# Patient Record
Sex: Male | Born: 1965
Health system: Southern US, Community
[De-identification: ages and names within clinical notes are randomized; demographics above are authoritative.]

## PROBLEM LIST (undated history)

## (undated) DIAGNOSIS — R29898 Other symptoms and signs involving the musculoskeletal system: Secondary | ICD-10-CM

## (undated) DIAGNOSIS — F419 Anxiety disorder, unspecified: Secondary | ICD-10-CM

## (undated) DIAGNOSIS — Z87898 Personal history of other specified conditions: Secondary | ICD-10-CM

## (undated) DIAGNOSIS — R51 Headache: Secondary | ICD-10-CM

## (undated) DIAGNOSIS — Z8719 Personal history of other diseases of the digestive system: Secondary | ICD-10-CM

## (undated) DIAGNOSIS — H5702 Anisocoria: Secondary | ICD-10-CM

## (undated) DIAGNOSIS — K922 Gastrointestinal hemorrhage, unspecified: Secondary | ICD-10-CM

## (undated) DIAGNOSIS — M1612 Unilateral primary osteoarthritis, left hip: Secondary | ICD-10-CM

## (undated) DIAGNOSIS — F32A Depression, unspecified: Secondary | ICD-10-CM

## (undated) DIAGNOSIS — Z8659 Personal history of other mental and behavioral disorders: Secondary | ICD-10-CM

## (undated) DIAGNOSIS — R112 Nausea with vomiting, unspecified: Secondary | ICD-10-CM

## (undated) DIAGNOSIS — M25552 Pain in left hip: Secondary | ICD-10-CM

## (undated) DIAGNOSIS — M1990 Unspecified osteoarthritis, unspecified site: Secondary | ICD-10-CM

## (undated) DIAGNOSIS — Z9889 Other specified postprocedural states: Secondary | ICD-10-CM

## (undated) DIAGNOSIS — M79642 Pain in left hand: Secondary | ICD-10-CM

## (undated) DIAGNOSIS — Z972 Presence of dental prosthetic device (complete) (partial): Secondary | ICD-10-CM

## (undated) DIAGNOSIS — F329 Major depressive disorder, single episode, unspecified: Secondary | ICD-10-CM

## (undated) DIAGNOSIS — F1290 Cannabis use, unspecified, uncomplicated: Secondary | ICD-10-CM

## (undated) DIAGNOSIS — I10 Essential (primary) hypertension: Secondary | ICD-10-CM

## (undated) DIAGNOSIS — M5030 Other cervical disc degeneration, unspecified cervical region: Secondary | ICD-10-CM

## (undated) DIAGNOSIS — G8929 Other chronic pain: Secondary | ICD-10-CM

## (undated) DIAGNOSIS — Z87828 Personal history of other (healed) physical injury and trauma: Secondary | ICD-10-CM

## (undated) DIAGNOSIS — F1120 Opioid dependence, uncomplicated: Secondary | ICD-10-CM

## (undated) DIAGNOSIS — J449 Chronic obstructive pulmonary disease, unspecified: Secondary | ICD-10-CM

## (undated) HISTORY — PX: KNEE SURGERY: SHX244

## (undated) HISTORY — PX: EYE SURGERY: SHX253

## (undated) HISTORY — PX: OTHER SURGICAL HISTORY: SHX169

## (undated) HISTORY — DX: Anisocoria: H57.02

## (undated) NOTE — *Deleted (*Deleted)
Provided patient with homelessness resources and food pantries in the [place] area.  Patient was also provided a bus pass.  Updated EDP with plan

---
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
## 2007-07-28 ENCOUNTER — Emergency Department (HOSPITAL_COMMUNITY): Admission: EM | Admit: 2007-07-28 | Discharge: 2007-07-28 | Payer: Self-pay | Admitting: Emergency Medicine

## 2008-04-17 ENCOUNTER — Emergency Department (HOSPITAL_COMMUNITY): Admission: EM | Admit: 2008-04-17 | Discharge: 2008-04-17 | Payer: Self-pay | Admitting: Emergency Medicine

## 2008-07-27 ENCOUNTER — Emergency Department (HOSPITAL_COMMUNITY): Admission: EM | Admit: 2008-07-27 | Discharge: 2008-07-27 | Payer: Self-pay | Admitting: Emergency Medicine

## 2009-01-11 ENCOUNTER — Emergency Department (HOSPITAL_COMMUNITY): Admission: EM | Admit: 2009-01-11 | Discharge: 2009-01-12 | Payer: Self-pay | Admitting: Emergency Medicine

## 2009-01-12 ENCOUNTER — Ambulatory Visit: Payer: Self-pay | Admitting: Psychiatry

## 2009-01-12 ENCOUNTER — Inpatient Hospital Stay (HOSPITAL_COMMUNITY): Admission: AD | Admit: 2009-01-12 | Discharge: 2009-01-13 | Payer: Self-pay | Admitting: Psychiatry

## 2009-06-15 ENCOUNTER — Emergency Department (HOSPITAL_COMMUNITY): Admission: EM | Admit: 2009-06-15 | Discharge: 2009-06-15 | Payer: Self-pay | Admitting: Emergency Medicine

## 2009-09-25 ENCOUNTER — Emergency Department (HOSPITAL_COMMUNITY): Admission: EM | Admit: 2009-09-25 | Discharge: 2009-09-25 | Payer: Self-pay | Admitting: Emergency Medicine

## 2009-10-18 ENCOUNTER — Emergency Department (HOSPITAL_COMMUNITY): Admission: EM | Admit: 2009-10-18 | Discharge: 2009-10-18 | Payer: Self-pay | Admitting: Emergency Medicine

## 2009-10-22 ENCOUNTER — Emergency Department (HOSPITAL_COMMUNITY): Admission: EM | Admit: 2009-10-22 | Discharge: 2009-10-22 | Payer: Self-pay | Admitting: Emergency Medicine

## 2009-11-10 ENCOUNTER — Emergency Department (HOSPITAL_BASED_OUTPATIENT_CLINIC_OR_DEPARTMENT_OTHER): Admission: EM | Admit: 2009-11-10 | Discharge: 2009-11-10 | Payer: Self-pay | Admitting: Emergency Medicine

## 2009-12-09 ENCOUNTER — Emergency Department (HOSPITAL_COMMUNITY): Admission: EM | Admit: 2009-12-09 | Discharge: 2009-12-09 | Payer: Self-pay | Admitting: Emergency Medicine

## 2009-12-12 ENCOUNTER — Emergency Department (HOSPITAL_COMMUNITY): Admission: EM | Admit: 2009-12-12 | Discharge: 2009-12-12 | Payer: Self-pay | Admitting: Emergency Medicine

## 2009-12-13 ENCOUNTER — Emergency Department (HOSPITAL_COMMUNITY): Admission: EM | Admit: 2009-12-13 | Discharge: 2009-12-13 | Payer: Self-pay | Admitting: Emergency Medicine

## 2010-01-06 ENCOUNTER — Emergency Department (HOSPITAL_COMMUNITY): Admission: EM | Admit: 2010-01-06 | Discharge: 2010-01-06 | Payer: Self-pay | Admitting: Family Medicine

## 2010-01-15 ENCOUNTER — Emergency Department (HOSPITAL_COMMUNITY): Admission: EM | Admit: 2010-01-15 | Discharge: 2010-01-15 | Payer: Self-pay | Admitting: Emergency Medicine

## 2010-02-08 ENCOUNTER — Emergency Department (HOSPITAL_COMMUNITY): Admission: EM | Admit: 2010-02-08 | Discharge: 2010-02-08 | Payer: Self-pay | Admitting: Emergency Medicine

## 2010-03-23 ENCOUNTER — Emergency Department (HOSPITAL_COMMUNITY): Admission: EM | Admit: 2010-03-23 | Discharge: 2010-03-23 | Payer: Self-pay | Admitting: Emergency Medicine

## 2010-04-08 ENCOUNTER — Emergency Department (HOSPITAL_COMMUNITY): Admission: EM | Admit: 2010-04-08 | Discharge: 2010-04-08 | Payer: Self-pay | Admitting: Emergency Medicine

## 2010-04-18 ENCOUNTER — Emergency Department (HOSPITAL_COMMUNITY): Admission: EM | Admit: 2010-04-18 | Discharge: 2010-04-18 | Payer: Self-pay | Admitting: Emergency Medicine

## 2010-04-19 ENCOUNTER — Ambulatory Visit: Payer: Self-pay | Admitting: Psychiatry

## 2010-04-19 ENCOUNTER — Inpatient Hospital Stay (HOSPITAL_COMMUNITY): Admission: AD | Admit: 2010-04-19 | Discharge: 2010-04-20 | Payer: Self-pay | Admitting: Psychiatry

## 2010-04-19 ENCOUNTER — Emergency Department (HOSPITAL_COMMUNITY): Admission: EM | Admit: 2010-04-19 | Discharge: 2010-04-19 | Payer: Self-pay | Admitting: Emergency Medicine

## 2010-06-18 ENCOUNTER — Emergency Department (HOSPITAL_COMMUNITY): Admission: EM | Admit: 2010-06-18 | Discharge: 2010-06-18 | Payer: Self-pay | Admitting: Family Medicine

## 2010-07-01 ENCOUNTER — Emergency Department (HOSPITAL_COMMUNITY): Admission: EM | Admit: 2010-07-01 | Discharge: 2010-07-01 | Payer: Self-pay | Admitting: Emergency Medicine

## 2010-08-03 ENCOUNTER — Emergency Department: Payer: Self-pay | Admitting: Emergency Medicine

## 2010-08-14 ENCOUNTER — Emergency Department (HOSPITAL_COMMUNITY): Admission: EM | Admit: 2010-08-14 | Discharge: 2010-08-14 | Payer: Self-pay | Admitting: Emergency Medicine

## 2010-08-21 ENCOUNTER — Emergency Department (HOSPITAL_COMMUNITY): Admission: EM | Admit: 2010-08-21 | Discharge: 2010-08-21 | Payer: Self-pay | Admitting: Family Medicine

## 2010-09-01 ENCOUNTER — Ambulatory Visit (HOSPITAL_COMMUNITY)
Admission: RE | Admit: 2010-09-01 | Discharge: 2010-09-01 | Payer: Self-pay | Source: Home / Self Care | Admitting: Orthopaedic Surgery

## 2010-10-26 ENCOUNTER — Ambulatory Visit
Admission: RE | Admit: 2010-10-26 | Discharge: 2010-10-26 | Payer: Self-pay | Source: Home / Self Care | Attending: Nurse Practitioner | Admitting: Nurse Practitioner

## 2010-10-26 ENCOUNTER — Encounter (INDEPENDENT_AMBULATORY_CARE_PROVIDER_SITE_OTHER): Payer: Self-pay | Admitting: Internal Medicine

## 2010-10-26 DIAGNOSIS — R519 Headache, unspecified: Secondary | ICD-10-CM | POA: Insufficient documentation

## 2010-10-26 DIAGNOSIS — I10 Essential (primary) hypertension: Secondary | ICD-10-CM | POA: Insufficient documentation

## 2010-10-26 DIAGNOSIS — J309 Allergic rhinitis, unspecified: Secondary | ICD-10-CM | POA: Insufficient documentation

## 2010-10-26 DIAGNOSIS — R51 Headache: Secondary | ICD-10-CM | POA: Insufficient documentation

## 2010-10-26 DIAGNOSIS — M25569 Pain in unspecified knee: Secondary | ICD-10-CM | POA: Insufficient documentation

## 2010-10-26 LAB — CONVERTED CEMR LAB
CO2: 28 meq/L (ref 19–32)
Calcium: 9.7 mg/dL (ref 8.4–10.5)
Chloride: 103 meq/L (ref 96–112)
Glucose, Bld: 82 mg/dL (ref 70–99)
Sodium: 139 meq/L (ref 135–145)

## 2010-10-29 ENCOUNTER — Ambulatory Visit: Admit: 2010-10-29 | Payer: Self-pay | Admitting: Internal Medicine

## 2010-11-04 ENCOUNTER — Telehealth (INDEPENDENT_AMBULATORY_CARE_PROVIDER_SITE_OTHER): Payer: Self-pay | Admitting: Internal Medicine

## 2010-11-04 NOTE — Letter (Signed)
Summary: [employer]ED [employer]ANCE  [employer]   Imported By: Arta Bruce 10/26/2010 14:55:50  _____________________________________________________________________  External Attachment:    Type:   Image     Comment:   External Document

## 2010-11-18 NOTE — Progress Notes (Signed)
Summary: Side effects from labetolol  Phone Note Call from Patient   Summary of Call: pt is having muscle cramps with pain and feeling tired.... pt had a nose bleed... pt just started Labetol (bp med) and he wants to know if the med is the cause of all his symptoms Initial call taken by: [place] Shannon,  November 04, 2010 11:03 AM  Follow-up for Phone Call        Labetolol Rx'd on 1/24.  Side effects include unusual fatigue and bleeding, muscle pain.  Please advise. Left message on answering machine for pt. to return call.  Dutch Quint RN  November 04, 2010 2:46 PM   Additional Follow-up for Phone Call Additional follow up Details #1::        The fatigue, yes--but that should improve over a bit more time.   The nose bleed --unlikely. Need more info on the muscle pain--where on body, when did it start, when does he have it.  What sets it off. Additional Follow-up by: Julieanne Manson MD,  November 05, 2010 6:12 PM    Additional Follow-up for Phone Call Additional follow up Details #2::    Muscle pain in buttocks radiating down to feet 8/10,  hurts worse when relaxed, better when moving around. Pain started after going bowling several months ago. At night 'have to keep my legs moving" if he doesn't keep them moving they will cramp up. Hands "going to sleep" worse when he wakes up in am but does happen when he plays his guitar. Currently taking 2 Percocets in am, 1 Noon, 2 in PM 5 per day. Requesting something stronger or increase the amout of pills. Has been checking BP @ [facility] 168/90, 130/89 yesterday. Advised to decrease salt intake, follow a low fat diet, exercise. Will forward to Dr. Delrae Alfred  pt called to speak with you he states he has more concerns.Marland KitchenMarland KitchenArmenia Shannon  November 09, 2010 10:20 AM  This morning he took last two of pain medication, had some major issues last night with arms, hands, fingers -- feel very badly swollen, but do not appear that swollen.  Legs and buttocks  are badly hurting as well, could hardly get out of bed this morning.  Lost control of his bowels this morning.  Is also having some SOB, still smokes.  Is worried about health status -- got scared when lost control this morning.  Is taking his medications exactly as prescribed.  Sent to Dr. Delrae Alfred.  Dutch Quint RN  November 09, 2010 12:02 PM  He's having a lot of pain, is out of his medication.  Has been trying everything for pain relief without his meds, no relief at all.  Would appreciate refill of pain medication today if possible.    Follow-up by: Dutch Quint RN,  November 10, 2010 10:24 AM  Additional Follow-up for Phone Call Additional follow up Details #3:: Details for Additional Follow-up Action Taken: Pain meds filled 10/26/2010 - pt has not been taking as ordered.  will not approve refill.  will need to wait until Dr. Delrae Alfred reviews on tomorrow for a decision n.martin,fnp  November 10, 2010  11:52 AM  Pt. and wife advised of response -- will send to Dr. Delrae Alfred.  Dutch Quint RN  November 10, 2010 3:45 PM  Pt. called again today -- Needs more pain med. -- Went over the "Agreement for Controlled Substance Presciption" again w/pt. -- Told him that he is to take  his pain meds as directed and that the provider will not refill any pain meds if lost or finished early. -- He is taking 2 pill in the moring, 1 pill at lunch time, and 2 pills in the evening.  -- Adv. pt. that Dr. Delrae Alfred will look over the notes and to wait for our call. -- Pt. agreed and will wait. -- Hale Drone CMA  November 11, 2010 12:48 PM  His percocet must last the entire month--no refills until then. Keep taking the Labetalol Will discuss above discomforts at his follow up beginning of March. Let him know his labs were fine. Julieanne Manson MD  November 12, 2010 8:42 AM   Pt. notified. Gaylyn Cheers RN  November 12, 2010 3:12 PM

## 2010-11-22 ENCOUNTER — Telehealth (INDEPENDENT_AMBULATORY_CARE_PROVIDER_SITE_OTHER): Payer: Self-pay | Admitting: Internal Medicine

## 2010-11-22 DIAGNOSIS — R21 Rash and other nonspecific skin eruption: Secondary | ICD-10-CM | POA: Insufficient documentation

## 2010-11-30 NOTE — Progress Notes (Signed)
Summary: Refill on his Percocet  Phone Note Call from Patient Call back at Home Phone [phone]   Reason for Call: Refill Medication Summary of Call: Trevor Thomas Pt. Trevor Thomas called and wants to know if he can get a refill on his Percocet. He is in a lot of pain and his next appt. is not until March 6th. Initial call taken by: Leodis Rains,  November 22, 2010 3:45 PM  Follow-up for Phone Call        As per previous notes, will refill every 30 days only. Will fill end of this week.  Can pick up Friday morning. Follow-up by: Julieanne Manson MD,  November 24, 2010 12:31 PM    Prescriptions: PERCOCET 5-325 MG TABS (OXYCODONE-ACETAMINOPHEN) 1 tab by mouth every 6 hours as needed for pain  #60 x 0   Entered and Authorized by:   Julieanne Manson MD   Signed by:   Julieanne Manson MD on 11/25/2010   Method used:   Print then Give to Patient   RxID:   [id number]
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
## 2010-12-07 ENCOUNTER — Encounter (INDEPENDENT_AMBULATORY_CARE_PROVIDER_SITE_OTHER): Payer: Self-pay | Admitting: Internal Medicine

## 2010-12-07 ENCOUNTER — Encounter: Payer: Self-pay | Admitting: Internal Medicine

## 2010-12-07 DIAGNOSIS — M5412 Radiculopathy, cervical region: Secondary | ICD-10-CM | POA: Insufficient documentation

## 2010-12-07 DIAGNOSIS — M25519 Pain in unspecified shoulder: Secondary | ICD-10-CM | POA: Insufficient documentation

## 2010-12-09 ENCOUNTER — Telehealth (INDEPENDENT_AMBULATORY_CARE_PROVIDER_SITE_OTHER): Payer: Self-pay | Admitting: Internal Medicine

## 2010-12-14 NOTE — Progress Notes (Signed)
Summary: PT referral  Phone Note Outgoing Call   Summary of Call: Nora--I did not mark down if I had sent pt. for PT --printer was down and I think I filled out a referral paper--but cannot recall.  Would like to refer him to PT--please see order.  Thanks Initial call taken by: Julieanne Manson MD,  December 09, 2010 12:00 PM  Follow-up for Phone Call        SENT A REFERRAL TO [facility] THEY WILL CALL PT TO SCHEDULE AN APPT [address] PH # [phone]  WAITING FOR AN APPT.Marland KitchenCheryll Dessert  December 09, 2010 5:06 PM

## 2010-12-14 NOTE — Assessment & Plan Note (Signed)
Vital Signs:  Patient profile:   45 year old male Weight:      243.1 pounds Temp:     98.3 degrees F oral Pulse rate:   92 / minute Pulse rhythm:   regular Resp:     20 per minute BP sitting:   154 / 96  (left arm) Cuff size:   regular  Vitals Entered By: Levon Hedger (December 07, 2010 4:23 PM) CC: follow-up visit Is Patient Diabetic? No Pain Assessment Patient in pain? yes     Location: lower back Intensity: 8-9  Does patient need assistance? Functional Status Self care Ambulation Normal Comments pt did not bring medications today   CC:  follow-up visit.  History of Present Illness: 1.  Headaches:  still having headaches--maybe a bit better.  Is using reading glasses regularly--has helped.  Is using allergy medications, though still with some throat itching  and nasal congestion.    2.  Allergies:  nose and throat still with some symptoms.  Congestion for the former and itching for the latter.  Did not fill Nasonex as has a $10 copay with our pharmacy.    3.  Hypertension:  Stopped Labetalol--not sure how much of Labetalol he is taking as he states he is taking only 1/2 tab two times a day --could be that he was given 200 mg tabs, but did not bring with him.  Stopped the medication day before yesterday as was having left shoulder pain radiating down his arm-when lies on the shoulder, occurs.  Also happens on right shoulder.   4.  Shoulder pain:  as above.  When presses back of neck, gets tingling down both arms--tingling into hands  Gets pain and tingling when plays guitar as well.  Sounds like this has been a problem intermittently for a while and not just after starting Labetalol.  Allergies (verified): No Known Drug Allergies  Physical Exam  General:  NAD Eyes:  Conjunctivae without injection Ears:  TMs pearly gray Nose:  some clear discharge and nasal mucosal swelling. Mouth:  Throat without injection--not able to see posterior pharynx well. Neck:  No  deformities, masses, or tenderness noted. Lungs:  Normal respiratory effort, chest expands symmetrically. Lungs are clear to auscultation, no crackles or wheezes. Heart:  Normal rate and regular rhythm. S1 and S2 normal without gallop, murmur, click, rub or other extra sounds.  Radial pulses normal and equal Msk:  Tender over traps bilaterally and along cervical paraspinous musculature. Mild crepitation of right shoulder with abduction and flexion.  Full ROM of shoulders bilaterally, but does have discomfort at full abduction/flexion.  Tender over Right AC/CC joint, but not subacromial bursa. Neurologic:  Good grip bilaterallyalert & oriented X3, cranial nerves II-XII intact, strength normal in all extremities, sensation intact to pinprick, and DTRs symmetrical and normal.  Negative Tinel's over median nerve at ventral wrists bilaterally.   Impression & Recommendations:  Problem # 1:  CERVICAL RADICULOPATHY (ICD-723.4)  Xray of cervical spine and shoulders bilaterally--order written as printer nonfunctional.  Orders: Physical Therapy Referral (PT)  Problem # 2:  SHOULDER PAIN, BILATERAL (ICD-719.41)  Suspect some rotator cuff injury on right. His updated medication list for this problem includes:    Percocet 5-325 Mg Tabs (Oxycodone-acetaminophen) .Marland Kitchen... 1 tab by mouth every 6 hours as needed for pain  Orders: Physical Therapy Referral (PT)  Problem # 3:  HEADACHE (ICD-784.0) Again, multifactorial His updated medication list for this problem includes:    Labetalol Hcl 100 Mg  Tabs (Labetalol hcl) .Marland Kitchen... 1 tab by mouth two times a day    Percocet 5-325 Mg Tabs (Oxycodone-acetaminophen) .Marland Kitchen... 1 tab by mouth every 6 hours as needed for pain  Problem # 4:  ALLERGIC RHINITIS (ICD-477.9) To get started on Nasonex. His updated medication list for this problem includes:    Loratadine 10 Mg Tabs (Loratadine) .Marland Kitchen... 1 tab by mouth daily for allergies.    Nasonex 50 Mcg/act Susp (Mometasone  furoate) .Marland Kitchen... 2 sprays each nostril daily  Problem # 5:  HYPERTENSION (ICD-401.9) Discussed I do not believe his radicular symptoms are secondary to the Labetalol and he should never suddenly stop To restart--he is to make sure he is taking 100 mg two times a day --to bring in bottles next visit. His updated medication list for this problem includes:    Labetalol Hcl 100 Mg Tabs (Labetalol hcl) .Marland Kitchen... 1 tab by mouth two times a day  Complete Medication List: 1)  Loratadine 10 Mg Tabs (Loratadine) .Marland Kitchen.. 1 tab by mouth daily for allergies. 2)  Nasonex 50 Mcg/act Susp (Mometasone furoate) .... 2 sprays each nostril daily 3)  Labetalol Hcl 100 Mg Tabs (Labetalol hcl) .Marland Kitchen.. 1 tab by mouth two times a day 4)  Percocet 5-325 Mg Tabs (Oxycodone-acetaminophen) .Marland Kitchen.. 1 tab by mouth every 6 hours as needed for pain  Patient Instructions: 1)  Printer was down and follow up written--I believe to follow up in 4 weeks for BP check and 2-3 months for follow up of rest with Dr. Delrae Alfred Prescriptions: LABETALOL HCL 100 MG TABS (LABETALOL HCL) 1 tab by mouth two times a day  #60 x 6   Entered and Authorized by:   Julieanne Manson MD   Signed by:   Julieanne Manson MD on 12/07/2010   Method used:   Faxed to ...       [facility] - [facility] (retail)       [address] [place], [place]  [address]       Ph: [phone]       Fax: [phone]   RxID:   [id number]    Orders Added: 1)  Est. Patient Level IV [88416] 2)  Physical Therapy Referral [PT]

## 2010-12-16 LAB — DIFFERENTIAL
Basophils Absolute: 0 10*3/uL (ref 0.0–0.1)
Basophils Relative: 0 % (ref 0–1)
Eosinophils Relative: 1 % (ref 0–5)
Lymphocytes Relative: 29 % (ref 12–46)
Lymphs Abs: 2.6 10*3/uL (ref 0.7–4.0)
Monocytes Absolute: 0.6 10*3/uL (ref 0.1–1.0)
Neutrophils Relative %: 63 % (ref 43–77)

## 2010-12-16 LAB — CBC
HCT: 45.1 % (ref 39.0–52.0)
Hemoglobin: 15.9 g/dL (ref 13.0–17.0)
MCH: 32.1 pg (ref 26.0–34.0)
MCHC: 35.3 g/dL (ref 30.0–36.0)
MCV: 90.9 fL (ref 78.0–100.0)
RBC: 4.96 MIL/uL (ref 4.22–5.81)

## 2010-12-16 LAB — BASIC METABOLIC PANEL
CO2: 27 mEq/L (ref 19–32)
Calcium: 9.2 mg/dL (ref 8.4–10.5)
Chloride: 106 mEq/L (ref 96–112)
Glucose, Bld: 117 mg/dL — ABNORMAL HIGH (ref 70–99)
Potassium: 3.9 mEq/L (ref 3.5–5.1)
Sodium: 138 mEq/L (ref 135–145)

## 2010-12-18 LAB — POCT I-STAT, CHEM 8
Hemoglobin: 17 g/dL (ref 13.0–17.0)
Sodium: 141 mEq/L (ref 135–145)
TCO2: 23 mmol/L (ref 0–100)

## 2010-12-18 LAB — RAPID URINE DRUG SCREEN, HOSP PERFORMED
Amphetamines: NOT DETECTED
Tetrahydrocannabinol: POSITIVE — AB

## 2010-12-18 LAB — URINALYSIS, ROUTINE W REFLEX MICROSCOPIC
Bilirubin Urine: NEGATIVE
Hgb urine dipstick: NEGATIVE
Protein, ur: NEGATIVE mg/dL
Urobilinogen, UA: 0.2 mg/dL (ref 0.0–1.0)

## 2010-12-18 LAB — SALICYLATE LEVEL: Salicylate Lvl: <4 mg/dL (ref 2.8–20.0)

## 2010-12-18 LAB — ETHANOL: Alcohol, Ethyl (B): 137 mg/dL — ABNORMAL HIGH (ref 0–10)

## 2010-12-19 LAB — URINALYSIS, ROUTINE W REFLEX MICROSCOPIC
Glucose, UA: NEGATIVE mg/dL
Hgb urine dipstick: NEGATIVE
Ketones, ur: 15 mg/dL — AB
Protein, ur: 100 mg/dL — AB

## 2010-12-19 LAB — URINE MICROSCOPIC-ADD ON

## 2010-12-19 LAB — COMPREHENSIVE METABOLIC PANEL
ALT: 38 U/L (ref 0–53)
AST: 30 U/L (ref 0–37)
CO2: 23 mEq/L (ref 19–32)
Calcium: 10.3 mg/dL (ref 8.4–10.5)
Chloride: 102 mEq/L (ref 96–112)
GFR calc Af Amer: >60 mL/min (ref >60)
GFR calc non Af Amer: >60 mL/min (ref >60)
Sodium: 140 mEq/L (ref 135–145)

## 2010-12-19 LAB — CBC
MCHC: 34.6 g/dL (ref 30.0–36.0)
RBC: 5.47 MIL/uL (ref 4.22–5.81)
WBC: 11.7 10*3/uL — ABNORMAL HIGH (ref 4.0–10.5)

## 2010-12-19 LAB — DIFFERENTIAL
Eosinophils Absolute: 0 10*3/uL (ref 0.0–0.7)
Eosinophils Relative: 0 % (ref 0–5)
Lymphs Abs: 2.5 10*3/uL (ref 0.7–4.0)
Monocytes Absolute: 0.8 10*3/uL (ref 0.1–1.0)

## 2010-12-20 ENCOUNTER — Telehealth (INDEPENDENT_AMBULATORY_CARE_PROVIDER_SITE_OTHER): Payer: Self-pay | Admitting: Internal Medicine

## 2010-12-21 LAB — URINALYSIS, ROUTINE W REFLEX MICROSCOPIC
Ketones, ur: NEGATIVE mg/dL
Protein, ur: NEGATIVE mg/dL
Urobilinogen, UA: 0.2 mg/dL (ref 0.0–1.0)

## 2010-12-23 LAB — CBC
HCT: 48.4 % (ref 39.0–52.0)
Hemoglobin: 16.9 g/dL (ref 13.0–17.0)
MCHC: 35 g/dL (ref 30.0–36.0)
MCV: 93.1 fL (ref 78.0–100.0)
Platelets: 169 K/uL (ref 150–400)
RBC: 5.2 MIL/uL (ref 4.22–5.81)
RDW: 11.5 % (ref 11.5–15.5)
WBC: 9.1 K/uL (ref 4.0–10.5)

## 2010-12-23 LAB — COMPREHENSIVE METABOLIC PANEL
ALT: 27 U/L (ref 0–53)
CO2: 29 mEq/L (ref 19–32)
Calcium: 9.4 mg/dL (ref 8.4–10.5)
Creatinine, Ser: 0.9 mg/dL (ref 0.4–1.5)
GFR calc Af Amer: >60 mL/min (ref >60)
GFR calc non Af Amer: >60 mL/min (ref >60)
Glucose, Bld: 102 mg/dL — ABNORMAL HIGH (ref 70–99)
Sodium: 140 mEq/L (ref 135–145)
Total Protein: 6.8 g/dL (ref 6.0–8.3)

## 2010-12-23 LAB — URINALYSIS, ROUTINE W REFLEX MICROSCOPIC
Bilirubin Urine: NEGATIVE
Glucose, UA: NEGATIVE mg/dL
Hgb urine dipstick: NEGATIVE
Ketones, ur: NEGATIVE mg/dL
Nitrite: NEGATIVE
Protein, ur: NEGATIVE mg/dL
Specific Gravity, Urine: 1.027 (ref 1.005–1.030)
Urobilinogen, UA: 1 mg/dL (ref 0.0–1.0)
pH: 6 (ref 5.0–8.0)

## 2010-12-23 LAB — DIFFERENTIAL
Basophils Absolute: 0.3 K/uL — ABNORMAL HIGH (ref 0.0–0.1)
Basophils Relative: 3 % — ABNORMAL HIGH (ref 0–1)
Eosinophils Absolute: 0.1 10*3/uL (ref 0.0–0.7)
Eosinophils Relative: 2 % (ref 0–5)
Lymphocytes Relative: 33 % (ref 12–46)
Lymphs Abs: 3 10*3/uL (ref 0.7–4.0)
Monocytes Absolute: 0.8 K/uL (ref 0.1–1.0)
Monocytes Relative: 9 % (ref 3–12)
Neutro Abs: 4.9 K/uL (ref 1.7–7.7)
Neutrophils Relative %: 53 % (ref 43–77)

## 2010-12-23 LAB — COMPREHENSIVE METABOLIC PANEL WITH GFR
AST: 25 U/L (ref 0–37)
Albumin: 4.1 g/dL (ref 3.5–5.2)
Alkaline Phosphatase: 69 U/L (ref 39–117)
BUN: 19 mg/dL (ref 6–23)
Chloride: 105 meq/L (ref 96–112)
Potassium: 3.7 meq/L (ref 3.5–5.1)
Total Bilirubin: 0.6 mg/dL (ref 0.3–1.2)

## 2010-12-23 LAB — POCT TOXICOLOGY PANEL
Cocaine: POSITIVE
Tetrahydrocannabinol: POSITIVE

## 2010-12-23 LAB — ETHANOL: Alcohol, Ethyl (B): <5 mg/dL (ref 0–10)

## 2010-12-29 ENCOUNTER — Encounter (INDEPENDENT_AMBULATORY_CARE_PROVIDER_SITE_OTHER): Payer: Self-pay | Admitting: *Deleted

## 2010-12-30 ENCOUNTER — Emergency Department (HOSPITAL_COMMUNITY)
Admission: EM | Admit: 2010-12-30 | Discharge: 2010-12-31 | Disposition: A | Discharge status: Home / Self Care | Payer: Self-pay | Attending: Emergency Medicine | Admitting: Emergency Medicine

## 2010-12-30 DIAGNOSIS — F1910 Other psychoactive substance abuse, uncomplicated: Secondary | ICD-10-CM | POA: Insufficient documentation

## 2010-12-30 DIAGNOSIS — F329 Major depressive disorder, single episode, unspecified: Secondary | ICD-10-CM | POA: Insufficient documentation

## 2010-12-30 DIAGNOSIS — F1410 Cocaine abuse, uncomplicated: Secondary | ICD-10-CM | POA: Insufficient documentation

## 2010-12-30 DIAGNOSIS — F3289 Other specified depressive episodes: Secondary | ICD-10-CM | POA: Insufficient documentation

## 2010-12-30 DIAGNOSIS — I10 Essential (primary) hypertension: Secondary | ICD-10-CM | POA: Insufficient documentation

## 2010-12-30 DIAGNOSIS — F1010 Alcohol abuse, uncomplicated: Secondary | ICD-10-CM | POA: Insufficient documentation

## 2010-12-30 DIAGNOSIS — F1210 Cannabis abuse, uncomplicated: Secondary | ICD-10-CM | POA: Insufficient documentation

## 2010-12-30 LAB — DIFFERENTIAL
Basophils Absolute: 0 10*3/uL (ref 0.0–0.1)
Basophils Relative: 0 % (ref 0–1)
Eosinophils Absolute: 0.1 10*3/uL (ref 0.0–0.7)
Eosinophils Relative: 1 % (ref 0–5)
Lymphocytes Relative: 27 % (ref 12–46)
Lymphs Abs: 2.3 K/uL (ref 0.7–4.0)
Monocytes Absolute: 0.4 K/uL (ref 0.1–1.0)
Monocytes Relative: 4 % (ref 3–12)
Neutro Abs: 5.6 10*3/uL (ref 1.7–7.7)
Neutrophils Relative %: 68 % (ref 43–77)

## 2010-12-30 LAB — CBC
HCT: 46.3 % (ref 39.0–52.0)
Hemoglobin: 16.2 g/dL (ref 13.0–17.0)
MCH: 31.6 pg (ref 26.0–34.0)
MCHC: 35 g/dL (ref 30.0–36.0)
MCV: 90.4 fL (ref 78.0–100.0)
Platelets: 242 10*3/uL (ref 150–400)
RBC: 5.12 MIL/uL (ref 4.22–5.81)
RDW: 12.4 % (ref 11.5–15.5)
WBC: 8.3 K/uL (ref 4.0–10.5)

## 2010-12-30 LAB — ETHANOL: Alcohol, Ethyl (B): 47 mg/dL — ABNORMAL HIGH (ref 0–10)

## 2010-12-30 LAB — BASIC METABOLIC PANEL WITH GFR
CO2: 23 meq/L (ref 19–32)
Calcium: 9.5 mg/dL (ref 8.4–10.5)
Chloride: 103 meq/L (ref 96–112)
Glucose, Bld: 92 mg/dL (ref 70–99)
Sodium: 138 meq/L (ref 135–145)

## 2010-12-30 LAB — RAPID URINE DRUG SCREEN, HOSP PERFORMED
Amphetamines: NOT DETECTED
Barbiturates: NOT DETECTED
Benzodiazepines: NOT DETECTED
Cocaine: POSITIVE — AB
Opiates: NOT DETECTED
Tetrahydrocannabinol: POSITIVE — AB

## 2010-12-30 LAB — URINALYSIS, ROUTINE W REFLEX MICROSCOPIC
Bilirubin Urine: NEGATIVE
Glucose, UA: NEGATIVE mg/dL
Hgb urine dipstick: NEGATIVE
Ketones, ur: NEGATIVE mg/dL
Nitrite: NEGATIVE
Protein, ur: NEGATIVE mg/dL
Specific Gravity, Urine: 1.016 (ref 1.005–1.030)
Urobilinogen, UA: 0.2 mg/dL (ref 0.0–1.0)
pH: 5 (ref 5.0–8.0)

## 2010-12-30 LAB — BASIC METABOLIC PANEL
BUN: 11 mg/dL (ref 6–23)
Creatinine, Ser: 0.8 mg/dL (ref 0.4–1.5)
GFR calc Af Amer: >60 mL/min (ref >60)
GFR calc non Af Amer: >60 mL/min (ref >60)
Potassium: 4 mEq/L (ref 3.5–5.1)

## 2010-12-30 NOTE — Progress Notes (Signed)
Summary: Percocet refill  Phone Note Call from Patient   Summary of Call: Pt.'s wife called for pt. -- needs refill of Percocet. Initial call taken by: Dutch Quint RN,  December 20, 2010 12:15 PM  Follow-up for Phone Call        May pick up tomorrow morning  Julieanne Manson MD  December 23, 2010 6:14 AM  Follow-up by: Julieanne Manson MD,  December 23, 2010 6:14 AM  Additional Follow-up for Phone Call Additional follow up Details #1::        Left message on answer machine for pt. to return call. Gaylyn Cheers RN  December 23, 2010 11:18 AM   Pt. returned call stated his wife had already picked the script up. Gaylyn Cheers RN  December 23, 2010 12:08 PM      Prescriptions: PERCOCET 5-325 MG TABS (OXYCODONE-ACETAMINOPHEN) 1 tab by mouth every 6 hours as needed for pain  #60 x 0   Entered and Authorized by:   Julieanne Manson MD   Signed by:   Julieanne Manson MD on 12/23/2010   Method used:   Print then Give to Patient   RxID:   [id number]

## 2010-12-31 DIAGNOSIS — F1910 Other psychoactive substance abuse, uncomplicated: Secondary | ICD-10-CM

## 2011-01-04 NOTE — Letter (Signed)
Summary: *HSN Results Follow up  [facility]  [address] [place], [place] [address]   Phone: [phone]  Fax: [phone]      12/29/2010   ALDRICK DERRIG [address] [place], [place]  [address]   Dear  Mr. LAMIR RACCA,                            Comments:  Physical Therapy is been tryimg to get in contact with you . Please, call  to schedule an appointment at your earliest convenience phone # [phone] address [address]  Thank you       _________________________________________________________ If you have any questions, please contact our office                     Sincerely,  Cheryll Dessert [facility]

## 2011-01-07 LAB — CBC
HCT: 45.5 % (ref 39.0–52.0)
Hemoglobin: 15.8 g/dL (ref 13.0–17.0)
MCHC: 34.6 g/dL (ref 30.0–36.0)
Platelets: 196 10*3/uL (ref 150–400)
RDW: 13.4 % (ref 11.5–15.5)

## 2011-01-07 LAB — RAPID URINE DRUG SCREEN, HOSP PERFORMED
Benzodiazepines: NOT DETECTED
Cocaine: NOT DETECTED
Opiates: NOT DETECTED
Tetrahydrocannabinol: POSITIVE — AB

## 2011-01-07 LAB — BASIC METABOLIC PANEL
BUN: 15 mg/dL (ref 6–23)
CO2: 27 mEq/L (ref 19–32)
Calcium: 9.4 mg/dL (ref 8.4–10.5)
GFR calc non Af Amer: >60 mL/min (ref >60)
Glucose, Bld: 89 mg/dL (ref 70–99)
Sodium: 142 mEq/L (ref 135–145)

## 2011-01-07 LAB — DIFFERENTIAL
Basophils Absolute: 0 10*3/uL (ref 0.0–0.1)
Basophils Relative: 0 % (ref 0–1)
Eosinophils Relative: 2 % (ref 0–5)
Monocytes Absolute: 0.5 10*3/uL (ref 0.1–1.0)
Neutro Abs: 6.5 10*3/uL (ref 1.7–7.7)

## 2011-01-07 LAB — TRICYCLICS SCREEN, URINE: TCA Scrn: NOT DETECTED

## 2011-01-12 LAB — CBC
HCT: 48.7 % (ref 39.0–52.0)
Hemoglobin: 16.8 g/dL (ref 13.0–17.0)
MCV: 93.1 fL (ref 78.0–100.0)
RBC: 5.23 MIL/uL (ref 4.22–5.81)
WBC: 7.9 10*3/uL (ref 4.0–10.5)

## 2011-01-12 LAB — DIFFERENTIAL
Eosinophils Absolute: 0.1 10*3/uL (ref 0.0–0.7)
Eosinophils Relative: 1 % (ref 0–5)
Lymphs Abs: 3.1 10*3/uL (ref 0.7–4.0)
Monocytes Absolute: 0.4 10*3/uL (ref 0.1–1.0)
Monocytes Relative: 6 % (ref 3–12)
Neutrophils Relative %: 53 % (ref 43–77)

## 2011-01-12 LAB — ETHANOL: Alcohol, Ethyl (B): 95 mg/dL — ABNORMAL HIGH (ref 0–10)

## 2011-01-12 LAB — BASIC METABOLIC PANEL
CO2: 27 mEq/L (ref 19–32)
Chloride: 104 mEq/L (ref 96–112)
GFR calc Af Amer: >60 mL/min (ref >60)
Potassium: 3.9 mEq/L (ref 3.5–5.1)

## 2011-01-12 LAB — RAPID URINE DRUG SCREEN, HOSP PERFORMED: Barbiturates: NOT DETECTED

## 2011-02-01 ENCOUNTER — Emergency Department (HOSPITAL_COMMUNITY)
Admission: EM | Admit: 2011-02-01 | Discharge: 2011-02-01 | Disposition: A | Discharge status: Home / Self Care | Payer: Self-pay | Attending: Emergency Medicine | Admitting: Emergency Medicine

## 2011-02-01 DIAGNOSIS — I10 Essential (primary) hypertension: Secondary | ICD-10-CM | POA: Insufficient documentation

## 2011-02-01 DIAGNOSIS — R21 Rash and other nonspecific skin eruption: Secondary | ICD-10-CM | POA: Insufficient documentation

## 2011-02-01 DIAGNOSIS — R0602 Shortness of breath: Secondary | ICD-10-CM | POA: Insufficient documentation

## 2011-02-01 DIAGNOSIS — S30860A Insect bite (nonvenomous) of lower back and pelvis, initial encounter: Secondary | ICD-10-CM | POA: Insufficient documentation

## 2011-02-01 DIAGNOSIS — R5381 Other malaise: Secondary | ICD-10-CM | POA: Insufficient documentation

## 2011-02-01 DIAGNOSIS — W57XXXA Bitten or stung by nonvenomous insect and other nonvenomous arthropods, initial encounter: Secondary | ICD-10-CM | POA: Insufficient documentation

## 2011-02-01 DIAGNOSIS — R5383 Other fatigue: Secondary | ICD-10-CM | POA: Insufficient documentation

## 2011-02-01 DIAGNOSIS — F329 Major depressive disorder, single episode, unspecified: Secondary | ICD-10-CM | POA: Insufficient documentation

## 2011-02-01 DIAGNOSIS — F3289 Other specified depressive episodes: Secondary | ICD-10-CM | POA: Insufficient documentation

## 2011-02-01 DIAGNOSIS — IMO0001 Reserved for inherently not codable concepts without codable children: Secondary | ICD-10-CM | POA: Insufficient documentation

## 2011-02-01 DIAGNOSIS — R109 Unspecified abdominal pain: Secondary | ICD-10-CM | POA: Insufficient documentation

## 2011-02-15 NOTE — Consult Note (Signed)
NAMEENMANUEL, ZUFALL NO.:  [id number]   MEDICAL RECORD NO.:  [id number]          PATIENT TYPE:  EMS   LOCATION:  ED                           FACILITY:  [facility]   PHYSICIAN:  Antonietta Breach, M.D.  DATE OF BIRTH:  Feb 09, 1966   DATE OF CONSULTATION:  01/12/2009  DATE OF DISCHARGE:                                 CONSULTATION   REQUESTING PHYSICIAN:  [facility].   REASON FOR CONSULTATION:  Anxiety, depression, cocaine abuse.   HISTORY OF PRESENT ILLNESS:  Mr. Salway is a 45 year old male  presenting to the [facility] under involuntary commitment with  severe depressed mood, suicidal ideation.   He has had recent stresses of a breakup with his girlfriend as well as  being homeless.  He also has been using cocaine.  He has endorsed  depressed mood, suicidal ideation.  He has been cooperative and  redirectable.  His energy is decreased.   PAST PSYCHIATRIC HISTORY:  Mr. Laredo does have a history of alcohol  abuse as well as marijuana abuse.   Mr. Monte was admitted to an inpatient behavioral facility in  [place] in 2008 for detox.   Mr. Pafford is currently under involuntary commitment.   SOCIAL HISTORY:  Divorced, homeless.   PAST MEDICAL HISTORY:  History of left hand surgery.   No known drug allergies.   LABORATORY DATA:  Urine drug screen positive for cocaine.   VITAL SIGNS:  Temperature 98.1, pulse 93, respiratory rate 18, blood  pressure 122/74.   MENTAL STATUS EXAM:  Please see the history of present illness.  Mr.  Saxer has coherent thought process.  He is oriented to all spheres.  His affect is slightly anxious.  Thought content:  Please see the  history of present illness.   He maintains good eye contact.  His speech is normal without slurring.  Concentration mildly decreased.  Mood depressed.   ASSESSMENT:  Axis I:  293.83, mood disorder not otherwise specified,  depressed.  Polysubstance dependence  Axis II:  Deferred.  Axis III:  Please see past medical history.  Axis IV:  Primary support group.  Axis V:  30.   Mr. Degroff requires inpatient psychiatric care for dual diagnosis  treatment.   RECOMMENDATIONS:  For anti-acute anxiety and insomnia, would utilize 25-  50 mg of Vistaril p.o. q.i.d. p.r.n.   Will defer other psychotropic medication that require several days to  work to the inpatient psychiatric unit.      Antonietta Breach, M.D.  Electronically Signed     JW/MEDQ  D:  01/12/2009  T:  01/12/2009  Job:  [id number]   cc:   [facility]

## 2011-02-17 ENCOUNTER — Ambulatory Visit (INDEPENDENT_AMBULATORY_CARE_PROVIDER_SITE_OTHER): Payer: Self-pay

## 2011-02-17 ENCOUNTER — Inpatient Hospital Stay (INDEPENDENT_AMBULATORY_CARE_PROVIDER_SITE_OTHER)
Admission: RE | Admit: 2011-02-17 | Discharge: 2011-02-17 | Disposition: A | Discharge status: Home / Self Care | Payer: Self-pay | Source: Ambulatory Visit | Attending: Family Medicine | Admitting: Family Medicine

## 2011-02-17 DIAGNOSIS — M25569 Pain in unspecified knee: Secondary | ICD-10-CM

## 2011-02-17 DIAGNOSIS — R300 Dysuria: Secondary | ICD-10-CM

## 2011-02-17 LAB — POCT I-STAT, CHEM 8
Calcium, Ion: 1.2 mmol/L (ref 1.12–1.32)
Chloride: 104 mEq/L (ref 96–112)
Glucose, Bld: 88 mg/dL (ref 70–99)
HCT: 55 % — ABNORMAL HIGH (ref 39.0–52.0)
TCO2: 27 mmol/L (ref 0–100)

## 2011-02-17 LAB — POCT URINALYSIS DIP (DEVICE)
Bilirubin Urine: NEGATIVE
Hgb urine dipstick: NEGATIVE
Nitrite: POSITIVE — AB
Protein, ur: NEGATIVE mg/dL
pH: 5 (ref 5.0–8.0)

## 2011-02-18 LAB — URINE CULTURE
Colony Count: NO GROWTH
Culture: NO GROWTH

## 2011-02-18 NOTE — Discharge Summary (Signed)
Trevor Thomas, Trevor Thomas   MEDICAL RECORD NO.:  [id number]          PATIENT TYPE:  IPS   LOCATION:  0506                          FACILITY:  [facility]   PHYSICIAN:  Geoffery Lyons, M.D.      DATE OF BIRTH:  [birth year]   DATE OF ADMISSION:  01/12/2009  DATE OF DISCHARGE:  01/13/2009                               DISCHARGE SUMMARY   IDENTIFYING INFORMATION:  This is a 45 year old male.  This was an  involuntary admission.   HISTORY OF PRESENT ILLNESS:  [facility] admission for this 45 year old  who initially presented on January 11, 2009 by private vehicle to the  emergency room complaining of some suicidal thoughts and thought that  maybe he would just go ahead and jump off a bridge. He had no prior  history of suicidal thoughts.  He said that he had been smoking  marijuana and had used a small amount of crack along with some alcohol  on the previous night before presentation after having been completely  abstinent from substances for about 5 months.  He was complaining of a  lot of anxiety with some agitation and said he was going through a  stressful time. He says that his girlfriend is pregnant and was driving  him crazy, that he had already lost his house and feared because of  what he was going through with her that he was going to end up losing  his job. He felt that he was having trouble keeping up the relationship  because of her demands and fearing that he was going to lose his job.  Currently working [employer]. Had been having issues with lack of  work. Then when he could get work he had difficulty with transportation  issues getting to work, having some conflict with the girlfriend's  family, specifically tension between himself and the girlfriend's  mother. Previously was divorced many years ago and had 3 sons whom he  does not live with. He received Vistaril and some Ativan in the  emergency room. Denied chronic drug abuse.  He was transferred to  our  service on January 12, 2009 and was first seen on the morning of April 13.  At that time he was, coherent.  Insight good.  Felt that he had time to  reflect on his situation.  Promising safety. Having no suicidal  thoughts.  Felt that he had needed to get away from the stress. Was  requesting to follow up with outpatient treatment.   MENTAL STATUS EXAM:  Fully alert male, cooperative, good eye contact.  Hygiene and grooming are appropriate. Affect appropriate.  Speech  normal.  Gives a coherent history.  Thinking is logical, coherent.  No  dangerous ideas today. He is philosophical good insight into the stress  that he has been under. Feels that he needs to probably limit some of  the communication that he has had with the family members, try to focus  on his job. Wanting to follow up with outpatient counseling and willing  to go to [facility]. No evidence of homicidal  thoughts.  No psychosis.  Thinking is linear, logical.  Insight  adequate.  Cognition fully preserved.   DISCHARGE DIAGNOSIS:  AXIS I: Acute adjustment disorder, not otherwise  specified.  AXIS II: No diagnosis.  AXIS III: No diagnosis.  AXIS IV: Significant stressors.  Significant relationship and parenting  stressors.  AXIS V: Current 58, past year 68 estimated.   Plan was to discharge him today. He has been referred to Dr. Lang Snow at  [facility] on Thursday January 22, 2009 at 10 a.m. area  code [phone].  He was discharged on no medications.      Margaret A. Scott, N.P.      Geoffery Lyons, M.D.  Electronically Signed    MAS/MEDQ  D:  01/15/2009  T:  01/15/2009  Job:  [id number]

## 2011-04-17 ENCOUNTER — Emergency Department (HOSPITAL_COMMUNITY): Payer: Self-pay

## 2011-04-17 ENCOUNTER — Emergency Department (HOSPITAL_COMMUNITY)
Admission: EM | Admit: 2011-04-17 | Discharge: 2011-04-17 | Disposition: A | Discharge status: Home / Self Care | Payer: Self-pay | Attending: Emergency Medicine | Admitting: Emergency Medicine

## 2011-04-17 DIAGNOSIS — K5900 Constipation, unspecified: Secondary | ICD-10-CM | POA: Insufficient documentation

## 2011-04-17 DIAGNOSIS — F329 Major depressive disorder, single episode, unspecified: Secondary | ICD-10-CM | POA: Insufficient documentation

## 2011-04-17 DIAGNOSIS — F3289 Other specified depressive episodes: Secondary | ICD-10-CM | POA: Insufficient documentation

## 2011-04-17 DIAGNOSIS — J45909 Unspecified asthma, uncomplicated: Secondary | ICD-10-CM | POA: Insufficient documentation

## 2011-04-17 DIAGNOSIS — R109 Unspecified abdominal pain: Secondary | ICD-10-CM | POA: Insufficient documentation

## 2011-04-17 DIAGNOSIS — I10 Essential (primary) hypertension: Secondary | ICD-10-CM | POA: Insufficient documentation

## 2011-07-01 LAB — POCT I-STAT, CHEM 8
BUN: 21
Calcium, Ion: 1.15
Chloride: 103
Creatinine, Ser: 1
Glucose, Bld: 84
HCT: 43
Hemoglobin: 14.6
Potassium: 3.5
Sodium: 140
TCO2: 28

## 2011-07-05 LAB — RAPID URINE DRUG SCREEN, HOSP PERFORMED
Cocaine: POSITIVE — AB
Opiates: NOT DETECTED

## 2011-07-05 LAB — CBC
HCT: 47.1
Hemoglobin: 15.8
MCHC: 33.6
RDW: 13

## 2011-07-05 LAB — DIFFERENTIAL
Basophils Absolute: 0
Basophils Relative: 1
Eosinophils Relative: 2
Monocytes Absolute: 0.5

## 2011-07-05 LAB — POCT I-STAT, CHEM 8
Calcium, Ion: 1.16
HCT: 49
TCO2: 30

## 2011-07-13 LAB — COMPREHENSIVE METABOLIC PANEL
AST: 37
Albumin: 4
Alkaline Phosphatase: 92
Chloride: 105
GFR calc Af Amer: >60
Potassium: 3.8
Total Bilirubin: 1.1

## 2011-07-13 LAB — DIFFERENTIAL
Basophils Absolute: 0.1
Basophils Relative: 1
Eosinophils Relative: 1
Monocytes Absolute: 0.7

## 2011-07-13 LAB — RAPID URINE DRUG SCREEN, HOSP PERFORMED
Amphetamines: NOT DETECTED
Barbiturates: NOT DETECTED

## 2011-07-13 LAB — CBC
Platelets: 264
WBC: 10

## 2011-07-13 LAB — URINALYSIS, ROUTINE W REFLEX MICROSCOPIC
Hgb urine dipstick: NEGATIVE
Leukocytes, UA: NEGATIVE
Specific Gravity, Urine: 1.039 — ABNORMAL HIGH
Urobilinogen, UA: 1

## 2011-07-13 LAB — URINE MICROSCOPIC-ADD ON

## 2011-07-13 LAB — ETHANOL: Alcohol, Ethyl (B): 5

## 2011-08-29 ENCOUNTER — Emergency Department (HOSPITAL_COMMUNITY)
Admission: EM | Admit: 2011-08-29 | Discharge: 2011-08-29 | Disposition: A | Discharge status: Home / Self Care | Payer: Self-pay

## 2011-09-02 ENCOUNTER — Emergency Department (HOSPITAL_COMMUNITY)
Admission: EM | Admit: 2011-09-02 | Discharge: 2011-09-03 | Disposition: A | Discharge status: Home / Self Care | Payer: Self-pay

## 2011-10-20 ENCOUNTER — Other Ambulatory Visit: Payer: Self-pay | Admitting: Internal Medicine

## 2011-10-20 DIAGNOSIS — M25562 Pain in left knee: Secondary | ICD-10-CM

## 2011-10-26 ENCOUNTER — Ambulatory Visit (HOSPITAL_COMMUNITY)
Admission: RE | Admit: 2011-10-26 | Discharge: 2011-10-26 | Disposition: A | Discharge status: Home / Self Care | Payer: Self-pay | Source: Ambulatory Visit | Attending: Internal Medicine | Admitting: Internal Medicine

## 2011-10-26 DIAGNOSIS — M25562 Pain in left knee: Secondary | ICD-10-CM

## 2011-10-26 DIAGNOSIS — M2240 Chondromalacia patellae, unspecified knee: Secondary | ICD-10-CM | POA: Insufficient documentation

## 2011-10-26 DIAGNOSIS — M25469 Effusion, unspecified knee: Secondary | ICD-10-CM | POA: Insufficient documentation

## 2011-11-10 ENCOUNTER — Emergency Department (HOSPITAL_COMMUNITY)
Admission: EM | Admit: 2011-11-10 | Discharge: 2011-11-10 | Disposition: A | Discharge status: Home / Self Care | Payer: Self-pay | Attending: Emergency Medicine | Admitting: Emergency Medicine

## 2011-11-10 ENCOUNTER — Emergency Department (HOSPITAL_COMMUNITY): Payer: Self-pay

## 2011-11-10 ENCOUNTER — Other Ambulatory Visit: Payer: Self-pay

## 2011-11-10 DIAGNOSIS — IMO0001 Reserved for inherently not codable concepts without codable children: Secondary | ICD-10-CM | POA: Insufficient documentation

## 2011-11-10 DIAGNOSIS — F411 Generalized anxiety disorder: Secondary | ICD-10-CM | POA: Insufficient documentation

## 2011-11-10 DIAGNOSIS — M545 Low back pain, unspecified: Secondary | ICD-10-CM | POA: Insufficient documentation

## 2011-11-10 DIAGNOSIS — M25559 Pain in unspecified hip: Secondary | ICD-10-CM | POA: Insufficient documentation

## 2011-11-10 DIAGNOSIS — M79609 Pain in unspecified limb: Secondary | ICD-10-CM | POA: Insufficient documentation

## 2011-11-10 DIAGNOSIS — G8929 Other chronic pain: Secondary | ICD-10-CM | POA: Insufficient documentation

## 2011-11-10 DIAGNOSIS — J40 Bronchitis, not specified as acute or chronic: Secondary | ICD-10-CM | POA: Insufficient documentation

## 2011-11-10 DIAGNOSIS — R0789 Other chest pain: Secondary | ICD-10-CM

## 2011-11-10 DIAGNOSIS — R071 Chest pain on breathing: Secondary | ICD-10-CM | POA: Insufficient documentation

## 2011-11-10 LAB — POCT I-STAT TROPONIN I

## 2011-11-10 LAB — BASIC METABOLIC PANEL
BUN: 18 mg/dL (ref 6–23)
Calcium: 9.6 mg/dL (ref 8.4–10.5)
GFR calc Af Amer: >90 mL/min (ref >90)
GFR calc non Af Amer: >90 mL/min (ref >90)
Potassium: 4.2 mEq/L (ref 3.5–5.1)

## 2011-11-10 LAB — CBC
HCT: 45.9 % (ref 39.0–52.0)
MCHC: 34 g/dL (ref 30.0–36.0)
RDW: 12.9 % (ref 11.5–15.5)

## 2011-11-10 MED ORDER — MORPHINE SULFATE 4 MG/ML IJ SOLN
4.0000 mg | Freq: Once | INTRAMUSCULAR | Status: AC
  Administered 2011-11-10: 4 mg via INTRAVENOUS
  Filled 2011-11-10: qty 1

## 2011-11-10 MED ORDER — ALBUTEROL SULFATE HFA 108 (90 BASE) MCG/ACT IN AERS
2.0000 | INHALATION_SPRAY | RESPIRATORY_TRACT | Status: DC | PRN
  Administered 2011-11-10: 2 via RESPIRATORY_TRACT
  Filled 2011-11-10: qty 6.7

## 2011-11-10 MED ORDER — ONDANSETRON HCL 4 MG/2ML IJ SOLN
4.0000 mg | Freq: Once | INTRAMUSCULAR | Status: AC
Start: 2011-11-10 — End: 2011-11-10
  Administered 2011-11-10: 4 mg via INTRAVENOUS
  Filled 2011-11-10: qty 2

## 2011-11-10 MED ORDER — ASPIRIN 81 MG PO CHEW
324.0000 mg | CHEWABLE_TABLET | Freq: Once | ORAL | Status: AC
  Administered 2011-11-10: 324 mg via ORAL
  Filled 2011-11-10: qty 4

## 2011-11-10 NOTE — ED Provider Notes (Signed)
History     CSN: [id number]  Arrival date & time 11/10/11  8469   First MD Initiated Contact with Patient 11/10/11 0848      Chief Complaint  Patient presents with  . Chest Pain  . Anxiety  . Medication Refill  . Leg Pain    (Consider location/radiation/quality/duration/timing/severity/associated sxs/prior treatment) HPI Patient presenting with multiple complaints including 1 small area of sharp pain in the left side of his lateral chest wall. He also describes symptoms of anxiety. He also describes diffuse body pain including low back and knee pain which is chronic in nature. He states that he recently started a new job which required more physical exertion than he is used to and feels that his chronic body pain is worse as a result. He also describes mild cough and wheezing which began several days ago and he ran out of his albuterol. He doesn't worse smoking cigarettes. He denies any leg swelling, no recent travel, no recent surgery or trauma. His chest pain is reproducible with palpation and worse with deep breathing. Patient is requesting pain medication. He states that he receives 60 Percocet every 30 days and usually run out several days early. He currently is holding a prescription from his primary doctor with a note which states it should not be filled until February 11. There no other associated systemic symptoms. There no other alleviating or modifying factors.  No past medical history on file.  No past surgical history on file.  No family history on file.  History  Substance Use Topics  . Smoking status: Not on file  . Smokeless tobacco: Not on file  . Alcohol Use: Not on file      Review of Systems ROS reviewed and otherwise negative except for mentioned in HPI  Allergies  Review of patient's allergies indicates no known allergies.  Home Medications   Current Outpatient Rx  Name Route Sig Dispense Refill  . IBUPROFEN 600 MG PO TABS Oral Take 600 mg by mouth  every 6 (six) hours as needed. For pain    . OXYCODONE-ACETAMINOPHEN 5-325 MG PO TABS Oral Take 2 tablets by mouth 2 (two) times daily.      BP 156/74  Pulse 89  Temp(Src) 97.5 F (36.4 C) (Oral)  Resp 20  SpO2 97% Vitals reviewed Physical Exam Physical Examination: General appearance - alert, well appearing, and in no distress Mental status - alert, oriented to person, place, and time Mouth - mucous membranes moist, pharynx normal without lesions Chest - clear to auscultation, no wheezes, rales or rhonchi, symmetric air entry, approx 1cm area of tenderness overlying left lateral chest wall at midaxillary line Heart - normal rate, regular rhythm, normal S1, S2, no murmurs, rubs, clicks or gallops Neurological - alert, oriented, normal speech, no focal findings or movement disorder noted Musculoskeletal - no joint tenderness, deformity or swelling Extremities - peripheral pulses normal, no pedal edema, no clubbing or cyanosis Skin - normal coloration and turgor, no rashes  ED Course  Procedures (including critical care time)   Date: 11/10/2011  Rate: 83  Rhythm: normal sinus rhythm  QRS Axis: normal  Intervals: normal  ST/T Wave abnormalities: normal  Conduction Disutrbances:none  Narrative Interpretation:   Old EKG Reviewed: changes noted, rate slower compared to prior EKG of 03/23/10    Labs Reviewed  BASIC METABOLIC PANEL - Abnormal; Notable for the following:    Glucose, Bld 108 (*)    All other components within normal limits  CBC  D-DIMER, QUANTITATIVE  POCT I-STAT TROPONIN I  POCT I-STAT TROPONIN I  LAB REPORT - SCANNED   Dg Chest 2 View  11/10/2011  *RADIOLOGY REPORT*  Clinical Data: Chest pain, leg pain, anxiety  CHEST - 2 VIEW  Comparison: 08/14/2010  Findings: Cardiomediastinal silhouette is stable.  No acute infiltrate or pulmonary edema.  Stable atelectasis or scarring left base.  Minimal degenerative changes mid thoracic spine.  IMPRESSION: No active  disease.  No significant change.  Original Report Authenticated By: Natasha Mead, M.D.     1. Chest wall pain   2. Bronchitis       MDM  Patient presents with multiple complaints including chronic body pain, one area of chest wall pain which is worse with inspiration. His workup today includes a normal EKG, reassuring labs including normal d-dimer and 2 sets of troponin. I suspect this pain to be musculoskeletal in nature. Patient was discharged with strict return precautions and encouraged to followup with his primary physician regarding chronic pain control. He was discharged and is agreeable with this plan.        Ethelda Chick, MD 11/11/11 1004

## 2011-11-10 NOTE — ED Notes (Signed)
Pt a/o x 3, NAD.  Denies pain at this time.

## 2011-11-10 NOTE — ED Notes (Signed)
Patient is resting comfortably. 

## 2011-11-10 NOTE — ED Notes (Signed)
Patient transported to X-ray 

## 2011-11-10 NOTE — ED Notes (Signed)
C/o left sided cp with radiation down left arm and assoc numbness and sob. Pt appears anxious. Pt also states out of pain medicine and has been under a lot of stress. Pt states hyperventilating earlier. today.

## 2011-11-16 ENCOUNTER — Emergency Department (HOSPITAL_COMMUNITY)
Admission: EM | Admit: 2011-11-16 | Discharge: 2011-11-16 | Disposition: A | Discharge status: Home / Self Care | Payer: Self-pay | Attending: Emergency Medicine | Admitting: Emergency Medicine

## 2011-11-16 ENCOUNTER — Other Ambulatory Visit: Payer: Self-pay

## 2011-11-16 ENCOUNTER — Emergency Department (HOSPITAL_COMMUNITY): Payer: Self-pay

## 2011-11-16 DIAGNOSIS — R0602 Shortness of breath: Secondary | ICD-10-CM | POA: Insufficient documentation

## 2011-11-16 DIAGNOSIS — J40 Bronchitis, not specified as acute or chronic: Secondary | ICD-10-CM | POA: Insufficient documentation

## 2011-11-16 DIAGNOSIS — I10 Essential (primary) hypertension: Secondary | ICD-10-CM | POA: Insufficient documentation

## 2011-11-16 LAB — DIFFERENTIAL
Eosinophils Absolute: 0.2 10*3/uL (ref 0.0–0.7)
Eosinophils Relative: 3 % (ref 0–5)
Lymphs Abs: 3.9 10*3/uL (ref 0.7–4.0)
Monocytes Absolute: 0.5 10*3/uL (ref 0.1–1.0)
Monocytes Relative: 6 % (ref 3–12)

## 2011-11-16 LAB — CBC
HCT: 46.9 % (ref 39.0–52.0)
Hemoglobin: 16 g/dL (ref 13.0–17.0)
MCH: 30.5 pg (ref 26.0–34.0)
MCV: 89.5 fL (ref 78.0–100.0)
Platelets: 211 10*3/uL (ref 150–400)
RBC: 5.24 MIL/uL (ref 4.22–5.81)

## 2011-11-16 LAB — BASIC METABOLIC PANEL
CO2: 28 mEq/L (ref 19–32)
Calcium: 10.1 mg/dL (ref 8.4–10.5)
Creatinine, Ser: 0.75 mg/dL (ref 0.50–1.35)
Glucose, Bld: 99 mg/dL (ref 70–99)

## 2011-11-16 MED ORDER — AZITHROMYCIN 250 MG PO TABS: 250.0000 mg | ORAL_TABLET | Freq: Every day | ORAL | Status: AC

## 2011-11-16 MED ORDER — IPRATROPIUM BROMIDE 0.02 % IN SOLN
0.5000 mg | RESPIRATORY_TRACT | Status: AC
  Administered 2011-11-16: 0.5 mg via RESPIRATORY_TRACT
  Filled 2011-11-16: qty 2.5

## 2011-11-16 MED ORDER — ALBUTEROL SULFATE HFA 108 (90 BASE) MCG/ACT IN AERS
2.0000 | INHALATION_SPRAY | RESPIRATORY_TRACT | Status: DC | PRN
  Administered 2011-11-16: 2 via RESPIRATORY_TRACT
  Filled 2011-11-16: qty 6.7

## 2011-11-16 MED ORDER — OXYCODONE-ACETAMINOPHEN 5-325 MG PO TABS
1.0000 | ORAL_TABLET | Freq: Once | ORAL | Status: AC
  Administered 2011-11-16: 1 via ORAL
  Filled 2011-11-16: qty 1

## 2011-11-16 MED ORDER — ALBUTEROL SULFATE (5 MG/ML) 0.5% IN NEBU
5.0000 mg | INHALATION_SOLUTION | Freq: Once | RESPIRATORY_TRACT | Status: AC
  Administered 2011-11-16: 5 mg via RESPIRATORY_TRACT
  Filled 2011-11-16: qty 1

## 2011-11-16 NOTE — ED Provider Notes (Signed)
History     CSN: [id number]  Arrival date & time 11/16/11  1333   First MD Initiated Contact with Patient 11/16/11 1419      Chief Complaint  Patient presents with  . Shortness of Breath    (Consider location/radiation/quality/duration/timing/severity/associated sxs/prior treatment) HPI  46 year old male with no prior history of cardiac disease, is presenting to the ED with chief complaints of shortness of breath. Patient states for the past week he has been having increased shortness of breath with exertion. He noticed that he gets out of breath even with taking short walk. He has been coughing up emesis with yellow sputum. He has subjective fever, and myalgias. He was seen in the ER a few days ago for these symptoms. At that time, a chest x-ray was performed, along with a d-dimer and 2 sets of troponin. All these finding were noted on initial examination. He has a negative d-dimer and normal troponins. He was diagnosed with bronchitis due to his smoking history.patient states the symptom has not improved, and continued to endorse dyspnea on exertion. Patient denies any recent surgery, prolonged bed rest, or leg swelling. He does complain of left knee pain but this is chronic. Patient also take chronic narcotic pain medication for his back pain.he has not been missing any dosage, and the last dose was this morning. Patient denies headache, chest pain, nausea, vomiting, diarrhea, abdominal pain. Patient states he has a cardiac stress test many years ago and was normal.  No past medical history on file.  No past surgical history on file.  No family history on file.  History  Substance Use Topics  . Smoking status: Not on file  . Smokeless tobacco: Not on file  . Alcohol Use: Not on file      Review of Systems  All other systems reviewed and are negative.    Allergies  Review of patient's allergies indicates no known allergies.  Home Medications   Current Outpatient Rx    Name Route Sig Dispense Refill  . IBUPROFEN 600 MG PO TABS Oral Take 600 mg by mouth every 6 (six) hours as needed. For pain    . OXYCODONE-ACETAMINOPHEN 5-325 MG PO TABS Oral Take 2 tablets by mouth 2 (two) times daily.    Marland Kitchen PRESCRIPTION MEDICATION Oral Take 1 tablet by mouth daily. Pt takes a blood pressure medication however pt does not know what pharmacy he feel this with and doesn't know the name of this medication.      BP 143/94  Pulse 85  Temp 99 F (37.2 C)  Resp 24  Wt 260 lb (117.935 kg)  SpO2 97%  Physical Exam  Nursing note and vitals reviewed. Constitutional: He appears well-developed and well-nourished. No distress.       Awake, alert, nontoxic appearance  HENT:  Head: Atraumatic.  Eyes: Conjunctivae are normal. Right eye exhibits no discharge. Left eye exhibits no discharge.  Neck: Normal range of motion. Neck supple.  Cardiovascular: Normal rate and regular rhythm.   Pulmonary/Chest: Effort normal. No respiratory distress. He exhibits no tenderness.  Abdominal: Soft. There is no tenderness. There is no rebound.  Musculoskeletal: He exhibits no tenderness.       ROM appears intact, no obvious focal weakness  Neurological: He is alert.  Skin: Skin is warm and dry. No rash noted.  Psychiatric: He has a normal mood and affect.    ED Course  Procedures (including critical care time)  Labs Reviewed  DIFFERENTIAL - Abnormal; Notable for  the following:    Neutrophils Relative 41 (*)    Lymphocytes Relative 51 (*)    All other components within normal limits  CBC  BASIC METABOLIC PANEL   Dg Chest 2 View  11/16/2011  *RADIOLOGY REPORT*  Clinical Data: Shortness of breath, asthma, hypertension.  CHEST - 2 VIEW  Comparison: 11/10/2011  Findings: Heart is upper limits normal in size.  No confluent opacities or effusions.  Probable minimal lingular scarring.  No acute bony abnormality.  IMPRESSION: No active disease or change.  Original Report Authenticated By: Cyndie Chime, M.D.     No diagnosis found.  Results for orders placed during the hospital encounter of 11/16/11  CBC      Component Value Range   WBC 7.7  4.0 - 10.5 (K/uL)   RBC 5.24  4.22 - 5.81 (MIL/uL)   Hemoglobin 16.0  13.0 - 17.0 (g/dL)   HCT 56.2  13.0 - 86.5 (%)   MCV 89.5  78.0 - 100.0 (fL)   MCH 30.5  26.0 - 34.0 (pg)   MCHC 34.1  30.0 - 36.0 (g/dL)   RDW 78.4  69.6 - 29.5 (%)   Platelets 211  150 - 400 (K/uL)  DIFFERENTIAL      Component Value Range   Neutrophils Relative 41 (*) 43 - 77 (%)   Neutro Abs 3.2  1.7 - 7.7 (K/uL)   Lymphocytes Relative 51 (*) 12 - 46 (%)   Lymphs Abs 3.9  0.7 - 4.0 (K/uL)   Monocytes Relative 6  3 - 12 (%)   Monocytes Absolute 0.5  0.1 - 1.0 (K/uL)   Eosinophils Relative 3  0 - 5 (%)   Eosinophils Absolute 0.2  0.0 - 0.7 (K/uL)   Basophils Relative 0  0 - 1 (%)   Basophils Absolute 0.0  0.0 - 0.1 (K/uL)  BASIC METABOLIC PANEL      Component Value Range   Sodium 138  135 - 145 (mEq/L)   Potassium 3.8  3.5 - 5.1 (mEq/L)   Chloride 100  96 - 112 (mEq/L)   CO2 28  19 - 32 (mEq/L)   Glucose, Bld 99  70 - 99 (mg/dL)   BUN 18  6 - 23 (mg/dL)   Creatinine, Ser 2.84  0.50 - 1.35 (mg/dL)   Calcium 13.2  8.4 - 10.5 (mg/dL)   GFR calc non Af Amer >90  >90 (mL/min)   GFR calc Af Amer >90  >90 (mL/min)  D-DIMER, QUANTITATIVE      Component Value Range   D-Dimer, Quant <0.22  0.00 - 0.48 (ug/mL-FEU)  POCT I-STAT TROPONIN I      Component Value Range   Troponin i, poc 0.00  0.00 - 0.08 (ng/mL)   Comment 3            Dg Chest 2 View  11/16/2011  *RADIOLOGY REPORT*  Clinical Data: Shortness of breath, asthma, hypertension.  CHEST - 2 VIEW  Comparison: 11/10/2011  Findings: Heart is upper limits normal in size.  No confluent opacities or effusions.  Probable minimal lingular scarring.  No acute bony abnormality.  IMPRESSION: No active disease or change.  Original Report Authenticated By: Cyndie Chime, M.D.   Dg Chest 2 View  11/10/2011   *RADIOLOGY REPORT*  Clinical Data: Chest pain, leg pain, anxiety  CHEST - 2 VIEW  Comparison: 08/14/2010  Findings: Cardiomediastinal silhouette is stable.  No acute infiltrate or pulmonary edema.  Stable atelectasis or scarring left base.  Minimal degenerative changes mid thoracic spine.  IMPRESSION: No active disease.  No significant change.  Original Report Authenticated By: Natasha Mead, M.D.     MDM   patient presents with dyspnea on exertion. He was recently diagnosed with bronchitis several days ago. He had a negative d-dimer and normal troponins during that visit. He has a chest x-ray that was unremarkable.  Today, his repeat chest x-ray is unremarkable. However, an EKG shows inverted T waves only in lead III.  patient denies chest pain but continued to endorse dyspnea on exertion. Symptoms improved with albuterol and Atrovent treatment.   6:00 PM I believe patient symptoms is likely bronchitis. Since he did not receive any medication last time, I will give him a prescription for azithromycin. I spend moderate amount of time discussing his condition. I also consult with my attending who has review the EKG and felt that it is not likely to be cardiac related. I did repeat a troponin and a d-dimer, both of which fall within normal limits. He has normal CBC and BMP and unremarkable chest x-ray. Patient will be given an albuterol inhaler at discharge. Patient voiced understanding and agreed with plan. Smoking cessation discussed.   Medical screening examination/treatment/procedure(s) were conducted as a shared visit with non-physician practitioner(s) and myself.  I personally evaluated the patient during the encounter Pt with cough that has been persistant.  Exam shows healthy-appearing man in no distress. Heart and lungs WNL.  Chest x-ray negative.  Lab workup negative.  Rx with azithromycin. Osvaldo Human, M.D.    Fayrene Helper, PA-C 11/16/11 1802  Carleene Cooper III, MD 11/17/11 [phone]

## 2011-11-16 NOTE — ED Notes (Signed)
Patient reports that he is continuing to have shortness of breath. Was seen at [facility] ED and diagnosed with bronchitis and now reports chills and aching. Reports not feeling better

## 2011-11-16 NOTE — ED Notes (Signed)
At 545 PM pt stated that pain was relieved

## 2011-11-16 NOTE — ED Notes (Signed)
Pt states he has also been emotionally stressed out

## 2011-11-16 NOTE — ED Notes (Signed)
Report given to receiving nurse

## 2011-11-16 NOTE — ED Notes (Signed)
Pt stated he is much better IV DC'd and he was discharged home with wife

## 2011-11-16 NOTE — ED Notes (Signed)
Patient transported to X-ray 

## 2011-11-16 NOTE — ED Notes (Signed)
ekg given to edms

## 2011-11-16 NOTE — Discharge Instructions (Signed)

## 2011-11-16 NOTE — ED Notes (Signed)
Pt is awaiting a room in the back. Pt is stable at this time. ekg show changes. Pt is not c/o sob at this time

## 2011-11-16 NOTE — ED Notes (Signed)
Respiratory contacted to administer breathing treatment.  

## 2011-11-16 NOTE — ED Provider Notes (Addendum)
6:00 PM  Date: 11/16/2011  Rate:79  Rhythm: normal sinus rhythm  QRS Axis: normal  Intervals: normal  ST/T Wave abnormalities: normal  Conduction Disutrbances:none  Narrative Interpretation: Normal EKG  Old EKG Reviewed: unchanged  Has pleuritic chest pain for several day.  Lab workup was negative for MI, PE.  Will Rx for acute bronchitis with azithromycin.   Carleene Cooper III, MD 11/16/11 1801  Carleene Cooper III, MD 11/16/11 [phone]

## 2012-03-08 ENCOUNTER — Encounter (HOSPITAL_COMMUNITY): Payer: Self-pay | Admitting: Family Medicine

## 2012-03-08 ENCOUNTER — Emergency Department (HOSPITAL_COMMUNITY): Payer: Self-pay

## 2012-03-08 ENCOUNTER — Emergency Department (HOSPITAL_COMMUNITY)
Admission: EM | Admit: 2012-03-08 | Discharge: 2012-03-08 | Disposition: A | Discharge status: Home / Self Care | Payer: Self-pay | Attending: Emergency Medicine | Admitting: Emergency Medicine

## 2012-03-08 DIAGNOSIS — J4489 Other specified chronic obstructive pulmonary disease: Secondary | ICD-10-CM | POA: Insufficient documentation

## 2012-03-08 DIAGNOSIS — I10 Essential (primary) hypertension: Secondary | ICD-10-CM | POA: Insufficient documentation

## 2012-03-08 DIAGNOSIS — R109 Unspecified abdominal pain: Secondary | ICD-10-CM | POA: Insufficient documentation

## 2012-03-08 DIAGNOSIS — R059 Cough, unspecified: Secondary | ICD-10-CM | POA: Insufficient documentation

## 2012-03-08 DIAGNOSIS — J449 Chronic obstructive pulmonary disease, unspecified: Secondary | ICD-10-CM | POA: Insufficient documentation

## 2012-03-08 DIAGNOSIS — K429 Umbilical hernia without obstruction or gangrene: Secondary | ICD-10-CM | POA: Insufficient documentation

## 2012-03-08 DIAGNOSIS — R05 Cough: Secondary | ICD-10-CM | POA: Insufficient documentation

## 2012-03-08 HISTORY — DX: Essential (primary) hypertension: I10

## 2012-03-08 HISTORY — DX: Chronic obstructive pulmonary disease, unspecified: J44.9

## 2012-03-08 LAB — COMPREHENSIVE METABOLIC PANEL
Alkaline Phosphatase: 91 U/L (ref 39–117)
BUN: 14 mg/dL (ref 6–23)
Calcium: 9.4 mg/dL (ref 8.4–10.5)
GFR calc Af Amer: >90 mL/min (ref >90)
Glucose, Bld: 104 mg/dL — ABNORMAL HIGH (ref 70–99)
Total Protein: 7.1 g/dL (ref 6.0–8.3)

## 2012-03-08 LAB — DIFFERENTIAL
Eosinophils Absolute: 0.2 10*3/uL (ref 0.0–0.7)
Eosinophils Relative: 2 % (ref 0–5)
Lymphs Abs: 3.4 10*3/uL (ref 0.7–4.0)
Monocytes Absolute: 1 10*3/uL (ref 0.1–1.0)
Monocytes Relative: 10 % (ref 3–12)

## 2012-03-08 LAB — URINALYSIS, ROUTINE W REFLEX MICROSCOPIC
Ketones, ur: NEGATIVE mg/dL
Leukocytes, UA: NEGATIVE
Nitrite: NEGATIVE
Specific Gravity, Urine: 1.021 (ref 1.005–1.030)
pH: 5.5 (ref 5.0–8.0)

## 2012-03-08 LAB — CBC
Hemoglobin: 16.8 g/dL (ref 13.0–17.0)
MCH: 31.1 pg (ref 26.0–34.0)
MCV: 90.4 fL (ref 78.0–100.0)
RBC: 5.41 MIL/uL (ref 4.22–5.81)

## 2012-03-08 MED ORDER — MORPHINE SULFATE 4 MG/ML IJ SOLN
4.0000 mg | Freq: Once | INTRAMUSCULAR | Status: AC
  Administered 2012-03-08: 4 mg via INTRAVENOUS
  Filled 2012-03-08: qty 1

## 2012-03-08 MED ORDER — ONDANSETRON HCL 4 MG/2ML IJ SOLN
4.0000 mg | Freq: Once | INTRAMUSCULAR | Status: AC
  Administered 2012-03-08: 4 mg via INTRAVENOUS
  Filled 2012-03-08: qty 2

## 2012-03-08 MED ORDER — OXYCODONE-ACETAMINOPHEN 5-325 MG PO TABS: 1.0000 | ORAL_TABLET | Freq: Four times a day (QID) | ORAL | Status: AC | PRN

## 2012-03-08 MED ORDER — HYDROCOD POLST-CHLORPHEN POLST 10-8 MG/5ML PO LQCR: 5.0000 mL | Freq: Two times a day (BID) | ORAL | Status: DC | PRN

## 2012-03-08 MED ORDER — SODIUM CHLORIDE 0.9 % IV BOLUS (SEPSIS)
1000.0000 mL | Freq: Once | INTRAVENOUS | Status: AC
  Administered 2012-03-08: 1000 mL via INTRAVENOUS

## 2012-03-08 MED ORDER — HYDROMORPHONE HCL PF 1 MG/ML IJ SOLN
1.0000 mg | Freq: Once | INTRAMUSCULAR | Status: AC
  Administered 2012-03-08: 1 mg via INTRAVENOUS
  Filled 2012-03-08: qty 1

## 2012-03-08 MED ORDER — IOHEXOL 300 MG/ML  SOLN
100.0000 mL | Freq: Once | INTRAMUSCULAR | Status: AC | PRN
  Administered 2012-03-08: 100 mL via INTRAVENOUS

## 2012-03-08 MED ORDER — SODIUM CHLORIDE 0.9 % IV SOLN
Freq: Once | INTRAVENOUS | Status: AC
  Administered 2012-03-08: 21:00:00 via INTRAVENOUS

## 2012-03-08 NOTE — ED Notes (Signed)
Pt verbalizes understanding.  Nadn.  Pt family at bedside

## 2012-03-08 NOTE — ED Notes (Signed)
Pt reports found out he had a hernia x1 week ago, reports pain is worse today, states only able to eat a small amount of food, and then feels sob.  Reports coughing x1 week.  Reports right leg pain and groin pain.

## 2012-03-08 NOTE — Discharge Instructions (Signed)
Follow up with the Gastroenterologist or general surgeon listed above for further evaluation of your abdominal pain. Only use your pain medication for severe pain. Do not operate heavy machinery while on pain medication or muscle relaxer. Note that your pain medication contains acetaminophen (Tylenol) & its is not recommended that you use additional acetaminophen (Tylenol) while taking this medication.  ° °Abdominal Pain  °Your exam might not show the exact reason you have abdominal pain. Since there are many different causes of abdominal pain, another checkup and more tests may be needed. It is very important to follow up for lasting (persistent) or worsening symptoms. A possible cause of abdominal pain in any person who still has his or her appendix is acute appendicitis. Appendicitis is often hard to diagnose. Normal blood tests, urine tests, ultrasound, and CT scans do not completely rule out early appendicitis or other causes of abdominal pain. Sometimes, only the changes that happen over time will allow appendicitis and other causes of abdominal pain to be determined. Other potential problems that may require surgery may also take time to become more apparent. Because of this, it is important that you follow all of the instructions below.  ° °HOME CARE INSTRUCTIONS  °Do not take laxatives unless directed by your caregiver. °Rest as much as possible.  °Do not eat solid food until your pain is gone: A diet of water, weak decaffeinated tea, broth or bouillon, gelatin, oral rehydration solutions (ORS), frozen ice pops, or ice chips may be helpful.  °When pain is gone: Start a light diet (dry toast, crackers, applesauce, or white rice). Increase the diet slowly as long as it does not bother you. Eat no dairy products (including cheese and eggs) and no spicy, fatty, fried, or high-fiber foods.  °Use no alcohol, caffeine, or cigarettes.  °Take your regular medicines unless your caregiver told you not to.  °Take any  prescribed medicine as directed.  ° °SEEK IMMEDIATE MEDICAL CARE IF:  °The pain does not go away.  °You have a fever >101 that persists °You keep throwing up (vomiting) or cannot drink liquids.  °The pain becomes localized (Pain in the right side could possibly be appendicitis. In an adult, pain in the left lower portion of the abdomen could be colitis or diverticulitis). °You pass bloody or black tarry stools.  °You have shaking chills.  °There is blood in your vomit or you see blood in your bowel movements.  °Your bowel movements stop (become blocked) or you cannot pass gas.  °You have bloody, frequent, or painful urination.  °You have yellow discoloration in the skin or whites of the eyes.  °Your stomach becomes bloated or bigger.  °You have dizziness or fainting.  °You have chest or back pain.  ° ° ° °

## 2012-03-08 NOTE — ED Provider Notes (Signed)
Medical screening examination/treatment/procedure(s) were performed by non-physician practitioner and as supervising physician I was immediately available for consultation/collaboration.   Gwyneth Sprout, MD 03/08/12 2324

## 2012-03-08 NOTE — ED Provider Notes (Signed)
History     CSN: [id number]  Arrival date & time 03/08/12  1730   First MD Initiated Contact with Patient 03/08/12 1752      Chief Complaint  Patient presents with  . Umbilical Hernia  . Cough    (Consider location/radiation/quality/duration/timing/severity/associated sxs/prior treatment) HPI History from patient. 46 year old male who presents with abdominal pain. This began today. Pain is located primarily to the umbilicus, but he additionally has pain to the right lower quadrant which seems to radiate into his groin and leg. Pain is sharp in nature. No known alleviating factors. No treatment at home prior to coming here. He has had nausea without emesis with this. He has not had a bowel movement today and has had a reduced appetite. He denies fever or chills. Has never had anything like this before.  He states he was seen at an out of town hospital about a week ago for an allergic reaction. During that exam, his ED provider noted that he had an umbilical hernia and he was told that he needed to followup with his PCP for this. No history of abdominal surgeries. States he does have intermittent pain around this area, but the pain today is different.  Patient also mentions that he has had a bad cold and has been frequently coughing. This occasionally makes him feel short of breath.  Past Medical History  Diagnosis Date  . Hypertension   . COPD (chronic obstructive pulmonary disease)     Past Surgical History  Procedure Date  . Knee surgery     History reviewed. No pertinent family history.  History  Substance Use Topics  . Smoking status: Current Everyday Smoker -- 1.0 packs/day  . Smokeless tobacco: Not on file  . Alcohol Use: No      Review of Systems  Constitutional: Negative for fever, chills, activity change and appetite change.  HENT: Positive for congestion. Negative for sore throat.   Respiratory: Positive for cough and shortness of breath.   Cardiovascular:  Negative for chest pain and palpitations.  Gastrointestinal: Positive for nausea and abdominal pain. Negative for vomiting.  Genitourinary: Negative for dysuria, penile swelling, scrotal swelling and testicular pain.  Musculoskeletal: Negative for myalgias.  Skin: Negative for color change and rash.  All other systems reviewed and are negative.    Allergies  Review of patient's allergies indicates no known allergies.  Home Medications   Current Outpatient Rx  Name Route Sig Dispense Refill  . IBUPROFEN 600 MG PO TABS Oral Take 600 mg by mouth every 6 (six) hours as needed. For pain    . LISINOPRIL 10 MG PO TABS Oral Take 10 mg by mouth daily.    Marland Kitchen LORATADINE 10 MG PO TABS Oral Take 10 mg by mouth daily.    . OXYCODONE-ACETAMINOPHEN 5-325 MG PO TABS Oral Take 2 tablets by mouth 2 (two) times daily.      BP 162/106  Pulse 110  Temp(Src) 98.5 F (36.9 C) (Oral)  Resp 24  SpO2 99%  Physical Exam  Nursing note and vitals reviewed. Constitutional: He appears well-developed and well-nourished. No distress.       Pt is uncomfortable appearing Mild tachypnea/tachycardia/hypertensive - likely 2/2 pain  HENT:  Head: Normocephalic and atraumatic.  Neck: Normal range of motion.  Cardiovascular: Normal rate, regular rhythm and normal heart sounds.   Pulmonary/Chest: Effort normal and breath sounds normal.  Abdominal: Soft. Bowel sounds are normal.       Umbilical hernia with palpable bowel. This  is not entirely reducible. The patient is tender to palpation of this area. Patient also has considerable pain to the right lower quadrant. No rebound tenderness. Negative Rovsing's, obturator, and jar testing.  Genitourinary: Testes normal and penis normal. Circumcised.       No palpable mass, swelling, or hernia on exam. Pt does have pain into the RLQ on palpation of R inguinal area. Chrissie Noa, NT chaperone, present during exam)  Musculoskeletal: Normal range of motion.  Neurological: He is  alert.  Skin: Skin is warm and dry. He is not diaphoretic.  Psychiatric: He has a normal mood and affect.    ED Course  Procedures (including critical care time)  Labs Reviewed  COMPREHENSIVE METABOLIC PANEL - Abnormal; Notable for the following:    Glucose, Bld 104 (*)    Total Bilirubin 0.2 (*)    All other components within normal limits  CBC  DIFFERENTIAL  URINALYSIS, ROUTINE W REFLEX MICROSCOPIC  LIPASE, BLOOD   Dg Abd Acute W/chest  03/08/2012  *RADIOLOGY REPORT*  Clinical Data: 46 year old male with abdominal pain, bloating, shortness of breath, history of umbilical hernia, right side groin pain radiating down the lower extremity.  ACUTE ABDOMEN SERIES (ABDOMEN 2 VIEW & CHEST 1 VIEW)  Comparison: 04/17/2011.  Findings: Stable low lung volumes.  Cardiac size and mediastinal contours are within normal limits.  Visualized tracheal air column is within normal limits.  No pneumothorax or pneumoperitoneum. Stable increased pulmonary interstitial markings.  Nonobstructed bowel gas pattern. No acute osseous abnormality identified.  Abdominal pelvic visceral contours are within normal limits.  IMPRESSION:  1. Nonobstructed bowel gas pattern, no free air. 2. No acute cardiopulmonary abnormality.  Original Report Authenticated By: Harley Hallmark, M.D.     No diagnosis found.    MDM  Pt with abd pain to umbilicus, RLQ, into groin. Concern for acute abd. Acute abd series with no acute findings. Labs generally unremarkable - no white count. Pt awaiting CT abd pel. Pt care signed out to VanWingen, PA-C at shift change.        Grant Fontana, [place] 03/08/12 2043

## 2012-04-24 ENCOUNTER — Emergency Department (HOSPITAL_COMMUNITY): Payer: Self-pay

## 2012-04-24 ENCOUNTER — Encounter (HOSPITAL_COMMUNITY): Payer: Self-pay | Admitting: Emergency Medicine

## 2012-04-24 ENCOUNTER — Emergency Department (HOSPITAL_COMMUNITY)
Admission: EM | Admit: 2012-04-24 | Discharge: 2012-04-24 | Disposition: A | Discharge status: Home / Self Care | Payer: Self-pay | Attending: Emergency Medicine | Admitting: Emergency Medicine

## 2012-04-24 DIAGNOSIS — K429 Umbilical hernia without obstruction or gangrene: Secondary | ICD-10-CM | POA: Insufficient documentation

## 2012-04-24 DIAGNOSIS — R141 Gas pain: Secondary | ICD-10-CM | POA: Insufficient documentation

## 2012-04-24 DIAGNOSIS — K5900 Constipation, unspecified: Secondary | ICD-10-CM | POA: Insufficient documentation

## 2012-04-24 DIAGNOSIS — R5381 Other malaise: Secondary | ICD-10-CM | POA: Insufficient documentation

## 2012-04-24 DIAGNOSIS — R109 Unspecified abdominal pain: Secondary | ICD-10-CM | POA: Insufficient documentation

## 2012-04-24 DIAGNOSIS — R112 Nausea with vomiting, unspecified: Secondary | ICD-10-CM | POA: Insufficient documentation

## 2012-04-24 DIAGNOSIS — R5383 Other fatigue: Secondary | ICD-10-CM | POA: Insufficient documentation

## 2012-04-24 DIAGNOSIS — R142 Eructation: Secondary | ICD-10-CM | POA: Insufficient documentation

## 2012-04-24 LAB — CBC WITH DIFFERENTIAL/PLATELET
Basophils Absolute: 0 10*3/uL (ref 0.0–0.1)
Basophils Relative: 0 % (ref 0–1)
Eosinophils Absolute: 0.2 10*3/uL (ref 0.0–0.7)
Lymphs Abs: 3.8 10*3/uL (ref 0.7–4.0)
MCH: 31.5 pg (ref 26.0–34.0)
MCHC: 35.6 g/dL (ref 30.0–36.0)
Neutrophils Relative %: 63 % (ref 43–77)
Platelets: 225 10*3/uL (ref 150–400)
RBC: 5.17 MIL/uL (ref 4.22–5.81)
RDW: 12.5 % (ref 11.5–15.5)

## 2012-04-24 LAB — URINALYSIS, ROUTINE W REFLEX MICROSCOPIC
Glucose, UA: NEGATIVE mg/dL
Ketones, ur: NEGATIVE mg/dL
Leukocytes, UA: NEGATIVE
Nitrite: NEGATIVE
Specific Gravity, Urine: 1.013 (ref 1.005–1.030)
pH: 6 (ref 5.0–8.0)

## 2012-04-24 LAB — COMPREHENSIVE METABOLIC PANEL
ALT: 27 U/L (ref 0–53)
AST: 19 U/L (ref 0–37)
Albumin: 4 g/dL (ref 3.5–5.2)
Alkaline Phosphatase: 88 U/L (ref 39–117)
Potassium: 3.6 mEq/L (ref 3.5–5.1)
Sodium: 136 mEq/L (ref 135–145)
Total Protein: 7.3 g/dL (ref 6.0–8.3)

## 2012-04-24 MED ORDER — HYDROMORPHONE HCL PF 1 MG/ML IJ SOLN
1.0000 mg | Freq: Once | INTRAMUSCULAR | Status: AC
  Administered 2012-04-24: 1 mg via INTRAVENOUS
  Filled 2012-04-24: qty 1

## 2012-04-24 MED ORDER — HYDROCODONE-ACETAMINOPHEN 5-325 MG PO TABS: 1.0000 | ORAL_TABLET | ORAL | Status: DC | PRN

## 2012-04-24 MED ORDER — IOHEXOL 300 MG/ML  SOLN
100.0000 mL | Freq: Once | INTRAMUSCULAR | Status: AC | PRN
  Administered 2012-04-24: 100 mL via INTRAVENOUS

## 2012-04-24 MED ORDER — ONDANSETRON HCL 4 MG/2ML IJ SOLN
4.0000 mg | Freq: Once | INTRAMUSCULAR | Status: AC
  Administered 2012-04-24: 4 mg via INTRAVENOUS
  Filled 2012-04-24: qty 2

## 2012-04-24 MED ORDER — PROMETHAZINE HCL 25 MG PO TABS: 25.0000 mg | ORAL_TABLET | Freq: Four times a day (QID) | ORAL | Status: DC | PRN

## 2012-04-24 NOTE — ED Notes (Signed)
Pt states yesterday evening, L shoulder started hurting and having L arm weakness, slight L leg weakness. Denies any known injury. Pt states has some tingling in L arm when moving shoulder the wrong way. Able to lift L arm half way up. Smile symmetrical and tongue midline. Pt also states he is constipated, last BM x 4 days ago. Pt also has an umbilical hernia which he states he needs surgery on, hernia tender to palpate. Pt a/o x 4.

## 2012-04-24 NOTE — ED Provider Notes (Signed)
History     CSN: [id number]  Arrival date & time 04/24/12  1613   First MD Initiated Contact with Patient 04/24/12 1631      Chief Complaint  Patient presents with  . Shortness of Breath  . Hernia  . Arm Pain    left     (Consider location/radiation/quality/duration/timing/severity/associated sxs/prior treatment) HPI Comments: Patient with hx umbilical hernia reports several days of worsening abdominal pain, bloating, nausea/vomiting, constipation.  Pt is passing flatus, states it helps his pain when he does.  States his hernia is usually deep and easy to push back in, recently has been more protruding and extremely tender to palpation.  Emesis is contents of his stomach.  Has taken laxatives without relief.  Pain is exacerbated by eating, palpation. Radiates to testicles. Denies fevers, urinary symptoms, testicular pain or swelling.    Pt also notes chronic left shoulder pain that is now worsening, states it feels like a "muscle spasm" when he tries to abduct his arm and he is unable to do it.    Patient is a 46 y.o. male presenting with shortness of breath and arm pain. The history is provided by the patient.  Shortness of Breath  Pertinent negatives include no chest pain, no fever, no cough and no shortness of breath.  Arm Pain Associated symptoms include abdominal pain, nausea and vomiting. Pertinent negatives include no chest pain, chills, coughing or fever.    Past Medical History  Diagnosis Date  . Hypertension   . COPD (chronic obstructive pulmonary disease)     Past Surgical History  Procedure Date  . Knee surgery   . Thumb surgery (left)   . Eye surgery     History reviewed. No pertinent family history.  History  Substance Use Topics  . Smoking status: Current Everyday Smoker -- 1.0 packs/day  . Smokeless tobacco: Not on file  . Alcohol Use: Yes     rarely       Review of Systems  Constitutional: Negative for fever and chills.  Respiratory: Negative  for cough and shortness of breath.   Cardiovascular: Negative for chest pain.  Gastrointestinal: Positive for nausea, vomiting, abdominal pain and constipation. Negative for blood in stool.  Genitourinary: Negative for dysuria, urgency and frequency.    Allergies  Review of patient's allergies indicates no known allergies.  Home Medications   Current Outpatient Rx  Name Route Sig Dispense Refill  . CYCLOBENZAPRINE HCL 10 MG PO TABS Oral Take 10 mg by mouth 3 (three) times daily as needed. For muscle spasm.    . IBUPROFEN 200 MG PO TABS Oral Take 600 mg by mouth every 8 (eight) hours as needed. For pain.    Marland Kitchen LISINOPRIL 10 MG PO TABS Oral Take 10 mg by mouth daily.    Marland Kitchen LORATADINE 10 MG PO TABS Oral Take 10 mg by mouth daily.    . OXYCODONE-ACETAMINOPHEN 5-325 MG PO TABS Oral Take 2 tablets by mouth 2 (two) times daily.      BP 150/106  Pulse 101  Temp 98.9 F (37.2 C) (Oral)  Resp 22  SpO2 99%  Physical Exam  Nursing note and vitals reviewed. Constitutional: He appears well-developed and well-nourished. No distress.  Cardiovascular: Normal rate and regular rhythm.   Pulmonary/Chest: Effort normal and breath sounds normal. No respiratory distress. He has no wheezes. He has no rales.  Abdominal: Soft. He exhibits distension. He exhibits no mass. There is tenderness. There is no rebound and no guarding. A  hernia is present.         Abdomen distended, diffusely tender to palpation, no guarding, no rebound.    Musculoskeletal:       Left shoulder: He exhibits no tenderness, no bony tenderness, no swelling and no deformity.  Skin: He is not diaphoretic.    ED Course  Procedures (including critical care time)  Labs Reviewed  CBC WITH DIFFERENTIAL - Abnormal; Notable for the following:    WBC 12.3 (*)     All other components within normal limits  COMPREHENSIVE METABOLIC PANEL  URINALYSIS, ROUTINE W REFLEX MICROSCOPIC   Dg Abd Acute W/chest  04/24/2012  *RADIOLOGY REPORT*   Clinical Data: Abdominal pain and constipation.  Rule out small bowel obstruction  ACUTE ABDOMEN SERIES (ABDOMEN 2 VIEW & CHEST 1 VIEW)  Comparison: CT 03/08/2012  Findings: Mild cardiac enlargement without heart failure. Increased lung markings in the bases are unchanged  and may be atelectasis or scarring.  No definite heart failure or pneumonia.  No dilated bowel loops are present.  Air-fluid levels are present in the right colon.  No significant small bowel air-fluid level. Negative for pneumoperitoneum.  No renal calculi.  Osteophyte of the left SI joint.  IMPRESSION: Mild bibasilar atelectasis or scarring.  Nonobstructive bowel gas pattern.  Air-fluid levels in nondilated right colon.  Original Report Authenticated By: Camelia Phenes, M.D.     No diagnosis found.    MDM  Patient with umbilical hernia with several days of increased abdominal pain, bloating, vomiting, constipation.  Pt is able to pass flatus.  Concern for incarcerated hernia.   Discussed patient with Ivonne Andrew, PA-C, who assumes care of patient at change of shift; pending CT abd/pelvis.          [place], [place] 04/24/12 2024

## 2012-04-24 NOTE — ED Notes (Signed)
Pt states SOB, hernia "has popped out," left arm is in pain and limp "can't hardly move it." Pt c/o nausea, vomiting, dizziness, and weakness. Pt states not normal bowel movements, hurts to strain. Pt states hurts to eat.

## 2012-04-24 NOTE — ED Provider Notes (Signed)
Trevor Thomas S 8:00 PM patient discussed in sign out with Trixie Dredge PAC. Patient with known umbilical hernia. Patient having increased pain and tenderness around the umbilical hernia site. Symptoms were associated with nausea vomiting. CT scan pending to evaluate for possible obstruction.    8:45 PM CT scan results show small fat containing umbilical hernia. No signs for SBO. Findings were discussed with attending physician. At this time we'll give outpatient Gen. surgery referral.    Trevor Seller, PA 04/24/12 2111

## 2012-04-24 NOTE — ED Notes (Signed)
Left hand weakness and movement. Pt cannot raise left arm. Smile symmetrical, tongue midline. Pt states whole left side is weak. Pain and weakness started yesterday. Pt states not having BM in 3-4 days.

## 2012-04-25 NOTE — ED Provider Notes (Signed)
Medical screening examination/treatment/procedure(s) were performed by non-physician practitioner and as supervising physician I was immediately available for consultation/collaboration.  Toy Baker, MD 04/25/12 [phone]

## 2012-04-25 NOTE — ED Provider Notes (Signed)
Medical screening examination/treatment/procedure(s) were performed by non-physician practitioner and as supervising physician I was immediately available for consultation/collaboration.  Azzam Mehra T Chalee Hirota, MD 04/25/12 0912 

## 2012-05-01 ENCOUNTER — Ambulatory Visit (INDEPENDENT_AMBULATORY_CARE_PROVIDER_SITE_OTHER): Payer: PRIVATE HEALTH INSURANCE | Admitting: Surgery

## 2012-05-01 ENCOUNTER — Encounter (INDEPENDENT_AMBULATORY_CARE_PROVIDER_SITE_OTHER): Payer: Self-pay | Admitting: Surgery

## 2012-05-01 VITALS — BP 140/86 | HR 88 | Temp 98.2°F | Resp 16 | Ht 71.0 in | Wt 262.0 lb

## 2012-05-01 DIAGNOSIS — K429 Umbilical hernia without obstruction or gangrene: Secondary | ICD-10-CM

## 2012-05-01 NOTE — Progress Notes (Signed)
Patient ID: Trevor Thomas, male   DOB: 1966/02/08, 46 y.o.   MRN: [id number]  No chief complaint on file.   HPI Trevor Thomas is a 46 y.o. male.   HPIPatient sent at the request of Dr. Philipp Deputy due to pain at umbilicus. He has been evaluated in the emergency room and by exam and CT scan was found to have a painful fat-containing umbilical hernia. He has had problems for 2-3 months with periumbilical pain. It is made worse with straining with bowel movements and coughing. He is a heavy smoker. He does at some mild to moderate constipation. The area is tender to push on. Nausea and vomiting.  Past Medical History  Diagnosis Date  . Hypertension   . COPD (chronic obstructive pulmonary disease)     Past Surgical History  Procedure Date  . Knee surgery   . Thumb surgery (left)   . Eye surgery     No family history on file.  Social History History  Substance Use Topics  . Smoking status: Current Everyday Smoker -- 1.0 packs/day  . Smokeless tobacco: Not on file  . Alcohol Use: Yes     rarely     No Known Allergies  Current Outpatient Prescriptions  Medication Sig Dispense Refill  . cyclobenzaprine (FLEXERIL) 10 MG tablet Take 10 mg by mouth 3 (three) times daily as needed. For muscle spasm.      Marland Kitchen HYDROcodone-acetaminophen (NORCO/VICODIN) 5-325 MG per tablet Take 1-2 tablets by mouth every 4 (four) hours as needed for pain.  20 tablet  0  . ibuprofen (ADVIL,MOTRIN) 200 MG tablet Take 600 mg by mouth every 8 (eight) hours as needed. For pain.      Marland Kitchen lisinopril (PRINIVIL,ZESTRIL) 10 MG tablet Take 10 mg by mouth daily.      Marland Kitchen loratadine (CLARITIN) 10 MG tablet Take 10 mg by mouth daily.      Marland Kitchen oxyCODONE-acetaminophen (PERCOCET) 5-325 MG per tablet Take 2 tablets by mouth 2 (two) times daily.      . promethazine (PHENERGAN) 25 MG tablet Take 1 tablet (25 mg total) by mouth every 6 (six) hours as needed for nausea.  20 tablet  0    Review of Systems Review of Systems    Constitutional: Positive for fatigue.  HENT: Negative.   Eyes: Negative.   Respiratory: Positive for cough.   Cardiovascular: Negative.   Gastrointestinal: Positive for abdominal pain.  Genitourinary: Negative.   Musculoskeletal: Negative.   Skin: Negative.   Neurological: Negative.   Hematological: Negative.   Psychiatric/Behavioral: Negative.     Blood pressure 140/86, pulse 88, temperature 98.2 F (36.8 C), resp. rate 16, height 5\' 11"  (1.803 m), weight 262 lb (118.842 kg).  Physical Exam Physical Exam  Constitutional: He is oriented to person, place, and time. He appears well-developed and well-nourished.  HENT:  Head: Normocephalic and atraumatic.  Eyes: EOM are normal. Pupils are equal, round, and reactive to light.  Neck: Normal range of motion. Neck supple.  Cardiovascular: Normal rate and regular rhythm.   Pulmonary/Chest: He has wheezes.  Abdominal:    Musculoskeletal: Normal range of motion.  Neurological: He is alert and oriented to person, place, and time.  Skin: Skin is warm and dry.  Psychiatric: He has a normal mood and affect. His behavior is normal. Judgment and thought content normal.    Data Reviewed    Clinical Data: Abdominal pain umbilical hernia, nausea, vomiting,  weakness  CT ABDOMEN AND PELVIS WITH CONTRAST  Technique: Multidetector  CT imaging of the abdomen and pelvis was  performed following the standard protocol during bolus  administration of intravenous contrast.  Contrast: OMNIPAQUE IOHEXOL 300 MG/ML SOLN  Comparison: 03/08/2012  Findings: Minimal inferior lingula and right middle lobe  atelectasis / scarring anteriorly. Dependent basilar atelectasis  posteriorly. No lower lobe pneumonia. Normal heart size. No  pericardial or pleural effusion. Negative for hiatal hernia.  Abdomen: Mild hypoattenuation of the liver parenchyma suspect  fatty infiltration or hepatic steatosis with some fatty sparing  along the gallbladder. No  focal hepatic abnormality or biliary  dilatation. Gallbladder, biliary system, pancreas, spleen, adrenal  glands, and kidneys are within normal limits for age and  demonstrate no acute process.  Negative for bowel obstruction, dilatation, ileus, or free air.  No abdominal free fluid, fluid collection, hemorrhage, adenopathy,  or abscess.  Normal appendix in the right lower quadrant.  Pelvis: No pelvic free fluid, fluid collection, hemorrhage,  adenopathy, abscess, or inguinal abnormality. Urinary bladder and  distal ureters unremarkable.  Small fat containing umbilical hernia only measuring 16 mm in  diameter, image 55.  Mild diffuse degenerative changes of the spine and SI joints.  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  IMPRESSION:  Small fat containing umbilical hernia. No associated bowel  herniation or obstruction.  Hepatic steatosis.  Normal appendix.  No acute intra-abdominal or pelvic process  Original Report Authenticated By: Judie Petit. Ruel Favors, M.D.                Assessment    Umbilical hernia reducible symptomatic  COPD  Tobacco abuse     Plan    Recommend surgical repair. Risks, benefits and alternative therapies discussed. High risk for wound infection, recurrence, and need for further procedures due to tobacco abuse. Counseled him about quitting smoking and the consequences of not doing so with surgery. Risks of bleeding, infection, bowel injury, scar tissue formation, hernia recurrence, loss of skin of her umbilicus, pulmonary complications, cardiovascular patient, DVT, myocardial portion, death, and further surgery discussed. The risk of hernia repair include bleeding,  Infection,   Recurrence of the hernia,  Mesh use, chronic pain,  Organ injury,  Bowel injury,  Bladder injury,   nerve injury with numbness around the incision,  Death,  and worsening of preexisting  medical problems.  The alternatives to surgery have been  discussed as well..  Long term expectations of both operative and non operative treatments have been discussed.   The patient agrees to proceed.       Taylan Marez A. 05/01/2012, 10:47 AM

## 2012-05-01 NOTE — Patient Instructions (Signed)
Constipation in Adults Constipation is having fewer than 2 bowel movements per week. Usually, the stools are hard. As we grow older, constipation is more common. If you try to fix constipation with laxatives, the problem may get worse. This is because laxatives taken over a long period of time make the colon muscles weaker. A low-fiber diet, not taking in enough fluids, and taking some medicines may make these problems worse. MEDICATIONS THAT MAY CAUSE CONSTIPATION  Water pills (diuretics).   Calcium channel blockers (used to control blood pressure and for the heart).   Certain pain medicines (narcotics).   Anticholinergics.   Anti-inflammatory agents.   Antacids that contain aluminum.  DISEASES THAT CONTRIBUTE TO CONSTIPATION  Diabetes.   Parkinson's disease.   Dementia.   Stroke.   Depression.   Illnesses that cause problems with salt and water metabolism.  HOME CARE INSTRUCTIONS   Constipation is usually best cared for without medicines. Increasing dietary fiber and eating more fruits and vegetables is the best way to manage constipation.   Slowly increase fiber intake to 25 to 38 grams per day. Whole grains, fruits, vegetables, and legumes are good sources of fiber. A dietitian can further help you incorporate high-fiber foods into your diet.   Drink enough water and fluids to keep your urine clear or pale yellow.   A fiber supplement may be added to your diet if you cannot get enough fiber from foods.   Increasing your activities also helps improve regularity.   Suppositories, as suggested by your caregiver, will also help. If you are using antacids, such as aluminum or calcium containing products, it will be helpful to switch to products containing magnesium if your caregiver says it is okay.   If you have been given a liquid injection (enema) today, this is only a temporary measure. It should not be relied on for treatment of longstanding (chronic) constipation.    Stronger measures, such as magnesium sulfate, should be avoided if possible. This may cause uncontrollable diarrhea. Using magnesium sulfate may not allow you time to make it to the bathroom.  SEEK IMMEDIATE MEDICAL CARE IF:   There is bright red blood in the stool.   The constipation stays for more than 4 days.   There is belly (abdominal) or rectal pain.   You do not seem to be getting better.   You have any questions or concerns.  MAKE SURE YOU:   Understand these instructions.   Will watch your condition. MIRALAX DAILY.  THIS IS OVER THE COUNTER TAKE FOR BOWELS AS DIRECTED.  Will get help right away if you are not doing well or get worse.  Document Released: 06/17/2004 Document Revised: 09/08/2011 Document Reviewed: 08/23/2011 [facility] Patient Information 2012 [place], [place].  Hernia A hernia occurs when an internal organ pushes out through a weak spot in the abdominal wall. Hernias most commonly occur in the groin and around the navel. Hernias often can be pushed back into place (reduced). Most hernias tend to get worse over time. Some abdominal hernias can get stuck in the opening (irreducible or incarcerated hernia) and cannot be reduced. An irreducible abdominal hernia which is tightly squeezed into the opening is at risk for impaired blood supply (strangulated hernia). A strangulated hernia is a medical emergency. Because of the risk for an irreducible or strangulated hernia, surgery may be recommended to repair a hernia. CAUSES   Heavy lifting.   Prolonged coughing.   Straining to have a bowel movement.   A cut (  incision) made during an abdominal surgery.  HOME CARE INSTRUCTIONS   Bed rest is not required. You may continue your normal activities.   Avoid lifting more than 10 pounds (4.5 kg) or straining.   Cough gently. If you are a smoker it is best to stop. Even the best hernia repair can break down with the continual strain of coughing. Even if you do not have  your hernia repaired, a cough will continue to aggravate the problem.   Do not wear anything tight over your hernia. Do not try to keep it in with an outside bandage or truss. These can damage abdominal contents if they are trapped within the hernia sac.   Eat a normal diet.   Avoid constipation. Straining over long periods of time will increase hernia size and encourage breakdown of repairs. If you cannot do this with diet alone, stool softeners may be used.  SEEK IMMEDIATE MEDICAL CARE IF:   You have a fever.   You develop increasing abdominal pain.   You feel nauseous or vomit.   Your hernia is stuck outside the abdomen, looks discolored, feels hard, or is tender.   You have any changes in your bowel habits or in the hernia that are unusual for you.   You have increased pain or swelling around the hernia.   You cannot push the hernia back in place by applying gentle pressure while lying down.  MAKE SURE YOU:   Understand these instructions.   Will watch your condition.   Will get help right away if you are not doing well or get worse.  Document Released: 09/19/2005 Document Revised: 09/08/2011 Document Reviewed: 05/08/2008 [facility] Patient Information 2012 [place], [place].

## 2012-05-02 ENCOUNTER — Encounter (HOSPITAL_COMMUNITY): Payer: Self-pay | Admitting: Pharmacy Technician

## 2012-05-09 NOTE — Pre-Procedure Instructions (Signed)
20 Trevor Thomas  05/09/2012   Your procedure is scheduled on: Tuesday May 15, 2012  Report to [facility] at 5:30 AM.  Call this number if you have problems the morning of surgery: [phone]   Remember:   Do not eat food or drink:After Midnight.      Take these medicines the morning of surgery with A SIP OF WATER: claritin, percocet,   DISCONTINUE ASPIRIN, COUMADIN, PLAVIX, EFFIENT, AND HERBAL MEDICATIONS.   Do not wear jewelry, make-up or nail polish.  Do not wear lotions, powders, or perfumes. You may wear deodorant.  Do not shave 48 hours prior to surgery. Men may shave face and neck.  Do not bring valuables to the hospital.  Contacts, dentures or bridgework may not be worn into surgery.  Leave suitcase in the car. After surgery it may be brought to your room.  For patients admitted to the hospital, checkout time is 11:00 AM the day of discharge.   Patients discharged the day of surgery will not be allowed to drive home.  Name and phone number of your driver: FAMILY / FRIEND   Special Instructions: CHG Shower Use Special Wash: 1/2 bottle night before surgery and 1/2 bottle morning of surgery.   Please read over the following fact sheets that you were given: Pain Booklet, Coughing and Deep Breathing, MRSA Information and Surgical Site Infection Prevention

## 2012-05-10 ENCOUNTER — Encounter (HOSPITAL_COMMUNITY): Payer: Self-pay

## 2012-05-10 ENCOUNTER — Encounter (HOSPITAL_COMMUNITY)
Admission: RE | Admit: 2012-05-10 | Discharge: 2012-05-10 | Disposition: A | Discharge status: Home / Self Care | Payer: Self-pay | Source: Ambulatory Visit | Attending: Surgery | Admitting: Surgery

## 2012-05-10 HISTORY — DX: Major depressive disorder, single episode, unspecified: F32.9

## 2012-05-10 HISTORY — DX: Anxiety disorder, unspecified: F41.9

## 2012-05-10 HISTORY — DX: Headache: R51

## 2012-05-10 HISTORY — DX: Depression, unspecified: F32.A

## 2012-05-10 LAB — COMPREHENSIVE METABOLIC PANEL
AST: 25 U/L (ref 0–37)
Alkaline Phosphatase: 82 U/L (ref 39–117)
CO2: 29 mEq/L (ref 19–32)
Chloride: 103 mEq/L (ref 96–112)
Creatinine, Ser: 0.79 mg/dL (ref 0.50–1.35)
GFR calc non Af Amer: >90 mL/min (ref >90)
Potassium: 4.2 mEq/L (ref 3.5–5.1)
Total Bilirubin: 0.2 mg/dL — ABNORMAL LOW (ref 0.3–1.2)

## 2012-05-10 LAB — CBC
MCH: 31 pg (ref 26.0–34.0)
MCHC: 34.2 g/dL (ref 30.0–36.0)
Platelets: 217 10*3/uL (ref 150–400)
RBC: 5.32 MIL/uL (ref 4.22–5.81)
RDW: 12.6 % (ref 11.5–15.5)

## 2012-05-10 LAB — SURGICAL PCR SCREEN
MRSA, PCR: NEGATIVE
Staphylococcus aureus: NEGATIVE

## 2012-05-10 NOTE — Progress Notes (Signed)
Patient informed Nurse that he had a stress test years ago but was unaware of where it was performed at. Patient denied having a cardiac cath or sleep study.

## 2012-05-14 MED ORDER — DEXTROSE 5 % IV SOLN
3.0000 g | INTRAVENOUS | Status: AC
  Administered 2012-05-15: 3 g via INTRAVENOUS
  Filled 2012-05-14: qty 3000

## 2012-05-15 ENCOUNTER — Encounter (HOSPITAL_COMMUNITY): Payer: Self-pay | Admitting: Anesthesiology

## 2012-05-15 ENCOUNTER — Ambulatory Visit (HOSPITAL_COMMUNITY)
Admission: RE | Admit: 2012-05-15 | Discharge: 2012-05-15 | Disposition: A | Discharge status: Home / Self Care | Payer: Self-pay | Source: Ambulatory Visit | Attending: Surgery | Admitting: Surgery

## 2012-05-15 ENCOUNTER — Encounter (HOSPITAL_COMMUNITY)
Admission: RE | Disposition: A | Discharge status: Home / Self Care | Payer: Self-pay | Source: Ambulatory Visit | Attending: Surgery

## 2012-05-15 ENCOUNTER — Ambulatory Visit (HOSPITAL_COMMUNITY): Payer: Self-pay | Admitting: Anesthesiology

## 2012-05-15 DIAGNOSIS — I10 Essential (primary) hypertension: Secondary | ICD-10-CM | POA: Insufficient documentation

## 2012-05-15 DIAGNOSIS — Z01812 Encounter for preprocedural laboratory examination: Secondary | ICD-10-CM | POA: Insufficient documentation

## 2012-05-15 DIAGNOSIS — M25519 Pain in unspecified shoulder: Secondary | ICD-10-CM

## 2012-05-15 DIAGNOSIS — J4489 Other specified chronic obstructive pulmonary disease: Secondary | ICD-10-CM | POA: Insufficient documentation

## 2012-05-15 DIAGNOSIS — R51 Headache: Secondary | ICD-10-CM

## 2012-05-15 DIAGNOSIS — K420 Umbilical hernia with obstruction, without gangrene: Secondary | ICD-10-CM

## 2012-05-15 DIAGNOSIS — M5412 Radiculopathy, cervical region: Secondary | ICD-10-CM

## 2012-05-15 DIAGNOSIS — R21 Rash and other nonspecific skin eruption: Secondary | ICD-10-CM

## 2012-05-15 DIAGNOSIS — F17200 Nicotine dependence, unspecified, uncomplicated: Secondary | ICD-10-CM | POA: Insufficient documentation

## 2012-05-15 DIAGNOSIS — J449 Chronic obstructive pulmonary disease, unspecified: Secondary | ICD-10-CM | POA: Insufficient documentation

## 2012-05-15 DIAGNOSIS — M25569 Pain in unspecified knee: Secondary | ICD-10-CM

## 2012-05-15 DIAGNOSIS — J309 Allergic rhinitis, unspecified: Secondary | ICD-10-CM

## 2012-05-15 HISTORY — PX: UMBILICAL HERNIA REPAIR: SHX196

## 2012-05-15 SURGERY — REPAIR, HERNIA, UMBILICAL, ADULT
Anesthesia: General | Site: Abdomen | Wound class: Clean

## 2012-05-15 MED ORDER — OXYCODONE-ACETAMINOPHEN 10-325 MG PO TABS: 1.0000 | ORAL_TABLET | Freq: Four times a day (QID) | ORAL | Status: DC | PRN

## 2012-05-15 MED ORDER — ONDANSETRON HCL 4 MG/2ML IJ SOLN
INTRAMUSCULAR | Status: DC | PRN
  Administered 2012-05-15: 4 mg via INTRAVENOUS

## 2012-05-15 MED ORDER — FENTANYL CITRATE 0.05 MG/ML IJ SOLN
INTRAMUSCULAR | Status: DC | PRN
  Administered 2012-05-15 (×5): 50 ug via INTRAVENOUS

## 2012-05-15 MED ORDER — HYDROMORPHONE HCL PF 1 MG/ML IJ SOLN
0.2500 mg | INTRAMUSCULAR | Status: DC | PRN
  Administered 2012-05-15 (×3): 0.5 mg via INTRAVENOUS

## 2012-05-15 MED ORDER — ONDANSETRON HCL 4 MG/2ML IJ SOLN: 4.0000 mg | Freq: Once | INTRAMUSCULAR | Status: DC | PRN

## 2012-05-15 MED ORDER — PROPOFOL 10 MG/ML IV EMUL
INTRAVENOUS | Status: DC | PRN
  Administered 2012-05-15: 200 mg via INTRAVENOUS

## 2012-05-15 MED ORDER — BUPIVACAINE-EPINEPHRINE 0.25% -1:200000 IJ SOLN
INTRAMUSCULAR | Status: DC | PRN
  Administered 2012-05-15: 30 mL

## 2012-05-15 MED ORDER — DEXAMETHASONE SODIUM PHOSPHATE 4 MG/ML IJ SOLN
INTRAMUSCULAR | Status: DC | PRN
  Administered 2012-05-15: 4 mg via INTRAVENOUS

## 2012-05-15 MED ORDER — VECURONIUM BROMIDE 10 MG IV SOLR
INTRAVENOUS | Status: DC | PRN
  Administered 2012-05-15: 3 mg via INTRAVENOUS

## 2012-05-15 MED ORDER — LIDOCAINE HCL (CARDIAC) 20 MG/ML IV SOLN
INTRAVENOUS | Status: DC | PRN
  Administered 2012-05-15: 100 mg via INTRAVENOUS

## 2012-05-15 MED ORDER — ACETAMINOPHEN 10 MG/ML IV SOLN
INTRAVENOUS | Status: AC
  Filled 2012-05-15: qty 100

## 2012-05-15 MED ORDER — HYDROMORPHONE HCL PF 1 MG/ML IJ SOLN
INTRAMUSCULAR | Status: AC
  Filled 2012-05-15: qty 1

## 2012-05-15 MED ORDER — BUPIVACAINE-EPINEPHRINE PF 0.25-1:200000 % IJ SOLN
INTRAMUSCULAR | Status: AC
  Filled 2012-05-15: qty 30

## 2012-05-15 MED ORDER — LACTATED RINGERS IV SOLN
INTRAVENOUS | Status: DC | PRN
  Administered 2012-05-15: 07:00:00 via INTRAVENOUS

## 2012-05-15 MED ORDER — LIDOCAINE HCL 4 % MT SOLN
OROMUCOSAL | Status: DC | PRN
  Administered 2012-05-15: 4 mL via TOPICAL

## 2012-05-15 MED ORDER — ALBUTEROL SULFATE (2.5 MG/3ML) 0.083% IN NEBU
INHALATION_SOLUTION | RESPIRATORY_TRACT | Status: DC | PRN
  Administered 2012-05-15: 2.5 mg via RESPIRATORY_TRACT

## 2012-05-15 MED ORDER — ACETAMINOPHEN 10 MG/ML IV SOLN
INTRAVENOUS | Status: DC | PRN
  Administered 2012-05-15: 1000 mg via INTRAVENOUS

## 2012-05-15 MED ORDER — 0.9 % SODIUM CHLORIDE (POUR BTL) OPTIME
TOPICAL | Status: DC | PRN
  Administered 2012-05-15: 1000 mL

## 2012-05-15 MED ORDER — MIDAZOLAM HCL 5 MG/5ML IJ SOLN
INTRAMUSCULAR | Status: DC | PRN
  Administered 2012-05-15: 2 mg via INTRAVENOUS

## 2012-05-15 MED ORDER — ROCURONIUM BROMIDE 100 MG/10ML IV SOLN
INTRAVENOUS | Status: DC | PRN
  Administered 2012-05-15: 50 mg via INTRAVENOUS

## 2012-05-15 MED ORDER — GLYCOPYRROLATE 0.2 MG/ML IJ SOLN
INTRAMUSCULAR | Status: DC | PRN
  Administered 2012-05-15: 1 mg via INTRAVENOUS

## 2012-05-15 MED ORDER — NEOSTIGMINE METHYLSULFATE 1 MG/ML IJ SOLN
INTRAMUSCULAR | Status: DC | PRN
  Administered 2012-05-15: 5 mg via INTRAVENOUS

## 2012-05-15 SURGICAL SUPPLY — 46 items
BLADE SURG 10 STRL SS (BLADE) IMPLANT
BLADE SURG 15 STRL LF DISP TIS (BLADE) IMPLANT
BLADE SURG 15 STRL SS (BLADE)
CANISTER SUCTION 2500CC (MISCELLANEOUS) IMPLANT
CHLORAPREP W/TINT 26ML (MISCELLANEOUS) ×2 IMPLANT
CLOTH BEACON ORANGE TIMEOUT ST (SAFETY) ×2 IMPLANT
COTTONBALL LRG STERILE PKG (GAUZE/BANDAGES/DRESSINGS) IMPLANT
COVER SURGICAL LIGHT HANDLE (MISCELLANEOUS) ×2 IMPLANT
DECANTER SPIKE VIAL GLASS SM (MISCELLANEOUS) ×2 IMPLANT
DERMABOND ADVANCED (GAUZE/BANDAGES/DRESSINGS) ×1
DERMABOND ADVANCED .7 DNX12 (GAUZE/BANDAGES/DRESSINGS) ×1 IMPLANT
DRAPE LAPAROSCOPIC ABDOMINAL (DRAPES) ×2 IMPLANT
DRAPE UTILITY 15X26 W/TAPE STR (DRAPE) ×4 IMPLANT
ELECT CAUTERY BLADE 6.4 (BLADE) ×2 IMPLANT
ELECT REM PT RETURN 9FT ADLT (ELECTROSURGICAL) ×2
ELECTRODE REM PT RTRN 9FT ADLT (ELECTROSURGICAL) ×1 IMPLANT
GAUZE SPONGE 4X4 16PLY XRAY LF (GAUZE/BANDAGES/DRESSINGS) ×2 IMPLANT
GLOVE BIO SURGEON STRL SZ8 (GLOVE) ×2 IMPLANT
GLOVE BIOGEL PI IND STRL 6.5 (GLOVE) ×1 IMPLANT
GLOVE BIOGEL PI IND STRL 7.0 (GLOVE) ×2 IMPLANT
GLOVE BIOGEL PI IND STRL 8 (GLOVE) ×1 IMPLANT
GLOVE BIOGEL PI INDICATOR 6.5 (GLOVE) ×1
GLOVE BIOGEL PI INDICATOR 7.0 (GLOVE) ×2
GLOVE BIOGEL PI INDICATOR 8 (GLOVE) ×1
GLOVE ECLIPSE 6.5 STRL STRAW (GLOVE) ×2 IMPLANT
GLOVE SURG SS PI 7.0 STRL IVOR (GLOVE) ×4 IMPLANT
GOWN STRL NON-REIN LRG LVL3 (GOWN DISPOSABLE) ×6 IMPLANT
KIT BASIN OR (CUSTOM PROCEDURE TRAY) ×2 IMPLANT
KIT ROOM TURNOVER OR (KITS) ×2 IMPLANT
NEEDLE HYPO 25GX1X1/2 BEV (NEEDLE) ×2 IMPLANT
NS IRRIG 1000ML POUR BTL (IV SOLUTION) ×2 IMPLANT
PACK GENERAL/GYN (CUSTOM PROCEDURE TRAY) ×2 IMPLANT
PACK SURGICAL SETUP 50X90 (CUSTOM PROCEDURE TRAY) IMPLANT
PAD ARMBOARD 7.5X6 YLW CONV (MISCELLANEOUS) ×2 IMPLANT
PENCIL BUTTON HOLSTER BLD 10FT (ELECTRODE) IMPLANT
SPONGE GAUZE 4X4 12PLY (GAUZE/BANDAGES/DRESSINGS) IMPLANT
SUT MON AB 4-0 PC3 18 (SUTURE) ×2 IMPLANT
SUT NOVA NAB DX-16 0-1 5-0 T12 (SUTURE) ×2 IMPLANT
SUT VIC AB 3-0 SH 27 (SUTURE) ×1
SUT VIC AB 3-0 SH 27X BRD (SUTURE) ×1 IMPLANT
SYR CONTROL 10ML LL (SYRINGE) ×2 IMPLANT
TOWEL OR 17X24 6PK STRL BLUE (TOWEL DISPOSABLE) ×2 IMPLANT
TOWEL OR 17X26 10 PK STRL BLUE (TOWEL DISPOSABLE) ×2 IMPLANT
TOWEL OR NON WOVEN STRL DISP B (DISPOSABLE) ×2 IMPLANT
TUBE CONNECTING 12X1/4 (SUCTIONS) IMPLANT
YANKAUER SUCT BULB TIP NO VENT (SUCTIONS) IMPLANT

## 2012-05-15 NOTE — Transfer of Care (Signed)
Immediate Anesthesia Transfer of Care Note  Patient: TEFL teacher  Procedure(s) Performed: Procedure(s) (LRB): HERNIA REPAIR UMBILICAL ADULT (N/A)  Patient Location: PACU  Anesthesia Type: General  Level of Consciousness: awake, oriented and patient cooperative  Airway & Oxygen Therapy: Patient Spontanous Breathing and Patient connected to face mask oxygen  Post-op Assessment: Report given to PACU RN, Post -op Vital signs reviewed and stable and Patient moving all extremities X 4  Post vital signs: Reviewed and stable  Complications: No apparent anesthesia complications

## 2012-05-15 NOTE — Interval H&P Note (Signed)
History and Physical Interval Note:  05/15/2012 7:16 AM  Trevor Thomas  has presented today for surgery, with the diagnosis of umbilical hernia  The various methods of treatment have been discussed with the patient and family. After consideration of risks, benefits and other options for treatment, the patient has consented to  Procedure(s) (LRB): HERNIA REPAIR UMBILICAL ADULT (N/A) as a surgical intervention .  The patient's history has been reviewed, patient examined, no change in status, stable for surgery.  I have reviewed the patient's chart and labs.  Questions were answered to the patient's satisfaction.     Lexis Potenza A.

## 2012-05-15 NOTE — Anesthesia Preprocedure Evaluation (Addendum)
Anesthesia Evaluation  Patient identified by MRN, date of birth, ID band Patient awake    Reviewed: Allergy & Precautions, H&P , NPO status , Patient's Chart, lab work & pertinent test results  Airway Mallampati: I TM Distance: >3 FB Neck ROM: full    Dental  (+) Edentulous Upper and Dental Advisory Given   Pulmonary asthma , COPDCurrent Smoker,          Cardiovascular hypertension, Rhythm:regular Rate:Normal     Neuro/Psych  Headaches,  Neuromuscular disease    GI/Hepatic Neg liver ROS, GERD-  ,  Endo/Other  Morbid obesity  Renal/GU negative Renal ROS     Musculoskeletal   Abdominal   Peds  Hematology   Anesthesia Other Findings   Reproductive/Obstetrics                         Anesthesia Physical Anesthesia Plan  ASA: III  Anesthesia Plan: General   Post-op Pain Management:    Induction: Intravenous  Airway Management Planned: Oral ETT  Additional Equipment:   Intra-op Plan:   Post-operative Plan: Extubation in OR  Informed Consent: I have reviewed the patients History and Physical, chart, labs and discussed the procedure including the risks, benefits and alternatives for the proposed anesthesia with the patient or authorized representative who has indicated his/her understanding and acceptance.   Dental advisory given  Plan Discussed with: CRNA, Anesthesiologist and Surgeon  Anesthesia Plan Comments:        Anesthesia Quick Evaluation

## 2012-05-15 NOTE — Preoperative (Signed)
Beta Blockers   Reason not to administer Beta Blockers:Not Applicable 

## 2012-05-15 NOTE — Anesthesia Postprocedure Evaluation (Signed)
  Anesthesia Post-op Note  Patient: Trevor Thomas  Procedure(s) Performed: Procedure(s) (LRB): HERNIA REPAIR UMBILICAL ADULT (N/A)  Patient Location: PACU  Anesthesia Type: General  Level of Consciousness: awake, alert , oriented and patient cooperative  Airway and Oxygen Therapy: Patient Spontanous Breathing  Post-op Pain: mild  Post-op Assessment: Post-op Vital signs reviewed, Patient's Cardiovascular Status Stable, Respiratory Function Stable, Patent Airway, No signs of Nausea or vomiting and Pain level controlled  Post-op Vital Signs: stable  Complications: No apparent anesthesia complications

## 2012-05-15 NOTE — Op Note (Signed)
Preoperative diagnosis: Umbilical hernia incarcerated  Postoperative diagnosis: Umbilical hernia with incarcerated preperitoneal fat  Procedure: Primary repair of umbilical hernia  Surgeon: Harriette Bouillon M.D.  Anesthesia: Gen. Endotracheal anesthesia with 0.25% Sensorcaine local with epinephrine  Specimen: None  EBL: Less than 10 cc  Drains: None  IV fluids: 800  cc crystalloid  Indications for procedure: The patient presents with painful umbilical hernia. He wishes to have a repaired.The risk of hernia repair include bleeding,  Infection,   Recurrence of the hernia,  Mesh use, chronic pain,  Organ injury,  Bowel injury,  Bladder injury,   nerve injury with numbness around the incision,  Death,  and worsening of preexisting  medical problems.  The alternatives to surgery have been discussed as well..  Long term expectations of both operative and non operative treatments have been discussed.   The patient agrees to proceed.  Description of procedure: Patient met in holding area and questions answered. He was then taken back to the operating room placed upon the operating room table. After induction of general endotracheal anesthesia, the periumbilical region was prepped and draped in a sterile fashion. Timeout was done. He received appropriate antibiotics. Local anesthesia was infiltrated around the umbilicus. Curvilinear incision made at the base the umbilicus. Hernia sac was identified and dissected from the undersurface of the umbilicus. The defect in total measured 1 cm. Incarcerated preperitoneal fat was reduced. Fascia closed with interrupted #1 Novafil suture. 3-0 Vicryl was used to secure the umbilicus down to the fascia. 4 Monocryl used to close the skin a septic or fashion. Dermabond applied. All final counts found to be correct sponge, needle and instruments. The patient was awoke extubated taken recovery in satisfactory condition.

## 2012-05-15 NOTE — H&P (View-Only) (Signed)
Patient ID: Trevor Thomas, male   DOB: 11/01/1965, 46 y.o.   MRN: [id number]  No chief complaint on file.   HPI Trevor Thomas is a 46 y.o. male.   HPIPatient sent at the request of Dr. VOLLMER due to pain at umbilicus. He has been evaluated in the emergency room and by exam and CT scan was found to have a painful fat-containing umbilical hernia. He has had problems for 2-3 months with periumbilical pain. It is made worse with straining with bowel movements and coughing. He is a heavy smoker. He does at some mild to moderate constipation. The area is tender to push on. Nausea and vomiting.  Past Medical History  Diagnosis Date  . Hypertension   . COPD (chronic obstructive pulmonary disease)     Past Surgical History  Procedure Date  . Knee surgery   . Thumb surgery (left)   . Eye surgery     No family history on file.  Social History History  Substance Use Topics  . Smoking status: Current Everyday Smoker -- 1.0 packs/day  . Smokeless tobacco: Not on file  . Alcohol Use: Yes     rarely     No Known Allergies  Current Outpatient Prescriptions  Medication Sig Dispense Refill  . cyclobenzaprine (FLEXERIL) 10 MG tablet Take 10 mg by mouth 3 (three) times daily as needed. For muscle spasm.      . HYDROcodone-acetaminophen (NORCO/VICODIN) 5-325 MG per tablet Take 1-2 tablets by mouth every 4 (four) hours as needed for pain.  20 tablet  0  . ibuprofen (ADVIL,MOTRIN) 200 MG tablet Take 600 mg by mouth every 8 (eight) hours as needed. For pain.      . lisinopril (PRINIVIL,ZESTRIL) 10 MG tablet Take 10 mg by mouth daily.      . loratadine (CLARITIN) 10 MG tablet Take 10 mg by mouth daily.      . oxyCODONE-acetaminophen (PERCOCET) 5-325 MG per tablet Take 2 tablets by mouth 2 (two) times daily.      . promethazine (PHENERGAN) 25 MG tablet Take 1 tablet (25 mg total) by mouth every 6 (six) hours as needed for nausea.  20 tablet  0    Review of Systems Review of Systems    Constitutional: Positive for fatigue.  HENT: Negative.   Eyes: Negative.   Respiratory: Positive for cough.   Cardiovascular: Negative.   Gastrointestinal: Positive for abdominal pain.  Genitourinary: Negative.   Musculoskeletal: Negative.   Skin: Negative.   Neurological: Negative.   Hematological: Negative.   Psychiatric/Behavioral: Negative.     Blood pressure 140/86, pulse 88, temperature 98.2 F (36.8 C), resp. rate 16, height 5' 11" (1.803 m), weight 262 lb (118.842 kg).  Physical Exam Physical Exam  Constitutional: He is oriented to person, place, and time. He appears well-developed and well-nourished.  HENT:  Head: Normocephalic and atraumatic.  Eyes: EOM are normal. Pupils are equal, round, and reactive to light.  Neck: Normal range of motion. Neck supple.  Cardiovascular: Normal rate and regular rhythm.   Pulmonary/Chest: He has wheezes.  Abdominal:    Musculoskeletal: Normal range of motion.  Neurological: He is alert and oriented to person, place, and time.  Skin: Skin is warm and dry.  Psychiatric: He has a normal mood and affect. His behavior is normal. Judgment and thought content normal.    Data Reviewed    Clinical Data: Abdominal pain umbilical hernia, nausea, vomiting,  weakness  CT ABDOMEN AND PELVIS WITH CONTRAST  Technique: Multidetector   CT imaging of the abdomen and pelvis was  performed following the standard protocol during bolus  administration of intravenous contrast.  Contrast: 100mL OMNIPAQUE IOHEXOL 300 MG/ML SOLN  Comparison: 03/08/2012  Findings: Minimal inferior lingula and right middle lobe  atelectasis / scarring anteriorly. Dependent basilar atelectasis  posteriorly. No lower lobe pneumonia. Normal heart size. No  pericardial or pleural effusion. Negative for hiatal hernia.  Abdomen: Mild hypoattenuation of the liver parenchyma suspect  fatty infiltration or hepatic steatosis with some fatty sparing  along the gallbladder. No  focal hepatic abnormality or biliary  dilatation. Gallbladder, biliary system, pancreas, spleen, adrenal  glands, and kidneys are within normal limits for age and  demonstrate no acute process.  Negative for bowel obstruction, dilatation, ileus, or free air.  No abdominal free fluid, fluid collection, hemorrhage, adenopathy,  or abscess.  Normal appendix in the right lower quadrant.  Pelvis: No pelvic free fluid, fluid collection, hemorrhage,  adenopathy, abscess, or inguinal abnormality. Urinary bladder and  distal ureters unremarkable.  Small fat containing umbilical hernia only measuring 16 mm in  diameter, image 55.  Mild diffuse degenerative changes of the spine and SI joints.  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  IMPRESSION:  Small fat containing umbilical hernia. No associated bowel  herniation or obstruction.  Hepatic steatosis.  Normal appendix.  No acute intra-abdominal or pelvic process  Original Report Authenticated By: [name], M.D.                Assessment    Umbilical hernia reducible symptomatic  COPD  Tobacco abuse     Plan    Recommend surgical repair. Risks, benefits and alternative therapies discussed. High risk for wound infection, recurrence, and need for further procedures due to tobacco abuse. Counseled him about quitting smoking and the consequences of not doing so with surgery. Risks of bleeding, infection, bowel injury, scar tissue formation, hernia recurrence, loss of skin of her umbilicus, pulmonary complications, cardiovascular patient, DVT, myocardial portion, death, and further surgery discussed. The risk of hernia repair include bleeding,  Infection,   Recurrence of the hernia,  Mesh use, chronic pain,  Organ injury,  Bowel injury,  Bladder injury,   nerve injury with numbness around the incision,  Death,  and worsening of preexisting  medical problems.  The alternatives to surgery have been  discussed as well..  Long term expectations of both operative and non operative treatments have been discussed.   The patient agrees to proceed.       Trevor Thomas A. 05/01/2012, 10:47 AM    

## 2012-05-17 ENCOUNTER — Encounter (HOSPITAL_COMMUNITY): Payer: Self-pay | Admitting: Surgery

## 2012-05-17 NOTE — Progress Notes (Signed)
Patient was asked during follow up call if he had difficulty breathing and he stated "yes". He stated he has a history of emphysema and smokes. He uses an inhaler. He was asked if he was having more trouble than prior to surgery and he stated he was (but is not gasping for breath). I instructed him to call his doctor, in which he stated he did not have one. I then instructed him to call his surgeon and 911 if he felt unable to breathe. Patient complained of burning pain, but that the pain prescription he was given eased the burning slightly. I instructed the patient to use a pillow to splint his incision when coughing and/or sneezing and to call his surgeon if the pain persists or was not relieved by pain meds. He verbalized understanding.

## 2012-05-22 ENCOUNTER — Telehealth (INDEPENDENT_AMBULATORY_CARE_PROVIDER_SITE_OTHER): Payer: Self-pay

## 2012-05-22 NOTE — Telephone Encounter (Signed)
The patient's fiance called to report the pt is having burning pain.  He hit his incision and he needs more Percocet.  I offered the refill protocol and asked his pharmacy.  She put the pt on the phone.  The pt states he hit the incision on part of a door and it's been burning and stinging and nothing he has tried has helped.  He also states it is draining pus.  I told him he needs to be seen and gave an appt for tomorrow.  I called in the refill protocol to [facility] on [phone].  Hydrocodone 5/325 one tab po q 4-6 hrs prn pain #30 no refills.

## 2012-05-23 ENCOUNTER — Ambulatory Visit (INDEPENDENT_AMBULATORY_CARE_PROVIDER_SITE_OTHER): Payer: PRIVATE HEALTH INSURANCE | Admitting: Surgery

## 2012-05-23 ENCOUNTER — Encounter (INDEPENDENT_AMBULATORY_CARE_PROVIDER_SITE_OTHER): Payer: Self-pay | Admitting: Surgery

## 2012-05-23 VITALS — BP 160/104 | HR 100 | Temp 97.0°F | Resp 20 | Ht 71.0 in | Wt 260.4 lb

## 2012-05-23 DIAGNOSIS — Z9889 Other specified postprocedural states: Secondary | ICD-10-CM

## 2012-05-23 NOTE — Progress Notes (Signed)
Patient returns for postop check after umbilical hernia repair. He slammed a hood on his incision and he had some yellow drainage noted by his wife. The area is sore. No fever or chills.  Exam: Incision clean dry intact. No redness or fluctuants. Hernia repair intact.  Impression: Status post umbilical hernia repair with drainage from incision secondary to fat necrosis no signs of infection  Plan: Return in 10 days. Refrain from slamming the hood into the bellybutton. Refrain from heavy lifting.

## 2012-05-23 NOTE — Patient Instructions (Signed)
Avoid fatty greasy foods with pain meds.Return 10 days.

## 2012-05-30 ENCOUNTER — Ambulatory Visit (INDEPENDENT_AMBULATORY_CARE_PROVIDER_SITE_OTHER): Payer: PRIVATE HEALTH INSURANCE | Admitting: Surgery

## 2012-05-30 ENCOUNTER — Encounter (INDEPENDENT_AMBULATORY_CARE_PROVIDER_SITE_OTHER): Payer: Self-pay | Admitting: Surgery

## 2012-05-30 VITALS — BP 158/94 | HR 116 | Temp 97.5°F | Resp 20 | Ht 71.0 in | Wt 263.0 lb

## 2012-05-30 DIAGNOSIS — Z9889 Other specified postprocedural states: Secondary | ICD-10-CM

## 2012-05-30 NOTE — Patient Instructions (Signed)
No lifting.  Return 2 weeks

## 2012-05-30 NOTE — Progress Notes (Signed)
Patient returns for recheck after umbilical hernia repair. He still has discomfort around the umbilicus.  Exam: The periumbilical incision appears to be healing well. There is no drainage. There is thickened skin in the umbilicus but no signs of infection or pus sort outpatient sore to palpation  Impression: Status post umbilical hernia repair  Plan: Refrain from any lifting for 2 weeks. Return to clinic in 2 weeks.

## 2012-06-02 ENCOUNTER — Encounter (HOSPITAL_COMMUNITY): Payer: Self-pay | Admitting: Emergency Medicine

## 2012-06-02 ENCOUNTER — Emergency Department (HOSPITAL_COMMUNITY)
Admission: EM | Admit: 2012-06-02 | Discharge: 2012-06-02 | Disposition: A | Discharge status: Home / Self Care | Payer: Self-pay | Attending: Pediatrics | Admitting: Pediatrics

## 2012-06-02 ENCOUNTER — Emergency Department (HOSPITAL_COMMUNITY): Payer: Self-pay

## 2012-06-02 DIAGNOSIS — K219 Gastro-esophageal reflux disease without esophagitis: Secondary | ICD-10-CM | POA: Insufficient documentation

## 2012-06-02 DIAGNOSIS — I10 Essential (primary) hypertension: Secondary | ICD-10-CM | POA: Insufficient documentation

## 2012-06-02 DIAGNOSIS — F17200 Nicotine dependence, unspecified, uncomplicated: Secondary | ICD-10-CM | POA: Insufficient documentation

## 2012-06-02 DIAGNOSIS — F3289 Other specified depressive episodes: Secondary | ICD-10-CM | POA: Insufficient documentation

## 2012-06-02 DIAGNOSIS — J45909 Unspecified asthma, uncomplicated: Secondary | ICD-10-CM | POA: Insufficient documentation

## 2012-06-02 DIAGNOSIS — F329 Major depressive disorder, single episode, unspecified: Secondary | ICD-10-CM | POA: Insufficient documentation

## 2012-06-02 DIAGNOSIS — W010XXA Fall on same level from slipping, tripping and stumbling without subsequent striking against object, initial encounter: Secondary | ICD-10-CM | POA: Insufficient documentation

## 2012-06-02 DIAGNOSIS — S93409A Sprain of unspecified ligament of unspecified ankle, initial encounter: Secondary | ICD-10-CM | POA: Insufficient documentation

## 2012-06-02 DIAGNOSIS — F411 Generalized anxiety disorder: Secondary | ICD-10-CM | POA: Insufficient documentation

## 2012-06-02 MED ORDER — OXYCODONE-ACETAMINOPHEN 5-325 MG PO TABS
ORAL_TABLET | ORAL | Status: AC
Start: 2012-06-02 — End: 2012-06-12

## 2012-06-02 MED ORDER — OXYCODONE-ACETAMINOPHEN 5-325 MG PO TABS
2.0000 | ORAL_TABLET | Freq: Once | ORAL | Status: AC
  Administered 2012-06-02: 2 via ORAL
  Filled 2012-06-02: qty 2

## 2012-06-02 MED ORDER — LISINOPRIL 10 MG PO TABS: 10.0000 mg | ORAL_TABLET | Freq: Every day | ORAL | Status: DC

## 2012-06-02 NOTE — ED Notes (Signed)
Pt to xray

## 2012-06-02 NOTE — ED Provider Notes (Signed)
History     CSN: [id number]  Arrival date & time 06/02/12  1404   First MD Initiated Contact with Patient 06/02/12 1426      Chief Complaint  Patient presents with  . Fall  . Foot Pain    (Consider location/radiation/quality/duration/timing/severity/associated sxs/prior treatment) HPI  46 y.o. male in no acute distress complaining of pain to left ankle status post slip and fall 2 days ago. Pain is extreme tenderness and exacerbated by weightbearing described as sharp. Denies any true numbness however patient does have a continual paresthesia distal to swelling.   Past Medical History  Diagnosis Date  . Hypertension   . COPD (chronic obstructive pulmonary disease)   . Bronchitis     hx of  . Asthma   . Concussion 1983  . Headache   . Anxiety   . Depression   . Umbilical hernia   . GERD (gastroesophageal reflux disease)     takes tums/rolaids  . Arthritis     Past Surgical History  Procedure Date  . Knee surgery   . Thumb surgery (left)   . Eye surgery   . Umbilical hernia repair 05/15/2012    Procedure: HERNIA REPAIR UMBILICAL ADULT;  Surgeon: Clovis Pu. Cornett, MD;  Location: [facility] OR;  Service: General;  Laterality: N/A;  . Hernia repair 05/2012    umbilical hernia    No family history on file.  History  Substance Use Topics  . Smoking status: Current Everyday Smoker -- 1.0 packs/day for 20 years  . Smokeless tobacco: Never Used  . Alcohol Use: Yes     rarely       Review of Systems  Constitutional: Negative for fever.  Respiratory: Negative for shortness of breath.   Cardiovascular: Negative for chest pain.  Gastrointestinal: Negative for nausea, vomiting, abdominal pain and diarrhea.  Musculoskeletal: Positive for arthralgias.  All other systems reviewed and are negative.    Allergies  Review of patient's allergies indicates no known allergies.  Home Medications   Current Outpatient Rx  Name Route Sig Dispense Refill  . CYCLOBENZAPRINE HCL  10 MG PO TABS Oral Take 10 mg by mouth 3 (three) times daily as needed. For muscle spasm.    . IBUPROFEN 200 MG PO TABS Oral Take 600 mg by mouth every 8 (eight) hours as needed. For pain.    Marland Kitchen LISINOPRIL 10 MG PO TABS Oral Take 10 mg by mouth daily.    Marland Kitchen LORATADINE 10 MG PO TABS Oral Take 10 mg by mouth daily.    . OXYCODONE-ACETAMINOPHEN 10-325 MG PO TABS Oral Take 1 tablet by mouth every 6 (six) hours as needed for pain. 30 tablet 0  . LISINOPRIL 10 MG PO TABS Oral Take 1 tablet (10 mg total) by mouth daily. 30 tablet 3  . OXYCODONE-ACETAMINOPHEN 5-325 MG PO TABS  1 to 2 tabs PO q6hrs  PRN for pain 15 tablet 0    BP 149/102  Pulse 120  Temp 97.6 F (36.4 C) (Oral)  SpO2 98%  Physical Exam  Nursing note and vitals reviewed. Constitutional: He is oriented to person, place, and time. He appears well-developed and well-nourished. No distress.  HENT:  Head: Normocephalic.  Eyes: Conjunctivae and EOM are normal. Pupils are equal, round, and reactive to light.  Neck: Normal range of motion.  Cardiovascular: Normal rate.   Pulmonary/Chest: Effort normal.  Abdominal: Soft.  Musculoskeletal: Normal range of motion.       Moderate swelling to inferior lateral malleolus of  the left ankle. Dorsalis pedis 2+ distal sensation grossly intact.  Neurological: He is alert and oriented to person, place, and time.  Skin: Skin is warm.  Psychiatric: He has a normal mood and affect.    ED Course  Procedures (including critical care time)  Labs Reviewed - No data to display Dg Ankle Complete Left  06/02/2012  *RADIOLOGY REPORT*  Clinical Data: Fall.  Ankle pain.  LEFT ANKLE COMPLETE - 3+ VIEW  Comparison: None.  Findings: Healed posterior malleolar fracture is present.  Old well corticated avulsion from the lateral process of the talus.  Ankle osteoarthritis is present.  No displaced fracture is identified. The ankle mortise is congruent and the talar dome appears intact. There is an ankle effusion.   IMPRESSION: Healed ankle fractures.  No acute osseous injury.   Original Report Authenticated By: Andreas Newport, M.D.      1. Ankle sprain       MDM  Uncomplicated ankle sprain: I will give the patient crutches instructed RICE. I will refill the patient's fosinopril as they are a [facility] patient and will in up shortly. Resource guide given.  Pt verbalized understanding and agrees with care plan. Outpatient follow-up and return precautions given.            Wynetta Emery, PA-C 06/02/12 1512

## 2012-06-02 NOTE — ED Notes (Signed)
Pt presents w/ painful, swollen left ankle, states fell and twisted it 2 days ago. Also states right foot is painful but has no injury. Having trouble w/ feeling in both feet, especially in the morning both feet feel numb.

## 2012-06-12 ENCOUNTER — Encounter (INDEPENDENT_AMBULATORY_CARE_PROVIDER_SITE_OTHER): Payer: PRIVATE HEALTH INSURANCE | Admitting: Surgery

## 2012-06-16 NOTE — ED Provider Notes (Signed)
Medical screening examination/treatment/procedure(s) were performed by non-physician practitioner and as supervising physician I was immediately available for consultation/collaboration.  Donnetta Hutching, MD 06/16/12 [id number]

## 2012-06-19 ENCOUNTER — Encounter (HOSPITAL_COMMUNITY): Payer: Self-pay | Admitting: Emergency Medicine

## 2012-06-19 ENCOUNTER — Encounter (HOSPITAL_COMMUNITY): Payer: Self-pay | Admitting: Family Medicine

## 2012-06-19 ENCOUNTER — Emergency Department (HOSPITAL_COMMUNITY): Payer: Self-pay

## 2012-06-19 ENCOUNTER — Emergency Department (INDEPENDENT_AMBULATORY_CARE_PROVIDER_SITE_OTHER)
Admission: EM | Admit: 2012-06-19 | Discharge: 2012-06-19 | Disposition: A | Discharge status: Home / Self Care | Payer: Self-pay | Source: Home / Self Care | Attending: Family Medicine | Admitting: Family Medicine

## 2012-06-19 ENCOUNTER — Emergency Department (HOSPITAL_COMMUNITY)
Admission: EM | Admit: 2012-06-19 | Discharge: 2012-06-19 | Discharge status: Against Medical Advice | Payer: Self-pay | Attending: Emergency Medicine | Admitting: Emergency Medicine

## 2012-06-19 DIAGNOSIS — Z79899 Other long term (current) drug therapy: Secondary | ICD-10-CM | POA: Insufficient documentation

## 2012-06-19 DIAGNOSIS — I200 Unstable angina: Secondary | ICD-10-CM | POA: Insufficient documentation

## 2012-06-19 DIAGNOSIS — S0990XA Unspecified injury of head, initial encounter: Secondary | ICD-10-CM

## 2012-06-19 DIAGNOSIS — J449 Chronic obstructive pulmonary disease, unspecified: Secondary | ICD-10-CM | POA: Insufficient documentation

## 2012-06-19 DIAGNOSIS — R0602 Shortness of breath: Secondary | ICD-10-CM | POA: Insufficient documentation

## 2012-06-19 DIAGNOSIS — R110 Nausea: Secondary | ICD-10-CM | POA: Insufficient documentation

## 2012-06-19 DIAGNOSIS — I10 Essential (primary) hypertension: Secondary | ICD-10-CM | POA: Insufficient documentation

## 2012-06-19 DIAGNOSIS — J4489 Other specified chronic obstructive pulmonary disease: Secondary | ICD-10-CM | POA: Insufficient documentation

## 2012-06-19 LAB — CBC
MCH: 31 pg (ref 26.0–34.0)
MCHC: 34.8 g/dL (ref 30.0–36.0)
MCV: 89.2 fL (ref 78.0–100.0)
Platelets: 234 10*3/uL (ref 150–400)
RDW: 12.4 % (ref 11.5–15.5)

## 2012-06-19 LAB — PRO B NATRIURETIC PEPTIDE: Pro B Natriuretic peptide (BNP): 5.2 pg/mL (ref 0–125)

## 2012-06-19 LAB — BASIC METABOLIC PANEL
CO2: 26 mEq/L (ref 19–32)
Calcium: 9.5 mg/dL (ref 8.4–10.5)
Creatinine, Ser: 0.73 mg/dL (ref 0.50–1.35)
GFR calc Af Amer: >90 mL/min (ref >90)
GFR calc non Af Amer: >90 mL/min (ref >90)

## 2012-06-19 MED ORDER — ACETAMINOPHEN 325 MG PO TABS
650.0000 mg | ORAL_TABLET | Freq: Once | ORAL | Status: AC
  Administered 2012-06-19: 650 mg via ORAL
  Filled 2012-06-19: qty 2

## 2012-06-19 MED ORDER — SODIUM CHLORIDE 0.9 % IV SOLN: INTRAVENOUS | Status: DC

## 2012-06-19 MED ORDER — IBUPROFEN 800 MG PO TABS
800.0000 mg | ORAL_TABLET | Freq: Once | ORAL | Status: AC
  Administered 2012-06-19: 800 mg via ORAL
  Filled 2012-06-19: qty 1

## 2012-06-19 MED ORDER — ASPIRIN 81 MG PO CHEW
324.0000 mg | CHEWABLE_TABLET | Freq: Once | ORAL | Status: AC
  Administered 2012-06-19: 324 mg via ORAL
  Filled 2012-06-19: qty 4

## 2012-06-19 MED ORDER — DULOXETINE HCL 30 MG PO CPEP: 30.0000 mg | ORAL_CAPSULE | Freq: Every day | ORAL | Status: DC

## 2012-06-19 MED ORDER — MORPHINE SULFATE 4 MG/ML IJ SOLN
4.0000 mg | Freq: Once | INTRAMUSCULAR | Status: AC
  Administered 2012-06-19: 4 mg via INTRAVENOUS
  Filled 2012-06-19: qty 1

## 2012-06-19 MED ORDER — ONDANSETRON HCL 4 MG/2ML IJ SOLN
4.0000 mg | Freq: Once | INTRAMUSCULAR | Status: AC
  Administered 2012-06-19: 4 mg via INTRAVENOUS
  Filled 2012-06-19: qty 2

## 2012-06-19 MED ORDER — NITROGLYCERIN 0.4 MG SL SUBL
0.4000 mg | SUBLINGUAL_TABLET | SUBLINGUAL | Status: DC | PRN
  Administered 2012-06-19: 0.4 mg via SUBLINGUAL
  Filled 2012-06-19: qty 25

## 2012-06-19 NOTE — ED Notes (Signed)
Dr. Weldon Inches at bedside

## 2012-06-19 NOTE — Discharge Instructions (Signed)
Stop smoking.  Your symptoms are very concerning for a heart attack.  Take an aspirin daily.  Return for recurrence of your symptoms, or if you decide that you would like evaluation and treatment.  For your chest.  Pain

## 2012-06-19 NOTE — ED Provider Notes (Addendum)
History     CSN: [id number]  Arrival date & time 06/19/12  1724   First MD Initiated Contact with Patient 06/19/12 1752      Chief Complaint  Patient presents with  . Chest Pain    (Consider location/radiation/quality/duration/timing/severity/associated sxs/prior treatment) The history is provided by the patient and the spouse.   46 year old, male, smoker, with a history of hypertension, and COPD, presents to emergency department complaining of chest pain, which he describes as boulder sitting on his chest along with shortness of breath, nausea, diaphoresis.  He states that chest pain.  That radiates into his left shoulder.  He denies fevers, or chills.  He has had a cough, with green sputum.  He denies leg pain or swelling.  He has never had similar symptoms in the past.  Past Medical History  Diagnosis Date  . Hypertension   . COPD (chronic obstructive pulmonary disease)   . Bronchitis     hx of  . Asthma   . Concussion 1983  . Headache   . Anxiety   . Depression   . Umbilical hernia   . GERD (gastroesophageal reflux disease)     takes tums/rolaids  . Arthritis     Past Surgical History  Procedure Date  . Knee surgery   . Thumb surgery (left)   . Eye surgery   . Umbilical hernia repair 05/15/2012    Procedure: HERNIA REPAIR UMBILICAL ADULT;  Surgeon: Clovis Pu. Cornett, MD;  Location: [facility] OR;  Service: General;  Laterality: N/A;  . Hernia repair 05/2012    umbilical hernia    History reviewed. No pertinent family history.  History  Substance Use Topics  . Smoking status: Current Every Day Smoker -- 1.0 packs/day for 20 years  . Smokeless tobacco: Never Used  . Alcohol Use: Yes     rarely       Review of Systems  Constitutional: Positive for diaphoresis. Negative for fever and chills.  HENT: Negative for neck pain.   Respiratory: Positive for cough and shortness of breath. Negative for chest tightness.   Cardiovascular: Positive for chest pain. Negative for  palpitations and leg swelling.       Chest pain, described as boulder is on his chest  Gastrointestinal: Positive for nausea. Negative for vomiting and abdominal pain.  All other systems reviewed and are negative.    Allergies  Review of patient's allergies indicates no known allergies.  Home Medications   Current Outpatient Rx  Name Route Sig Dispense Refill  . CYCLOBENZAPRINE HCL 10 MG PO TABS Oral Take 10 mg by mouth 3 (three) times daily as needed. For muscle spasm.    . IBUPROFEN 200 MG PO TABS Oral Take 600 mg by mouth every 8 (eight) hours as needed. For pain.    Marland Kitchen LISINOPRIL 10 MG PO TABS Oral Take 10 mg by mouth daily.    Marland Kitchen LORATADINE 10 MG PO TABS Oral Take 10 mg by mouth daily.    . OXYCODONE-ACETAMINOPHEN 10-325 MG PO TABS Oral Take 1 tablet by mouth every 6 (six) hours as needed for pain. 30 tablet 0    BP 153/107  Temp 98.3 F (36.8 C) (Oral)  Resp 19  SpO2 97%  Physical Exam  Nursing note and vitals reviewed. Constitutional: He is oriented to person, place, and time. He appears well-developed and well-nourished. No distress.  HENT:  Head: Normocephalic and atraumatic.  Eyes: Conjunctivae normal and EOM are normal.  Neck: Normal range of motion. Neck  supple.  Cardiovascular: Normal rate, regular rhythm and intact distal pulses.   No murmur heard. Pulmonary/Chest: Effort normal and breath sounds normal.  Abdominal: Soft. Bowel sounds are normal. He exhibits no distension. There is no tenderness.  Musculoskeletal: Normal range of motion. He exhibits no edema and no tenderness.  Neurological: He is alert and oriented to person, place, and time.  Skin: Skin is warm and dry.  Psychiatric: He has a normal mood and affect. Thought content normal.    ED Course  Procedures (including critical care time) 46 year old, male, smoker, presents emergency department with symptoms suggestive of acute coronary syndrome.  We will treat his symptoms and perform EKG, chest  x-ray, and laboratory testing, for further evaluation.  Labs Reviewed  CBC  BASIC METABOLIC PANEL  PRO B NATRIURETIC PEPTIDE  TROPONIN I   No results found.   No diagnosis found.  ECG Normal sinus rhythm at 92 beats per minute. Normal axis. Normal intervals. Normal.  ST and T-wave  7:05 PM Cp resolved. Now has ha.  7:44 PM Spoke with Dr. Conley Rolls. He will admit for eval of unstable ( new ) angina  8:24 PM Pt was seen by [facility], dr. Conley Rolls.  He refuses to stay. Dr. Conley Rolls told me he wants to leave.  i went back to speak with the pt.  I explained our concerns and implored the pt to stay.  Despite my efforts with his wife present, he still refused to stay.  I told him he would have to sign out ama.  He agreed to do so. I explained that if he changes his mind or if his sxs recur, he should return.    MDM  Chest pain        Cheri Guppy, MD 06/19/12 1831  Cheri Guppy, MD 06/19/12 1944  Cheri Guppy, MD 06/19/12 2026

## 2012-06-19 NOTE — ED Provider Notes (Signed)
History     CSN: [id number]  Arrival date & time 06/19/12  1508   First MD Initiated Contact with Patient 06/19/12 1525      Chief Complaint  Patient presents with  . Headache    (Consider location/radiation/quality/duration/timing/severity/associated sxs/prior treatment) Patient is a 46 y.o. male presenting with head injury. The history is provided by the patient.  Head Injury  The incident occurred yesterday (left leg gave out and pt fell striking occipital head, c/o multiple pain issues, tearful.). He came to the ER via walk-in. The injury mechanism was a direct blow. There was no loss of consciousness. There was no blood loss. The quality of the pain is described as sharp. The pain is moderate. Pertinent negatives include no numbness, no vomiting, patient does not experience disorientation, no weakness and no memory loss.    Past Medical History  Diagnosis Date  . Hypertension   . COPD (chronic obstructive pulmonary disease)   . Bronchitis     hx of  . Asthma   . Concussion 1983  . Headache   . Anxiety   . Depression   . Umbilical hernia   . GERD (gastroesophageal reflux disease)     takes tums/rolaids  . Arthritis     Past Surgical History  Procedure Date  . Knee surgery   . Thumb surgery (left)   . Eye surgery   . Umbilical hernia repair 05/15/2012    Procedure: HERNIA REPAIR UMBILICAL ADULT;  Surgeon: Clovis Pu. Cornett, MD;  Location: [facility] OR;  Service: General;  Laterality: N/A;  . Hernia repair 05/2012    umbilical hernia    No family history on file.  History  Substance Use Topics  . Smoking status: Current Every Day Smoker -- 1.0 packs/day for 20 years  . Smokeless tobacco: Never Used  . Alcohol Use: Yes     rarely       Review of Systems  Constitutional: Negative.   Gastrointestinal: Negative for vomiting.  Neurological: Positive for headaches. Negative for weakness and numbness.  Psychiatric/Behavioral: Negative for memory loss.    Allergies   Review of patient's allergies indicates no known allergies.  Home Medications   Current Outpatient Rx  Name Route Sig Dispense Refill  . CYCLOBENZAPRINE HCL 10 MG PO TABS Oral Take 10 mg by mouth 3 (three) times daily as needed. For muscle spasm.    . DULOXETINE HCL 30 MG PO CPEP Oral Take 1 capsule (30 mg total) by mouth daily. 30 capsule 1  . IBUPROFEN 200 MG PO TABS Oral Take 600 mg by mouth every 8 (eight) hours as needed. For pain.    Marland Kitchen LISINOPRIL 10 MG PO TABS Oral Take 10 mg by mouth daily.    Marland Kitchen LISINOPRIL 10 MG PO TABS Oral Take 1 tablet (10 mg total) by mouth daily. 30 tablet 3  . LORATADINE 10 MG PO TABS Oral Take 10 mg by mouth daily.    . OXYCODONE-ACETAMINOPHEN 10-325 MG PO TABS Oral Take 1 tablet by mouth every 6 (six) hours as needed for pain. 30 tablet 0    BP 160/113  Pulse 93  Temp 98.7 F (37.1 C) (Oral)  Resp 21  SpO2 97%  Physical Exam  Nursing note and vitals reviewed. Constitutional: He is oriented to person, place, and time. He appears well-developed and well-nourished.  HENT:  Head: Normocephalic.  Right Ear: External ear normal.  Left Ear: External ear normal.       Occipital sm hematoma., no  bleeding  Eyes: Pupils are equal, round, and reactive to light.  Neurological: He is alert and oriented to person, place, and time.  Skin: Skin is warm and dry.    ED Course  Procedures (including critical care time)  Labs Reviewed - No data to display No results found.   1. Minor head injury       MDM          Linna Hoff, MD 06/19/12 [phone]

## 2012-06-19 NOTE — ED Notes (Addendum)
Patient continues to complain of chest pain.  Dr. Weldon Inches notified.   Patient states that he does not want to be admitted per Dr. Conley Rolls.  Dr. Conley Rolls notified Dr. Weldon Inches.   Patient informed of possible medical consequences of not staying by [place], RN

## 2012-06-19 NOTE — ED Notes (Signed)
Patient is alert and oriented x3.  He continues to have complaint of chest pain rated 8 of 10 that radiates to the left arm. She states that the tylenol did not help much.  V/S stable.  He is slightly diaphoretic.  MD notified

## 2012-06-19 NOTE — ED Notes (Signed)
Reports hitting head yesterday.  Knot on back of head per patient.  Patient says he ran out of pain medicine a month ago.

## 2012-06-19 NOTE — ED Notes (Signed)
Pt reports having left-sided chest pain radiatiing to left arm starting this morning with sob, diaphoresis, n/v.

## 2012-06-20 NOTE — Progress Notes (Signed)
Received a note from [employer] for Trevor Thomas returned call at 1351 CM left voice message for Trevor Thomas to return a call to CM or TCU

## 2012-06-20 NOTE — Progress Notes (Signed)
CM left the voice message for Trevor Thomas at [phone]. CM noted a home number for pt as [phone] A woman answered the line but reports pt "does not live here"

## 2012-06-20 NOTE — Progress Notes (Signed)
Received a return call from Otho Najjar [phone] who reviewed pt 06/19/12 ED visit, [facility] who donna states [facility] not know [facility] but he was receiving [facility] along with his other standard medication. CM explained that the orange card is not an insurance card and was only used for [facility] patients and can not be used at other facilities.  Lupita Leash confirms pt has a list of recommended self pay providers in [place] from [facility] Cm also referred her to [facility] [phone]) voice message since 06/01/12 indicating all medications changed to [facility] Cm provided her with [facility] pharmacy contact number [phone] to inquire if there is a record of pt medications in their system CM explained to donna that there is no one in [place] that will not charge the pt even though he was at "[facility]% for [facility]" Explained to Lupita Leash that [facility] had completed an eligibility assessment on pt to determine "100%" but there is not presently another system like [facility] in [place]  Suggested health dept or dss for pt.  CM explained also that [facility] indigent medication assistance program does not provide assist for controlled medications but could offer assist with non controlled medications.   Lupita Leash voiced understanding.  Lupita Leash inquired about [facility] opening programs CM informed her she did not know any details on what the general public are aware of already.

## 2012-06-26 ENCOUNTER — Encounter (INDEPENDENT_AMBULATORY_CARE_PROVIDER_SITE_OTHER): Payer: PRIVATE HEALTH INSURANCE | Admitting: Surgery

## 2012-06-28 ENCOUNTER — Emergency Department (HOSPITAL_COMMUNITY)
Admission: EM | Admit: 2012-06-28 | Discharge: 2012-06-28 | Disposition: A | Discharge status: Home / Self Care | Payer: Self-pay | Attending: Emergency Medicine | Admitting: Emergency Medicine

## 2012-06-28 ENCOUNTER — Encounter (HOSPITAL_COMMUNITY): Payer: Self-pay | Admitting: Emergency Medicine

## 2012-06-28 DIAGNOSIS — I10 Essential (primary) hypertension: Secondary | ICD-10-CM | POA: Insufficient documentation

## 2012-06-28 DIAGNOSIS — K219 Gastro-esophageal reflux disease without esophagitis: Secondary | ICD-10-CM | POA: Insufficient documentation

## 2012-06-28 DIAGNOSIS — F17200 Nicotine dependence, unspecified, uncomplicated: Secondary | ICD-10-CM | POA: Insufficient documentation

## 2012-06-28 DIAGNOSIS — X58XXXA Exposure to other specified factors, initial encounter: Secondary | ICD-10-CM | POA: Insufficient documentation

## 2012-06-28 DIAGNOSIS — J449 Chronic obstructive pulmonary disease, unspecified: Secondary | ICD-10-CM | POA: Insufficient documentation

## 2012-06-28 DIAGNOSIS — J4489 Other specified chronic obstructive pulmonary disease: Secondary | ICD-10-CM | POA: Insufficient documentation

## 2012-06-28 DIAGNOSIS — F329 Major depressive disorder, single episode, unspecified: Secondary | ICD-10-CM | POA: Insufficient documentation

## 2012-06-28 DIAGNOSIS — B356 Tinea cruris: Secondary | ICD-10-CM

## 2012-06-28 DIAGNOSIS — L98499 Non-pressure chronic ulcer of skin of other sites with unspecified severity: Secondary | ICD-10-CM | POA: Insufficient documentation

## 2012-06-28 DIAGNOSIS — F411 Generalized anxiety disorder: Secondary | ICD-10-CM | POA: Insufficient documentation

## 2012-06-28 DIAGNOSIS — IMO0002 Reserved for concepts with insufficient information to code with codable children: Secondary | ICD-10-CM | POA: Insufficient documentation

## 2012-06-28 DIAGNOSIS — N5089 Other specified disorders of the male genital organs: Secondary | ICD-10-CM

## 2012-06-28 DIAGNOSIS — F3289 Other specified depressive episodes: Secondary | ICD-10-CM | POA: Insufficient documentation

## 2012-06-28 LAB — URINALYSIS, ROUTINE W REFLEX MICROSCOPIC
Bilirubin Urine: NEGATIVE
Hgb urine dipstick: NEGATIVE
Specific Gravity, Urine: 1.012 (ref 1.005–1.030)
Urobilinogen, UA: 0.2 mg/dL (ref 0.0–1.0)

## 2012-06-28 MED ORDER — OXYCODONE-ACETAMINOPHEN 5-325 MG PO TABS: 2.0000 | ORAL_TABLET | ORAL | Status: DC | PRN

## 2012-06-28 MED ORDER — CLOTRIMAZOLE 1 % EX CREA: TOPICAL_CREAM | CUTANEOUS | Status: DC

## 2012-06-28 MED ORDER — OXYCODONE-ACETAMINOPHEN 5-325 MG PO TABS
2.0000 | ORAL_TABLET | Freq: Once | ORAL | Status: AC
  Administered 2012-06-28: 2 via ORAL
  Filled 2012-06-28: qty 2

## 2012-06-28 NOTE — ED Notes (Signed)
Pt c/o abscess directly under testicles, pt reports it is painful to walk and wear tight pants. Pain 10/10.

## 2012-06-28 NOTE — ED Provider Notes (Signed)
History  Scribed for Glynn Octave, MD, the patient was seen in room TR11C/TR11C. This chart was scribed by Candelaria Stagers. The patient's care started at 5:22 PM   CSN: [id number]  Arrival date & time 06/28/12  1703   First MD Initiated Contact with Patient 06/28/12 1719      Chief Complaint  Patient presents with  . Abscess     The history is provided by the patient. No language interpreter was used.   Trevor Thomas is a 46 y.o. male who presents to the Emergency Department complaining of a painful bump under his testicle that has gotten worse over the last few days that he reports he noticed after scratching his groin area more than usual which he reports is caused by his work.  He denies fever, nausea, vomiting, testicle pain, or trouble urinating.   He states that jeans and pants make the pain worse.  Pt has had an abscess to the same area before that had to be lanced.  Pt was here last week for chest pain, but refused to stay in the hospital.  He did not want to discuss the chest pain today.  Past Medical History  Diagnosis Date  . Hypertension   . COPD (chronic obstructive pulmonary disease)   . Bronchitis     hx of  . Asthma   . Concussion 1983  . Headache   . Anxiety   . Depression   . Umbilical hernia   . GERD (gastroesophageal reflux disease)     takes tums/rolaids  . Arthritis     Past Surgical History  Procedure Date  . Knee surgery   . Thumb surgery (left)   . Eye surgery   . Umbilical hernia repair 05/15/2012    Procedure: HERNIA REPAIR UMBILICAL ADULT;  Surgeon: Clovis Pu. Cornett, MD;  Location: [facility] OR;  Service: General;  Laterality: N/A;  . Hernia repair 05/2012    umbilical hernia    History reviewed. No pertinent family history.  History  Substance Use Topics  . Smoking status: Current Every Day Smoker -- 1.0 packs/day for 20 years    Types: Cigarettes  . Smokeless tobacco: Never Used  . Alcohol Use: No     rarely  - quit approx. June 2013       Review of Systems A complete 10 system review of systems was obtained and all systems are negative except as noted in the HPI and PMH.   Allergies  Review of patient's allergies indicates no known allergies.  Home Medications   Current Outpatient Rx  Name Route Sig Dispense Refill  . CYCLOBENZAPRINE HCL 10 MG PO TABS Oral Take 10 mg by mouth 3 (three) times daily as needed. For muscle spasm.    . IBUPROFEN 200 MG PO TABS Oral Take 600 mg by mouth every 8 (eight) hours as needed. For pain.    Marland Kitchen LISINOPRIL 10 MG PO TABS Oral Take 10 mg by mouth daily.    Marland Kitchen LORATADINE 10 MG PO TABS Oral Take 10 mg by mouth daily.    . OXYCODONE-ACETAMINOPHEN 10-325 MG PO TABS Oral Take 1 tablet by mouth every 6 (six) hours as needed for pain. 30 tablet 0    BP 156/116  Pulse 107  Temp 98.1 F (36.7 C) (Oral)  Resp 18  SpO2 97%  Physical Exam  Nursing note and vitals reviewed. Constitutional: He is oriented to person, place, and time. He appears well-developed and well-nourished. No distress.  HENT:  Head:  Normocephalic and atraumatic.  Eyes: EOM are normal. Pupils are equal, round, and reactive to light.  Neck: Neck supple. No tracheal deviation present.  Cardiovascular: Normal rate.   Pulmonary/Chest: Effort normal. No respiratory distress.  Abdominal: Soft. He exhibits no distension.  Musculoskeletal: Normal range of motion. He exhibits no edema.       0.25 punctuate excoriation/ulceration to perineum.  No abscess no cellulitis. Testicles non tender.   Neurological: He is alert and oriented to person, place, and time. No sensory deficit.  Skin: Skin is warm and dry.  Psychiatric: He has a normal mood and affect. His behavior is normal.    ED Course  Procedures   DIAGNOSTIC STUDIES: Oxygen Saturation is 97% on room air, normal by my interpretation.    COORDINATION OF CARE:  17:46 Ordered: Urinalysis, Routine w reflex microscopic    Labs Reviewed  URINALYSIS, ROUTINE W  REFLEX MICROSCOPIC  RPR   No results found.   No diagnosis found.    MDM  Small area of excoriation/ulceration to perineum. No cellulitis, no abscess. No testicular tenderness. No other genital lesions. No vesicular lesions.  Suspect excoriation from concominant tinea cruris. No superimposed infection. No Fournier's gangrene. Consider herpetic lesion, but solitary and nonvesicular. RPR sent.  Patient was seen 10 days ago for chest pain and refused admission. I spoke with him about this today he states he's had no further episodes of chest pain and does not want to talk about it. He understands the risks of possible heart disease including heart attack and death.  I personally performed the services described in this documentation, which was scribed in my presence.  The recorded information has been reviewed and considered.        Glynn Octave, MD 06/28/12 2006

## 2012-06-28 NOTE — ED Notes (Addendum)
Pt reports noticing abscess to groin area yesterday; reports is small, but also having itching and rash to area; attempted to inspect area; was unable to locate abscess where pt was pointing--palpated, and did not feel firm

## 2012-07-02 ENCOUNTER — Encounter (HOSPITAL_COMMUNITY): Payer: Self-pay | Admitting: Family Medicine

## 2012-07-02 ENCOUNTER — Emergency Department (HOSPITAL_COMMUNITY): Payer: Self-pay

## 2012-07-02 ENCOUNTER — Emergency Department (HOSPITAL_COMMUNITY)
Admission: EM | Admit: 2012-07-02 | Discharge: 2012-07-02 | Disposition: A | Discharge status: Home / Self Care | Payer: Self-pay | Attending: Emergency Medicine | Admitting: Emergency Medicine

## 2012-07-02 DIAGNOSIS — K219 Gastro-esophageal reflux disease without esophagitis: Secondary | ICD-10-CM | POA: Insufficient documentation

## 2012-07-02 DIAGNOSIS — F329 Major depressive disorder, single episode, unspecified: Secondary | ICD-10-CM | POA: Insufficient documentation

## 2012-07-02 DIAGNOSIS — G8929 Other chronic pain: Secondary | ICD-10-CM | POA: Insufficient documentation

## 2012-07-02 DIAGNOSIS — M549 Dorsalgia, unspecified: Secondary | ICD-10-CM | POA: Insufficient documentation

## 2012-07-02 DIAGNOSIS — J4489 Other specified chronic obstructive pulmonary disease: Secondary | ICD-10-CM | POA: Insufficient documentation

## 2012-07-02 DIAGNOSIS — F17200 Nicotine dependence, unspecified, uncomplicated: Secondary | ICD-10-CM | POA: Insufficient documentation

## 2012-07-02 DIAGNOSIS — F411 Generalized anxiety disorder: Secondary | ICD-10-CM | POA: Insufficient documentation

## 2012-07-02 DIAGNOSIS — I10 Essential (primary) hypertension: Secondary | ICD-10-CM | POA: Insufficient documentation

## 2012-07-02 DIAGNOSIS — F3289 Other specified depressive episodes: Secondary | ICD-10-CM | POA: Insufficient documentation

## 2012-07-02 DIAGNOSIS — J449 Chronic obstructive pulmonary disease, unspecified: Secondary | ICD-10-CM | POA: Insufficient documentation

## 2012-07-02 LAB — COMPREHENSIVE METABOLIC PANEL
ALT: 37 U/L (ref 0–53)
AST: 25 U/L (ref 0–37)
Albumin: 3.9 g/dL (ref 3.5–5.2)
CO2: 26 mEq/L (ref 19–32)
Calcium: 9.9 mg/dL (ref 8.4–10.5)
Chloride: 103 mEq/L (ref 96–112)
GFR calc non Af Amer: >90 mL/min (ref >90)
Sodium: 140 mEq/L (ref 135–145)
Total Bilirubin: 0.3 mg/dL (ref 0.3–1.2)

## 2012-07-02 LAB — CBC WITH DIFFERENTIAL/PLATELET
Basophils Absolute: 0 10*3/uL (ref 0.0–0.1)
Basophils Relative: 0 % (ref 0–1)
Lymphocytes Relative: 37 % (ref 12–46)
Neutro Abs: 5 10*3/uL (ref 1.7–7.7)
Platelets: 245 10*3/uL (ref 150–400)
RDW: 12.4 % (ref 11.5–15.5)
WBC: 9 10*3/uL (ref 4.0–10.5)

## 2012-07-02 MED ORDER — OXYCODONE-ACETAMINOPHEN 5-325 MG PO TABS: 1.0000 | ORAL_TABLET | ORAL | Status: DC | PRN

## 2012-07-02 MED ORDER — AZITHROMYCIN 250 MG PO TABS: ORAL_TABLET | ORAL | Status: DC

## 2012-07-02 MED ORDER — LORAZEPAM 1 MG PO TABS: 1.0000 mg | ORAL_TABLET | Freq: Three times a day (TID) | ORAL | Status: DC | PRN

## 2012-07-02 NOTE — ED Provider Notes (Cosign Needed)
History     CSN: [id number]  Arrival date & time 07/02/12  1311   First MD Initiated Contact with Patient 07/02/12 1919      Chief Complaint  Patient presents with  . Chest Pain    (Consider location/radiation/quality/duration/timing/severity/associated sxs/prior treatment) HPI Comments: The patient is a 46 year old man who complains of chest pain, cough and shortness of breath, and of chronic back pain.  He is a former [facility] patient who has been unable to find a primary care physician.  He has been seen for chest pain on 06/19/2012 but refused admission then.  Subsequently he was seen for tinea cruris and was treated for that on 06/28/2012.  He continues to smoke.  Having lost his primary care provider at [facility], with the closing of that facility, has cause him great stress; despite efforts to find a primary care provider has has been able to find no one who will take him.  Patient is a 46 y.o. male presenting with chest pain. The history is provided by the patient, the spouse and medical records. No language interpreter was used.  Chest Pain The chest pain began more than 2 weeks ago. Chest pain occurs intermittently. The chest pain is unchanged. The pain is associated with breathing, coughing and stress. At its most intense, the pain is at 10/10. The pain is currently at 10/10. The severity of the pain is severe. The quality of the pain is described as similar to previous episodes and aching. The pain does not radiate. Chest pain is worsened by deep breathing, smoking and stress. Primary symptoms include shortness of breath and cough. Pertinent negatives for primary symptoms include no fever.  The shortness of breath began more than 2 days ago. The shortness of breath developed gradually. The shortness of breath is moderate. The patient's medical history is significant for COPD.  The cough began more than 1 week ago. The cough is recurrent. The cough is productive. The sputum is white.  It is exacerbated by smoking. He tried nothing for the symptoms. Risk factors include lack of exercise, male gender, obesity, stress and smoking/tobacco exposure.  His past medical history is significant for COPD. Past medical history comments: Chronic back pain     Past Medical History  Diagnosis Date  . Hypertension   . COPD (chronic obstructive pulmonary disease)   . Bronchitis     hx of  . Asthma   . Concussion 1983  . Headache   . Anxiety   . Depression   . Umbilical hernia   . GERD (gastroesophageal reflux disease)     takes tums/rolaids  . Arthritis     Past Surgical History  Procedure Date  . Knee surgery   . Thumb surgery (left)   . Eye surgery   . Umbilical hernia repair 05/15/2012    Procedure: HERNIA REPAIR UMBILICAL ADULT;  Surgeon: Clovis Pu. Cornett, MD;  Location: [facility] OR;  Service: General;  Laterality: N/A;  . Hernia repair 05/2012    umbilical hernia    History reviewed. No pertinent family history.  History  Substance Use Topics  . Smoking status: Current Every Day Smoker -- 1.0 packs/day for 20 years    Types: Cigarettes  . Smokeless tobacco: Never Used  . Alcohol Use: No     rarely  - quit approx. June 2013      Review of Systems  Constitutional: Negative.  Negative for fever and chills.  Eyes: Negative.   Respiratory: Positive for cough  and shortness of breath.   Cardiovascular: Positive for chest pain.  Gastrointestinal: Negative.   Genitourinary: Negative.   Musculoskeletal: Positive for back pain.  Skin: Negative.   Neurological: Positive for headaches.  Psychiatric/Behavioral: Positive for agitation. The patient is nervous/anxious.     Allergies  Review of patient's allergies indicates no known allergies.  Home Medications   Current Outpatient Rx  Name Route Sig Dispense Refill  . CYCLOBENZAPRINE HCL 10 MG PO TABS Oral Take 10 mg by mouth 3 (three) times daily as needed. For muscle spasm.    . IBUPROFEN 200 MG PO TABS Oral Take  600 mg by mouth every 8 (eight) hours as needed. For pain.    Marland Kitchen LISINOPRIL 10 MG PO TABS Oral Take 10 mg by mouth daily.    Marland Kitchen LORATADINE 10 MG PO TABS Oral Take 10 mg by mouth daily.    . OXYCODONE-ACETAMINOPHEN 10-325 MG PO TABS Oral Take 1 tablet by mouth every 6 (six) hours as needed for pain. 30 tablet 0  . AZITHROMYCIN 250 MG PO TABS  Take two tablets today, then one tablet per day for the next 4 days. 6 tablet 0  . LORAZEPAM 1 MG PO TABS Oral Take 1 tablet (1 mg total) by mouth 3 (three) times daily as needed for anxiety. 21 tablet 0  . OXYCODONE-ACETAMINOPHEN 5-325 MG PO TABS Oral Take 1 tablet by mouth every 4 (four) hours as needed for pain. 60 tablet 0    BP 141/95  Pulse 90  Temp 98.3 F (36.8 C) (Oral)  Resp 22  SpO2 97%  Physical Exam  Nursing note and vitals reviewed. Constitutional: He is oriented to person, place, and time. He appears well-developed and well-nourished.       Anxious, agitated.  HENT:  Head: Normocephalic and atraumatic.  Right Ear: External ear normal.  Left Ear: External ear normal.  Mouth/Throat: Oropharynx is clear and moist.  Eyes: Conjunctivae normal and EOM are normal. Pupils are equal, round, and reactive to light.  Neck: Normal range of motion. Neck supple.  Cardiovascular: Normal rate, regular rhythm and normal heart sounds.   Pulmonary/Chest:       Bibasilar rhonchi.  Abdominal: Soft. Bowel sounds are normal.  Musculoskeletal: Normal range of motion. He exhibits no edema and no tenderness.  Neurological: He is alert and oriented to person, place, and time.       No sensory or motor deficit.  Skin: Skin is warm and dry.  Psychiatric:       Anxious, agitated.    ED Course  Procedures (including critical care time)   Date: 07/02/2012  Rate: 110  Rhythm: sinus tachycardia  QRS Axis: normal  Intervals: normal  ST/T Wave abnormalities: normal  Conduction Disutrbances:none  Narrative Interpretation: Sinus tachycardia, otherwise  normal.  Old EKG Reviewed: unchanged     Labs Reviewed  COMPREHENSIVE METABOLIC PANEL - Abnormal; Notable for the following:    Glucose, Bld 113 (*)     All other components within normal limits  CBC WITH DIFFERENTIAL  POCT I-STAT TROPONIN I   Dg Chest 2 View  07/02/2012  *RADIOLOGY REPORT*  Clinical Data: Chest pain.  CHEST - 2 VIEW  Comparison: June 19, 2012.  Findings: Cardiomediastinal silhouette appears normal.  No acute pulmonary disease is noted.  Bony thorax is intact.  IMPRESSION: No acute cardiopulmonary abnormality seen.   Original Report Authenticated By: Venita Sheffield., M.D.    Pt's chest pain is likely the result of  anxiety and stress.  Rx Ativan for stress, Z-Pak for his COPD/bronchitis, and one month's Rx of Percocet for his chronic pain.  1. Anxiety reaction   2. COPD (chronic obstructive pulmonary disease)   3. Chronic back pain          Carleene Cooper III, MD 07/03/12 [phone]

## 2012-07-02 NOTE — ED Notes (Signed)
Patient transported to X-ray 

## 2012-07-02 NOTE — ED Notes (Signed)
Pt sts chest pain, SOB, achy all over and irritable. sts mid sternal chest pain radiating into both arm. sts fatigue

## 2012-07-03 ENCOUNTER — Encounter (INDEPENDENT_AMBULATORY_CARE_PROVIDER_SITE_OTHER): Payer: Self-pay | Admitting: Surgery

## 2012-07-11 ENCOUNTER — Institutional Professional Consult (permissible substitution): Payer: Self-pay | Admitting: Family Medicine

## 2012-07-23 ENCOUNTER — Encounter (HOSPITAL_COMMUNITY): Payer: Self-pay | Admitting: Adult Health

## 2012-07-23 ENCOUNTER — Emergency Department (HOSPITAL_COMMUNITY): Payer: Self-pay

## 2012-07-23 ENCOUNTER — Telehealth (HOSPITAL_COMMUNITY): Payer: Self-pay | Admitting: *Deleted

## 2012-07-23 ENCOUNTER — Emergency Department (HOSPITAL_COMMUNITY)
Admission: EM | Admit: 2012-07-23 | Discharge: 2012-07-23 | Disposition: A | Discharge status: Home / Self Care | Payer: Self-pay | Attending: Emergency Medicine | Admitting: Emergency Medicine

## 2012-07-23 DIAGNOSIS — J45909 Unspecified asthma, uncomplicated: Secondary | ICD-10-CM | POA: Insufficient documentation

## 2012-07-23 DIAGNOSIS — G8929 Other chronic pain: Secondary | ICD-10-CM

## 2012-07-23 DIAGNOSIS — J4489 Other specified chronic obstructive pulmonary disease: Secondary | ICD-10-CM | POA: Insufficient documentation

## 2012-07-23 DIAGNOSIS — F419 Anxiety disorder, unspecified: Secondary | ICD-10-CM

## 2012-07-23 DIAGNOSIS — F17200 Nicotine dependence, unspecified, uncomplicated: Secondary | ICD-10-CM | POA: Insufficient documentation

## 2012-07-23 DIAGNOSIS — I10 Essential (primary) hypertension: Secondary | ICD-10-CM | POA: Insufficient documentation

## 2012-07-23 DIAGNOSIS — Z79899 Other long term (current) drug therapy: Secondary | ICD-10-CM | POA: Insufficient documentation

## 2012-07-23 DIAGNOSIS — J45901 Unspecified asthma with (acute) exacerbation: Secondary | ICD-10-CM

## 2012-07-23 DIAGNOSIS — J449 Chronic obstructive pulmonary disease, unspecified: Secondary | ICD-10-CM | POA: Insufficient documentation

## 2012-07-23 DIAGNOSIS — Z8659 Personal history of other mental and behavioral disorders: Secondary | ICD-10-CM | POA: Insufficient documentation

## 2012-07-23 LAB — BASIC METABOLIC PANEL
BUN: 11 mg/dL (ref 6–23)
Chloride: 103 mEq/L (ref 96–112)
Creatinine, Ser: 0.72 mg/dL (ref 0.50–1.35)
GFR calc Af Amer: >90 mL/min (ref >90)
Glucose, Bld: 107 mg/dL — ABNORMAL HIGH (ref 70–99)
Potassium: 4.1 mEq/L (ref 3.5–5.1)

## 2012-07-23 LAB — CBC
HCT: 48.2 % (ref 39.0–52.0)
Hemoglobin: 17.2 g/dL — ABNORMAL HIGH (ref 13.0–17.0)
MCV: 88.9 fL (ref 78.0–100.0)
RDW: 12.5 % (ref 11.5–15.5)
WBC: 9.9 10*3/uL (ref 4.0–10.5)

## 2012-07-23 LAB — RAPID URINE DRUG SCREEN, HOSP PERFORMED
Amphetamines: NOT DETECTED
Barbiturates: NOT DETECTED
Benzodiazepines: NOT DETECTED
Cocaine: NOT DETECTED
Opiates: NOT DETECTED
Tetrahydrocannabinol: POSITIVE — AB

## 2012-07-23 MED ORDER — LORATADINE 10 MG PO TABS: 10.0000 mg | ORAL_TABLET | Freq: Every day | ORAL | Status: DC

## 2012-07-23 MED ORDER — ASPIRIN 81 MG PO CHEW
324.0000 mg | CHEWABLE_TABLET | Freq: Once | ORAL | Status: AC
  Administered 2012-07-23: 324 mg via ORAL
  Filled 2012-07-23: qty 4

## 2012-07-23 MED ORDER — IPRATROPIUM BROMIDE 0.02 % IN SOLN
0.5000 mg | Freq: Once | RESPIRATORY_TRACT | Status: AC
  Administered 2012-07-23: 0.5 mg via RESPIRATORY_TRACT
  Filled 2012-07-23: qty 2.5

## 2012-07-23 MED ORDER — PREDNISONE 20 MG PO TABS
60.0000 mg | ORAL_TABLET | Freq: Once | ORAL | Status: AC
  Administered 2012-07-23: 60 mg via ORAL
  Filled 2012-07-23: qty 3

## 2012-07-23 MED ORDER — LORAZEPAM 1 MG PO TABS
1.0000 mg | ORAL_TABLET | Freq: Once | ORAL | Status: AC
  Administered 2012-07-23: 1 mg via ORAL
  Filled 2012-07-23: qty 1

## 2012-07-23 MED ORDER — LISINOPRIL 20 MG PO TABS: 10.0000 mg | ORAL_TABLET | Freq: Every day | ORAL | Status: DC

## 2012-07-23 MED ORDER — OXYCODONE-ACETAMINOPHEN 5-325 MG PO TABS: 1.0000 | ORAL_TABLET | Freq: Four times a day (QID) | ORAL | Status: DC | PRN

## 2012-07-23 MED ORDER — OXYCODONE-ACETAMINOPHEN 5-325 MG PO TABS
2.0000 | ORAL_TABLET | Freq: Once | ORAL | Status: AC
  Administered 2012-07-23: 2 via ORAL
  Filled 2012-07-23: qty 2

## 2012-07-23 MED ORDER — ALBUTEROL SULFATE HFA 108 (90 BASE) MCG/ACT IN AERS: 2.0000 | INHALATION_SPRAY | RESPIRATORY_TRACT | Status: DC | PRN

## 2012-07-23 MED ORDER — ALBUTEROL SULFATE (5 MG/ML) 0.5% IN NEBU
5.0000 mg | INHALATION_SOLUTION | Freq: Once | RESPIRATORY_TRACT | Status: AC
  Administered 2012-07-23: 5 mg via RESPIRATORY_TRACT
  Filled 2012-07-23: qty 1

## 2012-07-23 NOTE — ED Notes (Signed)
Patient was informed that he needs to follow up with one of the providers listed on his orange card per Dr Lynelle Doctor office because they do not accept that insurance.

## 2012-07-23 NOTE — ED Provider Notes (Signed)
History     CSN: [id number]  Arrival date & time 07/23/12  1728   First MD Initiated Contact with Patient 07/23/12 1904      Chief Complaint  Patient presents with  . Chest Pain    (Consider location/radiation/quality/duration/timing/severity/associated sxs/prior treatment) HPI  Patient to the ER with complaints of chest pain, back pain, leg pain, asthma exacerbation, medication refill and severe anxiety. He has been seen here before for the same a few weeks ago and had his medications refilled. He says that he took his last Percocet pill today and needs a refill on that, flexeril, lisinopril, claritin and ativan. 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 He says that he has been crying a lot because he feels bad for his wife who is working because he keeps her up and night tossing and turning. vss nad  Past Medical History  Diagnosis Date  . Hypertension   . COPD (chronic obstructive pulmonary disease)   . Bronchitis     hx of  . Asthma   . Concussion 1983  . Headache   . Anxiety   . Depression   . Umbilical hernia   . GERD (gastroesophageal reflux disease)     takes tums/rolaids  . Arthritis     Past Surgical History  Procedure Date  . Knee surgery   . Thumb surgery (left)   . Eye surgery   . Umbilical hernia repair 05/15/2012    Procedure: HERNIA REPAIR UMBILICAL ADULT;  Surgeon: Clovis Pu. Cornett, MD;  Location: [facility] OR;  Service: General;  Laterality: N/A;  . Hernia repair 05/2012    umbilical hernia    History reviewed. No pertinent family history.  History  Substance Use Topics  . Smoking status: Current Every Day Smoker -- 1.0 packs/day for 20 years    Types: Cigarettes  . Smokeless tobacco: Never Used  . Alcohol Use: No     rarely  - quit approx. June 2013      Review of Systems  Review of Systems  Gen: no weight loss, fevers, chills, night sweats  Eyes: no discharge or drainage, no  occular pain or visual changes  Nose: no epistaxis or rhinorrhea  Mouth: no dental pain, no sore throat  Neck: no neck pain  Lungs:subjective wheezing, no coughing or hemoptysis CV: no chest pain, palpitations, dependent edema or orthopnea  Abd: no abdominal pain, nausea, vomiting  GU: no dysuria or gross hematuria  MSK: diffuse myalgias (baseline) Neuro: no headache, no focal neurologic deficits  Skin: no abnormalities Psyche: anxiety   Allergies  Review of patient's allergies indicates no known allergies.  Home Medications   Current Outpatient Rx  Name Route Sig Dispense Refill  . CYCLOBENZAPRINE HCL 10 MG PO TABS Oral Take 10 mg by mouth 3 (three) times daily as needed. For muscle spasm.    . IBUPROFEN 200 MG PO TABS Oral Take 600 mg by mouth every 8 (eight) hours as needed. For pain.    Marland Kitchen LISINOPRIL 10 MG PO TABS Oral Take 10 mg by mouth daily.    Marland Kitchen LORAZEPAM 1 MG PO TABS Oral Take 1 tablet (1 mg total) by mouth 3 (three) times daily as needed for anxiety. 21 tablet 0  . OXYCODONE-ACETAMINOPHEN 10-325 MG PO TABS Oral Take 1 tablet by mouth every 6 (six) hours as needed for pain. 30 tablet 0  . LISINOPRIL 20 MG PO TABS  Oral Take 0.5 tablets (10 mg total) by mouth daily. 30 tablet 1  . LORATADINE 10 MG PO TABS Oral Take 1 tablet (10 mg total) by mouth daily. 30 tablet 0  . OXYCODONE-ACETAMINOPHEN 5-325 MG PO TABS Oral Take 1 tablet by mouth every 6 (six) hours as needed for pain. 20 tablet 0    BP 139/84  Pulse 98  Temp 98.4 F (36.9 C) (Oral)  Resp 18  SpO2 97%  Physical Exam  Nursing note and vitals reviewed. Constitutional: He appears well-developed and well-nourished. No distress.  HENT:  Head: Normocephalic and atraumatic.  Eyes: Pupils are equal, round, and reactive to light.  Neck: Normal range of motion. Neck supple.  Cardiovascular: Normal rate and regular rhythm.   Pulmonary/Chest: Effort normal.  Abdominal: Soft.  Neurological: He is alert.  Skin: Skin is  warm and dry.    ED Course  Procedures (including critical care time)  Labs Reviewed  CBC - Abnormal; Notable for the following:    Hemoglobin 17.2 (*)     All other components within normal limits  BASIC METABOLIC PANEL - Abnormal; Notable for the following:    Glucose, Bld 107 (*)     All other components within normal limits  URINE RAPID DRUG SCREEN (HOSP PERFORMED) - Abnormal; Notable for the following:    Tetrahydrocannabinol POSITIVE (*)     All other components within normal limits  POCT I-STAT TROPONIN I  TROPONIN I   Dg Chest 2 View  07/23/2012  *RADIOLOGY REPORT*  Clinical Data: Chest pain.  CHEST - 2 VIEW  Comparison: 07/02/2012  Findings: Two views of the chest were obtained.  Stable appearance of the heart and mediastinum.  No evidence for airspace disease or pulmonary edema.  Trachea is midline.  Slightly prominent central vascular structures are unchanged.  IMPRESSION: Stable chest radiograph findings.  No acute cardiopulmonary disease.   Original Report Authenticated By: Richarda Overlie, M.D.      1. Asthma exacerbation   2. Anxiety   3. Chronic pain       MDM   Date: 07/23/2012  Rate: 102  Rhythm: normal sinus rhythm  QRS Axis: normal  Intervals: normal  ST/T Wave abnormalities: normal  Conduction Disutrbances:none  Narrative Interpretation:   Old EKG Reviewed: unchanged   Pt has chronic pain issues, he comes in with severe anxiety and asthma exacerbation.  He was given 60mg  Prednisone in ED and an inhaler for home. Pain treated with percocet. Rx for medication refills given of his BP, pain and allergy medication. Multiple family practice referrals given.  I have discussed this case with Dr. Denton Lank who is agreeable with plan.  Pt has been advised of the symptoms that warrant their return to the ED. Patient has voiced understanding and has agreed to follow-up with the PCP or specialist.        Dorthula Matas, PA 07/23/12 2215  Dorthula Matas,  PA 07/23/12 2226

## 2012-07-23 NOTE — ED Notes (Signed)
The patient is AOx4 and comfortable with the discharge instructions. 

## 2012-07-23 NOTE — ED Notes (Signed)
Pt reports 3 nights of chest pain and generalized body aches. Chest pain is located on left side and associated with SOB , nausea,  diaphoresis. Pt states I feel like I can't catch my breath and my medicines are all out of whack. "They have wanted to keep me before for this but I left, this time I will stay". Pt is alert and oriented.

## 2012-07-23 NOTE — ED Provider Notes (Signed)
Medical screening examination/treatment/procedure(s) were conducted as a shared visit with non-physician practitioner(s) and myself.  I personally evaluated the patient during the encounter Pt request refill med, pain for couple days, constant. Alert, nad. Chest cta.   Suzi Roots, MD 07/23/12 [phone]

## 2012-07-24 ENCOUNTER — Institutional Professional Consult (permissible substitution): Payer: Self-pay | Admitting: Medical

## 2012-08-06 ENCOUNTER — Encounter (HOSPITAL_COMMUNITY): Payer: Self-pay

## 2012-08-06 ENCOUNTER — Emergency Department (HOSPITAL_COMMUNITY)
Admission: EM | Admit: 2012-08-06 | Discharge: 2012-08-06 | Disposition: A | Discharge status: Home / Self Care | Payer: Self-pay | Attending: Emergency Medicine | Admitting: Emergency Medicine

## 2012-08-06 DIAGNOSIS — K219 Gastro-esophageal reflux disease without esophagitis: Secondary | ICD-10-CM | POA: Insufficient documentation

## 2012-08-06 DIAGNOSIS — F3289 Other specified depressive episodes: Secondary | ICD-10-CM | POA: Insufficient documentation

## 2012-08-06 DIAGNOSIS — M549 Dorsalgia, unspecified: Secondary | ICD-10-CM | POA: Insufficient documentation

## 2012-08-06 DIAGNOSIS — J449 Chronic obstructive pulmonary disease, unspecified: Secondary | ICD-10-CM | POA: Insufficient documentation

## 2012-08-06 DIAGNOSIS — F329 Major depressive disorder, single episode, unspecified: Secondary | ICD-10-CM | POA: Insufficient documentation

## 2012-08-06 DIAGNOSIS — M25569 Pain in unspecified knee: Secondary | ICD-10-CM

## 2012-08-06 DIAGNOSIS — I10 Essential (primary) hypertension: Secondary | ICD-10-CM | POA: Insufficient documentation

## 2012-08-06 DIAGNOSIS — Z79899 Other long term (current) drug therapy: Secondary | ICD-10-CM | POA: Insufficient documentation

## 2012-08-06 DIAGNOSIS — F17200 Nicotine dependence, unspecified, uncomplicated: Secondary | ICD-10-CM | POA: Insufficient documentation

## 2012-08-06 DIAGNOSIS — M129 Arthropathy, unspecified: Secondary | ICD-10-CM | POA: Insufficient documentation

## 2012-08-06 DIAGNOSIS — Z87828 Personal history of other (healed) physical injury and trauma: Secondary | ICD-10-CM | POA: Insufficient documentation

## 2012-08-06 DIAGNOSIS — J4489 Other specified chronic obstructive pulmonary disease: Secondary | ICD-10-CM | POA: Insufficient documentation

## 2012-08-06 DIAGNOSIS — F411 Generalized anxiety disorder: Secondary | ICD-10-CM | POA: Insufficient documentation

## 2012-08-06 MED ORDER — MELOXICAM 15 MG PO TABS: 15.0000 mg | ORAL_TABLET | Freq: Every day | ORAL | Status: DC

## 2012-08-06 MED ORDER — OXYCODONE-ACETAMINOPHEN 5-325 MG PO TABS: 2.0000 | ORAL_TABLET | ORAL | Status: DC | PRN

## 2012-08-06 NOTE — ED Provider Notes (Signed)
Medical screening examination/treatment/procedure(s) were performed by non-physician practitioner and as supervising physician I was immediately available for consultation/collaboration.   Gwyneth Sprout, MD 08/06/12 [phone]

## 2012-08-06 NOTE — ED Notes (Signed)
Pt sitting still and knees have loud popping noise with no activity. Pain severe. Hx left knee surgery 10 years ago. Pt stays awake at night due to pain. Pt in tears while explaining symptoms. Pt nearly falls and leg gives out if walking while knee pops. Pt has orange card and has been unable to find a PCP.

## 2012-08-06 NOTE — ED Notes (Signed)
Patient c/o bilateral knee pain and low back pain.

## 2012-08-06 NOTE — ED Provider Notes (Signed)
History     CSN: [id number]  Arrival date & time 08/06/12  1136   First MD Initiated Contact with Patient 08/06/12 1518      Chief Complaint  Patient presents with  . Knee Pain    (Consider location/radiation/quality/duration/timing/severity/associated sxs/prior treatment) Patient is a 46 y.o. male presenting with knee pain. The history is provided by the patient. No language interpreter was used.  Knee Pain This is a chronic problem. The current episode started more than 1 year ago. The problem occurs daily. The problem has been gradually worsening. Associated symptoms include arthralgias. Pertinent negatives include no fever, joint swelling, nausea, numbness, vomiting or weakness. The symptoms are aggravated by walking.  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 No injury reported.  MRi in January shows joint effusion on the L.  States he has had laser surgery to his L knee as well but does not remember who did it.  In the process of getting pcp at [facility].  States that his R knee is starting to feel the same way.  States he has been taking 4 aleve a day for the pain with mild improvement.  States that pain improves the longer he uses the knee or walks but his COPD can not tolerate walking a lot.  pmh listed below.    Past Medical History  Diagnosis Date  . Hypertension   . COPD (chronic obstructive pulmonary disease)   . Bronchitis     hx of  . Asthma   . Concussion 1983  . Headache   . Anxiety   . Depression   . Umbilical hernia   . GERD (gastroesophageal reflux disease)     takes tums/rolaids  . Arthritis   . Head trauma     Past Surgical History  Procedure Date  . Knee surgery   . Thumb surgery (left)   . Eye surgery   . Umbilical hernia repair 05/15/2012    Procedure: HERNIA REPAIR UMBILICAL ADULT;  Surgeon: Clovis Pu. Cornett, MD;  Location: [facility] OR;  Service: General;  Laterality: N/A;  . Hernia repair 05/2012     umbilical hernia    Family History  Problem Relation Age of Onset  . Diabetes Mother   . Diabetes Father   . Heart failure Brother     History  Substance Use Topics  . Smoking status: Current Every Day Smoker -- 1.0 packs/day for 20 years    Types: Cigarettes  . Smokeless tobacco: Never Used  . Alcohol Use: No     Comment: rarely  - quit approx. June 2013      Review of Systems  Constitutional: Negative.  Negative for fever.  HENT: Negative.   Eyes: Negative.   Respiratory: Negative.   Cardiovascular: Negative.   Gastrointestinal: Negative.  Negative for nausea and vomiting.  Musculoskeletal: Positive for back pain and arthralgias. Negative for joint swelling.  Neurological: Negative.  Negative for weakness and numbness.  Psychiatric/Behavioral: Negative.   All other systems reviewed and are negative.    Allergies  Review of patient's allergies indicates no known allergies.  Home Medications   Current Outpatient Rx  Name  Route  Sig  Dispense  Refill  . CYCLOBENZAPRINE HCL 10 MG PO TABS   Oral   Take 10 mg by mouth 3 (three) times daily as needed. For muscle spasm.         . IBUPROFEN 200 MG PO TABS   Oral  Take 600 mg by mouth every 8 (eight) hours as needed. For pain.         Marland Kitchen LISINOPRIL 20 MG PO TABS   Oral   Take 10-20 mg by mouth daily. Pt takes 10 to 20 mg daily as needed for blood pressure         . LORATADINE 10 MG PO TABS   Oral   Take 1 tablet (10 mg total) by mouth daily.   30 tablet   0   . OXYCODONE-ACETAMINOPHEN 5-325 MG PO TABS   Oral   Take 1 tablet by mouth every 6 (six) hours as needed for pain.   20 tablet   0   . MELOXICAM 15 MG PO TABS   Oral   Take 1 tablet (15 mg total) by mouth daily.   12 tablet   0   . OXYCODONE-ACETAMINOPHEN 5-325 MG PO TABS   Oral   Take 2 tablets by mouth every 4 (four) hours as needed for pain.   15 tablet   0     BP 160/112  Pulse 96  Temp 98.5 F (36.9 C) (Oral)  Resp 18   SpO2 98%  Physical Exam  Nursing note and vitals reviewed. Constitutional: He is oriented to person, place, and time. He appears well-developed and well-nourished.  HENT:  Head: Normocephalic.  Eyes: Conjunctivae normal and EOM are normal. Pupils are equal, round, and reactive to light.  Neck: Normal range of motion. Neck supple.  Cardiovascular: Normal rate.   Pulmonary/Chest: Effort normal.  Abdominal: Soft.  Musculoskeletal: Normal range of motion. He exhibits tenderness.       L knee > R knee tenderness.  Cool to touch.  + CMS below the pain no deformity noted  Neurological: He is alert and oriented to person, place, and time.  Skin: Skin is warm and dry.  Psychiatric: He has a normal mood and affect.    ED Course  Procedures (including critical care time)  Labs Reviewed - No data to display No results found.   1. Knee pain       MDM  46yo with chronic knee pain out of meds.  Will follow up at [facility] as soon as appointment obtained.  Has orange card.  Will follow up with ortho when he can afford.  A few percocet and mobic for pain.  Ice and elevate.  Stop aleve while taking mobic.  Return if severe pain, swelling or hot to touch.         Remi Haggard, NP 08/06/12 1601

## 2012-08-10 ENCOUNTER — Other Ambulatory Visit: Payer: Self-pay | Admitting: Family Medicine

## 2012-08-10 DIAGNOSIS — M79605 Pain in left leg: Secondary | ICD-10-CM

## 2012-08-10 DIAGNOSIS — M545 Low back pain: Secondary | ICD-10-CM

## 2012-08-13 ENCOUNTER — Emergency Department (HOSPITAL_COMMUNITY): Payer: Self-pay

## 2012-08-13 ENCOUNTER — Encounter (HOSPITAL_COMMUNITY): Payer: Self-pay | Admitting: Emergency Medicine

## 2012-08-13 ENCOUNTER — Emergency Department (HOSPITAL_COMMUNITY)
Admission: EM | Admit: 2012-08-13 | Discharge: 2012-08-13 | Disposition: A | Discharge status: Home / Self Care | Payer: Self-pay | Attending: Emergency Medicine | Admitting: Emergency Medicine

## 2012-08-13 DIAGNOSIS — R062 Wheezing: Secondary | ICD-10-CM | POA: Insufficient documentation

## 2012-08-13 DIAGNOSIS — M549 Dorsalgia, unspecified: Secondary | ICD-10-CM | POA: Insufficient documentation

## 2012-08-13 DIAGNOSIS — R6883 Chills (without fever): Secondary | ICD-10-CM | POA: Insufficient documentation

## 2012-08-13 DIAGNOSIS — J449 Chronic obstructive pulmonary disease, unspecified: Secondary | ICD-10-CM | POA: Insufficient documentation

## 2012-08-13 DIAGNOSIS — J40 Bronchitis, not specified as acute or chronic: Secondary | ICD-10-CM | POA: Insufficient documentation

## 2012-08-13 DIAGNOSIS — Z87828 Personal history of other (healed) physical injury and trauma: Secondary | ICD-10-CM | POA: Insufficient documentation

## 2012-08-13 DIAGNOSIS — R05 Cough: Secondary | ICD-10-CM | POA: Insufficient documentation

## 2012-08-13 DIAGNOSIS — R059 Cough, unspecified: Secondary | ICD-10-CM | POA: Insufficient documentation

## 2012-08-13 DIAGNOSIS — J329 Chronic sinusitis, unspecified: Secondary | ICD-10-CM | POA: Insufficient documentation

## 2012-08-13 DIAGNOSIS — M129 Arthropathy, unspecified: Secondary | ICD-10-CM | POA: Insufficient documentation

## 2012-08-13 DIAGNOSIS — Z8659 Personal history of other mental and behavioral disorders: Secondary | ICD-10-CM | POA: Insufficient documentation

## 2012-08-13 DIAGNOSIS — G8929 Other chronic pain: Secondary | ICD-10-CM | POA: Insufficient documentation

## 2012-08-13 DIAGNOSIS — R0602 Shortness of breath: Secondary | ICD-10-CM | POA: Insufficient documentation

## 2012-08-13 DIAGNOSIS — F17200 Nicotine dependence, unspecified, uncomplicated: Secondary | ICD-10-CM | POA: Insufficient documentation

## 2012-08-13 DIAGNOSIS — J4489 Other specified chronic obstructive pulmonary disease: Secondary | ICD-10-CM | POA: Insufficient documentation

## 2012-08-13 DIAGNOSIS — J3489 Other specified disorders of nose and nasal sinuses: Secondary | ICD-10-CM | POA: Insufficient documentation

## 2012-08-13 DIAGNOSIS — K219 Gastro-esophageal reflux disease without esophagitis: Secondary | ICD-10-CM | POA: Insufficient documentation

## 2012-08-13 DIAGNOSIS — Z8719 Personal history of other diseases of the digestive system: Secondary | ICD-10-CM | POA: Insufficient documentation

## 2012-08-13 MED ORDER — OXYMETAZOLINE HCL 0.05 % NA SOLN
1.0000 | Freq: Once | NASAL | Status: AC
  Administered 2012-08-13: 1 via NASAL
  Filled 2012-08-13: qty 15

## 2012-08-13 MED ORDER — AMOXICILLIN 500 MG PO CAPS: 500.0000 mg | ORAL_CAPSULE | Freq: Three times a day (TID) | ORAL | Status: DC

## 2012-08-13 MED ORDER — BENZONATATE 100 MG PO CAPS: 100.0000 mg | ORAL_CAPSULE | Freq: Three times a day (TID) | ORAL | Status: DC

## 2012-08-13 MED ORDER — ALBUTEROL SULFATE HFA 108 (90 BASE) MCG/ACT IN AERS
2.0000 | INHALATION_SPRAY | Freq: Once | RESPIRATORY_TRACT | Status: AC
  Administered 2012-08-13: 2 via RESPIRATORY_TRACT
  Filled 2012-08-13: qty 6.7

## 2012-08-13 NOTE — ED Provider Notes (Signed)
Medical screening examination/treatment/procedure(s) were performed by non-physician practitioner and as supervising physician I was immediately available for consultation/collaboration.  Shelda Jakes, MD 08/13/12 [phone]

## 2012-08-13 NOTE — ED Notes (Signed)
Pt transported back from radiology  

## 2012-08-13 NOTE — ED Notes (Signed)
Pt transported to radiology.

## 2012-08-13 NOTE — ED Notes (Addendum)
Per patient he states, "I think I have pneumonia, I have a headache from being congested and have not been able to sleep in days". Pt states he is having chronic back and knee pain that he has not been able to find a doctor to treat him for.

## 2012-08-13 NOTE — ED Provider Notes (Signed)
History     CSN: [id number]  Arrival date & time 08/13/12  0718   First MD Initiated Contact with Patient 08/13/12 0730      Chief Complaint  Patient presents with  . Headache  . Back Pain    (Consider location/radiation/quality/duration/timing/severity/associated sxs/prior treatment) HPI Comments: Trevor Thomas is a 46 y.o. Male who presents with complaint of nasal congestion, heaedache, cough for about a week, worsening in the last several days. States unable to sleep. Took alkazeltzer and robitussin with no relief. States nothing makes his symptoms better or worse. Denies fever. States also having pain in the left knee. States saw an orthopedist just few days ago, has MRI scheduled for tomorrow morning. Denies new injuries.     The history is provided by the patient.    Past Medical History  Diagnosis Date  . Hypertension   . COPD (chronic obstructive pulmonary disease)   . Bronchitis     hx of  . Asthma   . Concussion 1983  . Headache   . Anxiety   . Depression   . Umbilical hernia   . GERD (gastroesophageal reflux disease)     takes tums/rolaids  . Arthritis   . Head trauma     Past Surgical History  Procedure Date  . Knee surgery   . Thumb surgery (left)   . Eye surgery   . Umbilical hernia repair 05/15/2012    Procedure: HERNIA REPAIR UMBILICAL ADULT;  Surgeon: Clovis Pu. Cornett, MD;  Location: [facility] OR;  Service: General;  Laterality: N/A;  . Hernia repair 05/2012    umbilical hernia    Family History  Problem Relation Age of Onset  . Diabetes Mother   . Diabetes Father   . Heart failure Brother     History  Substance Use Topics  . Smoking status: Current Every Day Smoker -- 1.0 packs/day for 20 years    Types: Cigarettes  . Smokeless tobacco: Never Used  . Alcohol Use: No     Comment: rarely  - quit approx. June 2013      Review of Systems  Constitutional: Positive for chills. Negative for fever.  HENT: Positive for congestion, rhinorrhea  and sinus pressure. Negative for ear pain, sore throat, neck pain and neck stiffness.   Respiratory: Positive for cough, shortness of breath and wheezing. Negative for chest tightness.   Cardiovascular: Negative.   Gastrointestinal: Negative for nausea, vomiting, abdominal pain and diarrhea.  Musculoskeletal: Positive for back pain.  Neurological: Positive for headaches.    Allergies  Review of patient's allergies indicates no known allergies.  Home Medications   Current Outpatient Rx  Name  Route  Sig  Dispense  Refill  . CYCLOBENZAPRINE HCL 10 MG PO TABS   Oral   Take 10 mg by mouth 3 (three) times daily as needed. For muscle spasm.         . IBUPROFEN 200 MG PO TABS   Oral   Take 600 mg by mouth every 8 (eight) hours as needed. For pain.         Marland Kitchen LISINOPRIL 20 MG PO TABS   Oral   Take 20 mg by mouth daily.          Marland Kitchen LORATADINE 10 MG PO TABS   Oral   Take 1 tablet (10 mg total) by mouth daily.   30 tablet   0   . MELOXICAM 15 MG PO TABS   Oral   Take 1 tablet (15 mg  total) by mouth daily.   12 tablet   0   . OXYCODONE-ACETAMINOPHEN 5-325 MG PO TABS   Oral   Take 2 tablets by mouth every 4 (four) hours as needed.           BP 183/117  Pulse 101  Temp 97.3 F (36.3 C) (Oral)  Resp 16  SpO2 97%  Physical Exam  Nursing note and vitals reviewed. Constitutional: He is oriented to person, place, and time. He appears well-developed and well-nourished. No distress.  HENT:  Head: Normocephalic and atraumatic.  Right Ear: Tympanic membrane, external ear and ear canal normal.  Left Ear: Tympanic membrane, external ear and ear canal normal.  Nose: Rhinorrhea present. Right sinus exhibits maxillary sinus tenderness and frontal sinus tenderness. Left sinus exhibits maxillary sinus tenderness and frontal sinus tenderness.  Mouth/Throat: Uvula is midline, oropharynx is clear and moist and mucous membranes are normal.  Cardiovascular: Normal rate, regular rhythm  and normal heart sounds.   Pulmonary/Chest: Effort normal. No respiratory distress. He has wheezes. He has no rales.       Expiratory wheezes in all lung fields  Abdominal: Soft. Bowel sounds are normal. He exhibits no distension. There is no tenderness. There is no rebound.  Musculoskeletal: Normal range of motion. He exhibits no edema.  Neurological: He is alert and oriented to person, place, and time.  Skin: Skin is warm and dry.    ED Course  Procedures (including critical care time)  Labs Reviewed - No data to display Dg Chest 2 View  08/13/2012  *RADIOLOGY REPORT*  Clinical Data: Shortness of breath for 3 days  CHEST - 2 VIEW  Comparison: 07/23/2012  Findings: The heart and pulmonary vascularity are within normal limits.  No focal infiltrate or sizable effusion is seen.  No pneumothorax is noted.  There is are stable.  IMPRESSION: No acute abnormality is noted.   Original Report Authenticated By: Alcide Clever, M.D.    Filed Vitals:   08/13/12 0852  BP: 149/104  Pulse: 86  Temp: 97 F (36.1 C)  Resp: 16     1. Sinusitis   2. Bronchitis   3. Chronic pain       MDM  PT with headache, sinus pressure, cough, chills at home. Chronic back pain. Exam consistent with sinusitis and bronchitis. CXR negative. Oxygen sat normal at 97% on RA, pt wheezing, but moving air well, no distress. albuterol inhaler given for breathing. Will cover with an antibiotic. Pt is hypertensive 183/117, will recheck. Review of visits show pt is always hypertensive, instructed to take his BP medications.         Lottie Mussel, [place] 08/13/12 [phone]

## 2012-08-14 ENCOUNTER — Ambulatory Visit (HOSPITAL_COMMUNITY)
Admission: RE | Admit: 2012-08-14 | Discharge: 2012-08-14 | Disposition: A | Discharge status: Home / Self Care | Payer: Self-pay | Source: Ambulatory Visit | Attending: Sports Medicine | Admitting: Sports Medicine

## 2012-08-14 ENCOUNTER — Ambulatory Visit (HOSPITAL_COMMUNITY): Admission: RE | Admit: 2012-08-14 | Payer: Self-pay | Source: Ambulatory Visit

## 2012-08-14 DIAGNOSIS — M51379 Other intervertebral disc degeneration, lumbosacral region without mention of lumbar back pain or lower extremity pain: Secondary | ICD-10-CM | POA: Insufficient documentation

## 2012-08-14 DIAGNOSIS — M47817 Spondylosis without myelopathy or radiculopathy, lumbosacral region: Secondary | ICD-10-CM | POA: Insufficient documentation

## 2012-08-14 DIAGNOSIS — M545 Low back pain: Secondary | ICD-10-CM

## 2012-08-14 DIAGNOSIS — M5137 Other intervertebral disc degeneration, lumbosacral region: Secondary | ICD-10-CM | POA: Insufficient documentation

## 2012-08-14 DIAGNOSIS — M79605 Pain in left leg: Secondary | ICD-10-CM

## 2012-08-15 ENCOUNTER — Encounter: Payer: Self-pay | Admitting: Family Medicine

## 2012-08-15 ENCOUNTER — Ambulatory Visit (INDEPENDENT_AMBULATORY_CARE_PROVIDER_SITE_OTHER): Payer: Self-pay | Admitting: Family Medicine

## 2012-08-15 VITALS — BP 162/98 | HR 97 | Ht 71.0 in | Wt 260.0 lb

## 2012-08-15 DIAGNOSIS — M79605 Pain in left leg: Secondary | ICD-10-CM

## 2012-08-15 DIAGNOSIS — M545 Low back pain, unspecified: Secondary | ICD-10-CM

## 2012-08-15 DIAGNOSIS — M25569 Pain in unspecified knee: Secondary | ICD-10-CM

## 2012-08-15 MED ORDER — OXYCODONE-ACETAMINOPHEN 5-325 MG PO TABS: 1.0000 | ORAL_TABLET | Freq: Two times a day (BID) | ORAL | Status: DC

## 2012-08-15 NOTE — Patient Instructions (Addendum)
Thank you for coming in today. We will try to get you into the injection clinic for your back.  Try very hard to get into the [facility].  For up to 3 months I can provide 62 tabs of 5mg  percocet a month.  I cannot go more than that in this clinic.  Come back in 1 month for a recheck.  Try to get a knee brace.  Come back or go to the emergency room if you notice new weakness new numbness problems walking or bowel or bladder problems.

## 2012-08-15 NOTE — Assessment & Plan Note (Signed)
Encourage patient to buy a patellar stabilization type over-the-counter brace. Discussed quad strengthening exercises. Followup in one month

## 2012-08-15 NOTE — Progress Notes (Signed)
Govanni Plemons is a 46 y.o. male who presents to [facility] today for back and knee pain.   [facility].  His chronic back and knee pain or managed with 60 tablets of Percocet a month.  In the interim he has been without his pain medication in and out of the emergency room with pain flares.  He was seen in the emergency room and referred [facility] for evaluation.  I saw Mr. Nixon at [facility] and because of insurance issues ordered an MRI of his low back and arranged followup today.   1) chronic low back pain with significant radicular symptoms to both legs. Patient had significant radiating pain especially down with left leg.  From a seated position, prolonged standing, and prolonged sitting are significantly exacerbating. Alleviating factors include massage and some pain medications. He denies any significant weakness numbness bowel or bladder dysfunction.    2) chronic left knee pain: Patient has a history consistent with patellar dislocation with recurrent subluxation episodes.  He had some procedure several years ago however he's not sure exactly what happened.  He notes popping catching clicking in his left knee.  He notes significant pain especially with rising from a seated position, going up and down stairs. The pain is relieved by rest.     PMH reviewed. Significant for hypertension and obesity History  Substance Use Topics  . Smoking status: Current Every Day Smoker -- 1.0 packs/day for 20 years    Types: Cigarettes  . Smokeless tobacco: Never Used  . Alcohol Use: No     Comment: rarely  - quit approx. 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 Restricted range of motion to flexion and extension normal rotation  and lateral flexion. Able to walk get on and off exam table by himself.   Reflexes are equal in both lower extremities Strength and sensation is intact in both lower extremities  Left knee:  Normal-appearing. Small portal scars present.  Range of motion 0-120 1+ patellar Positive patellar apprehension test and positive patellar grind test. Negative Lachman's McMurray's obvious her stress.   Distal CV exam shows normal pulses and sensation lower extremities without significant edema.   Mr Lumbar Spine Wo Contrast  08/14/2012  *RADIOLOGY REPORT*  Clinical Data: Low back pain.  Left leg pain.  MRI LUMBAR SPINE WITHOUT CONTRAST  Technique:  Multiplanar and multiecho pulse sequences of the lumbar spine were obtained without intravenous contrast.  Comparison: 04/24/2012  Findings: The lowest full intervertebral disk space is labeled L5- S1.  If procedural intervention is to be performed, careful correlation with this numbering strategy is recommended.  The conus medullaris appears unremarkable.  Conus level:  L1.  No significant vertebral subluxation.  Inversion recovery weighted images demonstrate no significant abnormal vertebral or periligamentous edema.  Disc desiccation noted at L5-S1 and to a lesser extent at L2-3, L3- 4, and L4-5.  No significant loss of disc height.  Paravertebral musculature unremarkable. A single right-sided T2 hyperintense, T1 hypointense renal lesion is noted; this is statistically likely to represent cyst but is not fully evaluated on today's lumbar spine MRI.  Spurring of the left sacroiliac joint noted anteriorly.  Additional findings at individual levels are as follows:  L1-2:  Unremarkable.  L2-3:  Mild right foraminal stenosis and displacement of the right L2 spinal nerve in the lateral extraforaminal space secondary to right foraminal and lateral extraforaminal disc protrusion with right L2 inferior endplate spurring in the right neural foramen. Borderline right  eccentric central stenosis.  L3-4:  Borderline bilateral foraminal stenosis at L3-4 with slight abutment of the left L3 spinal nerves by underlying disc material due to mild disc bulge and intervertebral spurring.  L4-5:  Mild left and borderline right foraminal stenosis due to facet and intervertebral spurring.  Borderline appearance for disc bulge.  L5-S1:  Mild to moderate left and mild right foraminal stenosis secondary to facet spurring, intervertebral spurring, and diffuse disc bulge with right foraminal annular tear.  IMPRESSION:  1.  Mild to moderate impingement L5-S1 and mild impingement L2-3 and L4-5 secondary to spondylosis and degenerative disc disease as detailed above. 2.  Spurring in the upper portion the left sacroiliac joint.   Original Report Authenticated By: Gaylyn Rong, M.D.

## 2012-08-15 NOTE — Assessment & Plan Note (Signed)
Significant chronic low back pain with radicular symptoms correlated to MRI which shows moderate impingement at L5-S1.  Previously well treated with 60 tablets of Percocet a month. This is correlated with the [facility] drug database.  Plan:  Refer to [facility] clinic for consideration of guided epidural steroid injection.  Low back exercises 62 tablets of #5 mg Percocet a month  For up to 3 months or until he establishes with her primary care doctor whichever is sooner.  F/u in 1 month.

## 2012-08-20 ENCOUNTER — Encounter: Payer: Self-pay | Admitting: Physical Medicine & Rehabilitation

## 2012-09-11 ENCOUNTER — Ambulatory Visit (INDEPENDENT_AMBULATORY_CARE_PROVIDER_SITE_OTHER): Payer: Self-pay | Admitting: Family Medicine

## 2012-09-11 ENCOUNTER — Encounter: Payer: Self-pay | Admitting: Family Medicine

## 2012-09-11 VITALS — BP 162/95 | HR 98 | Ht 71.0 in | Wt 278.0 lb

## 2012-09-11 DIAGNOSIS — J449 Chronic obstructive pulmonary disease, unspecified: Secondary | ICD-10-CM | POA: Insufficient documentation

## 2012-09-11 DIAGNOSIS — J4489 Other specified chronic obstructive pulmonary disease: Secondary | ICD-10-CM

## 2012-09-11 DIAGNOSIS — M545 Low back pain, unspecified: Secondary | ICD-10-CM

## 2012-09-11 DIAGNOSIS — M25569 Pain in unspecified knee: Secondary | ICD-10-CM

## 2012-09-11 DIAGNOSIS — F17200 Nicotine dependence, unspecified, uncomplicated: Secondary | ICD-10-CM

## 2012-09-11 DIAGNOSIS — I10 Essential (primary) hypertension: Secondary | ICD-10-CM

## 2012-09-11 DIAGNOSIS — Z720 Tobacco use: Secondary | ICD-10-CM | POA: Insufficient documentation

## 2012-09-11 DIAGNOSIS — M79605 Pain in left leg: Secondary | ICD-10-CM

## 2012-09-11 MED ORDER — OXYCODONE-ACETAMINOPHEN 5-325 MG PO TABS: 1.0000 | ORAL_TABLET | Freq: Two times a day (BID) | ORAL | Status: DC

## 2012-09-11 MED ORDER — LORATADINE 10 MG PO TABS: 10.0000 mg | ORAL_TABLET | Freq: Every day | ORAL | Status: DC

## 2012-09-11 MED ORDER — LISINOPRIL 20 MG PO TABS: 20.0000 mg | ORAL_TABLET | Freq: Every day | ORAL | Status: DC

## 2012-09-11 NOTE — Patient Instructions (Addendum)
Refill for medications today.  Start taking the Lisinopril today.   Will see you back in a month.  Make sure you get set up with [facility].    If you start having chest pain, trouble breathing, being sick on your stomach, or other concerns come back or head to the Emergency Room.   It was good to meet you, have a good Christmas.

## 2012-09-11 NOTE — Assessment & Plan Note (Signed)
Chronic. Has not obtained stabilization brace. Is performing quad exercises.  Has follow-up with Sports Medicine tomorrow.

## 2012-09-11 NOTE — Assessment & Plan Note (Signed)
Counseled to quit.  Patient interested but not quite ready.
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
## 2012-09-11 NOTE — Assessment & Plan Note (Signed)
Not at goal. Restarted Lisinopril today.  FU in 1 week for blood pressure recheck.

## 2012-09-11 NOTE — Assessment & Plan Note (Addendum)
Provided refill of Oxycodone today. However did not obtain narcotic contract due to my oversight. Will obtain this prior to any refills.

## 2012-09-11 NOTE — Assessment & Plan Note (Signed)
Sounds like not adequate treatment.  Will see him back and discuss further in a month. Will recommend long-term inhaler at that time.

## 2012-09-12 ENCOUNTER — Ambulatory Visit: Payer: Self-pay | Admitting: Family Medicine

## 2012-10-08 ENCOUNTER — Encounter: Payer: Self-pay | Admitting: Family Medicine

## 2012-10-08 ENCOUNTER — Ambulatory Visit (INDEPENDENT_AMBULATORY_CARE_PROVIDER_SITE_OTHER): Payer: Self-pay | Admitting: Family Medicine

## 2012-10-08 VITALS — BP 170/108 | HR 103 | Temp 98.1°F | Ht 71.0 in | Wt 276.2 lb

## 2012-10-08 DIAGNOSIS — M79605 Pain in left leg: Secondary | ICD-10-CM

## 2012-10-08 DIAGNOSIS — M545 Low back pain, unspecified: Secondary | ICD-10-CM

## 2012-10-08 DIAGNOSIS — I10 Essential (primary) hypertension: Secondary | ICD-10-CM

## 2012-10-08 MED ORDER — OXYCODONE-ACETAMINOPHEN 5-325 MG PO TABS: 1.0000 | ORAL_TABLET | Freq: Three times a day (TID) | ORAL | Status: DC | PRN

## 2012-10-08 NOTE — Patient Instructions (Addendum)
You can take the Oxycodone 3 times a day.  Take the Alleve 2 pills once or twice a day if you need it.    Stop taking the Meloxicam/Mobic if you are taking the Alleve instead.  Use the back brace when you need it, especially when lifting or after your exercise.  Make sure to keep your appointment with Dr. Wynn Banker.

## 2012-10-09 ENCOUNTER — Telehealth: Payer: Self-pay | Admitting: Family Medicine

## 2012-10-09 NOTE — Progress Notes (Signed)
  Subjective:    Patient ID: Trevor Thomas, male    DOB: Jan 29, 1966, 47 y.o.   MRN: [id number]  HPI  1.  Back pain:  Worsening since I last saw him.  Bed-bound over the holidays due to an exacerbation, rolled over to catch grandson who was crawling on bed and the two of them fell to the floor.  Landed on his back.  Painful at that time.  Has spasm bilateral lumbar region since then.  Is out of his Percocet.    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  Asking if there is anything I can do to move up his appointment to see pain specialist.  Open to back surgery if this will provide relief and allow him to get back to work.  Doing daily back exercises, except when spasms prevent him from bending over.    No fevers or chills.  Pain mostly in lower back.  Non-radiating BL LE's. No saddle anesthesia, no bladder/bowel incontinence.    Review of Systems See HPI above for review of systems.       Objective:   Physical Exam  Gen:  Alert, cooperative patient who appears stated age in no acute distress.  Vital signs reviewed. Back:  Normal skin, Spine with normal alignment and no deformity.  No tenderness to vertebral process palpation.  Paraspinous muscles are tender with spasm BL lumbar region.   Range of motion is full at neck, decreased forward flexion at lumbar sacral regions.  Straight leg raise is positive BL for bilateral lumbar pain Neuro:  Sensation and motor function 5/5 bilateral lower extremities.  Patellar and Achilles  DTR's +2 patellar BL        Assessment & Plan:

## 2012-10-09 NOTE — Telephone Encounter (Signed)
Patient is calling because the back brace he was given is too small and is would like a larger size.  If he can take the one he has someplace and trade it for a larger size that is fine he just needs to know where to go.  He isn't even able to wear it today.  He said that he feels like it needs to be 2 - 3 inches wider.  He also wants to know if he is supposed to contact a surgeon or should he wait to hear back from Dr. Gwendolyn Grant.

## 2012-10-09 NOTE — Assessment & Plan Note (Addendum)
No red flags on examination today or by history.  Refill for narcotics obtained today. Continue Flexeril. He is taking Alleve once daily - can increase to twice daily and stop Mobic as he was not having any relief with this. Not taking Ibuprofen listed on Medication List -- will remove.   Back brace recommended. Continue back rehabilitation exercises. Will consider referral to back surgeon once patient obtains [employer].

## 2012-10-09 NOTE — Assessment & Plan Note (Signed)
Patient declines any increase in BP meds.  States that pain is reason blood pressure is up and when this is under control, his BP will come down to normal level.

## 2012-10-10 NOTE — Telephone Encounter (Signed)
Pt is calling again to see what he can do -

## 2012-10-10 NOTE — Telephone Encounter (Signed)
Called pt to inquire what size brace he has and it is a L so he is going to bring it back to exchange it for XL. Will leave up front for him to p/u.Loralee Pacas Emma

## 2012-10-12 ENCOUNTER — Ambulatory Visit: Payer: Self-pay | Admitting: Family Medicine

## 2012-11-05 ENCOUNTER — Ambulatory Visit (INDEPENDENT_AMBULATORY_CARE_PROVIDER_SITE_OTHER): Payer: No Typology Code available for payment source | Admitting: Family Medicine

## 2012-11-05 ENCOUNTER — Encounter: Payer: Self-pay | Admitting: Family Medicine

## 2012-11-05 VITALS — BP 152/96 | HR 102 | Temp 97.9°F | Ht 71.0 in | Wt 271.2 lb

## 2012-11-05 DIAGNOSIS — M545 Low back pain, unspecified: Secondary | ICD-10-CM

## 2012-11-05 DIAGNOSIS — I10 Essential (primary) hypertension: Secondary | ICD-10-CM

## 2012-11-05 DIAGNOSIS — M79605 Pain in left leg: Secondary | ICD-10-CM

## 2012-11-05 MED ORDER — OXYCODONE-ACETAMINOPHEN 5-325 MG PO TABS: 1.0000 | ORAL_TABLET | Freq: Three times a day (TID) | ORAL | Status: DC | PRN

## 2012-11-05 NOTE — Assessment & Plan Note (Addendum)
Typical pain for him.   Refill for narcotics today.  No red flags on exam today or by history.   Taking Alleve at home.  Not taking Mobic.   Stopped taking Flexeril as not having much relief.  Still has plenty of pills in bottle.  Doesn't enjoy how it dries out his nostrils.  FU with pain clinic early March.
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
## 2012-11-05 NOTE — Assessment & Plan Note (Addendum)
Did not take his BP meds today. FU in 2-3 weeks for blood pressure recheck.  Alleve may not be in his best long-term interest due to HTN.

## 2012-11-05 NOTE — Patient Instructions (Signed)
Make sure to keep the appointment with the pain doctor.   Refill for your pain meds today.  Come back in 2-3 weeks just so we can check your blood pressure at that time.

## 2012-11-08 ENCOUNTER — Ambulatory Visit: Payer: Self-pay | Admitting: Family Medicine

## 2012-11-09 ENCOUNTER — Ambulatory Visit: Payer: No Typology Code available for payment source | Admitting: Family Medicine

## 2012-11-18 ENCOUNTER — Encounter (HOSPITAL_COMMUNITY): Payer: Self-pay | Admitting: Emergency Medicine

## 2012-11-18 ENCOUNTER — Emergency Department (HOSPITAL_COMMUNITY): Payer: No Typology Code available for payment source

## 2012-11-18 ENCOUNTER — Emergency Department (HOSPITAL_COMMUNITY)
Admission: EM | Admit: 2012-11-18 | Discharge: 2012-11-18 | Disposition: A | Discharge status: Home / Self Care | Payer: No Typology Code available for payment source | Attending: Emergency Medicine | Admitting: Emergency Medicine

## 2012-11-18 DIAGNOSIS — Z87828 Personal history of other (healed) physical injury and trauma: Secondary | ICD-10-CM | POA: Insufficient documentation

## 2012-11-18 DIAGNOSIS — Z79899 Other long term (current) drug therapy: Secondary | ICD-10-CM | POA: Insufficient documentation

## 2012-11-18 DIAGNOSIS — Z8669 Personal history of other diseases of the nervous system and sense organs: Secondary | ICD-10-CM | POA: Insufficient documentation

## 2012-11-18 DIAGNOSIS — I10 Essential (primary) hypertension: Secondary | ICD-10-CM | POA: Insufficient documentation

## 2012-11-18 DIAGNOSIS — R0602 Shortness of breath: Secondary | ICD-10-CM | POA: Insufficient documentation

## 2012-11-18 DIAGNOSIS — J45901 Unspecified asthma with (acute) exacerbation: Secondary | ICD-10-CM | POA: Insufficient documentation

## 2012-11-18 DIAGNOSIS — Z8739 Personal history of other diseases of the musculoskeletal system and connective tissue: Secondary | ICD-10-CM | POA: Insufficient documentation

## 2012-11-18 DIAGNOSIS — R5383 Other fatigue: Secondary | ICD-10-CM | POA: Insufficient documentation

## 2012-11-18 DIAGNOSIS — Z8719 Personal history of other diseases of the digestive system: Secondary | ICD-10-CM | POA: Insufficient documentation

## 2012-11-18 DIAGNOSIS — J441 Chronic obstructive pulmonary disease with (acute) exacerbation: Secondary | ICD-10-CM | POA: Insufficient documentation

## 2012-11-18 DIAGNOSIS — R5381 Other malaise: Secondary | ICD-10-CM | POA: Insufficient documentation

## 2012-11-18 DIAGNOSIS — Z8659 Personal history of other mental and behavioral disorders: Secondary | ICD-10-CM | POA: Insufficient documentation

## 2012-11-18 DIAGNOSIS — F17200 Nicotine dependence, unspecified, uncomplicated: Secondary | ICD-10-CM | POA: Insufficient documentation

## 2012-11-18 LAB — BASIC METABOLIC PANEL
BUN: 10 mg/dL (ref 6–23)
CO2: 28 mEq/L (ref 19–32)
Calcium: 9.5 mg/dL (ref 8.4–10.5)
Chloride: 101 mEq/L (ref 96–112)
Creatinine, Ser: 0.72 mg/dL (ref 0.50–1.35)
GFR calc Af Amer: >90 mL/min (ref >90)
GFR calc non Af Amer: >90 mL/min (ref >90)
Glucose, Bld: 96 mg/dL (ref 70–99)
Potassium: 4 mEq/L (ref 3.5–5.1)
Sodium: 138 mEq/L (ref 135–145)

## 2012-11-18 LAB — CBC
HCT: 46.5 % (ref 39.0–52.0)
Hemoglobin: 16.1 g/dL (ref 13.0–17.0)
MCH: 31.6 pg (ref 26.0–34.0)
MCHC: 34.6 g/dL (ref 30.0–36.0)
MCV: 91.2 fL (ref 78.0–100.0)
Platelets: 255 10*3/uL (ref 150–400)
RBC: 5.1 MIL/uL (ref 4.22–5.81)
RDW: 12.4 % (ref 11.5–15.5)
WBC: 9.3 10*3/uL (ref 4.0–10.5)

## 2012-11-18 LAB — POCT I-STAT TROPONIN I: Troponin i, poc: 0 ng/mL (ref 0.00–0.08)

## 2012-11-18 MED ORDER — IPRATROPIUM BROMIDE 0.02 % IN SOLN
0.5000 mg | Freq: Once | RESPIRATORY_TRACT | Status: AC
  Administered 2012-11-18: 0.5 mg via RESPIRATORY_TRACT
  Filled 2012-11-18 (×2): qty 2.5

## 2012-11-18 MED ORDER — PREDNISONE 20 MG PO TABS: 40.0000 mg | ORAL_TABLET | Freq: Every day | ORAL | Status: DC

## 2012-11-18 MED ORDER — HYDROMORPHONE HCL PF 1 MG/ML IJ SOLN
1.0000 mg | Freq: Once | INTRAMUSCULAR | Status: AC
  Administered 2012-11-18: 1 mg via INTRAVENOUS
  Filled 2012-11-18: qty 1

## 2012-11-18 MED ORDER — ONDANSETRON HCL 4 MG/2ML IJ SOLN
4.0000 mg | Freq: Once | INTRAMUSCULAR | Status: AC
  Administered 2012-11-18: 4 mg via INTRAVENOUS
  Filled 2012-11-18: qty 2

## 2012-11-18 MED ORDER — ALBUTEROL SULFATE HFA 108 (90 BASE) MCG/ACT IN AERS
2.0000 | INHALATION_SPRAY | Freq: Once | RESPIRATORY_TRACT | Status: AC
  Administered 2012-11-18: 2 via RESPIRATORY_TRACT
  Filled 2012-11-18: qty 6.7

## 2012-11-18 MED ORDER — METHYLPREDNISOLONE SODIUM SUCC 125 MG IJ SOLR
125.0000 mg | Freq: Once | INTRAMUSCULAR | Status: AC
  Administered 2012-11-18: 125 mg via INTRAVENOUS
  Filled 2012-11-18: qty 2

## 2012-11-18 MED ORDER — ALBUTEROL SULFATE (5 MG/ML) 0.5% IN NEBU
5.0000 mg | INHALATION_SOLUTION | Freq: Once | RESPIRATORY_TRACT | Status: AC
  Administered 2012-11-18: 5 mg via RESPIRATORY_TRACT
  Filled 2012-11-18: qty 0.5

## 2012-11-18 NOTE — ED Notes (Signed)
Patient transported to X-ray 

## 2012-11-18 NOTE — ED Notes (Signed)
Patient reports that for the last couple of days he has felt weak all over, gets very tired easily and having chest pain. The patient reports with any activity he has SOB
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
## 2012-11-18 NOTE — ED Notes (Signed)
Patient having sharp pain under rib cage due to stress per patient report. Chest pain is 8/10, intermittent. Patient also states he has been having pain all over his body, particularly in his back. Has had vomiting and diarrhea.  Suspects he is anxiety attacks due to his stressful home situation.

## 2012-11-30 ENCOUNTER — Ambulatory Visit (INDEPENDENT_AMBULATORY_CARE_PROVIDER_SITE_OTHER): Payer: No Typology Code available for payment source | Admitting: Family Medicine

## 2012-11-30 ENCOUNTER — Encounter: Payer: Self-pay | Admitting: Family Medicine

## 2012-11-30 VITALS — BP 160/110 | HR 114 | Temp 99.1°F | Ht 71.0 in | Wt 268.0 lb

## 2012-11-30 DIAGNOSIS — I10 Essential (primary) hypertension: Secondary | ICD-10-CM

## 2012-11-30 DIAGNOSIS — M545 Low back pain, unspecified: Secondary | ICD-10-CM

## 2012-11-30 DIAGNOSIS — H60392 Other infective otitis externa, left ear: Secondary | ICD-10-CM

## 2012-11-30 DIAGNOSIS — R0600 Dyspnea, unspecified: Secondary | ICD-10-CM | POA: Insufficient documentation

## 2012-11-30 DIAGNOSIS — R0609 Other forms of dyspnea: Secondary | ICD-10-CM

## 2012-11-30 DIAGNOSIS — M79605 Pain in left leg: Secondary | ICD-10-CM

## 2012-11-30 DIAGNOSIS — H60399 Other infective otitis externa, unspecified ear: Secondary | ICD-10-CM | POA: Insufficient documentation

## 2012-11-30 MED ORDER — LORATADINE 10 MG PO TABS: 10.0000 mg | ORAL_TABLET | Freq: Every day | ORAL | Status: DC

## 2012-11-30 MED ORDER — LISINOPRIL 20 MG PO TABS: 20.0000 mg | ORAL_TABLET | Freq: Every day | ORAL | Status: DC

## 2012-11-30 MED ORDER — OXYCODONE-ACETAMINOPHEN 5-325 MG PO TABS: 1.0000 | ORAL_TABLET | Freq: Three times a day (TID) | ORAL | Status: DC | PRN

## 2012-11-30 MED ORDER — NEOMYCIN-POLYMYXIN-HC 3.5-10000-1 OT SOLN: 3.0000 [drp] | Freq: Four times a day (QID) | OTIC | Status: DC

## 2012-11-30 NOTE — Assessment & Plan Note (Signed)
Needs BP recheck. Out of Lisinopril.  Provided this today.   See instructions.
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
## 2012-11-30 NOTE — Assessment & Plan Note (Signed)
Not radiating today. Refill provided for Oxycodone.   My assumption is that if he is accepted at Pain Management we will no longer provide him narcotics. Nursing staff had him sign narcotic contract before I saw him, they did not know he had pain management appt next week.

## 2012-11-30 NOTE — Assessment & Plan Note (Signed)
Cortisporin to treat  

## 2012-11-30 NOTE — Assessment & Plan Note (Signed)
Questionable history of asthma/COPD Refer to Pharmacy clinic today for PFTs and more formal diagnosis.   Counseled to fully quit on smoking, as above stress is barrier.  Declines any pharmacologic treatment for stress relief, plays music which helps

## 2012-11-30 NOTE — Patient Instructions (Signed)
Come back in 1-2 weeks for a nurse visit for blood pressure recheck.    In the next 4-6 weeks, make an appointment to be seen at Pharmacy clinic for a better diagnosis for your lungs.   I'll see you back after you've been seen there.

## 2012-12-03 ENCOUNTER — Ambulatory Visit: Payer: No Typology Code available for payment source | Admitting: Physical Medicine & Rehabilitation

## 2012-12-03 ENCOUNTER — Encounter: Payer: No Typology Code available for payment source | Attending: Physical Medicine & Rehabilitation

## 2012-12-14 ENCOUNTER — Ambulatory Visit (INDEPENDENT_AMBULATORY_CARE_PROVIDER_SITE_OTHER): Payer: No Typology Code available for payment source | Admitting: *Deleted

## 2012-12-14 ENCOUNTER — Other Ambulatory Visit: Payer: Self-pay | Admitting: Family Medicine

## 2012-12-14 VITALS — BP 140/100 | HR 88

## 2012-12-14 DIAGNOSIS — I10 Essential (primary) hypertension: Secondary | ICD-10-CM

## 2012-12-14 MED ORDER — LISINOPRIL-HYDROCHLOROTHIAZIDE 20-25 MG PO TABS: 1.0000 | ORAL_TABLET | Freq: Every day | ORAL | Status: DC

## 2012-12-14 NOTE — Progress Notes (Signed)
BP checked manually using large adult cuff.  BP 140/100 bilaterally. Pulse 88. Consulted with Dr. Gwendolyn Grant and he will start   lisinopril/HCTZ and appointment scheduled to follow up in 2 weeks. Patient voices understanding of medication change.

## 2012-12-14 NOTE — Progress Notes (Signed)
Patient in for BP check. BP checked manually using  large adult cuff.  Bp 140/100 bilaterally. States he has been taking meds regularly. Consulted with Dr. Gwendolyn Grant and he will  Start lisinopril

## 2013-01-01 ENCOUNTER — Telehealth: Payer: Self-pay | Admitting: Home Health Services

## 2013-01-01 ENCOUNTER — Encounter: Payer: Self-pay | Admitting: Family Medicine

## 2013-01-01 ENCOUNTER — Ambulatory Visit (INDEPENDENT_AMBULATORY_CARE_PROVIDER_SITE_OTHER): Payer: No Typology Code available for payment source | Admitting: Family Medicine

## 2013-01-01 VITALS — BP 145/93 | HR 110 | Temp 97.9°F | Ht 71.0 in | Wt 268.9 lb

## 2013-01-01 DIAGNOSIS — R0609 Other forms of dyspnea: Secondary | ICD-10-CM

## 2013-01-01 DIAGNOSIS — F17200 Nicotine dependence, unspecified, uncomplicated: Secondary | ICD-10-CM

## 2013-01-01 DIAGNOSIS — R0989 Other specified symptoms and signs involving the circulatory and respiratory systems: Secondary | ICD-10-CM

## 2013-01-01 DIAGNOSIS — R0600 Dyspnea, unspecified: Secondary | ICD-10-CM

## 2013-01-01 DIAGNOSIS — J309 Allergic rhinitis, unspecified: Secondary | ICD-10-CM

## 2013-01-01 DIAGNOSIS — M545 Low back pain, unspecified: Secondary | ICD-10-CM

## 2013-01-01 DIAGNOSIS — Z720 Tobacco use: Secondary | ICD-10-CM

## 2013-01-01 DIAGNOSIS — I10 Essential (primary) hypertension: Secondary | ICD-10-CM

## 2013-01-01 DIAGNOSIS — M79605 Pain in left leg: Secondary | ICD-10-CM

## 2013-01-01 MED ORDER — OXYCODONE-ACETAMINOPHEN 5-325 MG PO TABS: 1.0000 | ORAL_TABLET | Freq: Three times a day (TID) | ORAL | Status: DC | PRN

## 2013-01-01 NOTE — Assessment & Plan Note (Signed)
No longer radiating. Unfortunately he did not keep his pain management appointment. We will see how he does with increased exercise. If he has any need for increased narcotics we will refer him to another pain management clinic.

## 2013-01-01 NOTE — Telephone Encounter (Signed)
Patient Identified Concern:  High blood pressure, headaches, having to take more medications Stage of Change Patient Is In:  Contemplation, willing to make changes within next 6 months Patient Reported Barriers:  Motivation, stress at home Patient Reported Perceived Benefits:  Not having headaches, not having to take more medications Patient Reports Self-Efficacy:   Pt displays some self efficacy based on past successes. Behavior Change Supports:  Pt reports fiance is also trying to make dietary changes Goals:  To walk dog daily, limit salt intake, take medications daily. Patient Education:  We talked about factors that influence blood pressure going up or down.

## 2013-01-01 NOTE — Assessment & Plan Note (Signed)
Not quite at goal today. His is meeting with health coach today to look for nonpharmacologic means to reduce his blood pressure. Followup with me in about a month.

## 2013-01-01 NOTE — Assessment & Plan Note (Signed)
This along with dyspnea (please see below) his reason for referral to pharmacy clinic. He is interested in smoking cessation.
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
## 2013-01-01 NOTE — Patient Instructions (Signed)
We'll get you to see the health coach today.  You have a pimple/ingrown hair in your ear.  It was good to see you today

## 2013-01-01 NOTE — Assessment & Plan Note (Signed)
Patient with questionable history of COPD. This along with smoking cessation her indications for referral to pharmacy clinic for PFTs as well as smoking cessation counseling.

## 2013-01-01 NOTE — Assessment & Plan Note (Signed)
Patient heart he had 3 refills for his loratadine. He can have this refilled. If worsening will add intranasal corticosteroid.

## 2013-01-10 ENCOUNTER — Ambulatory Visit (INDEPENDENT_AMBULATORY_CARE_PROVIDER_SITE_OTHER): Payer: No Typology Code available for payment source | Admitting: Pharmacist

## 2013-01-10 ENCOUNTER — Encounter: Payer: Self-pay | Admitting: Pharmacist

## 2013-01-10 VITALS — BP 119/77 | HR 93 | Ht 71.0 in | Wt 264.0 lb

## 2013-01-10 DIAGNOSIS — J449 Chronic obstructive pulmonary disease, unspecified: Secondary | ICD-10-CM

## 2013-01-10 DIAGNOSIS — F17200 Nicotine dependence, unspecified, uncomplicated: Secondary | ICD-10-CM

## 2013-01-10 DIAGNOSIS — Z720 Tobacco use: Secondary | ICD-10-CM

## 2013-01-10 NOTE — Patient Instructions (Addendum)
It was nice to meet you!  Consider using Saline Nasal Spray 1-2 sprays each side of your nose multiple times per day.  In addition, please consider using your inhaled steroid medicine up to 2 sprays each nostril twice daily.   Work on quitting smoking.   Next visit with Dr. Gwendolyn Grant.

## 2013-01-10 NOTE — Assessment & Plan Note (Signed)
Severe Nicotine Dependence of 30 years duration in a patient who is poor candidate for success b/c of his lack of motivation to make a plan to quit. He understood that it was important but he is not interested in quitting at this time. I did not initiate nicotine replacement.   Written information provided.  Total time in face-to-face counseling 20 minutes.  Patient seen with Juanita Craver, PharmD candidate.

## 2013-01-10 NOTE — Assessment & Plan Note (Signed)
Spirometry evaluation with Pre and Post Bronchodilator reveals near normal lung function with lung age of 71.  Patient has been experiencing coughing and shortness of breath for a while now and taking albuterol and primatene. Continue current treatment plan at this time.  Reviewed results of pulmonary function tests.  Pt verbalized understanding of results.  Written pt instructions provided.    Severe Nicotine Dependence of 30 years duration in a patient who is poor candidate for success b/c of his lack of motivation to make a plan to quit. He understood that it was important but he is not interested in quitting at this time. I did not initiate nicotine replacement.   Written information provided.  Total time in face-to-face counseling 20 minutes.  Patient seen with Juanita Craver, PharmD candidate.

## 2013-01-10 NOTE — Progress Notes (Signed)
  Subjective:    Patient ID: Trevor Thomas, male    DOB: Dec 14, 1965, 47 y.o.   MRN: [id number]  HPI Patient arrives in good spirits for lung function test and smoking cessation.  Age when started using tobacco on a daily basis 18. Number of Cigarettes per day 20. Most recent quit attempt while incarcerated.  Longest time ever been tobacco free 6 months. Rates IMPORTANCE of quitting tobacco on 1-10 scale of 10. Rates READINESS of quitting tobacco on 1-10 scale of 6. Rates CONFIDENCE of quitting tobacco on 1-10 scale of 5. Triggers to use tobacco include; stress    Review of Systems     Objective:   Physical Exam.   See Documentation Flowsheet (discrete results - PFTs) for complete Spirometry results. Patient provided good effort while attempting spirometry.   Lung Age = 20          Assessment & Plan:   Spirometry evaluation with Pre and Post Bronchodilator reveals near normal lung function with lung age of 51.  Patient has been experiencing coughing and shortness of breath for a while now and taking albuterol and primatene. Continue current treatment plan at this time.  Reviewed results of pulmonary function tests.  Pt verbalized understanding of results.  Written pt instructions provided.    Severe Nicotine Dependence of 30 years duration in a patient who is poor candidate for success b/c of his lack of motivation to make a plan to quit. He understood that it was important but he is not interested in quitting at this time. I did not initiate nicotine replacement.   Written information provided.  Total time in face-to-face counseling 20 minutes.  Patient seen with Juanita Craver, PharmD candidate. Marland Kitchen

## 2013-01-11 NOTE — Progress Notes (Signed)
Patient ID: Trevor Thomas, male   DOB: October 03, 1966, 47 y.o.   MRN: [id number] Reviewed: Agree with Dr. Macky Lower documentation and management.

## 2013-01-31 ENCOUNTER — Ambulatory Visit (INDEPENDENT_AMBULATORY_CARE_PROVIDER_SITE_OTHER): Payer: No Typology Code available for payment source | Admitting: Family Medicine

## 2013-01-31 ENCOUNTER — Ambulatory Visit: Payer: No Typology Code available for payment source | Admitting: Family Medicine

## 2013-01-31 ENCOUNTER — Encounter: Payer: Self-pay | Admitting: Family Medicine

## 2013-01-31 VITALS — BP 117/59 | HR 100 | Temp 99.2°F | Ht 71.0 in | Wt 265.0 lb

## 2013-01-31 DIAGNOSIS — M545 Low back pain, unspecified: Secondary | ICD-10-CM

## 2013-01-31 DIAGNOSIS — M79605 Pain in left leg: Secondary | ICD-10-CM

## 2013-01-31 DIAGNOSIS — I10 Essential (primary) hypertension: Secondary | ICD-10-CM

## 2013-01-31 DIAGNOSIS — J309 Allergic rhinitis, unspecified: Secondary | ICD-10-CM

## 2013-01-31 MED ORDER — OXYCODONE-ACETAMINOPHEN 5-325 MG PO TABS: 1.0000 | ORAL_TABLET | Freq: Three times a day (TID) | ORAL | Status: DC | PRN

## 2013-01-31 NOTE — Assessment & Plan Note (Signed)
Recommended he continue Zyrtec through the spring and attempt to wean himself off in summer.

## 2013-01-31 NOTE — Assessment & Plan Note (Signed)
Functional on Percocet dosing. Have provided him with 3 month refills. I will be gone early August and whoever sees him can supply him with 1 month supply until I return later that month.

## 2013-01-31 NOTE — Patient Instructions (Signed)
I"m glad your blood pressure is doing well.  3 months worth of the Percocet today.    Use the Claritin for your allergies.  It was good to see you today.
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
## 2013-01-31 NOTE — Assessment & Plan Note (Signed)
Much improved today. Believe combination of exercise and medications have helped with this.

## 2013-02-26 ENCOUNTER — Telehealth: Payer: Self-pay | Admitting: Family Medicine

## 2013-02-26 NOTE — Telephone Encounter (Signed)
Pharmacy is calling about his Percocet, they are asking if it is ok to fill the Rx on Friday since they are not open on Saturday.

## 2013-02-26 NOTE — Telephone Encounter (Signed)
Order given ok to refill Friday, per Dr. Gwendolyn Grant.  Catlin Doria, Darlyne Russian, CMA

## 2013-02-26 NOTE — Telephone Encounter (Signed)
That's fine - thanks for checking!

## 2013-02-26 NOTE — Telephone Encounter (Signed)
Will fwd to MD for advice.  Tarek Cravens L, CMA  

## 2013-03-28 ENCOUNTER — Emergency Department (HOSPITAL_COMMUNITY)
Admission: EM | Admit: 2013-03-28 | Discharge: 2013-03-28 | Disposition: A | Discharge status: Home / Self Care | Payer: No Typology Code available for payment source | Attending: Emergency Medicine | Admitting: Emergency Medicine

## 2013-03-28 ENCOUNTER — Emergency Department (HOSPITAL_COMMUNITY): Payer: No Typology Code available for payment source

## 2013-03-28 ENCOUNTER — Encounter (HOSPITAL_COMMUNITY): Payer: Self-pay | Admitting: Emergency Medicine

## 2013-03-28 DIAGNOSIS — M542 Cervicalgia: Secondary | ICD-10-CM | POA: Insufficient documentation

## 2013-03-28 DIAGNOSIS — F329 Major depressive disorder, single episode, unspecified: Secondary | ICD-10-CM | POA: Insufficient documentation

## 2013-03-28 DIAGNOSIS — Y92009 Unspecified place in unspecified non-institutional (private) residence as the place of occurrence of the external cause: Secondary | ICD-10-CM | POA: Insufficient documentation

## 2013-03-28 DIAGNOSIS — Z8679 Personal history of other diseases of the circulatory system: Secondary | ICD-10-CM | POA: Insufficient documentation

## 2013-03-28 DIAGNOSIS — M25519 Pain in unspecified shoulder: Secondary | ICD-10-CM | POA: Insufficient documentation

## 2013-03-28 DIAGNOSIS — F17200 Nicotine dependence, unspecified, uncomplicated: Secondary | ICD-10-CM | POA: Insufficient documentation

## 2013-03-28 DIAGNOSIS — I10 Essential (primary) hypertension: Secondary | ICD-10-CM | POA: Insufficient documentation

## 2013-03-28 DIAGNOSIS — F3289 Other specified depressive episodes: Secondary | ICD-10-CM | POA: Insufficient documentation

## 2013-03-28 DIAGNOSIS — M545 Low back pain, unspecified: Secondary | ICD-10-CM | POA: Insufficient documentation

## 2013-03-28 DIAGNOSIS — Y998 Other external cause status: Secondary | ICD-10-CM | POA: Insufficient documentation

## 2013-03-28 DIAGNOSIS — R209 Unspecified disturbances of skin sensation: Secondary | ICD-10-CM | POA: Insufficient documentation

## 2013-03-28 DIAGNOSIS — F411 Generalized anxiety disorder: Secondary | ICD-10-CM | POA: Insufficient documentation

## 2013-03-28 DIAGNOSIS — M549 Dorsalgia, unspecified: Secondary | ICD-10-CM

## 2013-03-28 DIAGNOSIS — W108XXA Fall (on) (from) other stairs and steps, initial encounter: Secondary | ICD-10-CM | POA: Insufficient documentation

## 2013-03-28 DIAGNOSIS — Z79899 Other long term (current) drug therapy: Secondary | ICD-10-CM | POA: Insufficient documentation

## 2013-03-28 DIAGNOSIS — G8929 Other chronic pain: Secondary | ICD-10-CM | POA: Insufficient documentation

## 2013-03-28 DIAGNOSIS — Z8719 Personal history of other diseases of the digestive system: Secondary | ICD-10-CM | POA: Insufficient documentation

## 2013-03-28 DIAGNOSIS — M129 Arthropathy, unspecified: Secondary | ICD-10-CM | POA: Insufficient documentation

## 2013-03-28 DIAGNOSIS — J45909 Unspecified asthma, uncomplicated: Secondary | ICD-10-CM | POA: Insufficient documentation

## 2013-03-28 DIAGNOSIS — W19XXXA Unspecified fall, initial encounter: Secondary | ICD-10-CM

## 2013-03-28 MED ORDER — HYDROMORPHONE HCL PF 1 MG/ML IJ SOLN
1.0000 mg | Freq: Once | INTRAMUSCULAR | Status: AC
  Administered 2013-03-28: 1 mg via INTRAMUSCULAR
  Filled 2013-03-28: qty 1

## 2013-03-28 MED ORDER — PREDNISONE 20 MG PO TABS: 40.0000 mg | ORAL_TABLET | Freq: Every day | ORAL | Status: DC

## 2013-03-28 MED ORDER — CYCLOBENZAPRINE HCL 10 MG PO TABS: 10.0000 mg | ORAL_TABLET | Freq: Two times a day (BID) | ORAL | Status: DC | PRN

## 2013-03-28 NOTE — ED Notes (Signed)
PA & RN at bedside 

## 2013-03-28 NOTE — ED Provider Notes (Signed)
History    CSN: [id number] Arrival date & time 03/28/13  1242  First MD Initiated Contact with Patient 03/28/13 1252     Chief Complaint  Patient presents with  . Fall   (Consider location/radiation/quality/duration/timing/severity/associated sxs/prior Treatment) HPI  Patient is a 47 yo M PMHx significant for arthritis, chronic back pain presenting to the ED for sharp low back pain w/ a tingling sensation radiating down posterior left leg after falling down four steps at his house earlier today. Patient also has associated left and right shoulder pain with some neck stiffness. Rates his pain 9/10. Patient states he was walking down the steps when he misstepped and slid down 4 steps on his low back. Patient states he did not hit his head or loss consciousness. Patient states he was able to immediately ambulate without difficulty. No alleviating or aggravating factors. Denies nausea, vomiting, CP, SOB, visual disturbances.   Past Medical History  Diagnosis Date  . Hypertension   . COPD (chronic obstructive pulmonary disease)   . Bronchitis     hx of  . Asthma   . Concussion 1983  . Headache(784.0)   . Anxiety   . Depression   . Umbilical hernia   . GERD (gastroesophageal reflux disease)     takes tums/rolaids  . Arthritis   . Head trauma    Past Surgical History  Procedure Laterality Date  . Knee surgery    . Thumb surgery (left)    . Umbilical hernia repair  05/15/2012    Procedure: HERNIA REPAIR UMBILICAL ADULT;  Surgeon: Clovis Pu. Cornett, MD;  Location: [facility] OR;  Service: General;  Laterality: N/A;  . Eye surgery  20 years ago    Metal foreign body removed from Left eye  . Hernia repair  05/2012    umbilical hernia   Family History  Problem Relation Age of Onset  . Diabetes Mother   . Diabetes Father   . Heart failure Brother    History  Substance Use Topics  . Smoking status: Current Every Day Smoker -- 1.00 packs/day for 20 years    Types: Cigarettes  .  Smokeless tobacco: Never Used  . Alcohol Use: No     Comment: rarely  - quit approx. June 2013    Review of Systems  Constitutional: Negative for fever and chills.  HENT: Positive for neck pain and neck stiffness.   Eyes: Negative.   Respiratory: Negative for shortness of breath.   Cardiovascular: Negative for chest pain.  Gastrointestinal: Negative for nausea and vomiting.  Genitourinary: Negative.   Musculoskeletal: Positive for back pain.  Skin: Negative.   Neurological: Negative for dizziness, syncope, light-headedness and headaches.    Allergies  Review of patient's allergies indicates no known allergies.  Home Medications   Current Outpatient Rx  Name  Route  Sig  Dispense  Refill  . albuterol (PROVENTIL HFA;VENTOLIN HFA) 108 (90 BASE) MCG/ACT inhaler   Inhalation   Inhale 2 puffs into the lungs every 6 (six) hours as needed for wheezing.         . cyclobenzaprine (FLEXERIL) 10 MG tablet   Oral   Take 10 mg by mouth 3 (three) times daily as needed. For muscle spasm.         Marland Kitchen lisinopril-hydrochlorothiazide (PRINZIDE,ZESTORETIC) 20-25 MG per tablet   Oral   Take 1 tablet by mouth daily.   90 tablet   3   . oxyCODONE-acetaminophen (PERCOCET/ROXICET) 5-325 MG per tablet   Oral  Take 1 tablet by mouth every 8 (eight) hours as needed for pain.   90 tablet   0   . cyclobenzaprine (FLEXERIL) 10 MG tablet   Oral   Take 1 tablet (10 mg total) by mouth 2 (two) times daily as needed for muscle spasms.   10 tablet   0   . predniSONE (DELTASONE) 20 MG tablet   Oral   Take 2 tablets (40 mg total) by mouth daily.   10 tablet   0    BP 143/82  Pulse 78  Temp(Src) 98.3 F (36.8 C) (Oral)  Resp 22  SpO2 97% Physical Exam  Constitutional: He is oriented to person, place, and time. He appears well-developed and well-nourished. No distress.  HENT:  Head: Normocephalic and atraumatic.  Mouth/Throat: Uvula is midline, oropharynx is clear and moist and mucous  membranes are normal.  Eyes: EOM are normal. Pupils are equal, round, and reactive to light.  Neck: Neck supple. Muscular tenderness present. No rigidity. No edema present.  Cardiovascular: Normal rate, regular rhythm and normal heart sounds.   Pulmonary/Chest: Effort normal and breath sounds normal.  Abdominal: Soft. Bowel sounds are normal.  Musculoskeletal: He exhibits no edema.  Lymphadenopathy:    He has no cervical adenopathy.  Neurological: He is alert and oriented to person, place, and time. He has normal strength. No cranial nerve deficit. Gait normal. GCS eye subscore is 4. GCS verbal subscore is 5. GCS motor subscore is 6.  Skin: Skin is warm, dry and intact. No abrasion, no bruising, no ecchymosis and no rash noted. He is not diaphoretic. No erythema.  Psychiatric: He has a normal mood and affect.    ED Course  Procedures (including critical care time)  Medications  HYDROmorphone (DILAUDID) injection 1 mg (1 mg Intramuscular Given 03/28/13 1356)  HYDROmorphone (DILAUDID) injection 1 mg (1 mg Intramuscular Given 03/28/13 1452)    Labs Reviewed - No data to display Dg Lumbar Spine Complete  03/28/2013   *RADIOLOGY REPORT*  Clinical Data: Fall from three stents, low back pain  LUMBAR SPINE - COMPLETE 4+ VIEW  Comparison: Concurrently obtained radiographs of the pelvis; prior MRI lumbar spine 08/14/2012; CT abdomen/pelvis 04/24/2012  Findings: Frontal, lateral bilateral oblique radiographs of the lumbosacral spine demonstrate no acute fracture or malalignment. Chronic-appearing bilateral pars defects at L5 consistent with spondylolysis.  No anterolisthesis. Correlation with the prior CT abdomen/pelvis from 04/24/2012 confirms the presence of bilateral L5 pars defects.  There are mild changes of degenerative arthropathy at L5-S1.  Very mild degenerative disc changes.  IMPRESSION:  1.  No acute fracture or malalignment.  2.  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    Original Report Authenticated By: Malachy Moan, M.D.   Dg Pelvis 1-2 Views  03/28/2013   *RADIOLOGY REPORT*  Clinical Data: Fall  PELVIS - 1-2 VIEW  Comparison: Concurrently obtained radiographs of the lumbar spine; prior CT abdomen/pelvis 04/24/2012  Findings: A single frontal view of the pelvis demonstrates no acute fracture or malalignment.  There is mild degenerative osteoarthritis of the bilateral hip joints. Normal bony mineralization.  The visualized bowel gas pattern is unremarkable.  IMPRESSION:  No acute fracture or malalignment.  Mild degenerative changes of both hip joints.   Original Report Authenticated By: Malachy Moan, M.D.   Ct Cervical Spine Wo Contrast  03/28/2013   *RADIOLOGY REPORT*  Clinical Data: Fall.  Left shoulder pain that radiates into his neck.  CT CERVICAL SPINE WITHOUT CONTRAST  Technique:  Multidetector CT  imaging of the cervical spine was performed. Multiplanar CT image reconstructions were also generated.  Comparison: 04/08/2010.  Findings: Reversal of the normal cervical reduces may be related to head positioning or spasm.  Prominent right-sided C3-4 facet joint degenerative changes. Cervical spondylotic changes with various degrees of spinal stenosis and foraminal narrowing.  Secondary to patient's habitus and shoulders, evaluation from C5-T1 is suboptimal.  No obvious cervical spine fracture or abnormal prevertebral soft tissue swelling.  Left carotid bifurcation calcifications.  IMPRESSION: Secondary to patient's habitus and shoulders, evaluation from C5-T1 is suboptimal.  No obvious cervical spine fracture or abnormal prevertebral soft tissue swelling.  Please see above.   Original Report Authenticated By: Lacy Duverney, M.D.   1. Back pain   2. Fall, initial encounter     MDM  C-spine cleared. Patient with back pain.  No neurological deficits and normal neuro exam.  Patient can walk but states is painful.  No loss of bowel or bladder control.  No concern  for cauda equina.  No fever, night sweats, weight loss, h/o cancer, IVDU.  RICE protocol and symptomatic care indicated and discussed with patient. Patient d/w with Dr. Rosalia Hammers, agrees with plan. Patient is stable at time of discharge     Jeannetta Ellis, PA-C 03/28/13 2059

## 2013-03-28 NOTE — ED Notes (Signed)
Patient transported to CT 

## 2013-03-28 NOTE — ED Notes (Signed)
Pt was able to ambulate to the bathroom without any assistance from his room.

## 2013-03-28 NOTE — ED Notes (Signed)
Pt to ED via [facility] for evaluation of a fall from standing.  Pt was walking down 3 stairs when his left foot slipped out from underneath him, pt landed on left buttocks and hit his lower left back on one of the stairs.  Pt fully immobilized upon arrival to ED.  LSB removed and cleared with assistance from PA.  Pt states he feels like his left leg "feels like it is asleep"  Able to move all extremities, moves toes well on left foot.  Pt also complaining of left shoulder pain that radiates into his neck.  C-collar left in place.

## 2013-03-29 NOTE — ED Provider Notes (Signed)
History/physical exam/procedure(s) were performed by non-physician practitioner and as supervising physician I was immediately available for consultation/collaboration. I have reviewed all notes and am in agreement with care and plan.   Hilario Quarry, MD 03/29/13 2027

## 2013-04-09 ENCOUNTER — Other Ambulatory Visit: Payer: Self-pay

## 2013-04-09 ENCOUNTER — Emergency Department (HOSPITAL_COMMUNITY)
Admission: EM | Admit: 2013-04-09 | Discharge: 2013-04-10 | Disposition: A | Discharge status: Home / Self Care | Payer: No Typology Code available for payment source | Attending: Emergency Medicine | Admitting: Emergency Medicine

## 2013-04-09 ENCOUNTER — Encounter (HOSPITAL_COMMUNITY): Payer: Self-pay | Admitting: *Deleted

## 2013-04-09 DIAGNOSIS — R220 Localized swelling, mass and lump, head: Secondary | ICD-10-CM | POA: Insufficient documentation

## 2013-04-09 DIAGNOSIS — Z8679 Personal history of other diseases of the circulatory system: Secondary | ICD-10-CM | POA: Insufficient documentation

## 2013-04-09 DIAGNOSIS — Z79899 Other long term (current) drug therapy: Secondary | ICD-10-CM | POA: Insufficient documentation

## 2013-04-09 DIAGNOSIS — T63461A Toxic effect of venom of wasps, accidental (unintentional), initial encounter: Secondary | ICD-10-CM | POA: Insufficient documentation

## 2013-04-09 DIAGNOSIS — Z8659 Personal history of other mental and behavioral disorders: Secondary | ICD-10-CM | POA: Insufficient documentation

## 2013-04-09 DIAGNOSIS — Z8709 Personal history of other diseases of the respiratory system: Secondary | ICD-10-CM | POA: Insufficient documentation

## 2013-04-09 DIAGNOSIS — Z8739 Personal history of other diseases of the musculoskeletal system and connective tissue: Secondary | ICD-10-CM | POA: Insufficient documentation

## 2013-04-09 DIAGNOSIS — R42 Dizziness and giddiness: Secondary | ICD-10-CM | POA: Insufficient documentation

## 2013-04-09 DIAGNOSIS — Y929 Unspecified place or not applicable: Secondary | ICD-10-CM | POA: Insufficient documentation

## 2013-04-09 DIAGNOSIS — T6391XA Toxic effect of contact with unspecified venomous animal, accidental (unintentional), initial encounter: Secondary | ICD-10-CM | POA: Insufficient documentation

## 2013-04-09 DIAGNOSIS — Z87820 Personal history of traumatic brain injury: Secondary | ICD-10-CM | POA: Insufficient documentation

## 2013-04-09 DIAGNOSIS — J45909 Unspecified asthma, uncomplicated: Secondary | ICD-10-CM | POA: Insufficient documentation

## 2013-04-09 DIAGNOSIS — M542 Cervicalgia: Secondary | ICD-10-CM | POA: Insufficient documentation

## 2013-04-09 DIAGNOSIS — R061 Stridor: Secondary | ICD-10-CM | POA: Insufficient documentation

## 2013-04-09 DIAGNOSIS — T782XXA Anaphylactic shock, unspecified, initial encounter: Secondary | ICD-10-CM

## 2013-04-09 DIAGNOSIS — Y939 Activity, unspecified: Secondary | ICD-10-CM | POA: Insufficient documentation

## 2013-04-09 DIAGNOSIS — Z8719 Personal history of other diseases of the digestive system: Secondary | ICD-10-CM | POA: Insufficient documentation

## 2013-04-09 DIAGNOSIS — J392 Other diseases of pharynx: Secondary | ICD-10-CM | POA: Insufficient documentation

## 2013-04-09 DIAGNOSIS — I10 Essential (primary) hypertension: Secondary | ICD-10-CM | POA: Insufficient documentation

## 2013-04-09 DIAGNOSIS — J449 Chronic obstructive pulmonary disease, unspecified: Secondary | ICD-10-CM | POA: Insufficient documentation

## 2013-04-09 DIAGNOSIS — F17200 Nicotine dependence, unspecified, uncomplicated: Secondary | ICD-10-CM | POA: Insufficient documentation

## 2013-04-09 DIAGNOSIS — R1310 Dysphagia, unspecified: Secondary | ICD-10-CM | POA: Insufficient documentation

## 2013-04-09 DIAGNOSIS — R0602 Shortness of breath: Secondary | ICD-10-CM | POA: Insufficient documentation

## 2013-04-09 DIAGNOSIS — J4489 Other specified chronic obstructive pulmonary disease: Secondary | ICD-10-CM | POA: Insufficient documentation

## 2013-04-09 MED ORDER — SODIUM CHLORIDE 0.9 % IV BOLUS (SEPSIS)
1000.0000 mL | Freq: Once | INTRAVENOUS | Status: AC
  Administered 2013-04-09: 1000 mL via INTRAVENOUS

## 2013-04-09 MED ORDER — DIPHENHYDRAMINE HCL 50 MG/ML IJ SOLN: 50.0000 mg | Freq: Once | INTRAMUSCULAR | Status: DC

## 2013-04-09 MED ORDER — FAMOTIDINE IN NACL 20-0.9 MG/50ML-% IV SOLN
20.0000 mg | Freq: Once | INTRAVENOUS | Status: AC
  Administered 2013-04-09: 20 mg via INTRAVENOUS
  Filled 2013-04-09 (×2): qty 50

## 2013-04-09 MED ORDER — METHYLPREDNISOLONE SODIUM SUCC 125 MG IJ SOLR
125.0000 mg | Freq: Once | INTRAMUSCULAR | Status: AC
  Administered 2013-04-09: 125 mg via INTRAVENOUS

## 2013-04-09 MED ORDER — IBUPROFEN 200 MG PO TABS: 600.0000 mg | ORAL_TABLET | Freq: Once | ORAL | Status: DC

## 2013-04-09 MED ORDER — EPINEPHRINE 0.3 MG/0.3ML IJ SOAJ
0.3000 mg | Freq: Once | INTRAMUSCULAR | Status: AC
  Administered 2013-04-09: 0.3 mg via INTRAMUSCULAR
  Filled 2013-04-09: qty 0.3

## 2013-04-09 MED ORDER — OXYCODONE-ACETAMINOPHEN 5-325 MG PO TABS
2.0000 | ORAL_TABLET | Freq: Once | ORAL | Status: AC
  Administered 2013-04-09: 2 via ORAL
  Filled 2013-04-09: qty 2

## 2013-04-09 NOTE — ED Provider Notes (Signed)
History    CSN: [id number] Arrival date & time 04/09/13  2113  First MD Initiated Contact with Patient 04/09/13 2119     Chief Complaint  Patient presents with  . Insect Bite   (Consider location/radiation/quality/duration/timing/severity/associated sxs/prior Treatment) HPI Comments: Patient is a 47 year old male who presents for facial swelling and difficulty breathing with onset 45 minutes ago. Patient states that he was stung by a yellow jacket on the right side of his face 2 hours ago. Shortly after he noticed tingling in his face and a sensation that his face was swelling. This was followed by difficulty swallowing and shortness of breath. Patient denies any modifying factors of his symptoms and states that they have been gradually worsening since onset. Patient admits to bee sting allergy, but denies hx of anaphylaxis. Patient A&Ox3 on initial presentation; in no acute respiratory distress and tolerating secretions.  The history is provided by the patient. No language interpreter was used.   Past Medical History  Diagnosis Date  . Hypertension   . COPD (chronic obstructive pulmonary disease)   . Bronchitis     hx of  . Asthma   . Concussion 1983  . Headache(784.0)   . Anxiety   . Depression   . Umbilical hernia   . GERD (gastroesophageal reflux disease)     takes tums/rolaids  . Arthritis   . Head trauma    Past Surgical History  Procedure Laterality Date  . Knee surgery    . Thumb surgery (left)    . Umbilical hernia repair  05/15/2012    Procedure: HERNIA REPAIR UMBILICAL ADULT;  Surgeon: Clovis Pu. Cornett, MD;  Location: [facility] OR;  Service: General;  Laterality: N/A;  . Eye surgery  20 years ago    Metal foreign body removed from Left eye  . Hernia repair  05/2012    umbilical hernia   Family History  Problem Relation Age of Onset  . Diabetes Mother   . Diabetes Father   . Heart failure Brother    History  Substance Use Topics  . Smoking status: Current Every  Day Smoker -- 1.00 packs/day for 20 years    Types: Cigarettes  . Smokeless tobacco: Never Used  . Alcohol Use: No     Comment: rarely  - quit approx. June 2013    Review of Systems  Constitutional: Negative for fever.  HENT: Positive for facial swelling, drooling (mild), trouble swallowing and neck stiffness.   Eyes: Negative for visual disturbance.  Respiratory: Positive for shortness of breath.   Neurological: Positive for light-headedness.  All other systems reviewed and are negative.   Allergies  Review of patient's allergies indicates no known allergies.  Home Medications   Current Outpatient Rx  Name  Route  Sig  Dispense  Refill  . albuterol (PROVENTIL HFA;VENTOLIN HFA) 108 (90 BASE) MCG/ACT inhaler   Inhalation   Inhale 2 puffs into the lungs every 6 (six) hours as needed for wheezing.         Marland Kitchen lisinopril-hydrochlorothiazide (PRINZIDE,ZESTORETIC) 20-25 MG per tablet   Oral   Take 1 tablet by mouth daily.   90 tablet   3   . oxyCODONE-acetaminophen (PERCOCET/ROXICET) 5-325 MG per tablet   Oral   Take 1 tablet by mouth every 8 (eight) hours as needed for pain.   90 tablet   0   . cyclobenzaprine (FLEXERIL) 10 MG tablet   Oral   Take 1 tablet (10 mg total) by mouth 2 (two) times  daily as needed for muscle spasms.   10 tablet   0    BP 118/71  Pulse 88  Temp(Src) 98.1 F (36.7 C) (Oral)  Resp 18  SpO2 96%  Physical Exam  Nursing note and vitals reviewed. Constitutional: He is oriented to person, place, and time. He appears well-developed and well-nourished.  Patient awake and alert. Answers questions appropriately. In no acute respiratory distress; on 2L [place]  HENT:  Head: Normocephalic and atraumatic. No trismus in the jaw.  Right Ear: External ear normal.  Left Ear: External ear normal.  Nose: Nose normal.  Mouth/Throat: Uvula is midline and mucous membranes are normal. Mucous membranes are not pale. Posterior oropharyngeal edema present. No  oropharyngeal exudate.  Mild soft tissue swelling of R cheek. Mallampati class 3. Uvula midline. Patient tolerating secretions.  Eyes: Conjunctivae and EOM are normal. Pupils are equal, round, and reactive to light. No scleral icterus.  Neck: Normal range of motion.  Mild soft tissue swelling of the right neck appreciated.  Cardiovascular:  Tachycardic rate and regular rhythm. Heart sounds normal. Distal radial pulses 2+ bilaterally  Pulmonary/Chest: Effort normal and breath sounds normal. Stridor (expiratory) present. No respiratory distress. He has no wheezes. He has no rales.  Neurological: He is alert and oriented to person, place, and time.  Skin: Skin is warm and dry. No rash noted. He is not diaphoretic. No pallor.  Psychiatric: He has a normal mood and affect. His behavior is normal.    ED Course  Procedures (including critical care time) Labs Reviewed - No data to display No results found.   Date: 04/09/2013  Rate: 103  Rhythm: sinus tachycardia  QRS Axis: normal  Intervals: normal  ST/T Wave abnormalities: normal  Conduction Disutrbances:none  Narrative Interpretation: Sinus tachycardia; no STEMI or ischemic changes  Old EKG Reviewed: Normal sinus on 11/18/12; otherwise unchanged I have personally reviewed and interpreted this EKG   1. Anaphylaxis, initial encounter     MDM  Patient presents for anaphylaxis after bee sting 2 hours ago. On initial presentation patient is awake and alert in no acute respiratory distress. Posterior oral pharyngeal edema appreciated to be Mallampati class 3. Positive stridor. Patient tolerating secretions without difficulty or drooling. EpiPen, Pepcid, Solu-Medrol, and Benadryl administered in the ED as well as IV fluids. Will continue to monitor to ensure resolution of symptoms.  Patient was observed for 4 hours in ED after receiving EpiPen, Pepcid, Solumedrol, and Benadryl. Patient well and nontoxic appearing and hemodynamically stable. On  reexamination airway patent and patient tolerating secretions without difficulty. There is no stridor and lungs CTAB. Appropriate for d/c with PCP follow up as needed. Indications for ED return discussed. Patient verbalizes comfort and understanding with this d/c plan with no unaddressed concerns.  Filed Vitals:   04/09/13 2110 04/10/13 0107  BP: 145/89 118/71  Pulse:  88  Temp: 99.7 F (37.6 C) 98.1 F (36.7 C)  TempSrc: Oral Oral  Resp:  18  SpO2: 99% 96%     Antony Madura, PA-C 04/10/13 1858

## 2013-04-10 NOTE — ED Provider Notes (Signed)
  This was a shared visit with a mid-level provided (NP or PA).  Throughout the patient's course I was available for consultation/collaboration.  I saw the ECG (if appropriate), relevant labs and studies - I agree with the interpretation.  On my exam the patient was uncomfortable appearing, but receiving IVF.  His onset of Sx ~1 hr after likely precipitant suggests a delayed allergic reaction.  His airway remained patent throughout his ED course, and he improved substantially.      Gerhard Munch, MD 04/10/13 2333

## 2013-04-30 ENCOUNTER — Ambulatory Visit: Payer: Self-pay | Admitting: Family Medicine

## 2013-05-01 ENCOUNTER — Encounter: Payer: Self-pay | Admitting: Family Medicine

## 2013-05-01 ENCOUNTER — Ambulatory Visit (INDEPENDENT_AMBULATORY_CARE_PROVIDER_SITE_OTHER): Payer: No Typology Code available for payment source | Admitting: Family Medicine

## 2013-05-01 VITALS — BP 133/80 | HR 95 | Temp 99.1°F | Ht 71.0 in | Wt 263.0 lb

## 2013-05-01 DIAGNOSIS — M545 Low back pain, unspecified: Secondary | ICD-10-CM

## 2013-05-01 DIAGNOSIS — M79605 Pain in left leg: Secondary | ICD-10-CM

## 2013-05-01 DIAGNOSIS — G8929 Other chronic pain: Secondary | ICD-10-CM | POA: Insufficient documentation

## 2013-05-01 DIAGNOSIS — M549 Dorsalgia, unspecified: Secondary | ICD-10-CM

## 2013-05-01 HISTORY — DX: Other chronic pain: G89.29

## 2013-05-01 MED ORDER — OXYCODONE-ACETAMINOPHEN 5-325 MG PO TABS: 1.0000 | ORAL_TABLET | Freq: Two times a day (BID) | ORAL | Status: DC

## 2013-05-01 NOTE — Patient Instructions (Signed)
You need to come off this medication. It is doing more harm long term to you than the current short term benefit.  I will prescribe you only 30 tab with the recommendation to take it only every 12 hours IF NEEDED and the purpose to wean you off this medication. You need to see you primary doctor for follow up.

## 2013-05-01 NOTE — Assessment & Plan Note (Addendum)
Dicussed with pt in depth my disagreement with current management of his condition and the fact that he has not f/u or agrees to other options of treatment. Today no signs of radiculopathy was found on physical exam.  Pt continues to escalate on narcotic medication. He was making comments about Pain [facility] (which was scheduled but no actual encounter found on EMR) that were intimidating and he was rude to me. He has been positive for Marijuana and Cocaine in the past with last UDS on October/2013.  P/ On pt's best interest I only prescribed 30 tab of Oxycodone with the intention to wean and discontinue this med. I leave for his primary doctor the decision on continuing this pt on narcotic medication knowing that he has true risks for drug abusive behavior.

## 2013-05-01 NOTE — Progress Notes (Signed)
Family Medicine Office Visit Note   Subjective:   Patient ID: Trevor Thomas, male  DOB: [birth year], 47 y.o.. MRN: [id number]   Pt that comes accompanied of his fiance for an office visit. Today his agenda is refilling his Oxycodone. He reports f/u with Dr. Gwendolyn Grant for his chronic back pain and he gets refills every three months.  He has Hx of positive MRI 08/2012 with mild to moderate impingement L5-S1 and mild impingement L2-3 and L4-5 secondary to spondylosis and degenerative disc disease. He was recommended to do go to PMN clinic for consideration of guided epidural steroids injection but never did. He has been escalating narcotic medication from initially Vicodin to Percocet and from BID to TID in 8 months.  After long discussion with pt about risk and benefit of medication he was forceful requesting "his 90 tab of Percocet". He declined any referral to Pain Clinic stating that they were very mean to him to the point that he thought to "do something stupid to them" but after thinking through he did not take any action because he "did not want to go to jail for that"  Pt reports his back pain is 7/10 constantly. He describes it as knots on his lower back. His limitation of function is reported to be pain on his lower back after walking proximately 30 feet when he needs to seat.     Review of Systems:  No saddle anesthesia, incontinence, numbness, tingling or weakness reported by pt.    Objective:   Physical Exam: Gen:  NAD HEENT: Moist mucous membranes  CV: Regular rate and rhythm, no murmurs rubs or gallops PULM: Clear to auscultation bilaterally.  ABD: Soft, non tender, non distended, normal bowel sounds EXT: No edema Neuro: Alert and oriented x3. No focalization MSK: Back - Normal skin, Spine with normal alignment and no deformity.  No tenderness to vertebral process palpation.  Paraspinous muscles are not tender and without spasm.   Range of motion is full at neck and lumbar sacral  regions. Straight leg raise negative on the right or left.   Assessment & Plan:

## 2013-05-02 ENCOUNTER — Telehealth: Payer: Self-pay | Admitting: Family Medicine

## 2013-05-02 NOTE — Telephone Encounter (Signed)
Patient was seen yesterday and would like to speak to the Director about what happened during his appointment.  He and his fiance were in to see Dr. Aviva Signs.  First, they were put in the same room together which they thought was not right, but then when Dr. Aviva Signs came in the room, she was very rude to both of them and treated them like they were drug seekers.  Dr. Gwendolyn Grant did not give him enough of his pain medication to last until he gets back from [place] and she didn't either.  She gave him enough to last for 15 days.  He said that he has tattoos and the way she looked at him was like he was some homeless bum and he has never been treated with such disrespect.  He was so upset he was shaking and went home in tears.  His fiance was treated the same way.  He even asked to see a different doctor and she wouldn't send in a different doctor.

## 2013-05-03 ENCOUNTER — Telehealth: Payer: Self-pay | Admitting: Family Medicine

## 2013-05-03 NOTE — Telephone Encounter (Signed)
Pt is calling again to talk to [place]. He would like you to call him at [phone], because he doesn't want this to be forgotten. JW

## 2013-05-03 NOTE — Telephone Encounter (Signed)
Returned call to patient.  Patient states he and his fiance Lupita Leash Cook--07[birth year]16[birth year]) had an appt with Dr. Aviva Signs on 05/01/13 and were put in same exam room.  Dr. Aviva Signs came in room and asked which one wanted to be seen first.  Afterwards, she said, "I'm not one who gives out narcotics in that category.  I'm trying to get people off this med and only gives pain medicine to chronic cancer patients."  Patient states, I was degraded and felt Dr. Aviva Signs "insulted my doctor's intelligence."  Patient discussed with Dr. Aviva Signs that he has been on oxycodone for about 3 years.  Discussed getting 30 day supply and Dr. Aviva Signs made an offer for him to see another doctor and patient agreed.  Dr. Aviva Signs left the room & came back offering to give #30 tabs, but patient usually gets #90 tabs/month.  Patient agreed because "didn't want to cause a scene and took the #30 tabs."  Has follow-up appt with Dr. Gwendolyn Grant on 05/21/13, but will run out of med early since he was only given #30.  Patient states Dr. Aviva Signs told him that he will have to make the med last until then.  Dr. Aviva Signs also treated Ms. Adriana Simas the same way, but stated that Ms. Adriana Simas "will call and speak for herself."  Patient states he "tossed and turned all night because I was infuriated."  Wanted to take a day and calm down before calling to report issue.  Will route note to PCP, Dr. Pascal Lux, Dr. Jennette Kettle, & Dr. Aviva Signs for review.  Gaylene Brooks, RN

## 2013-05-03 NOTE — Telephone Encounter (Signed)
Please see previous phone note on 05/02/13.  Gaylene Brooks, RN

## 2013-05-09 ENCOUNTER — Telehealth: Payer: Self-pay | Admitting: Family Medicine

## 2013-05-09 ENCOUNTER — Other Ambulatory Visit: Payer: Self-pay | Admitting: Family Medicine

## 2013-05-09 DIAGNOSIS — M545 Low back pain, unspecified: Secondary | ICD-10-CM

## 2013-05-09 DIAGNOSIS — M79605 Pain in left leg: Secondary | ICD-10-CM

## 2013-05-09 MED ORDER — OXYCODONE-ACETAMINOPHEN 5-325 MG PO TABS: 1.0000 | ORAL_TABLET | Freq: Three times a day (TID) | ORAL | Status: DC | PRN

## 2013-05-09 NOTE — Telephone Encounter (Signed)
Patient called for Oxycodone refill,he was seen by Dr Aviva Signs about 1-2 wks ago and was given enough to last for few days till his PCP returns,he called back today stating he is out of his medication. I reviewed his record,he had been on medication for a while however no recent urine toxicology screening on record. Last done is as follow; 07/2012: Positive Cannabinoid. 12/2010: Positive Cocaine and Cannabinoid. 04/2010: positive Cannabinoid. At no time was he positive for opiates,it makes me wonder if he is actually using this medication or diverging them to others. Patient had called multiple times for refill, I gave him 30 pills today pending his follow up with his PCP. I recommend UDS at his next visit. Patient agreed to follow up per Altamese Dilling.

## 2013-05-09 NOTE — Telephone Encounter (Signed)
Called patient and informed that Rx is ready to pick up at front desk.  Patient aware that med needs to last until his appt with Dr. Gwendolyn Grant on 05/21/13.  Gaylene Brooks, RN

## 2013-05-21 ENCOUNTER — Ambulatory Visit (INDEPENDENT_AMBULATORY_CARE_PROVIDER_SITE_OTHER): Payer: No Typology Code available for payment source | Admitting: Family Medicine

## 2013-05-21 ENCOUNTER — Encounter: Payer: Self-pay | Admitting: Family Medicine

## 2013-05-21 VITALS — BP 140/89 | HR 97 | Temp 98.2°F | Ht 71.0 in | Wt 260.0 lb

## 2013-05-21 DIAGNOSIS — M549 Dorsalgia, unspecified: Secondary | ICD-10-CM

## 2013-05-21 DIAGNOSIS — J069 Acute upper respiratory infection, unspecified: Secondary | ICD-10-CM

## 2013-05-21 DIAGNOSIS — G8929 Other chronic pain: Secondary | ICD-10-CM

## 2013-05-21 DIAGNOSIS — M79605 Pain in left leg: Secondary | ICD-10-CM

## 2013-05-21 DIAGNOSIS — M545 Low back pain, unspecified: Secondary | ICD-10-CM

## 2013-05-21 MED ORDER — DOXYCYCLINE HYCLATE 100 MG PO TABS: 100.0000 mg | ORAL_TABLET | Freq: Two times a day (BID) | ORAL | Status: DC

## 2013-05-21 MED ORDER — OXYCODONE-ACETAMINOPHEN 5-325 MG PO TABS: 1.0000 | ORAL_TABLET | Freq: Three times a day (TID) | ORAL | Status: DC | PRN

## 2013-05-21 MED ORDER — LORATADINE 10 MG PO TABS: 10.0000 mg | ORAL_TABLET | Freq: Every day | ORAL | Status: DC

## 2013-05-21 NOTE — Assessment & Plan Note (Signed)
Prior OV noted as well as telephone notes. Had not seen the positive UDS in past. Will need to obtain at next visit.   Had appt scheduled at [facility] prior, but it was during our recent bad snowstorm and he did not keep appt.  Unclear if he can be seen there? He is very anxious about the thoughts for back injections after seeing how they affected his mother.  He will continue to think about this.   Has been on stable dosage of Oxycodone now for over 3 years, on this dosage at [facility]  We had attempted to wean him down, did not tolerate, now back to prior dosage from [facility].   UDS next visit.

## 2013-05-21 NOTE — Patient Instructions (Signed)
Med refills today.  Make sure to take your blood pressure medicine when you get home.  Doxycycline for your fevers and cold.  It was good to see you today

## 2013-05-21 NOTE — Progress Notes (Signed)
Patient ID: Trevor Thomas, male   DOB: 1966/06/12, 47 y.o.   MRN: [id number] Subjective:    Trevor Thomas is a 47 y.o. male who presents to [facility] today for several issues:  1.  Chronic back pain:   Describes aching pain in BL lumbar region of back, worse when active.  Not worse on left versus right side. Pain is 10 / 10 at worst, only relieved with oxycodone usage.  Does little for exercise secondary to pain.  No numbness or paresthesias to bilateral lower extremities.  No LE weakness or changes in gait.  No fevers or chills.  No incontinence of bladder or bowel.    Told me about experiences with another physician here at clinic.  Felt he was "disrespected."  Very angry and upset after the visit.  Well documented in prior OV and telephone notes.  He is no longer upset   2.  URI symptoms:  Present for past 5 - 7 days.  Describes rhinorrhea, sinus congestion, mild cough.  Has tried OTC medications for cough and fever without relief.  Sick contacts are 61 year old nephew.  Daytime subjective fevers, sweats, and chills. No nausea or vomiting.    .  The following portions of the patient's history were reviewed and updated as appropriate: allergies, current medications, past medical history, family and social history, and problem list. Patient is a nonsmoker.    PMH reviewed.  Past Medical History  Diagnosis Date  . Hypertension   . COPD (chronic obstructive pulmonary disease)   . Bronchitis     hx of  . Asthma   . Concussion 1983  . Headache(784.0)   . Anxiety   . Depression   . Umbilical hernia   . GERD (gastroesophageal reflux disease)     takes tums/rolaids  . Arthritis   . Head trauma    Past Surgical History  Procedure Laterality Date  . Knee surgery    . Thumb surgery (left)    . Umbilical hernia repair  05/15/2012    Procedure: HERNIA REPAIR UMBILICAL ADULT;  Surgeon: Clovis Pu. Cornett, MD;  Location: [facility] OR;  Service: General;  Laterality: N/A;  . Eye surgery  20 years ago   Metal foreign body removed from Left eye  . Hernia repair  05/2012    umbilical hernia    Medications reviewed. Current Outpatient Prescriptions  Medication Sig Dispense Refill  . albuterol (PROVENTIL HFA;VENTOLIN HFA) 108 (90 BASE) MCG/ACT inhaler Inhale 2 puffs into the lungs every 6 (six) hours as needed for wheezing.      . cyclobenzaprine (FLEXERIL) 10 MG tablet Take 1 tablet (10 mg total) by mouth 2 (two) times daily as needed for muscle spasms.  10 tablet  0  . doxycycline (VIBRA-TABS) 100 MG tablet Take 1 tablet (100 mg total) by mouth 2 (two) times daily. X 7 days  14 tablet  0  . lisinopril-hydrochlorothiazide (PRINZIDE,ZESTORETIC) 20-25 MG per tablet Take 1 tablet by mouth daily.  90 tablet  3  . loratadine (CLARITIN) 10 MG tablet Take 1 tablet (10 mg total) by mouth daily.  30 tablet  3  . oxyCODONE-acetaminophen (PERCOCET/ROXICET) 5-325 MG per tablet Take 1 tablet by mouth every 8 (eight) hours as needed for pain.  90 tablet  0  . oxyCODONE-acetaminophen (ROXICET) 5-325 MG per tablet Take 1 tablet by mouth every 8 (eight) hours as needed for pain. Do not fill until 60 days from this date  90 tablet  0  . oxyCODONE-acetaminophen (  ROXICET) 5-325 MG per tablet Take 1 tablet by mouth every 8 (eight) hours as needed for pain. Do not fill until 90 days from this date  90 tablet  0   No current facility-administered medications for this visit.    ROS as above otherwise neg.  No chest pain, palpitations, SOB, Fever, Chills, Abd pain, N/V/D.   Objective:   Physical Exam BP 140/89  Pulse 97  Temp(Src) 98.2 F (36.8 C) (Oral)  Ht 5\' 11"  (1.803 m)  Wt 260 lb (117.935 kg)  BMI 36.28 kg/m2 Gen:  Patient sitting on exam table, appears stated age in no acute distress Head: Normocephalic atraumatic Eyes: EOMI.  Pupils reactive BL.  Right sided anisocoria noted, chronic.  Sclera and conjunctiva non-erythematous Ears:  Canals clear bilaterally.  TMs pearly gray bilaterally without erythema  or bulging.   Nose:  Nasal turbinates grossly enlarged bilaterally. Some exudates noted. Tender to palpation of maxillary sinus  Mouth: Mucosa membranes moist. Tonsils +2, nonenlarged, non-erythematous.  Upper dentures in place.   Neck: No cervical lymphadenopathy noted Heart:  RRR, no murmurs auscultated. Pulm:  Clear to auscultation bilaterally with good air movement.  No wheezes or rales noted.   Exts: Non edematous BL  LE, warm and well perfused.   No results found for this or any previous visit (from the past 72 hour(s)).

## 2013-05-21 NOTE — Telephone Encounter (Signed)
Trevor Thomas, is it ok to close this encounter?

## 2013-05-23 ENCOUNTER — Telehealth: Payer: Self-pay | Admitting: Family Medicine

## 2013-05-23 NOTE — Telephone Encounter (Signed)
Pt is calling because he has a boils on left side of his groin and cracks on the bottom on his feet. He would like to talk to Dr. Gwendolyn Grant on what to do. JW

## 2013-05-23 NOTE — Telephone Encounter (Signed)
Pt reports boil on inside of groin - large, red and painful, states that he has been wet and sweating with fever, left testicle is hot and red. Urged pt to go to [facility]ED  " i don't like the people there" - recommended hot soaking baths and TO GO TO UC?ED. Pt verbalized understanding, appointment made for tomorrow.  Also needs different (preferably $4 antibiotic from Tuesday - unable to afford the one prescribed) Wyatt Haste, RN-BSN

## 2013-05-24 ENCOUNTER — Ambulatory Visit (INDEPENDENT_AMBULATORY_CARE_PROVIDER_SITE_OTHER): Payer: No Typology Code available for payment source | Admitting: Family Medicine

## 2013-05-24 ENCOUNTER — Encounter: Payer: Self-pay | Admitting: Family Medicine

## 2013-05-24 VITALS — BP 127/84 | HR 96 | Temp 98.7°F | Ht 71.0 in | Wt 261.9 lb

## 2013-05-24 DIAGNOSIS — L03119 Cellulitis of unspecified part of limb: Secondary | ICD-10-CM

## 2013-05-24 DIAGNOSIS — L02419 Cutaneous abscess of limb, unspecified: Secondary | ICD-10-CM | POA: Insufficient documentation

## 2013-05-24 MED ORDER — SULFAMETHOXAZOLE-TMP DS 800-160 MG PO TABS: 1.0000 | ORAL_TABLET | Freq: Two times a day (BID) | ORAL | Status: DC

## 2013-05-24 NOTE — Patient Instructions (Addendum)
Please start Bactrim this evening.   Make appointment to be seen in the office in 2-3 days.  Please call if your symptoms worsen.

## 2013-05-24 NOTE — Progress Notes (Signed)
  Subjective:    Patient ID: Trevor Thomas, male    DOB: 1966/03/23, 47 y.o.   MRN: [id number]  HPI 47 year old Caucasian male presents with three-day history of worsening "bump" on the left groin area, there has been enlarging, is painful, appears red, is warm to the touch, has not been draining, patient denies fevers or chills, no involvement of the scrotum or penis, no nausea, no vomiting, no abdominal pain, he was recently prescribed doxycycline by his primary care physician however he has not been taking this due to cost, this was prescribed for upper respiratory type symptoms and not for cellulitis   Review of Systems  Constitutional: Negative for fever and chills.  Skin: Positive for color change and rash. Negative for wound.       Objective:   Physical Exam Vitals: reviewed General: Pleasant Caucasian male Cardiac: Regular rate and rhythm, S1 and S2 present, no murmurs Respiratory: Clear to auscultation bilaterally Genitourinary: No scrotal or penile lesions noted Skin: 2 cm x 1 cm area of fluctuance in the left groin area with overlying erythema and warmth to the touch, no active drainage  Incision and Drainage Procedure Note:  The affected area was cleaned and draped in a sterile fashion. Anesthesia was achieved using 8 mL of 1% Lidocaine wo epinephrine injected around the wound area using a 25-guage 1.5 inch needle. An 11-blade scalpel was used to incise the wound. A culture was obtained. A hemostat was used to break any loculations that were present. Iodoform guaze was used to pack the wound. A sterile dressing was applied to the area.The patient tolerated the procedure well. No complications were encountered.        Assessment & Plan:  Please see problem specific assessment and plan.

## 2013-05-24 NOTE — Assessment & Plan Note (Addendum)
Abscess/Cellulitis of left groin. -I&D performed in office -Started Bactrim DS for 7 days.  -Patient to return to office in 2-3 days for followup of wound.

## 2013-05-27 ENCOUNTER — Ambulatory Visit (INDEPENDENT_AMBULATORY_CARE_PROVIDER_SITE_OTHER): Payer: No Typology Code available for payment source | Admitting: Family Medicine

## 2013-05-27 ENCOUNTER — Encounter: Payer: Self-pay | Admitting: Family Medicine

## 2013-05-27 VITALS — BP 126/81 | HR 112 | Ht 71.0 in | Wt 259.0 lb

## 2013-05-27 DIAGNOSIS — L02419 Cutaneous abscess of limb, unspecified: Secondary | ICD-10-CM

## 2013-05-27 DIAGNOSIS — L03119 Cellulitis of unspecified part of limb: Secondary | ICD-10-CM

## 2013-05-27 LAB — WOUND CULTURE
Gram Stain: NONE SEEN
Gram Stain: NONE SEEN

## 2013-05-27 NOTE — Patient Instructions (Addendum)
Continue to take Bactrim as prescribed. Please complete a seven-day course. I would recommend that you do not get the affected area wet for another 2-3 days. He may keep the area covered with Band-Aid.

## 2013-05-27 NOTE — Assessment & Plan Note (Signed)
The patient is clinically improving. He is taking Bactrim as described.  -Packing was removed today, no need for additional packing, wound to heal by secondary intention -Patient to complete seven-day course of Bactrim

## 2013-05-27 NOTE — Progress Notes (Signed)
  Subjective:    Patient ID: Trevor Thomas, male    DOB: [birth year], 47 y.o.   MRN: [id number]  HPI 46 year old male presents for followup of cellulitis/Abscess of the left groin area. He underwent cesarean drainage on 05/24/2013 and has completed 3 days of a seven-day course of Bactrim. He has had less pain in the area, denies fevers or chills, minimal drainage of the area, therefore cause is still intact, he is keeping the area covered with a Band-Aid, no red streaking per patient to   Review of Systems  Constitutional: Negative for fever and chills.  Skin: Positive for wound.       Objective:   Physical Exam Vitals: Reviewed General: Pleasant male, no acute distress Skin: Previous area of cellulitis in the left groin appears to be improving, iodoform gauze is still present and is removed today, no active drainage, decreased warmth and decreased erythema       Assessment & Plan:  Please see problem specific assessment and plan.

## 2013-06-05 NOTE — Addendum Note (Signed)
Addended by: Uvaldo Rising on: 06/05/2013 02:35 PM   Modules accepted: Level of Service

## 2013-06-05 NOTE — Addendum Note (Signed)
Addended by: Orbie Grupe J on: 06/05/2013 02:35 PM   Modules accepted: Level of Service  

## 2013-06-23 ENCOUNTER — Emergency Department (HOSPITAL_COMMUNITY): Payer: No Typology Code available for payment source

## 2013-06-23 ENCOUNTER — Encounter (HOSPITAL_COMMUNITY): Payer: Self-pay | Admitting: *Deleted

## 2013-06-23 ENCOUNTER — Emergency Department (HOSPITAL_COMMUNITY)
Admission: EM | Admit: 2013-06-23 | Discharge: 2013-06-23 | Discharge status: Against Medical Advice | Payer: No Typology Code available for payment source | Attending: Emergency Medicine | Admitting: Emergency Medicine

## 2013-06-23 DIAGNOSIS — Z79899 Other long term (current) drug therapy: Secondary | ICD-10-CM | POA: Insufficient documentation

## 2013-06-23 DIAGNOSIS — IMO0001 Reserved for inherently not codable concepts without codable children: Secondary | ICD-10-CM | POA: Insufficient documentation

## 2013-06-23 DIAGNOSIS — J4489 Other specified chronic obstructive pulmonary disease: Secondary | ICD-10-CM | POA: Insufficient documentation

## 2013-06-23 DIAGNOSIS — Z8669 Personal history of other diseases of the nervous system and sense organs: Secondary | ICD-10-CM | POA: Insufficient documentation

## 2013-06-23 DIAGNOSIS — J449 Chronic obstructive pulmonary disease, unspecified: Secondary | ICD-10-CM | POA: Insufficient documentation

## 2013-06-23 DIAGNOSIS — M79609 Pain in unspecified limb: Secondary | ICD-10-CM | POA: Insufficient documentation

## 2013-06-23 DIAGNOSIS — I10 Essential (primary) hypertension: Secondary | ICD-10-CM | POA: Insufficient documentation

## 2013-06-23 DIAGNOSIS — Z8719 Personal history of other diseases of the digestive system: Secondary | ICD-10-CM | POA: Insufficient documentation

## 2013-06-23 DIAGNOSIS — G8929 Other chronic pain: Secondary | ICD-10-CM | POA: Insufficient documentation

## 2013-06-23 DIAGNOSIS — F17200 Nicotine dependence, unspecified, uncomplicated: Secondary | ICD-10-CM | POA: Insufficient documentation

## 2013-06-23 DIAGNOSIS — Z8659 Personal history of other mental and behavioral disorders: Secondary | ICD-10-CM | POA: Insufficient documentation

## 2013-06-23 DIAGNOSIS — Z8739 Personal history of other diseases of the musculoskeletal system and connective tissue: Secondary | ICD-10-CM | POA: Insufficient documentation

## 2013-06-23 LAB — CBC
HCT: 44.2 % (ref 39.0–52.0)
Hemoglobin: 15.4 g/dL (ref 13.0–17.0)
MCH: 31.1 pg (ref 26.0–34.0)
MCHC: 34.8 g/dL (ref 30.0–36.0)
MCV: 89.3 fL (ref 78.0–100.0)
Platelets: 259 10*3/uL (ref 150–400)
RBC: 4.95 MIL/uL (ref 4.22–5.81)
RDW: 12.8 % (ref 11.5–15.5)
WBC: 9.5 10*3/uL (ref 4.0–10.5)

## 2013-06-23 LAB — BASIC METABOLIC PANEL
BUN: 13 mg/dL (ref 6–23)
CO2: 30 mEq/L (ref 19–32)
Calcium: 9.2 mg/dL (ref 8.4–10.5)
Chloride: 98 mEq/L (ref 96–112)
Creatinine, Ser: 0.88 mg/dL (ref 0.50–1.35)
GFR calc Af Amer: >90 mL/min (ref >90)
GFR calc non Af Amer: >90 mL/min (ref >90)
Glucose, Bld: 103 mg/dL — ABNORMAL HIGH (ref 70–99)
Potassium: 4.2 mEq/L (ref 3.5–5.1)
Sodium: 136 mEq/L (ref 135–145)

## 2013-06-23 LAB — SEDIMENTATION RATE: Sed Rate: 14 mm/hr (ref 0–16)

## 2013-06-23 LAB — C-REACTIVE PROTEIN: CRP: 0.5 mg/dL — ABNORMAL LOW (ref <0.60)

## 2013-06-23 MED ORDER — MORPHINE SULFATE 4 MG/ML IJ SOLN
4.0000 mg | Freq: Once | INTRAMUSCULAR | Status: DC
  Filled 2013-06-23: qty 1

## 2013-06-23 MED ORDER — OXYCODONE-ACETAMINOPHEN 5-325 MG PO TABS: 2.0000 | ORAL_TABLET | Freq: Once | ORAL | Status: DC

## 2013-06-23 NOTE — ED Provider Notes (Signed)
CSN: [id number]     Arrival date & time 06/23/13  1222 History  This chart was scribed for non-physician practitioner Junius Finner, PA-C, working with Shanna Cisco, MD by Dorothey Baseman, ED Scribe. This patient was seen in room TR07C/TR07C and the patient's care was started at 1:33 PM.    Chief Complaint  Patient presents with  . Hand Pain   The history is provided by the patient. No language interpreter was used.   HPI Comments: Trevor Thomas is a 47 y.o. male who presents to the Emergency Department complaining of bilateral hand pain, right is worse than the left, described as a burning that shoots into his right forearm onset 3-4 days ago. He reports some associated pus drainage from the right hand. He states that he has a history of surgery to the left hand. He states that he received sutures to the right hand 1 month ago secondary to a laceration that occurred on a windshield. He reports that he did not have the sutures removed, but that they fell out on their own. He reports that he was given bactrim and finished the course. Patient reports that he takes Roxicet daily for chronic back pain. He reports feeling warm and aggravated at night. He denies fever, nausea, vomiting, or any other symptoms at this time. He denies any allergies to medications.   Past Medical History  Diagnosis Date  . Hypertension   . COPD (chronic obstructive pulmonary disease)   . Bronchitis     hx of  . Asthma   . Concussion 1983  . Headache(784.0)   . Anxiety   . Depression   . Umbilical hernia   . GERD (gastroesophageal reflux disease)     takes tums/rolaids  . Arthritis   . Head trauma   . 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 Knee surgery    . Thumb surgery (left)    . Umbilical hernia repair  05/15/2012    Procedure: HERNIA REPAIR UMBILICAL ADULT;  Surgeon: Clovis Pu. Cornett, MD;  Location: [facility] OR;  Service:  General;  Laterality: N/A;  . Hernia repair  05/2012    umbilical hernia  . Eye surgery  20 years ago    Metal foreign body removed from Right eye   Family History  Problem Relation Age of Onset  . Diabetes Mother   . Diabetes Father   . Heart failure Brother    History  Substance Use Topics  . Smoking status: Current Every Day Smoker -- 0.75 packs/day for 20 years    Types: Cigarettes  . Smokeless tobacco: Never Used  . Alcohol Use: No     Comment: rarely  - quit approx. June 2013    Review of Systems  Gastrointestinal: Negative for nausea and vomiting.  Musculoskeletal: Positive for myalgias.  All other systems reviewed and are negative.    Allergies  Review of patient's allergies indicates no known allergies.  Home Medications   Current Outpatient Rx  Name  Route  Sig  Dispense  Refill  . albuterol (PROVENTIL HFA;VENTOLIN HFA) 108 (90 BASE) MCG/ACT inhaler   Inhalation   Inhale 2 puffs into the lungs every 6 (six) hours as needed for wheezing.         Marland Kitchen lisinopril-hydrochlorothiazide (PRINZIDE,ZESTORETIC) 20-25 MG per tablet   Oral   Take 1 tablet by mouth daily.   90 tablet  3   . loratadine (CLARITIN) 10 MG tablet   Oral   Take 1 tablet (10 mg total) by mouth daily.   30 tablet   3   . oxyCODONE-acetaminophen (ROXICET) 5-325 MG per tablet   Oral   Take 1 tablet by mouth every 8 (eight) hours as needed for pain. Do not fill until 90 days from this date   90 tablet   0    Triage Vitals: BP 140/91  Pulse 97  Temp(Src) 97.9 F (36.6 C)  Resp 18  SpO2 97%  Physical Exam  Nursing note and vitals reviewed. Constitutional: He is oriented to person, place, and time. He appears well-developed and well-nourished.  HENT:  Head: Normocephalic and atraumatic.  Eyes: EOM are normal.  Neck: Normal range of motion.  Cardiovascular: Normal rate.   Pulmonary/Chest: Effort normal.  Musculoskeletal: Normal range of motion. He exhibits edema and tenderness.        Hands: Right index finger moderate edema over PIP joint. Limited flexion at PIP and DIP. Significant tenderness to palpation.  Normal sensation to light touch. Radial pulse 2+. < 2 second capillary refill.   Neurological: He is alert and oriented to person, place, and time.  Skin: Skin is warm and dry.  Erythema. Laceration over PIP joint surrounded by calloused skin. No active drainage. No red streaking.  Psychiatric: He has a normal mood and affect. His behavior is normal.    ED Course  Procedures (including critical care time)  Medications - No data to display DIAGNOSTIC STUDIES: Oxygen Saturation is 97% on room air, normal by my interpretation.    COORDINATION OF CARE: 1:36PM- Advised patient that symptoms are likely due to an infection. Will consult to hand surgery, Dr. Romana Juniper.  Discussed treatment plan with patient at bedside and patient verbalized agreement.   IM morphine was discussed however Dr. Amanda Pea advised to hold off on pain medication until pt could be evaluated.      Labs Review Labs Reviewed  BASIC METABOLIC PANEL - Abnormal; Notable for the following:    Glucose, Bld 103 (*)    All other components within normal limits  CBC  C-REACTIVE PROTEIN  SEDIMENTATION RATE   Imaging Review Dg Hand Complete Right  06/23/2013   *RADIOLOGY REPORT*  Clinical Data: Injury to the distal aspect of index finger 1 month ago, stitches fell out but the wound is still causing pain.  RIGHT HAND - COMPLETE 3+ VIEW  Comparison: None.  Findings: There is no acute fracture or dislocation.  There is no evidence of osteomyelitis of the second digit. Arthritic changes of the distal second and third metacarpals are identified.  IMPRESSION: No evidence of osteomyelitis of the index finger.   Original Report Authenticated By: Sherian Rein, M.D.    MDM   1. Pain of finger, right     Pt c/o worsening pain, swelling, and drainage from PIP joint of right index finger.  Concern for tendon  and/or joint infection. Dr. Amanda Pea made aware of pt and plans to evaluate in ED.  4:21 PM Pt left AMA.  States he has chronic pain in his back and now in his hand and cannot wait any longer without pain medication, food, and cigarettes (pt was offered nicotine patch earlier but he declined).   I personally performed the services described in this documentation, which was scribed in my presence. The recorded information has been reviewed and is accurate.     Junius Finner, PA-C 06/23/13 1622

## 2013-06-23 NOTE — ED Provider Notes (Signed)
Medical screening examination/treatment/procedure(s) were performed by non-physician practitioner and as supervising physician I was immediately available for consultation/collaboration.   Blu Mcglaun E Tinia Oravec, MD 06/23/13 1635 

## 2013-06-23 NOTE — ED Notes (Signed)
Pt is here with bilateral hand swelling and ? Infected area to right index finger.  Pt states hands feel like they are on fire

## 2013-06-24 ENCOUNTER — Emergency Department (HOSPITAL_COMMUNITY)
Admission: EM | Admit: 2013-06-24 | Discharge: 2013-06-24 | Disposition: A | Discharge status: Home / Self Care | Payer: No Typology Code available for payment source | Attending: Emergency Medicine | Admitting: Emergency Medicine

## 2013-06-24 ENCOUNTER — Encounter (HOSPITAL_COMMUNITY): Payer: Self-pay | Admitting: Emergency Medicine

## 2013-06-24 DIAGNOSIS — F17200 Nicotine dependence, unspecified, uncomplicated: Secondary | ICD-10-CM | POA: Insufficient documentation

## 2013-06-24 DIAGNOSIS — Z8669 Personal history of other diseases of the nervous system and sense organs: Secondary | ICD-10-CM | POA: Insufficient documentation

## 2013-06-24 DIAGNOSIS — S61209D Unspecified open wound of unspecified finger without damage to nail, subsequent encounter: Secondary | ICD-10-CM

## 2013-06-24 DIAGNOSIS — J449 Chronic obstructive pulmonary disease, unspecified: Secondary | ICD-10-CM | POA: Insufficient documentation

## 2013-06-24 DIAGNOSIS — I10 Essential (primary) hypertension: Secondary | ICD-10-CM | POA: Insufficient documentation

## 2013-06-24 DIAGNOSIS — M79609 Pain in unspecified limb: Secondary | ICD-10-CM | POA: Insufficient documentation

## 2013-06-24 DIAGNOSIS — Z8659 Personal history of other mental and behavioral disorders: Secondary | ICD-10-CM | POA: Insufficient documentation

## 2013-06-24 DIAGNOSIS — Z8719 Personal history of other diseases of the digestive system: Secondary | ICD-10-CM | POA: Insufficient documentation

## 2013-06-24 DIAGNOSIS — G8911 Acute pain due to trauma: Secondary | ICD-10-CM | POA: Insufficient documentation

## 2013-06-24 DIAGNOSIS — J4489 Other specified chronic obstructive pulmonary disease: Secondary | ICD-10-CM | POA: Insufficient documentation

## 2013-06-24 DIAGNOSIS — Z8739 Personal history of other diseases of the musculoskeletal system and connective tissue: Secondary | ICD-10-CM | POA: Insufficient documentation

## 2013-06-24 LAB — CBC WITH DIFFERENTIAL/PLATELET
Basophils Absolute: 0 10*3/uL (ref 0.0–0.1)
Basophils Relative: 0 % (ref 0–1)
Eosinophils Absolute: 0.2 10*3/uL (ref 0.0–0.7)
HCT: 42.9 % (ref 39.0–52.0)
Hemoglobin: 15 g/dL (ref 13.0–17.0)
Lymphs Abs: 3.6 10*3/uL (ref 0.7–4.0)
MCH: 30.9 pg (ref 26.0–34.0)
MCHC: 35 g/dL (ref 30.0–36.0)
MCV: 88.5 fL (ref 78.0–100.0)
Monocytes Absolute: 0.6 10*3/uL (ref 0.1–1.0)
Neutro Abs: 4.4 10*3/uL (ref 1.7–7.7)

## 2013-06-24 NOTE — ED Notes (Signed)
Charge nurse talked with pt, pt given food and drink

## 2013-06-24 NOTE — ED Notes (Signed)
Pt reports coming to [facility] yesterday and due to extended wait times left and went to [facility]. Pt states after being evaluated and awaiting to see a surgeon to evaluate his right index finger that he left [facility] because he was hungry and wanted to go home. Pt states the surgeon "was in another case and couldn't come see me for several hours, so I got irritated and left". Pt reports cutting his right index finger on a broken windshield and obtained stiches, which came loose and the patient removed them himself. Pt reports yellow pus coming from the wound. Pt reports laboratory work and x-rays were taken yesterday. Pt is A/O x4 and in NAD.

## 2013-06-24 NOTE — ED Provider Notes (Signed)
CSN: [id number]     Arrival date & time 06/24/13  1202 History   First MD Initiated Contact with Patient 06/24/13 1310     Chief Complaint  Patient presents with  . Hand Pain   (Consider location/radiation/quality/duration/timing/severity/associated sxs/prior Treatment) HPI  Trevor Thomas is a 47 y.o. male with past medical history significant for COPD, active daily smoker with hypertension complaining of nonhealing wound to right index finger, actively draining purulent material. Patient's had initial laceration one month ago on a broken windshield, states he obtained sutures the patient removed himself. States that he was working on a car several weeks ago and re\re opened the wound. States that since that time there has been active drainage. Patient denies fever, endorses reduced range of motion both flexion and extension. Patient was seen at [facility] yesterday and signed out AMA before Dr. Amanda Pea they could evaluate him.  Past Medical History  Diagnosis Date  . Hypertension   . COPD (chronic obstructive pulmonary disease)   . Bronchitis     hx of  . Asthma   . Concussion 1983  . Headache(784.0)   . Anxiety   . Depression   . Umbilical hernia   . GERD (gastroesophageal reflux disease)     takes tums/rolaids  . Arthritis   . Head trauma   . 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 Knee surgery    . Thumb surgery (left)    . Umbilical hernia repair  05/15/2012    Procedure: HERNIA REPAIR UMBILICAL ADULT;  Surgeon: Clovis Pu. Cornett, MD;  Location: [facility] OR;  Service: General;  Laterality: N/A;  . Hernia repair  05/2012    umbilical hernia  . Eye surgery  20 years ago    Metal foreign body removed from Right eye   Family History  Problem Relation Age of Onset  . Diabetes Mother   . Diabetes Father   . Heart failure Brother    History  Substance Use Topics  . Smoking status: Current  Every Day Smoker -- 0.75 packs/day for 20 years    Types: Cigarettes  . Smokeless tobacco: Never Used  . Alcohol Use: No     Comment: rarely  - quit approx. June 2013    Review of Systems 10 systems reviewed and found to be negative, except as noted in the HPI  Allergies  Review of patient's allergies indicates no known allergies.  Home Medications   Current Outpatient Rx  Name  Route  Sig  Dispense  Refill  . albuterol (PROVENTIL HFA;VENTOLIN HFA) 108 (90 BASE) MCG/ACT inhaler   Inhalation   Inhale 2 puffs into the lungs every 6 (six) hours as needed for wheezing.         Marland Kitchen lisinopril-hydrochlorothiazide (PRINZIDE,ZESTORETIC) 20-25 MG per tablet   Oral   Take 1 tablet by mouth every morning.         . loratadine (CLARITIN) 10 MG tablet   Oral   Take 10 mg by mouth every morning.         Marland Kitchen oxyCODONE-acetaminophen (ROXICET) 5-325 MG per tablet   Oral   Take 1 tablet by mouth every 8 (eight) hours as needed for pain. Do not fill until 90 days from this date   90 tablet   0    BP 128/86  Pulse 91  Temp(Src) 98.2 F (36.8 C) (Oral)  Resp 18  SpO2 97% Physical Exam  Nursing note and vitals reviewed. Constitutional: He is oriented to person, place, and time. He appears well-developed and well-nourished. No distress.  HENT:  Head: Normocephalic.  Mouth/Throat: Oropharynx is clear and moist.  Eyes: Conjunctivae and EOM are normal. Pupils are equal, round, and reactive to light.  Cardiovascular: Normal rate and regular rhythm.   Pulmonary/Chest: Effort normal and breath sounds normal. No stridor. No respiratory distress. He has no wheezes. He has no rales. He exhibits no tenderness.  Abdominal: Soft. Bowel sounds are normal. He exhibits no distension and no mass. There is no tenderness. There is no rebound and no guarding.  Musculoskeletal: Normal range of motion.  Neurological: He is alert and oriented to person, place, and time.  Skin:  Patient has open deep wound  with skin overgrowth 2 right or second digits PIP is tender to palpation, mildly erythematous, there is no streaking, there is no discharge.  Patient has reduced range of motion in both flexion and extension.  Neurovascularly intact  Psychiatric: He has a normal mood and affect.      ED Course  Procedures (including critical care time) Labs Review Labs Reviewed - No data to display Imaging Review Dg Hand Complete Right  06/23/2013   *RADIOLOGY REPORT*  Clinical Data: Injury to the distal aspect of index finger 1 month ago, stitches fell out but the wound is still causing pain.  RIGHT HAND - COMPLETE 3+ VIEW  Comparison: None.  Findings: There is no acute fracture or dislocation.  There is no evidence of osteomyelitis of the second digit. Arthritic changes of the distal second and third metacarpals are identified.  IMPRESSION: No evidence of osteomyelitis of the index finger.   Original Report Authenticated By: Sherian Rein, M.D.    MDM   1. Wound, open, finger, subsequent encounter    Filed Vitals:   06/24/13 1219  BP: 128/86  Pulse: 91  Temp: 98.2 F (36.8 C)  TempSrc: Oral  Resp: 18  SpO2: 97%     Trevor Thomas is a 47 y.o. male with possible infected, nonhealing wound to right index finger dorsal side of PIP. Physical exam is reassuring with no discharge or signs of overt cellulitis. X-ray obtained yet yesterday showed no signs of osteomyelitis. Blood work today shows no leukocytosis.  Hand surgery consult from Dr. Mina Marble appreciated: He does not feel the patient needs emergent evaluation at this time. He would like to see the patient in the office on Thursday. Also recommends wet to dry dressings. I discussed this with the patient also instructed him on specific return precautions.  Pt is hemodynamically stable, appropriate for, and amenable to discharge at this time. Pt verbalized understanding and agrees with care plan. All questions answered. Outpatient follow-up and  specific return precautions discussed.    Note: Portions of this report may have been transcribed using voice recognition software. Every effort was made to ensure accuracy; however, inadvertent computerized transcription errors may be present    Wynetta Emery, PA-C 06/24/13 1631

## 2013-06-25 NOTE — ED Provider Notes (Signed)
Medical screening examination/treatment/procedure(s) were performed by non-physician practitioner and as supervising physician I was immediately available for consultation/collaboration.  Bladen Umar T Odalys Win, MD 06/25/13 0709 

## 2013-06-28 ENCOUNTER — Encounter: Payer: Self-pay | Admitting: Family Medicine

## 2013-06-28 ENCOUNTER — Ambulatory Visit (INDEPENDENT_AMBULATORY_CARE_PROVIDER_SITE_OTHER): Payer: No Typology Code available for payment source | Admitting: Family Medicine

## 2013-06-28 VITALS — BP 140/98 | HR 92 | Temp 98.0°F | Ht 71.0 in | Wt 260.0 lb

## 2013-06-28 DIAGNOSIS — Z5189 Encounter for other specified aftercare: Secondary | ICD-10-CM

## 2013-06-28 DIAGNOSIS — S61219D Laceration without foreign body of unspecified finger without damage to nail, subsequent encounter: Secondary | ICD-10-CM

## 2013-06-28 DIAGNOSIS — S61219A Laceration without foreign body of unspecified finger without damage to nail, initial encounter: Secondary | ICD-10-CM | POA: Insufficient documentation

## 2013-06-28 NOTE — Assessment & Plan Note (Addendum)
Month-old injury of index finger involving glass with poor healing despite abx and suture repair. Currently limiting flexion. No signs of immediate infection. Concerning for retained glass foreign body limiting healing. Will get MRI to assess for this and tendon injury. OTC NSAIDs for discomfort.

## 2013-06-28 NOTE — Patient Instructions (Addendum)
Thank you for coming in today!  I am concerned there may be some glass in your finger and you need an MRI to look for this. You have an appointment for this:  Jul 04, 2013 at 11:15am  [address]  [phone] I will call you with these results and we will go from there. Until then, you can take ibuprofen 600mg  every 6 hours as needed for pain.  If you develop a fever or chills, or the wound gets worse or starts draining pus seek medical attention immediately.   Please feel free to call with any questions or concerns at any time, at [phone]. - Dr. Jarvis Newcomer

## 2013-06-28 NOTE — Progress Notes (Signed)
Patient ID: Trevor Thomas, male   DOB: [birth year], 47 y.o.   MRN: [id number] Subjective:  HPI:   Trevor Thomas is a 47 y.o. male with here for hospital f/u for laceration of right index finger  He reports sustaining a laceration of his right index finger while replacing a windshield. He went to a hospital in [place], [place] where the wound was sutured, but he pulled these sutures out 4 days after placement and the wound reopened shortly thereafter. He has since received a 7-day course of bactrim, reports finishing the entire prescription with only moderate alleviation of discomfort. He recently presented to the [facility] ER where an xray showed no osteomyelitis.   The pain is severe, radiating up the arm intermittently, and has remained about the same for the past few weeks. It limits movement of his finger and other fingers on the hand. He is right-handed. He denies fevers/chills, N/V/D, CP, SOB.   Review of Systems:  Per HPI. All other systems reviewed and are negative.    Objective:  Physical Exam: BP 140/98  Pulse 92  Temp(Src) 98 F (36.7 C) (Oral)  Ht 5\' 11"  (1.803 m)  Wt 260 lb (117.935 kg)  BMI 36.28 kg/m2  Gen: 47 y.o. male in NAD HEENT: MMM, EOMI, PERRL CV: RRR, no MRG Resp: Non-labored, CTAB, no wheezes noted Abd: Soft, NTND, BS present, no guarding or organomegaly MSK: R index finger with raised area surrounding transverse laceration approx 1.5cm long overlying DIP. No drainage, wound scabbed over. Distal structures neurovascularly intact. Flexion markedly limited.  Neuro: Alert and oriented, CN II-XII grossly intact, speech normal    Assessment:     Trevor Thomas is a 47 y.o. male with here for follow up on finger laceration.     Plan:     See problem list for problem-specific plans.

## 2013-07-04 ENCOUNTER — Ambulatory Visit
Admission: RE | Admit: 2013-07-04 | Discharge: 2013-07-04 | Disposition: A | Discharge status: Home / Self Care | Payer: Self-pay | Source: Ambulatory Visit | Attending: Family Medicine | Admitting: Family Medicine

## 2013-07-04 DIAGNOSIS — S61219D Laceration without foreign body of unspecified finger without damage to nail, subsequent encounter: Secondary | ICD-10-CM

## 2013-07-07 ENCOUNTER — Telehealth: Payer: Self-pay | Admitting: Family Medicine

## 2013-07-07 DIAGNOSIS — S61209D Unspecified open wound of unspecified finger without damage to nail, subsequent encounter: Secondary | ICD-10-CM

## 2013-07-07 NOTE — Telephone Encounter (Signed)
MRI shows partial laceration of extensor tendon or right middle finger at the PIP. Injury > 78 month old. He will need referral to hand surgery for evaluation and management.   Will you please send the letter along with the note from the previous office visit with the referral? Thank you. - RBG

## 2013-07-08 ENCOUNTER — Other Ambulatory Visit (HOSPITAL_COMMUNITY): Payer: No Typology Code available for payment source

## 2013-07-08 NOTE — Telephone Encounter (Signed)
Spoke with patient's wife and informed her of below. I explained to her that I am unsure of hand surgeons that take the orange card. I can go and try for her, but I cannot guarantee. That he will be able to be seen anytime in the next couple of months. I offered to referred to [facility], but they declined because they.

## 2013-07-09 ENCOUNTER — Telehealth: Payer: Self-pay | Admitting: Family Medicine

## 2013-07-09 NOTE — Telephone Encounter (Signed)
Called and spoke with Mr. Meter. He does not want to be seen by a hand surgeon at this time. Reports improvement in pain/tingling symptoms. I advised that he immobilize the finger for 4-6 weeks with a splint which we could apply in the [facility]. He has a metal splint given to him on discharge from hospital and agrees to wear this. Explained reasons to call back or return to clinic. - RBG

## 2013-07-09 NOTE — Telephone Encounter (Signed)
If he will not be seen by hand surgeon,

## 2013-08-12 ENCOUNTER — Encounter: Payer: Self-pay | Admitting: Family Medicine

## 2013-08-12 ENCOUNTER — Ambulatory Visit (INDEPENDENT_AMBULATORY_CARE_PROVIDER_SITE_OTHER): Payer: No Typology Code available for payment source | Admitting: Family Medicine

## 2013-08-12 VITALS — BP 138/90 | HR 114 | Temp 98.2°F | Ht 71.0 in | Wt 254.9 lb

## 2013-08-12 DIAGNOSIS — I10 Essential (primary) hypertension: Secondary | ICD-10-CM

## 2013-08-12 DIAGNOSIS — M549 Dorsalgia, unspecified: Secondary | ICD-10-CM

## 2013-08-12 DIAGNOSIS — S61219D Laceration without foreign body of unspecified finger without damage to nail, subsequent encounter: Secondary | ICD-10-CM

## 2013-08-12 DIAGNOSIS — J309 Allergic rhinitis, unspecified: Secondary | ICD-10-CM

## 2013-08-12 DIAGNOSIS — G8929 Other chronic pain: Secondary | ICD-10-CM

## 2013-08-12 DIAGNOSIS — Z5189 Encounter for other specified aftercare: Secondary | ICD-10-CM

## 2013-08-12 DIAGNOSIS — J069 Acute upper respiratory infection, unspecified: Secondary | ICD-10-CM

## 2013-08-12 MED ORDER — OXYCODONE-ACETAMINOPHEN 5-325 MG PO TABS: 1.0000 | ORAL_TABLET | Freq: Three times a day (TID) | ORAL | Status: DC | PRN

## 2013-08-12 MED ORDER — LORATADINE 10 MG PO TABS: 10.0000 mg | ORAL_TABLET | Freq: Every morning | ORAL | Status: DC

## 2013-08-12 NOTE — Assessment & Plan Note (Signed)
Likely viral illness based on symptoms and history.  No signs of bacterial illness. Symptomatic treatment for now, see instructions. Return if worsening or no improvement in 1 week.   

## 2013-08-12 NOTE — Assessment & Plan Note (Signed)
Repeat at goal.   No change to meds today.

## 2013-08-12 NOTE — Assessment & Plan Note (Addendum)
Refill for narcotics today.   Need to check UDS next visit -- this has not been done and I did not see this until patient had already left.  Continue home physical therapy.

## 2013-08-12 NOTE — Assessment & Plan Note (Signed)
Declined surgery He is content with current function of finger, knowing this is his new baseline.

## 2013-08-12 NOTE — Progress Notes (Signed)
Subjective:    Trevor Thomas is a 47 y.o. male who presents to [facility] today for several concerns:  1.  URI symptoms:  Present for past 2 days.  Describes rhinorrhea, sinus congestion, mild cough.  Has tried his Clariting which helps but he is now without this. Sick contacts are none.  No fevers or chills. No nausea or vomiting.    2.  Chronic back pain:  Can complete ADLs with narcotic pain medications. Chronic back pain, no acute exacerbations of his pain.  Lumbar, sharp shooting pains and dull aching.  No limping, paresthesias, myalgias.  3. Hypertension:  Long-term problem for this patient.  No adverse effects from medication.  Not checking it regularly.  No HA, CP, dizziness, shortness of breath, palpitations, or LE swelling.   BP Readings from Last 3 Encounters:  08/12/13 149/94  06/28/13 140/98  06/24/13 128/86   Needs refills today for Lisinopril today.  Repeat was at goal.    Prev health:  Currently overdue for flu, but declines b/c of current URI.  The following portions of the patient's history were reviewed and updated as appropriate: allergies, current medications, past medical history, family and social history, and problem list. Patient is a nonsmoker.    PMH reviewed.  Past Medical History  Diagnosis Date  . Hypertension   . COPD (chronic obstructive pulmonary disease)   . Bronchitis     hx of  . Asthma   . Concussion 1983  . Headache(784.0)   . Anxiety   . Depression   . Umbilical hernia   . GERD (gastroesophageal reflux disease)     takes tums/rolaids  . Arthritis   . Head trauma   . 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 Knee surgery    . Thumb surgery (left)    . Umbilical hernia repair  05/15/2012    Procedure: HERNIA REPAIR UMBILICAL ADULT;  Surgeon: Clovis Pu. Cornett, MD;  Location: [facility] OR;  Service: General;  Laterality: N/A;  . Hernia repair  05/2012    umbilical  hernia  . Eye surgery  20 years ago    Metal foreign body removed from Right eye    Medications reviewed. Current Outpatient Prescriptions  Medication Sig Dispense Refill  . albuterol (PROVENTIL HFA;VENTOLIN HFA) 108 (90 BASE) MCG/ACT inhaler Inhale 2 puffs into the lungs every 6 (six) hours as needed for wheezing.      Marland Kitchen lisinopril-hydrochlorothiazide (PRINZIDE,ZESTORETIC) 20-25 MG per tablet Take 1 tablet by mouth every morning.      . loratadine (CLARITIN) 10 MG tablet Take 10 mg by mouth every morning.      Marland Kitchen oxyCODONE-acetaminophen (ROXICET) 5-325 MG per tablet Take 1 tablet by mouth every 8 (eight) hours as needed for pain. Do not fill until 90 days from this date  90 tablet  0   No current facility-administered medications for this visit.    ROS as above otherwise neg.  No chest pain, palpitations, SOB, Fever, Chills, Abd pain, N/V/D.   Objective:   Physical Exam BP 149/94  Pulse 114  Temp(Src) 98.2 F (36.8 C) (Oral)  Ht 5\' 11"  (1.803 m)  Wt 254 lb 14.4 oz (115.622 kg)  BMI 35.57 kg/m2 Gen:  Patient sitting on exam table, appears stated age in no acute distress Head: Normocephalic atraumatic Eyes: EOMI, PERRL, sclera and conjunctiva non-erythematous Ears:  Canals clear bilaterally.  TMs pearly  gray bilaterally without erythema or bulging.   Nose:  Nasal turbinates grossly enlarged bilaterally. Mouth: Mucosa membranes moist. Tonsils +2, nonenlarged, non-erythematous. Neck: No cervical lymphadenopathy noted Heart:  RRR, no murmurs auscultated. Pulm:  Clear to auscultation bilaterally with good air movement.  No wheezes or rales noted.   Back:  TTP BL lumbar region.  No weakness/paresthesias noted BL legs Hand:  Right index finger unable to flex or extend more than about 20 degrees.  No pain or tenderness on my exam.  Neuro:  No focal deficits, normal gait.     No results found for this or any previous visit (from the past 72 hour(s)).

## 2013-08-12 NOTE — Assessment & Plan Note (Signed)
Refill for his claritin today.

## 2013-08-12 NOTE — Patient Instructions (Addendum)
Pain med refills today.  Will do flu shot next time you're here.  Refill for Loratidine today.  Have a good holiday!

## 2013-08-15 ENCOUNTER — Ambulatory Visit: Payer: No Typology Code available for payment source | Admitting: Family Medicine

## 2013-10-03 DIAGNOSIS — Z8719 Personal history of other diseases of the digestive system: Secondary | ICD-10-CM

## 2013-10-03 HISTORY — DX: Personal history of other diseases of the digestive system: Z87.19

## 2013-10-17 ENCOUNTER — Ambulatory Visit: Payer: Self-pay | Admitting: Family Medicine

## 2013-10-21 ENCOUNTER — Ambulatory Visit: Payer: No Typology Code available for payment source

## 2013-10-22 ENCOUNTER — Telehealth: Payer: Self-pay | Admitting: Family Medicine

## 2013-10-22 ENCOUNTER — Ambulatory Visit (INDEPENDENT_AMBULATORY_CARE_PROVIDER_SITE_OTHER): Payer: No Typology Code available for payment source | Admitting: Family Medicine

## 2013-10-22 ENCOUNTER — Encounter: Payer: Self-pay | Admitting: Family Medicine

## 2013-10-22 VITALS — BP 140/84 | Temp 98.8°F | Wt 255.0 lb

## 2013-10-22 DIAGNOSIS — G8929 Other chronic pain: Secondary | ICD-10-CM

## 2013-10-22 DIAGNOSIS — G2581 Restless legs syndrome: Secondary | ICD-10-CM | POA: Insufficient documentation

## 2013-10-22 DIAGNOSIS — I10 Essential (primary) hypertension: Secondary | ICD-10-CM

## 2013-10-22 DIAGNOSIS — F17200 Nicotine dependence, unspecified, uncomplicated: Secondary | ICD-10-CM

## 2013-10-22 DIAGNOSIS — Z720 Tobacco use: Secondary | ICD-10-CM

## 2013-10-22 DIAGNOSIS — M549 Dorsalgia, unspecified: Secondary | ICD-10-CM

## 2013-10-22 LAB — CBC
HEMATOCRIT: 43.8 % (ref 39.0–52.0)
HEMOGLOBIN: 15.4 g/dL (ref 13.0–17.0)
MCH: 30.7 pg (ref 26.0–34.0)
MCHC: 35.2 g/dL (ref 30.0–36.0)
MCV: 87.4 fL (ref 78.0–100.0)
Platelets: 310 10*3/uL (ref 150–400)
RBC: 5.01 MIL/uL (ref 4.22–5.81)
RDW: 13.7 % (ref 11.5–15.5)
WBC: 9.2 10*3/uL (ref 4.0–10.5)

## 2013-10-22 MED ORDER — OXYCODONE-ACETAMINOPHEN 5-325 MG PO TABS: 1.0000 | ORAL_TABLET | Freq: Three times a day (TID) | ORAL | Status: DC | PRN

## 2013-10-22 MED ORDER — OXYCODONE-ACETAMINOPHEN 5-325 MG PO TABS
1.0000 | ORAL_TABLET | Freq: Three times a day (TID) | ORAL | Status: DC | PRN
Start: 2013-10-22 — End: 2014-01-07

## 2013-10-22 MED ORDER — ROPINIROLE HCL 0.25 MG PO TABS: 0.2500 mg | ORAL_TABLET | Freq: Every day | ORAL | Status: DC

## 2013-10-22 NOTE — Progress Notes (Signed)
Subjective:    Trevor Thomas is a 48 y.o. male who presents to [facility] today for several issues  1.  Restless leg syndrome:  States he lays in bed "for hours" keeping and trying to get comfortable.  Has chronic mild knee pain but this feels differently.  Unable to sleep due to symptoms, girlfriend is very agitated and cannot sleep due to him.   2.  Chronic back pain:  Usual back pain.  Limits activity.  No acute worsening.  Better with Percocet which has been taking for years.   3. Hypertension:  Long-term problem for this patient.  No adverse effects from medication.  Not checking it regularly.  No HA, CP, dizziness, shortness of breath, palpitations, or LE swelling.   BP Readings from Last 3 Encounters:  08/12/13 138/90  06/28/13 140/98  06/24/13 128/86   Much better on repeat -- 140/84.     Prev health:  Declines flu shot today.   ROS as above per HPI, otherwise neg.    The following portions of the patient's history were reviewed and updated as appropriate: allergies, current medications, past medical history, family and social history, and problem list. Patient is a nonsmoker.    PMH reviewed.  Past Medical History  Diagnosis Date  . Hypertension   . COPD (chronic obstructive pulmonary disease)   . Bronchitis     hx of  . Asthma   . Concussion 1983  . Headache(784.0)   . Anxiety   . Depression   . Umbilical hernia   . GERD (gastroesophageal reflux disease)     takes tums/rolaids  . Arthritis   . Head trauma   . 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 Knee surgery    . Thumb surgery (left)    . Umbilical hernia repair  05/15/2012    Procedure: HERNIA REPAIR UMBILICAL ADULT;  Surgeon: Clovis Pu. Cornett, MD;  Location: [facility] OR;  Service: General;  Laterality: N/A;  . Hernia repair  05/2012    umbilical hernia  . Eye surgery  20 years ago    Metal foreign body removed from Right eye     Medications reviewed. Current Outpatient Prescriptions  Medication Sig Dispense Refill  . albuterol (PROVENTIL HFA;VENTOLIN HFA) 108 (90 BASE) MCG/ACT inhaler Inhale 2 puffs into the lungs every 6 (six) hours as needed for wheezing.      Marland Kitchen lisinopril-hydrochlorothiazide (PRINZIDE,ZESTORETIC) 20-25 MG per tablet Take 1 tablet by mouth every morning.      . loratadine (CLARITIN) 10 MG tablet Take 1 tablet (10 mg total) by mouth every morning.  30 tablet  11  . oxyCODONE-acetaminophen (ROXICET) 5-325 MG per tablet Take 1 tablet by mouth every 8 (eight) hours as needed. Do not fill until 60 days from this date  90 tablet  0  . oxyCODONE-acetaminophen (ROXICET) 5-325 MG per tablet Take 1 tablet by mouth every 8 (eight) hours as needed for severe pain. Do not fill until 30 days from this date  90 tablet  0  . oxyCODONE-acetaminophen (ROXICET) 5-325 MG per tablet Take 1 tablet by mouth every 8 (eight) hours as needed for severe pain.  90 tablet  0   No current facility-administered medications for this visit.     Objective:   Physical Exam Temp(Src) 98.8 F (37.1 C) (Oral)  Wt 255 lb (115.667 kg) Gen:  Alert, cooperative patient who appears  stated age in no acute distress.  Vital signs reviewed. HEENT: EOMI,  MMM.  Persistent anisocoria noted.  Cardiac:  Regular rate and rhythm  Pulm:  Clear to auscultation bilaterally  Back:  TTP BL LE's.  Minimal spasm noted.  Decreased forward flexion due to pain.  MSK:  BL knees without effusion, joint laxity or tenderness on exam.    No results found for this or any previous visit (from the past 72 hour(s)).

## 2013-10-22 NOTE — Assessment & Plan Note (Signed)
COntinues to cut back.  Down to just a few a day. Recommended to cease entirely.

## 2013-10-22 NOTE — Addendum Note (Signed)
Addended byGwendolyn Grant, Walid Haig H on: 10/22/2013 11:54 AM   Modules accepted: Orders

## 2013-10-22 NOTE — Telephone Encounter (Signed)
Patient would like the Requip sent to [facility] instead of Outpatient [facility]. Please call patient once completed [phone]

## 2013-10-22 NOTE — Assessment & Plan Note (Signed)
Trial of Requip see instructions

## 2013-10-22 NOTE — Assessment & Plan Note (Signed)
Percocet refills provided today.

## 2013-10-22 NOTE — Assessment & Plan Note (Signed)
Better on repeat.  No changes today.

## 2013-10-22 NOTE — Patient Instructions (Addendum)
Medication refills today.  Take the Requip one pill about 2 hours before bed for 3 days.  Then take 2 pills 2 hours before bed for Restless Leg syndrome.    Let me know how this works.  We can keep going up if we need to.

## 2013-10-23 LAB — COMPREHENSIVE METABOLIC PANEL
ALT: 18 U/L (ref 0–53)
AST: 16 U/L (ref 0–37)
Albumin: 4.3 g/dL (ref 3.5–5.2)
Alkaline Phosphatase: 86 U/L (ref 39–117)
BUN: 13 mg/dL (ref 6–23)
CALCIUM: 9.9 mg/dL (ref 8.4–10.5)
CHLORIDE: 100 meq/L (ref 96–112)
CO2: 26 meq/L (ref 19–32)
Creat: 0.74 mg/dL (ref 0.50–1.35)
Glucose, Bld: 91 mg/dL (ref 70–99)
POTASSIUM: 4.3 meq/L (ref 3.5–5.3)
SODIUM: 138 meq/L (ref 135–145)
TOTAL PROTEIN: 6.9 g/dL (ref 6.0–8.3)
Total Bilirubin: 0.3 mg/dL (ref 0.3–1.2)

## 2013-10-23 LAB — TSH: TSH: 1.397 u[IU]/mL (ref 0.350–4.500)

## 2013-10-23 LAB — LDL CHOLESTEROL, DIRECT: LDL DIRECT: 122 mg/dL — AB

## 2013-10-23 MED ORDER — ROPINIROLE HCL 0.25 MG PO TABS: 0.2500 mg | ORAL_TABLET | Freq: Every day | ORAL | Status: DC

## 2013-10-23 NOTE — Telephone Encounter (Signed)
Done will you guys please call?

## 2013-10-23 NOTE — Telephone Encounter (Signed)
Spoke with patient and informed him of below 

## 2013-10-24 ENCOUNTER — Telehealth: Payer: Self-pay | Admitting: Family Medicine

## 2013-10-24 NOTE — Telephone Encounter (Signed)
[name] need to have a cheaper medication for his restless leg syndrome.  Medication prescribed recently is 58.00 and he can't afford it.  Wanted to know if he can obtain one for $4.  Call him back asap.  Going out of town to his mother's due to father being in the hospital

## 2013-10-25 NOTE — Telephone Encounter (Signed)
Best thing would be for him to go to [email or web address].  Type in "Requip" under drug name and zip code.  He can print off coupon for $13 there to use at [facility].  Please call and relay message.

## 2013-10-25 NOTE — Telephone Encounter (Signed)
Spoke to patient and explained instructions.He replied he had no computer and that he's try to have a family member print one out for him. Bettylou Frew, Virgel Bouquet

## 2013-12-30 ENCOUNTER — Telehealth: Payer: Self-pay | Admitting: Family Medicine

## 2013-12-30 MED ORDER — LISINOPRIL-HYDROCHLOROTHIAZIDE 20-25 MG PO TABS: 1.0000 | ORAL_TABLET | Freq: Every morning | ORAL | Status: DC

## 2013-12-30 NOTE — Telephone Encounter (Signed)
Pt called and needs a refill on his BP medication called in to the [facility] on [address] since he is using this one now. jw

## 2014-01-07 ENCOUNTER — Ambulatory Visit (INDEPENDENT_AMBULATORY_CARE_PROVIDER_SITE_OTHER): Payer: No Typology Code available for payment source | Admitting: Family Medicine

## 2014-01-07 DIAGNOSIS — J309 Allergic rhinitis, unspecified: Secondary | ICD-10-CM

## 2014-01-07 DIAGNOSIS — G8929 Other chronic pain: Secondary | ICD-10-CM

## 2014-01-07 DIAGNOSIS — M549 Dorsalgia, unspecified: Secondary | ICD-10-CM

## 2014-01-07 DIAGNOSIS — I10 Essential (primary) hypertension: Secondary | ICD-10-CM

## 2014-01-07 MED ORDER — OXYCODONE-ACETAMINOPHEN 7.5-500 MG PO TABS: 1.0000 | ORAL_TABLET | Freq: Three times a day (TID) | ORAL | Status: DC | PRN

## 2014-01-07 MED ORDER — LORATADINE 10 MG PO TABS: 10.0000 mg | ORAL_TABLET | Freq: Every morning | ORAL | Status: DC

## 2014-01-07 MED ORDER — OXYCODONE-ACETAMINOPHEN 7.5-500 MG PO TABS
1.0000 | ORAL_TABLET | Freq: Three times a day (TID) | ORAL | Status: DC | PRN
Start: 2014-01-07 — End: 2014-01-07

## 2014-01-07 MED ORDER — OXYCODONE-ACETAMINOPHEN 7.5-325 MG PO TABS: 1.0000 | ORAL_TABLET | Freq: Three times a day (TID) | ORAL | Status: DC | PRN

## 2014-01-07 MED ORDER — LISINOPRIL-HYDROCHLOROTHIAZIDE 20-25 MG PO TABS: 1.0000 | ORAL_TABLET | Freq: Every morning | ORAL | Status: DC

## 2014-01-07 NOTE — Assessment & Plan Note (Addendum)
Increased activity and work. Increased to 7.5 mg Percocet x 2 months.  (filled 500 mg Acetaminophen by accident, had to print off 325 afterwards) Recheck after 2 months to see how he's doing.

## 2014-01-07 NOTE — Progress Notes (Signed)
Subjective:    Trevor Thomas is a 48 y.o. male who [facility] for several issues:  1.  Seasonal allergies:  Worse since change in weather.  Worse in AM.  Cleared with Claritin, but has been out of this for past 3 days.    2.  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  States he is in more pain after working.  Taking 2 Percocets some AM, none other days.  Pain mostly in lumbar back and knees.  NO W/D symptoms.    3. Hypertension:  Long-term problem for this patient.  No adverse effects from medication.  Not checking it regularly.  No HA, CP, dizziness, shortness of breath, palpitations, or LE swelling.  Been out of this 3 days as well.  BP Readings from Last 3 Encounters:  10/22/13 140/84  08/12/13 138/90  06/28/13 140/98    Prev health:  Patient is up to date on prev meds thus far.    ROS as above per HPI, otherwise neg.  Pertinently, no chest pain, palpitations, SOB, Fever, Chills, Abd pain, N/V/D.   The following portions of the patient's history were reviewed and updated as appropriate: allergies, current medications, past medical history, family and social history, and problem list. Patient is a nonsmoker.    PMH reviewed.  Past Medical History  Diagnosis Date  . Hypertension   . COPD (chronic obstructive pulmonary disease)   . Bronchitis     hx of  . Asthma   . Concussion 1983  . Headache(784.0)   . Anxiety   . Depression   . Umbilical hernia   . GERD (gastroesophageal reflux disease)     takes tums/rolaids  . Arthritis   . Head trauma   . 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 Knee surgery    . Thumb surgery (left)    . Umbilical hernia repair  05/15/2012    Procedure: HERNIA REPAIR UMBILICAL ADULT;  Surgeon: Clovis Puhomas A. Cornett, MD;  Location: [facility] OR;  Service: General;  Laterality: N/A;  . Hernia repair  05/2012    umbilical hernia  . Eye surgery  20 years ago    Metal foreign body removed from Right eye    Medications reviewed. Current Outpatient Prescriptions  Medication Sig Dispense Refill  . albuterol (PROVENTIL HFA;VENTOLIN HFA) 108 (90 BASE) MCG/ACT inhaler Inhale 2 puffs into the lungs every 6 (six) hours as needed for wheezing.      Marland Kitchen. lisinopril-hydrochlorothiazide (PRINZIDE,ZESTORETIC) 20-25 MG per tablet Take 1 tablet by mouth every morning.  90 tablet  2  . loratadine (CLARITIN) 10 MG tablet Take 1 tablet (10 mg total) by mouth every morning.  30 tablet  11  . oxyCODONE-acetaminophen (ROXICET) 5-325 MG per tablet Take 1 tablet by mouth every 8 (eight) hours as needed. Do not fill until 60 days from this date  90 tablet  0  . oxyCODONE-acetaminophen (ROXICET) 5-325 MG per tablet Take 1 tablet by mouth every 8 (eight) hours as needed for severe pain. Do not fill until 30 days from this date  90 tablet  0  . oxyCODONE-acetaminophen (ROXICET) 5-325 MG per tablet Take 1 tablet by mouth every 8 (eight) hours as needed for severe pain.  90 tablet  0  . rOPINIRole (REQUIP) 0.25 MG tablet Take 1 tablet (0.25 mg total) by mouth at  bedtime.  60 tablet  1   No current facility-administered medications for this visit.     Objective:   Physical Exam There were no vitals taken for this visit. Gen:  Alert, cooperative patient who appears stated age in no acute distress.  Vital signs reviewed. HEENT: EOMI.  Anisocoria (chronic) present on Right.  No conjunctivitis noted.  Nasal turbinates not particularly boggy/edematous. MMM Cardiac:  Regular rate and rhythm  Pulm:  Mild wheezing at bases, clears with cough.  .   Exts: Non edematous BL  LE, warm and well perfused.   No results found for this or any previous visit (from the past 72 hour(s)).

## 2014-01-07 NOTE — Addendum Note (Signed)
Addended byGwendolyn Grant, Newt Lukes on: 01/07/2014 09:28 AM   Modules accepted: Orders

## 2014-01-07 NOTE — Assessment & Plan Note (Signed)
At goal today despite not having meds x 3 days.  Refilled, no changes.

## 2014-01-07 NOTE — Assessment & Plan Note (Signed)
Controlled with Loraditidine. Refilled today

## 2014-01-07 NOTE — Patient Instructions (Signed)
We have increased the dosage of your Percocet.  Take this three times a day.    Let me know how this does.    I have sent in the blood pressure and allergy medicine to [facility].  It was good to see you again today

## 2014-01-09 ENCOUNTER — Telehealth: Payer: Self-pay | Admitting: Family Medicine

## 2014-01-09 NOTE — Telephone Encounter (Signed)
Patient is doing great with Percocet, taking one in the morning, one at noon and one at night. He is having no issues with meds. FYI

## 2014-01-10 NOTE — Telephone Encounter (Signed)
Very good to know, thanks for the update.

## 2014-02-11 ENCOUNTER — Telehealth: Payer: Self-pay | Admitting: Family Medicine

## 2014-02-11 NOTE — Telephone Encounter (Signed)
Pt called and would like Dr. Gwendolyn Grant to call him at [phone]. jw

## 2014-02-11 NOTE — Telephone Encounter (Signed)
Patient states that he was switched up to 7.5-325 from 5-325 he is willing to return the 7.5-325 back to [place]. Patient states that the 5-325 do not make him sweat and feel bad. He would please like to be switched down a dose with no questions asked.

## 2014-02-12 NOTE — Telephone Encounter (Signed)
That's fine.  Is he asking for a new prescription for the 5/325?  Needs to return the other script

## 2014-02-13 NOTE — Telephone Encounter (Signed)
Yes he is taking about that. And just let me know when it is ready he is more than willing when I talked to him abou tbringing other rx back

## 2014-02-14 MED ORDER — OXYCODONE-ACETAMINOPHEN 5-325 MG PO TABS: 1.0000 | ORAL_TABLET | Freq: Three times a day (TID) | ORAL | Status: DC | PRN

## 2014-02-14 NOTE — Telephone Encounter (Signed)
rx's have been handed off and old ones have been discarded of.Trevor Thomas

## 2014-02-14 NOTE — Telephone Encounter (Signed)
Printed and given to [employer].

## 2014-02-17 ENCOUNTER — Telehealth: Payer: Self-pay | Admitting: *Deleted

## 2014-02-17 NOTE — Telephone Encounter (Signed)
Received call from [facility] wanting to speak with Dr. Gwendolyn Grant about a pt matter.  Clovis Pu, RN

## 2014-02-19 NOTE — Telephone Encounter (Signed)
Marisue Ivan called to clarify new dosing of Percocet. Confirmed that Davies is now back down on 5 mg Percocet and he turned in his 7.5 mg prescriptions.

## 2014-03-13 ENCOUNTER — Emergency Department (HOSPITAL_COMMUNITY): Payer: Self-pay

## 2014-03-13 ENCOUNTER — Encounter (HOSPITAL_COMMUNITY): Payer: Self-pay | Admitting: Emergency Medicine

## 2014-03-13 ENCOUNTER — Emergency Department (HOSPITAL_COMMUNITY)
Admission: EM | Admit: 2014-03-13 | Discharge: 2014-03-13 | Disposition: A | Discharge status: Home / Self Care | Payer: Self-pay | Attending: Emergency Medicine | Admitting: Emergency Medicine

## 2014-03-13 DIAGNOSIS — Z8739 Personal history of other diseases of the musculoskeletal system and connective tissue: Secondary | ICD-10-CM | POA: Insufficient documentation

## 2014-03-13 DIAGNOSIS — F3289 Other specified depressive episodes: Secondary | ICD-10-CM | POA: Insufficient documentation

## 2014-03-13 DIAGNOSIS — J45901 Unspecified asthma with (acute) exacerbation: Secondary | ICD-10-CM | POA: Insufficient documentation

## 2014-03-13 DIAGNOSIS — T6391XA Toxic effect of contact with unspecified venomous animal, accidental (unintentional), initial encounter: Secondary | ICD-10-CM | POA: Insufficient documentation

## 2014-03-13 DIAGNOSIS — F329 Major depressive disorder, single episode, unspecified: Secondary | ICD-10-CM | POA: Insufficient documentation

## 2014-03-13 DIAGNOSIS — R141 Gas pain: Secondary | ICD-10-CM | POA: Insufficient documentation

## 2014-03-13 DIAGNOSIS — Z8669 Personal history of other diseases of the nervous system and sense organs: Secondary | ICD-10-CM | POA: Insufficient documentation

## 2014-03-13 DIAGNOSIS — K219 Gastro-esophageal reflux disease without esophagitis: Secondary | ICD-10-CM | POA: Insufficient documentation

## 2014-03-13 DIAGNOSIS — R142 Eructation: Secondary | ICD-10-CM

## 2014-03-13 DIAGNOSIS — R109 Unspecified abdominal pain: Secondary | ICD-10-CM | POA: Insufficient documentation

## 2014-03-13 DIAGNOSIS — T63441A Toxic effect of venom of bees, accidental (unintentional), initial encounter: Secondary | ICD-10-CM

## 2014-03-13 DIAGNOSIS — Z87828 Personal history of other (healed) physical injury and trauma: Secondary | ICD-10-CM | POA: Insufficient documentation

## 2014-03-13 DIAGNOSIS — R51 Headache: Secondary | ICD-10-CM | POA: Insufficient documentation

## 2014-03-13 DIAGNOSIS — F17200 Nicotine dependence, unspecified, uncomplicated: Secondary | ICD-10-CM | POA: Insufficient documentation

## 2014-03-13 DIAGNOSIS — Z79899 Other long term (current) drug therapy: Secondary | ICD-10-CM | POA: Insufficient documentation

## 2014-03-13 DIAGNOSIS — F411 Generalized anxiety disorder: Secondary | ICD-10-CM | POA: Insufficient documentation

## 2014-03-13 DIAGNOSIS — T63461A Toxic effect of venom of wasps, accidental (unintentional), initial encounter: Secondary | ICD-10-CM | POA: Insufficient documentation

## 2014-03-13 DIAGNOSIS — R0789 Other chest pain: Secondary | ICD-10-CM | POA: Insufficient documentation

## 2014-03-13 DIAGNOSIS — Y929 Unspecified place or not applicable: Secondary | ICD-10-CM | POA: Insufficient documentation

## 2014-03-13 DIAGNOSIS — Y9389 Activity, other specified: Secondary | ICD-10-CM | POA: Insufficient documentation

## 2014-03-13 DIAGNOSIS — I10 Essential (primary) hypertension: Secondary | ICD-10-CM | POA: Insufficient documentation

## 2014-03-13 DIAGNOSIS — J441 Chronic obstructive pulmonary disease with (acute) exacerbation: Secondary | ICD-10-CM | POA: Insufficient documentation

## 2014-03-13 DIAGNOSIS — R112 Nausea with vomiting, unspecified: Secondary | ICD-10-CM | POA: Insufficient documentation

## 2014-03-13 DIAGNOSIS — R143 Flatulence: Secondary | ICD-10-CM

## 2014-03-13 LAB — CBC WITH DIFFERENTIAL/PLATELET
BASOS ABS: 0 10*3/uL (ref 0.0–0.1)
Basophils Relative: 0 % (ref 0–1)
Eosinophils Absolute: 0.2 10*3/uL (ref 0.0–0.7)
Eosinophils Relative: 2 % (ref 0–5)
HCT: 45.1 % (ref 39.0–52.0)
Hemoglobin: 15.8 g/dL (ref 13.0–17.0)
LYMPHS ABS: 3.6 10*3/uL (ref 0.7–4.0)
Lymphocytes Relative: 36 % (ref 12–46)
MCH: 31 pg (ref 26.0–34.0)
MCHC: 35 g/dL (ref 30.0–36.0)
MCV: 88.6 fL (ref 78.0–100.0)
Monocytes Absolute: 0.7 10*3/uL (ref 0.1–1.0)
Monocytes Relative: 7 % (ref 3–12)
NEUTROS PCT: 55 % (ref 43–77)
Neutro Abs: 5.7 10*3/uL (ref 1.7–7.7)
PLATELETS: 250 10*3/uL (ref 150–400)
RBC: 5.09 MIL/uL (ref 4.22–5.81)
RDW: 12.1 % (ref 11.5–15.5)
WBC: 10.2 10*3/uL (ref 4.0–10.5)

## 2014-03-13 LAB — LIPASE, BLOOD: LIPASE: 19 U/L (ref 11–59)

## 2014-03-13 LAB — I-STAT TROPONIN, ED: Troponin i, poc: 0.01 ng/mL (ref 0.00–0.08)

## 2014-03-13 LAB — BASIC METABOLIC PANEL
BUN: 15 mg/dL (ref 6–23)
CO2: 26 meq/L (ref 19–32)
Calcium: 9.6 mg/dL (ref 8.4–10.5)
Chloride: 99 mEq/L (ref 96–112)
Creatinine, Ser: 0.67 mg/dL (ref 0.50–1.35)
GFR calc Af Amer: >90 mL/min (ref >90)
GLUCOSE: 110 mg/dL — AB (ref 70–99)
POTASSIUM: 4.1 meq/L (ref 3.7–5.3)
SODIUM: 137 meq/L (ref 137–147)

## 2014-03-13 MED ORDER — METHYLPREDNISOLONE SODIUM SUCC 125 MG IJ SOLR
125.0000 mg | Freq: Once | INTRAMUSCULAR | Status: AC
  Administered 2014-03-13: 125 mg via INTRAMUSCULAR
  Filled 2014-03-13: qty 2

## 2014-03-13 MED ORDER — DIPHENHYDRAMINE HCL 25 MG PO CAPS
25.0000 mg | ORAL_CAPSULE | Freq: Once | ORAL | Status: AC
  Administered 2014-03-13: 25 mg via ORAL
  Filled 2014-03-13: qty 1

## 2014-03-13 MED ORDER — DIPHENHYDRAMINE HCL 25 MG PO TABS: 25.0000 mg | ORAL_TABLET | Freq: Four times a day (QID) | ORAL | Status: DC

## 2014-03-13 MED ORDER — FAMOTIDINE 20 MG PO TABS
20.0000 mg | ORAL_TABLET | Freq: Once | ORAL | Status: AC
Start: 2014-03-13 — End: 2014-03-13
  Administered 2014-03-13: 20 mg via ORAL
  Filled 2014-03-13: qty 1

## 2014-03-13 MED ORDER — PREDNISONE (PAK) 10 MG PO TABS: ORAL_TABLET | ORAL | Status: DC

## 2014-03-13 MED ORDER — EPINEPHRINE 0.3 MG/0.3ML IJ SOAJ: 0.3000 mg | Freq: Once | INTRAMUSCULAR | Status: DC

## 2014-03-13 NOTE — ED Notes (Addendum)
Pt reports shortness of breath after bee sting on r/foot at 1130. Swelling on r/1st toe noted. Pt alert, oriented and  conversive

## 2014-03-13 NOTE — ED Notes (Signed)
Dr. Walden at bedside 

## 2014-03-13 NOTE — ED Provider Notes (Signed)
MSE was initiated and I personally evaluated the patient and placed orders (if any) at  2:51 PM on March 13, 2014.  48 y.o. M here acutely for bee sting to sole of right foot around 1130 this morning as he was watering plants.  States for the past 2 days he has been having exertional chest pain and SOB.  This morning he had some residual chest pain with nausea and non-bloody, non-bilious emesis x2. Pt was planning to come to ED anyway for evaluation, but came sooner due to bee sting.  Pt is a daily smoker, hx of HTN.  Family cardiac hx.  On arrival, pt is currently speaking in full complete sentences without difficulty, airway is patent, and he has no respiratory distress.  Swelling of right foot minimal at this time, foot remains NVI.  Pt medicated with solu-medrol, benadryl, and pepcid to bee sting.  He will be moved to acute bed for further evaluation of his exertional chest pain.  EKG, CXR, labs pending at this time.  The patient appears stable so that the remainder of the MSE may be completed by another provider.   Garlon Hatchet, PA-C 03/13/14 1457  Garlon Hatchet, PA-C 03/13/14 1458

## 2014-03-13 NOTE — Progress Notes (Signed)
[facility] CL spoke with patient about community resources. Patient stated that he had the [facility] Card with [facility] (Dr. Gwendolyn Grant). Explained to patient about [facility] [employer]. CL offered to scheduled patient a f/u apt with pcp, but patient declined stating that he called pcp and was told that dr was out of office until 6/23rd, patient stated that he has an apt around that time anyways. CL cannot see in system where patient has a f/u apt with pcp.

## 2014-03-13 NOTE — ED Notes (Signed)
Decreased pain and swelling on r/foot. Pt ambulating in room

## 2014-03-13 NOTE — ED Provider Notes (Signed)
CSN: [id number]     Arrival date & time 03/13/14  1349 History   First MD Initiated Contact with Patient 03/13/14 1518     Chief Complaint  Patient presents with  . Insect Bite  . Allergic Reaction  . Chest Pain     (Consider location/radiation/quality/duration/timing/severity/associated sxs/prior Treatment) Patient is a 48 y.o. male presenting with allergic reaction and chest pain. The history is provided by the patient. No language interpreter was used.  Allergic Reaction Presenting symptoms: itching and swelling   Severity:  Mild Prior allergic episodes:  Insect allergies Context: insect bite/sting   Chest Pain Pain location:  L chest Pain quality: shooting   Radiates to: from LUQ to left chest wall. Pain radiates to the back: no   Pain severity:  Moderate Onset quality:  Gradual Duration:  2 days Timing:  Intermittent Progression:  Waxing and waning Chronicity:  New Context: breathing   Relieved by:  None tried Worsened by:  Deep breathing and exertion Ineffective treatments:  Rest Associated symptoms: abdominal pain, fatigue, headache, nausea, shortness of breath and vomiting   Risk factors: hypertension, male sex and smoking     Past Medical History  Diagnosis Date  . Hypertension   . COPD (chronic obstructive pulmonary disease)   . Bronchitis     hx of  . Asthma   . Concussion 1983  . Headache(784.0)   . Anxiety   . Depression   . Umbilical hernia   . GERD (gastroesophageal reflux disease)     takes tums/rolaids  . Arthritis   . Head trauma   . 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 Knee surgery    . Thumb surgery (left)    . Umbilical hernia repair  05/15/2012    Procedure: HERNIA REPAIR UMBILICAL ADULT;  Surgeon: Clovis Puhomas A. Cornett, MD;  Location: [facility] OR;  Service: General;  Laterality: N/A;  . Hernia repair  05/2012    umbilical hernia  . Eye surgery  20 years  ago    Metal foreign body removed from Right eye   Family History  Problem Relation Age of Onset  . Diabetes Mother   . Diabetes Father   . Heart failure Brother    History  Substance Use Topics  . Smoking status: Current Every Day Smoker -- 0.75 packs/day for 20 years    Types: Cigarettes  . Smokeless tobacco: Never Used  . Alcohol Use: No     Comment: rarely  - quit approx. June 2013    Review of Systems  Constitutional: Positive for fatigue.  Respiratory: Positive for shortness of breath.   Cardiovascular: Positive for chest pain.  Gastrointestinal: Positive for nausea, vomiting, abdominal pain and abdominal distention.  Musculoskeletal: Positive for arthralgias.  Skin: Positive for itching.  Allergic/Immunologic: Positive for environmental allergies.  Neurological: Positive for headaches.  All other systems reviewed and are negative.     Allergies  Review of patient's allergies indicates no known allergies.  Home Medications   Prior to Admission medications   Medication Sig Start Date End Date Taking? Authorizing Provider  albuterol (PROVENTIL HFA;VENTOLIN HFA) 108 (90 BASE) MCG/ACT inhaler Inhale 2 puffs into the lungs every 6 (six) hours as needed for wheezing.    Historical Provider, MD  lisinopril-hydrochlorothiazide (PRINZIDE,ZESTORETIC) 20-25 MG per tablet Take 1 tablet by mouth every morning. 01/07/14   Tobey GrimJeffrey H Walden, MD  loratadine [facility](CLARITIN)  10 MG tablet Take 1 tablet (10 mg total) by mouth every morning. 01/07/14   Tobey Grim, MD  oxyCODONE-acetaminophen (ROXICET) 5-325 MG per tablet Take 1 tablet by mouth every 8 (eight) hours as needed for severe pain. 02/14/14   Tobey Grim, MD  oxyCODONE-acetaminophen (ROXICET) 5-325 MG per tablet Take 1 tablet by mouth every 8 (eight) hours as needed for severe pain. Do not fill until 30 days from above date 02/14/14   Tobey Grim, MD  rOPINIRole (REQUIP) 0.25 MG tablet Take 1 tablet (0.25 mg total) by mouth  at bedtime. 10/23/13   Tobey Grim, MD   BP 130/76  Pulse 102  Temp(Src) 98 F (36.7 C) (Oral)  Wt 255 lb (115.667 kg)  SpO2 99% Physical Exam  Nursing note and vitals reviewed. Constitutional: He is oriented to person, place, and time. He appears well-developed and well-nourished.  HENT:  Head: Normocephalic and atraumatic.  Mouth/Throat: Oropharynx is clear and moist.  Eyes: Conjunctivae are normal. Pupils are equal, round, and reactive to light.  Neck: Normal range of motion.  Cardiovascular: Normal rate, regular rhythm and intact distal pulses.   Pulmonary/Chest: Effort normal and breath sounds normal.  Abdominal: Bowel sounds are normal. He exhibits distension.  Musculoskeletal: He exhibits tenderness.  Lymphadenopathy:    He has no cervical adenopathy.  Neurological: He is alert and oriented to person, place, and time.  Skin: Skin is warm and dry. No rash noted. No erythema.  Psychiatric: He has a normal mood and affect.    ED Course  Procedures (including critical care time) Labs Review Labs Reviewed  CBC WITH DIFFERENTIAL  BASIC METABOLIC PANEL  I-STAT TROPOININ, ED    Imaging Review Dg Chest 2 View  03/13/2014   CLINICAL DATA:  Left-sided chest pain, shortness of breath, nausea and vomiting were for 2 days ; be seen today.  EXAM: CHEST  2 VIEW  COMPARISON:  PA and lateral chest of November 18, 2012  FINDINGS: The lungs are adequately inflated. The interstitial markings are coarse bilaterally but are slightly less conspicuous than on the previous study. The cardiac silhouette is top-normal in size. The pulmonary vascularity is not engorged. There is no pleural effusion. The bony thorax is unremarkable.  IMPRESSION: There is no acute cardiopulmonary abnormality.   Electronically Signed   By: Jazion Atteberry  [place]   On: 03/13/2014 15:13     EKG Interpretation None     48 year old male with multiple medical concerns presents to ED today for evaluation of bee sting to  bottom of right foot earlier today, associated with mild shortness of breath, pruritis of neck, and mild edema to right foot.  Patient relates history of prior allergic reaction requiring epinephrine administration.  Patient treated upon arrival with solu-medrol, benadryl, and pepcid with relief of symptoms.  Patient also reports several days of left sided chest pain, seemingly originating in LUQ, associated with exertional dyspnea, nausea, vomiting of yellow emesis.  Patient also reporting abdominal bloating, that worsens with consumption of food.  Lab and radiology results reviewed and shared with patient, are reassuring. Patient feels better after treatment. Patient discussed with and seen by Dr. Gwendolyn Grant.  Discharged home with prescription for prednisone, benadryl, epi-pen.  Follow-up with PCP.  Return precautions provided. MDM   Final diagnoses:  None    Allergic reaction.    Jimmye Norman, NP 03/13/14 1905

## 2014-03-13 NOTE — ED Provider Notes (Signed)
Medical screening examination/treatment/procedure(s) were performed by non-physician practitioner and as supervising physician I was immediately available for consultation/collaboration.   EKG Interpretation None       Ethelda Chick, MD 03/13/14 1909

## 2014-03-13 NOTE — Discharge Instructions (Signed)
Chest Pain (Nonspecific) °It is often hard to give a specific diagnosis for the cause of chest pain. There is always a chance that your pain could be related to something serious, such as a heart attack or a blood clot in the lungs. You need to follow up with your caregiver for further evaluation. °CAUSES  °· Heartburn. °· Pneumonia or bronchitis. °· Anxiety or stress. °· Inflammation around your heart (pericarditis) or lung (pleuritis or pleurisy). °· A blood clot in the lung. °· A collapsed lung (pneumothorax). It can develop suddenly on its own (spontaneous pneumothorax) or from injury (trauma) to the chest. °· Shingles infection (herpes zoster virus). °The chest wall is composed of bones, muscles, and cartilage. Any of these can be the source of the pain. °· The bones can be bruised by injury. °· The muscles or cartilage can be strained by coughing or overwork. °· The cartilage can be affected by inflammation and become sore (costochondritis). °DIAGNOSIS  °Lab tests or other studies, such as X-rays, electrocardiography, stress testing, or cardiac imaging, may be needed to find the cause of your pain.  °TREATMENT  °· Treatment depends on what may be causing your chest pain. Treatment may include: °· Acid blockers for heartburn. °· Anti-inflammatory medicine. °· Pain medicine for inflammatory conditions. °· Antibiotics if an infection is present. °· You may be advised to change lifestyle habits. This includes stopping smoking and avoiding alcohol, caffeine, and chocolate. °· You may be advised to keep your head raised (elevated) when sleeping. This reduces the chance of acid going backward from your stomach into your esophagus. °· Most of the time, nonspecific chest pain will improve within 2 to 3 days with rest and mild pain medicine. °HOME CARE INSTRUCTIONS  °· If antibiotics were prescribed, take your antibiotics as directed. Finish them even if you start to feel better. °· For the next few days, avoid physical  activities that bring on chest pain. Continue physical activities as directed. °· Do not smoke. °· Avoid drinking alcohol. °· Only take over-the-counter or prescription medicine for pain, discomfort, or fever as directed by your caregiver. °· Follow your caregiver's suggestions for further testing if your chest pain does not go away. °· Keep any follow-up appointments you made. If you do not go to an appointment, you could develop lasting (chronic) problems with pain. If there is any problem keeping an appointment, you must call to reschedule. °SEEK MEDICAL CARE IF:  °· You think you are having problems from the medicine you are taking. Read your medicine instructions carefully. °· Your chest pain does not go away, even after treatment. °· You develop a rash with blisters on your chest. °SEEK IMMEDIATE MEDICAL CARE IF:  °· You have increased chest pain or pain that spreads to your arm, neck, jaw, back, or abdomen. °· You develop shortness of breath, an increasing cough, or you are coughing up blood. °· You have severe back or abdominal pain, feel nauseous, or vomit. °· You develop severe weakness, fainting, or chills. °· You have a fever. °THIS IS AN EMERGENCY. Do not wait to see if the pain will go away. Get medical help at once. Call your local emergency services (911 in [place] Do not drive yourself to the hospital. °MAKE SURE YOU:  °· Understand these instructions. °· Will watch your condition. °· Will get help right away if you are not doing well or get worse. °Document Released: 06/29/2005 Document Revised: 12/12/2011 Document Reviewed: 04/24/2008 °ExitCare® Patient Information ©2014 [employer],   LL[employer]. Anaphylactic Reaction An anaphylactic reaction is a sudden, severe allergic reaction. It affects the whole body. It can be life threatening. You may need to stay in the hospital.  Outlook a medical bracelet or necklace that lists your allergy.  Carry your allergy kit or medicine shot to treat severe  allergic reactions with you. These can save your life.  Do not drive until medicine from your shot has worn off, unless your doctor says it is okay.  If you have hives or a rash:  Take medicine as told by your doctor.  You may take over-the-counter antihistamine medicine.  Place cold cloths on your skin. Take baths in cool water. Avoid hot baths and hot showers. GET HELP RIGHT AWAY IF:   Your mouth is puffy (swollen), or you have trouble breathing.  You start making whistling sounds when you breathe (wheezing).  You have a tight feeling in your chest or throat.  You have a rash, hives, puffiness, or itching on your body.  You throw up (vomit) or have watery poop (diarrhea).  You feel dizzy or pass out (faint).  You think you are having an allergic reaction.  You have new symptoms. This is an emergency. Use your medicine shot or allergy kit as told. Call your local emergency services (911 in [place] Even if you feel better after the shot, you need to go to the hospital emergency department. MAKE SURE YOU:   Understand these instructions.  Will watch your condition.  Will get help right away if you are not doing well or get worse. Document Released: 03/07/2008 Document Revised: 03/20/2012 Document Reviewed: 12/21/2011 [facility] Patient Information 2014 [place].

## 2014-03-14 NOTE — ED Provider Notes (Signed)
Medical screening examination/treatment/procedure(s) were performed by non-physician practitioner and as supervising physician I was immediately available for consultation/collaboration.   Carless Slatten J. Madex Seals, MD 03/14/14 1548 

## 2014-03-26 ENCOUNTER — Telehealth: Payer: Self-pay | Admitting: Family Medicine

## 2014-03-26 NOTE — Telephone Encounter (Signed)
Spoke to patient,he's requesting to speak with Dr Gwendolyn Grant because he's needing more oxycodone.he under stands it was filled on the 15 of this month but states he had to take 3 at a time and still had pain.Insist on talking to Dr Gwendolyn Grant only .please advise.Proposito, Virgel Bouquet

## 2014-03-26 NOTE — Telephone Encounter (Signed)
Wants to talk to Dr Westly Pam nurse so he can discuss " the issues he is having"

## 2014-03-27 MED ORDER — DICLOFENAC SODIUM 75 MG PO TBEC: 75.0000 mg | DELAYED_RELEASE_TABLET | Freq: Two times a day (BID) | ORAL | Status: DC

## 2014-03-27 MED ORDER — LORAZEPAM 0.5 MG PO TABS: 0.5000 mg | ORAL_TABLET | Freq: Two times a day (BID) | ORAL | Status: DC | PRN

## 2014-03-27 MED ORDER — CITALOPRAM HYDROBROMIDE 20 MG PO TABS: 20.0000 mg | ORAL_TABLET | Freq: Every day | ORAL | Status: DC

## 2014-03-27 NOTE — Telephone Encounter (Signed)
Pt called again.  He is still wanting to talk to dr Gwendolyn Grant. Is having some MAJOR issues and he needs to talk to dr Please call

## 2014-03-27 NOTE — Telephone Encounter (Signed)
Called and spoke with patient.  Back pain and anxiety are reason for him taking extra of his Oxycodone.  Informed him that I will not refill this.  Send in Ativan for anxiety and W/D symptoms, plus celexa.  Also send in Diclofenac for pain relief.  He expressed understanding.

## 2014-03-27 NOTE — Telephone Encounter (Signed)
Spoke to Dr Gwendolyn Grant and he states he will call patient.Proposito, Virgel Bouquet

## 2014-04-08 ENCOUNTER — Ambulatory Visit (INDEPENDENT_AMBULATORY_CARE_PROVIDER_SITE_OTHER): Payer: No Typology Code available for payment source | Admitting: Family Medicine

## 2014-04-08 ENCOUNTER — Encounter: Payer: Self-pay | Admitting: Family Medicine

## 2014-04-08 VITALS — BP 129/83 | HR 94 | Temp 98.7°F | Ht 71.0 in | Wt 246.0 lb

## 2014-04-08 DIAGNOSIS — F418 Other specified anxiety disorders: Secondary | ICD-10-CM

## 2014-04-08 DIAGNOSIS — F341 Dysthymic disorder: Secondary | ICD-10-CM

## 2014-04-08 DIAGNOSIS — M549 Dorsalgia, unspecified: Secondary | ICD-10-CM

## 2014-04-08 DIAGNOSIS — J309 Allergic rhinitis, unspecified: Secondary | ICD-10-CM

## 2014-04-08 DIAGNOSIS — G8929 Other chronic pain: Secondary | ICD-10-CM

## 2014-04-08 DIAGNOSIS — Z91030 Bee allergy status: Secondary | ICD-10-CM

## 2014-04-08 DIAGNOSIS — Z91038 Other insect allergy status: Secondary | ICD-10-CM

## 2014-04-08 DIAGNOSIS — I10 Essential (primary) hypertension: Secondary | ICD-10-CM

## 2014-04-08 MED ORDER — OXYCODONE-ACETAMINOPHEN 5-325 MG PO TABS: 1.0000 | ORAL_TABLET | Freq: Three times a day (TID) | ORAL | Status: DC | PRN

## 2014-04-08 MED ORDER — DICLOFENAC SODIUM 75 MG PO TBEC: 75.0000 mg | DELAYED_RELEASE_TABLET | Freq: Two times a day (BID) | ORAL | Status: DC

## 2014-04-08 MED ORDER — LISINOPRIL-HYDROCHLOROTHIAZIDE 20-25 MG PO TABS: 1.0000 | ORAL_TABLET | Freq: Every morning | ORAL | Status: DC

## 2014-04-08 MED ORDER — LORATADINE 10 MG PO TABS
10.0000 mg | ORAL_TABLET | Freq: Every morning | ORAL | Status: DC
Start: 2014-04-08 — End: 2015-01-14

## 2014-04-08 NOTE — Assessment & Plan Note (Signed)
Refill Claritin today.  Controlled, no need for Flonase

## 2014-04-08 NOTE — Assessment & Plan Note (Signed)
Has script for Epi-pen -- but states he cannot afford it. Discussed this with him, risks of anaphylaxis after sting. Recommended to fill script.

## 2014-04-08 NOTE — Assessment & Plan Note (Signed)
At goal.  Refill Lisinopril today

## 2014-04-08 NOTE — Assessment & Plan Note (Signed)
Improvement with Diclofenac. Roxicet helps complete his ADLs No refills provided if he runs out of his medications earlier.   Back to 5 mg dosing.

## 2014-04-08 NOTE — Progress Notes (Signed)
Subjective:    Trevor Thomas is a 48 y.o. male who presents to [facility] today for chronic issues and bee-sting:  1.  Bee-sting:  Stung by a bee about 2 weeks.  Seen by EMS, given Benadryl x 2.  Did not give Epi-pen.  He refused to be seen at the hospital.  States he did feel some throat swelling after bee-sting.    2.  Anxiety:  Being investigated for past medical criminal records, states occurred years ago.  Started on Celexa due to his anxiety.  States this has greatly helped.  No longer easily angered.  States that his mother and family have been greatly impressed by change and that their relationships have greatly improved.  Caring for 96 year neighbor along with his fiancee.    3.  Back pain:  Chronic.  Has been out of his Oxycodone because he overused this.  States Diclofenac helped somewhat but not as much as the Oxycodone.  Pain mostly in lower back.  Mother has come to town and "set him straight" as to not over-using his medicine.    4.  Seasonal allergies:  Out of Claritin.  States that his allergic rhinitis is flaring since he's been out.    5. Hypertension:  Long-term problem for this patient.  No adverse effects from medication.  Not checking it regularly.  No HA, CP, dizziness, shortness of breath, palpitations, or LE swelling.  Out of Lisinopril tonight.   BP Readings from Last 3 Encounters:  04/08/14 129/83  03/13/14 119/80  10/22/13 140/84    ROS as above per HPI, otherwise neg.  The following portions of the patient's history were reviewed and updated as appropriate: allergies, current medications, past medical history, family and social history, and problem list. Patient is a nonsmoker.    PMH reviewed.  Past Medical History  Diagnosis Date  . Hypertension   . COPD (chronic obstructive pulmonary disease)   . Bronchitis     hx of  . Asthma   . Concussion 1983  . Headache(784.0)   . Anxiety   . Depression   . Umbilical hernia   . GERD (gastroesophageal reflux disease)      takes tums/rolaids  . Arthritis   . Head trauma   . 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 Knee surgery    . Thumb surgery (left)    . Umbilical hernia repair  05/15/2012    Procedure: HERNIA REPAIR UMBILICAL ADULT;  Surgeon: Clovis Pu. Cornett, MD;  Location: [facility] OR;  Service: General;  Laterality: N/A;  . Hernia repair  05/2012    umbilical hernia  . Eye surgery  20 years ago    Metal foreign body removed from Right eye    Medications reviewed. Current Outpatient Prescriptions  Medication Sig Dispense Refill  . albuterol (PROVENTIL HFA;VENTOLIN HFA) 108 (90 BASE) MCG/ACT inhaler Inhale 2 puffs into the lungs every 6 (six) hours as needed for wheezing.      . citalopram (CELEXA) 20 MG tablet Take 1 tablet (20 mg total) by mouth daily.  30 tablet  3  . diclofenac (VOLTAREN) 75 MG EC tablet Take 1 tablet (75 mg total) by mouth 2 (two) times daily.  30 tablet  0  . diphenhydrAMINE (BENADRYL) 25 MG tablet Take 1 tablet (25 mg total) by mouth every 6 (six) hours.  20 tablet  0  . EPINEPHrine  0.3 mg/0.3 mL IJ SOAJ injection Inject 0.3 mLs (0.3 mg total) into the muscle once.  1 Device  0  . lisinopril-hydrochlorothiazide (PRINZIDE,ZESTORETIC) 20-25 MG per tablet Take 1 tablet by mouth every morning.  90 tablet  2  . loratadine (CLARITIN) 10 MG tablet Take 1 tablet (10 mg total) by mouth every morning.  30 tablet  11  . LORazepam (ATIVAN) 0.5 MG tablet Take 1 tablet (0.5 mg total) by mouth 2 (two) times daily as needed for anxiety.  45 tablet  0  . oxyCODONE-acetaminophen (ROXICET) 5-325 MG per tablet Take 1 tablet by mouth every 8 (eight) hours as needed for severe pain. Do not fill until 30 days from above date  90 tablet  0  . predniSONE (STERAPRED UNI-PAK) 10 MG tablet Take per package instructions.  21 tablet  0   No current facility-administered medications for this visit.     Objective:    Physical Exam BP 129/83  Pulse 94  Temp(Src) 98.7 F (37.1 C) (Oral)  Ht 5\' 11"  (1.803 m)  Wt 246 lb (111.585 kg)  BMI 34.33 kg/m2 Gen:  Alert, cooperative patient who appears stated age in no acute distress.  Vital signs reviewed. HEENT: EOMI,  MMM.  Upper dentures.   Cardiac:  Regular rate and rhythm without murmur auscultated.  Good S1/S2. Pulm:  Clear to auscultation bilaterally   Skin:  No evidence of urticaria or lesions today.  Back:  Normal skin, Spine with normal alignment and no deformity.  No tenderness to vertebral process palpation.  Paraspinous muscles are not tender and without spasm.   Range of motion is full at neck and lumbar sacral regions.  Neuro:  Sensation and motor function 5/5 bilateral lower extremities.  Patellar DTRs diminished.  He is able to walk on his heels and toes without difficulty.    No results found for this or any previous visit (from the past 72 hour(s)).

## 2014-04-08 NOTE — Patient Instructions (Signed)
Refill for your medicines today.  If you're stung by a bee, make sure you get seen to have the Epi shot!  It was good to see you again today!

## 2014-04-08 NOTE — Assessment & Plan Note (Signed)
Greatly improved with addition of Celexa. Mother helps keep him calm with breathing exercises as well. Declines counseling today. Never filled Ativan -- checked Controlled Substance Reporting System showing he never filled this.  Will remove from med list.

## 2014-04-25 ENCOUNTER — Telehealth: Payer: Self-pay | Admitting: Family Medicine

## 2014-04-25 NOTE — Telephone Encounter (Signed)
Patient denies any suicidal thought at that point he was advised to schedule an office visit to discuss changing medication or increasing current medication,patient voiced understanding.Trevor Thomas, Virgel Bouquet

## 2014-04-25 NOTE — Telephone Encounter (Signed)
Pt called because the Celexa that he is taking is not working like it was in the beginning and he wanted to know does he need to take something different or increase the dosage. Please call him at [phone]. jw

## 2014-04-28 ENCOUNTER — Ambulatory Visit: Payer: Self-pay | Admitting: Family Medicine

## 2014-05-14 ENCOUNTER — Ambulatory Visit: Payer: Self-pay

## 2014-05-16 ENCOUNTER — Telehealth: Payer: Self-pay | Admitting: Family Medicine

## 2014-05-16 NOTE — Telephone Encounter (Signed)
Pt called and only wanted Dr. Gwendolyn Grant to read this message. He had issues the last time he called with the nurses. Please call Trevor Thomas he is having some serious issues and needs to talk to Dr. Gwendolyn Grant. jw

## 2014-05-19 ENCOUNTER — Ambulatory Visit (INDEPENDENT_AMBULATORY_CARE_PROVIDER_SITE_OTHER): Payer: No Typology Code available for payment source | Admitting: Family Medicine

## 2014-05-19 ENCOUNTER — Encounter: Payer: Self-pay | Admitting: Family Medicine

## 2014-05-19 VITALS — BP 128/94 | HR 118 | Temp 98.6°F | Ht 71.0 in | Wt 247.6 lb

## 2014-05-19 DIAGNOSIS — F1120 Opioid dependence, uncomplicated: Secondary | ICD-10-CM

## 2014-05-19 DIAGNOSIS — M25569 Pain in unspecified knee: Secondary | ICD-10-CM

## 2014-05-19 DIAGNOSIS — M25562 Pain in left knee: Principal | ICD-10-CM

## 2014-05-19 DIAGNOSIS — F1193 Opioid use, unspecified with withdrawal: Secondary | ICD-10-CM | POA: Insufficient documentation

## 2014-05-19 DIAGNOSIS — M25561 Pain in right knee: Secondary | ICD-10-CM

## 2014-05-19 DIAGNOSIS — F19939 Other psychoactive substance use, unspecified with withdrawal, unspecified: Secondary | ICD-10-CM

## 2014-05-19 DIAGNOSIS — F1123 Opioid dependence with withdrawal: Secondary | ICD-10-CM | POA: Insufficient documentation

## 2014-05-19 MED ORDER — ONDANSETRON 4 MG PO TBDP: 4.0000 mg | ORAL_TABLET | Freq: Three times a day (TID) | ORAL | Status: DC | PRN

## 2014-05-19 MED ORDER — CLONIDINE HCL 0.1 MG PO TABS: 0.1000 mg | ORAL_TABLET | Freq: Two times a day (BID) | ORAL | Status: DC

## 2014-05-19 NOTE — Patient Instructions (Signed)
Please take the medication to help with withdrawal  You can use the zofran for nausea.  Follow up with Dr. Gwendolyn Grant ASAP

## 2014-05-19 NOTE — Telephone Encounter (Signed)
Pt saw dr Adriana Simas today. Dr Adriana Simas requested he see dr Gwendolyn Grant ASAP.  There are no appts until 06-03-14; He is currently withdrawing from opiates. He cannot refill his meds until 9-1. Dr Adriana Simas requested Dr Gwendolyn Grant call  The pt

## 2014-05-19 NOTE — Progress Notes (Signed)
   Subjective:    Patient ID: Trevor Thomas, male    DOB: May 12, 1966, 48 y.o.   MRN: [id number]  HPI 48 year old male with chronic pain presents for a same day appointment with complaints of bilateral knee pain.  1) Knee pain, bilateral, L>R - Chronic but sas been worsening x 4 days.  He reports that his pain has been increasing and he has been taking more pain medication as a result.  He reports that he is taking approximately 8 percocet/day (was previously using ~3 day).  - He notes popping, cracking of his left knee.  He endorses prior surgery on that knee as well (per EMR, this appears to be secondary to patellar dislocation/instability). - No recent fall, trauma, injury. - He denies any redness or swelling. - Pain is 10/10 in severity.  He is currently out of pain medication.  Per his report, he took 3 percocet last night.   Review of Systems Per HPI with the following additions: no fever; + anxiety, back pain.    Objective:   Physical Exam Filed Vitals:   05/19/14 1143  BP: 128/94  Pulse: 118  Temp: 98.6 F (37 C)   Exam: General: very anxious appearing gentleman; rapid speech.  MSK: Knee (bilateral). Normal to inspection with no erythema or effusion or obvious bony abnormalities. Palpation normal with no warmth or joint line tenderness.  ROM - normal.  Ligaments with solid consistent endpoints including ACL, PCL, LCL, MCL. Negative Mcmurray's.  Minimal crepitus - left knee.  Psych: very anxious and tremulous on exam; piloerection noted; rapid speech.     Assessment & Plan:  See Problem List

## 2014-05-19 NOTE — Assessment & Plan Note (Addendum)
Patient tachycardic, tremulous and very anxious.  He reports that he recently took his pain medication (last night); he has been steadily increasing daily intake. Given hx and PE, patient appears to be in withdrawal. I advised him to fill his prior Rx's if he is able.  I started clonidine today for his withdrawal symptoms and advised close followup with PCP. Did not obtain UDS today, but highly recommend this be obtained at next visit (prior UDS + with THC and Cocaine).

## 2014-05-19 NOTE — Telephone Encounter (Signed)
Called patient.  He is out of the house until after 6.  Will call back in AM.

## 2014-05-19 NOTE — Assessment & Plan Note (Addendum)
Left knee with minimal/mild crepitus. Prior xray's reviewed; mild OA noted. No acute findings on exam. No recent injury.  Pain has acutely worsened given that he is out of pain medication.  Pain out of proportion to exam.  Pain/symptoms consistent with opioid withdrawal.

## 2014-05-20 MED ORDER — TRAMADOL HCL 50 MG PO TABS: 50.0000 mg | ORAL_TABLET | Freq: Two times a day (BID) | ORAL | Status: DC

## 2014-05-20 MED ORDER — LORAZEPAM 0.5 MG PO TABS: 0.5000 mg | ORAL_TABLET | Freq: Every day | ORAL | Status: DC

## 2014-05-20 NOTE — Telephone Encounter (Signed)
Called and discussed pain with patient.  He is currently complaining mostly of his chronic knee pain over his back pain.  States he ran out of his Percocet because he was taking 3 at a time to deal with the pain.  Currently still going through withdrawal like symptoms.  Also with symptoms of panic attacks (dyspnea, palpitations, then resolving).    I informed him that I cannot refill his pain medicines early as per the clinic narcotic contract.  I will send in a short term course of Tramadol for pain relief as well as Ativan for panic attacks.  He is to continue the daily Celexa.  Could not afford Diclofenac.

## 2014-05-20 NOTE — Telephone Encounter (Signed)
Pt called again this morning and would like Dr. Gwendolyn Grant to call him at [phone] and he will be at this number for the next 30 minutes. jw

## 2014-05-20 NOTE — Telephone Encounter (Signed)
Called and attempted to speak with patient.  Had to leave voicemail.  Told him in voicemail I will try back tomorrow.

## 2014-06-01 ENCOUNTER — Emergency Department (HOSPITAL_COMMUNITY)
Admission: EM | Admit: 2014-06-01 | Discharge: 2014-06-01 | Disposition: A | Discharge status: Home / Self Care | Payer: No Typology Code available for payment source | Attending: Emergency Medicine | Admitting: Emergency Medicine

## 2014-06-01 ENCOUNTER — Encounter (HOSPITAL_COMMUNITY): Payer: Self-pay | Admitting: Emergency Medicine

## 2014-06-01 DIAGNOSIS — F17200 Nicotine dependence, unspecified, uncomplicated: Secondary | ICD-10-CM | POA: Insufficient documentation

## 2014-06-01 DIAGNOSIS — F329 Major depressive disorder, single episode, unspecified: Secondary | ICD-10-CM | POA: Insufficient documentation

## 2014-06-01 DIAGNOSIS — R5381 Other malaise: Secondary | ICD-10-CM | POA: Insufficient documentation

## 2014-06-01 DIAGNOSIS — J441 Chronic obstructive pulmonary disease with (acute) exacerbation: Secondary | ICD-10-CM | POA: Insufficient documentation

## 2014-06-01 DIAGNOSIS — Z8669 Personal history of other diseases of the nervous system and sense organs: Secondary | ICD-10-CM | POA: Insufficient documentation

## 2014-06-01 DIAGNOSIS — F3289 Other specified depressive episodes: Secondary | ICD-10-CM | POA: Insufficient documentation

## 2014-06-01 DIAGNOSIS — A4902 Methicillin resistant Staphylococcus aureus infection, unspecified site: Secondary | ICD-10-CM | POA: Insufficient documentation

## 2014-06-01 DIAGNOSIS — R42 Dizziness and giddiness: Secondary | ICD-10-CM | POA: Insufficient documentation

## 2014-06-01 DIAGNOSIS — Z8739 Personal history of other diseases of the musculoskeletal system and connective tissue: Secondary | ICD-10-CM | POA: Insufficient documentation

## 2014-06-01 DIAGNOSIS — Z79899 Other long term (current) drug therapy: Secondary | ICD-10-CM | POA: Insufficient documentation

## 2014-06-01 DIAGNOSIS — J45901 Unspecified asthma with (acute) exacerbation: Secondary | ICD-10-CM | POA: Insufficient documentation

## 2014-06-01 DIAGNOSIS — I10 Essential (primary) hypertension: Secondary | ICD-10-CM | POA: Insufficient documentation

## 2014-06-01 DIAGNOSIS — F411 Generalized anxiety disorder: Secondary | ICD-10-CM | POA: Insufficient documentation

## 2014-06-01 DIAGNOSIS — R5383 Other fatigue: Secondary | ICD-10-CM | POA: Insufficient documentation

## 2014-06-01 DIAGNOSIS — K219 Gastro-esophageal reflux disease without esophagitis: Secondary | ICD-10-CM | POA: Insufficient documentation

## 2014-06-01 LAB — URINALYSIS, ROUTINE W REFLEX MICROSCOPIC
BILIRUBIN URINE: NEGATIVE
GLUCOSE, UA: NEGATIVE mg/dL
HGB URINE DIPSTICK: NEGATIVE
KETONES UR: NEGATIVE mg/dL
Leukocytes, UA: NEGATIVE
Nitrite: NEGATIVE
PH: 7 (ref 5.0–8.0)
Protein, ur: NEGATIVE mg/dL
SPECIFIC GRAVITY, URINE: 1.01 (ref 1.005–1.030)
Urobilinogen, UA: 0.2 mg/dL (ref 0.0–1.0)

## 2014-06-01 LAB — CBC WITH DIFFERENTIAL/PLATELET
BASOS PCT: 0 % (ref 0–1)
Basophils Absolute: 0 10*3/uL (ref 0.0–0.1)
Eosinophils Absolute: 0.3 10*3/uL (ref 0.0–0.7)
Eosinophils Relative: 3 % (ref 0–5)
HCT: 44.5 % (ref 39.0–52.0)
Hemoglobin: 15 g/dL (ref 13.0–17.0)
Lymphocytes Relative: 37 % (ref 12–46)
Lymphs Abs: 3.2 10*3/uL (ref 0.7–4.0)
MCH: 30.9 pg (ref 26.0–34.0)
MCHC: 33.7 g/dL (ref 30.0–36.0)
MCV: 91.6 fL (ref 78.0–100.0)
Monocytes Absolute: 0.5 10*3/uL (ref 0.1–1.0)
Monocytes Relative: 6 % (ref 3–12)
NEUTROS PCT: 54 % (ref 43–77)
Neutro Abs: 4.8 10*3/uL (ref 1.7–7.7)
PLATELETS: 262 10*3/uL (ref 150–400)
RBC: 4.86 MIL/uL (ref 4.22–5.81)
RDW: 12.4 % (ref 11.5–15.5)
WBC: 8.7 10*3/uL (ref 4.0–10.5)

## 2014-06-01 LAB — COMPREHENSIVE METABOLIC PANEL
ALBUMIN: 3.9 g/dL (ref 3.5–5.2)
ALK PHOS: 96 U/L (ref 39–117)
ALT: 20 U/L (ref 0–53)
AST: 19 U/L (ref 0–37)
Anion gap: 13 (ref 5–15)
BILIRUBIN TOTAL: 0.2 mg/dL — AB (ref 0.3–1.2)
BUN: 16 mg/dL (ref 6–23)
CO2: 28 mEq/L (ref 19–32)
Calcium: 9.4 mg/dL (ref 8.4–10.5)
Chloride: 99 mEq/L (ref 96–112)
Creatinine, Ser: 0.85 mg/dL (ref 0.50–1.35)
GFR calc Af Amer: >90 mL/min (ref >90)
GFR calc non Af Amer: >90 mL/min (ref >90)
Glucose, Bld: 135 mg/dL — ABNORMAL HIGH (ref 70–99)
POTASSIUM: 4.4 meq/L (ref 3.7–5.3)
SODIUM: 140 meq/L (ref 137–147)
TOTAL PROTEIN: 7.2 g/dL (ref 6.0–8.3)

## 2014-06-01 MED ORDER — DOXYCYCLINE HYCLATE 100 MG PO CAPS: 100.0000 mg | ORAL_CAPSULE | Freq: Two times a day (BID) | ORAL | Status: DC

## 2014-06-01 MED ORDER — MUPIROCIN CALCIUM 2 % NA OINT: TOPICAL_OINTMENT | NASAL | Status: DC

## 2014-06-01 NOTE — ED Notes (Addendum)
Pt has white scab in nose x 2 -3 days that is causing facial pain, states it hurts to touch and sniff. Pt c/o generalized weakness states he has no energy. Pt also c/o dizziness upon standing at times. Pt denies n/v. Pt also has anxiety.

## 2014-06-01 NOTE — Discharge Instructions (Signed)
Community-Associated MRSA °CA-MRSA stands for community-associated methicillin-resistant Staphylococcus aureus. MRSA is a type of bacteria that is resistant to some common antibiotics. It can cause infections in the skin and many other places in the body. Staphylococcus aureus, often called "staph," is a bacteria that normally lives on the skin or in the nose. Staph on the surface of the skin or in the nose does not cause problems. However, if the staph enters the body through a cut, wound, or break in the skin, an infection can happen. °Up until recently, infections with the MRSA type of staph mainly occurred in hospitals and other health care settings. There are now increasing problems with MRSA infections in the community as well. Infections with MRSA may be very serious or even life threatening. °CA-MRSA is becoming more common. It is known to spread in crowded settings, in jails and prisons, and in situations where there is close skin-to-skin contact, such as during sporting events or in locker rooms. MRSA can be spread through shared items, such as children's toys, razors, towels, or sports equipment.  °CAUSES °All staph, including MRSA, are normally harmless unless they enter the body through a scratch, cut, or wound, such as with surgery. All staph, including MRSA, can be spread from person-to-person by touching contaminated objects or through direct contact. °· MRSA now causes illness in people who have not been in hospitals or other health care facilities. Cases of MRSA diseases in the community have been associated with: °¨ Recent antibiotic use. °¨ Sharing contaminated towels or clothes. °¨ Having active skin diseases. °¨ Participating in contact sports. °¨ Living in crowded settings. °¨ Intravenous (IV) drug use. °· Community-associated MRSA infections are usually skin infections, but may cause other severe illnesses. °· Staph bacteria are one of the most common causes of skin infection. However, they  are also a common cause of pneumonia, bone or joint infections, and bloodstream infections. °DIAGNOSIS °Diagnosis of MRSA is done by cultures of fluid samples that may come from: °· Swabs taken from cuts or wounds in infected areas. °· Nasal swabs. °· Saliva or deep cough specimens from the lungs (sputum). °· Urine. °· Blood. °Many people are "colonized" with MRSA but have no signs of infection. This means that people carry the MRSA germ on their skin or in their nose and may never develop MRSA infection.  °TREATMENT  °Treatment varies and is based on how serious, how deep, or how extensive the infection is. For example: °· Some skin infections, such as a small boil or abscess, may be treated by draining yellowish-white fluid (pus) from the site of the infection. °· Deeper or more widespread soft tissue infections are usually treated with surgery to drain pus and with antibiotic medicine given by vein or by mouth. This may be recommended even if you are pregnant. °· Serious infections may require a hospital stay. °If antibiotics are given, they may be needed for several weeks. °PREVENTION °Because many people are colonized with staph, including MRSA, preventing the spread of the bacteria from person-to-person is most important. The best way to prevent the spread of bacteria and other germs is through proper hand washing or by using alcohol-based hand disinfectants. The following are other ways to help prevent MRSA infection within community settings.  °· Wash your hands frequently with soap and water for at least 15 seconds. Otherwise, use alcohol-based hand disinfectants when soap and water is not available. °· Make sure people who live with you wash their hands often, too. °·   Do not share personal items. For example, avoid sharing razors and other personal hygiene items, towels, clothing, and athletic equipment. °· Wash and dry your clothes and bedding at the warmest temperatures recommended on the labels. °· Keep  wounds covered. Pus from infected sores may contain MRSA and other bacteria. Keep cuts and abrasions clean and covered with germ-free (sterile), dry bandages until they are healed. °· If you have a wound that appears infected, ask your caregiver if a culture for MRSA and other bacteria should be done. °· If you are breastfeeding, talk to your caregiver about MRSA. You may be asked to temporarily stop breastfeeding. °HOME CARE INSTRUCTIONS  °· Take your antibiotics as directed. Finish them even if you start to feel better. °· Avoid close contact with those around you as much as possible. Do not use towels, razors, toothbrushes, bedding, or other items that will be used by others. °· To fight the infection, follow your caregiver's instructions for wound care. Wash your hands before and after changing your bandages. °· If you have an intravascular device, such as a catheter, make sure you know how to care for it. °· Be sure to tell any health care providers that you have MRSA so they are aware of your infection. °SEEK IMMEDIATE MEDICAL CARE IF: °· The infection appears to be getting worse. Signs include: °¨ Increased warmth, redness, or tenderness around the wound site. °¨ A red line that extends from the infection site. °¨ A dark color in the area around the infection. °¨ Wound drainage that is tan, yellow, or green. °¨ A bad smell coming from the wound. °· You feel sick to your stomach (nauseous) and throw up (vomit) or cannot keep medicine down. °· You have a fever. °· Your baby is older than 3 months with a rectal temperature of 102°F (38.9°C) or higher. °· Your baby is 3 months old or younger with a rectal temperature of 100.4°F (38°C) or higher. °· You have difficulty breathing. °MAKE SURE YOU:  °· Understand these instructions. °· Will watch your condition. °· Will get help right away if you are not doing well or get worse. °Document Released: 12/23/2005 Document Revised: 02/03/2014 Document Reviewed:  12/23/2010 °Exit[employer]® Patient Information ©2015 [employer]. This information is not intended to replace advice given to you by your health care provider. Make sure you discuss any questions you have with your health care provider. ° °

## 2014-06-01 NOTE — ED Provider Notes (Signed)
CSN: [id number]     Arrival date & time 06/01/14  1626 History   First MD Initiated Contact with Patient 06/01/14 1902     Chief Complaint  Patient presents with  . Weakness  . Dizziness  . Facial Swelling     (Consider location/radiation/quality/duration/timing/severity/associated sxs/prior Treatment) HPI Comments: Patient complains of 3 days of left nostril pain which is sensitive to the touch. No fever or chills. No nasal drainage. Denies any trauma. States no use of inhalants symptoms are persistent and noted with myalgias. No urinary symptoms. No cough or congestion. No prior history of same. Nothing makes his symptoms better worse and no treatment used prior to arrival.  Patient is a 48 y.o. male presenting with weakness and dizziness. The history is provided by the patient.  Weakness  Dizziness   Past Medical History  Diagnosis Date  . Hypertension   . COPD (chronic obstructive pulmonary disease)   . Bronchitis     hx of  . Asthma   . Concussion 1983  . Headache(784.0)   . Anxiety   . Depression   . Umbilical hernia   . GERD (gastroesophageal reflux disease)     takes tums/rolaids  . Arthritis   . Head trauma   . 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 Knee surgery    . Thumb surgery (left)    . Umbilical hernia repair  05/15/2012    Procedure: HERNIA REPAIR UMBILICAL ADULT;  Surgeon: Clovis Pu. Cornett, MD;  Location: [facility] OR;  Service: General;  Laterality: N/A;  . Hernia repair  05/2012    umbilical hernia  . Eye surgery  20 years ago    Metal foreign body removed from Right eye   Family History  Problem Relation Age of Onset  . Diabetes Mother   . Diabetes Father   . Heart failure Brother    History  Substance Use Topics  . Smoking status: Current Every Day Smoker -- 0.75 packs/day for 20 years    Types: Cigarettes  . Smokeless tobacco: Never Used  . Alcohol Use:  No     Comment: rarely  - quit approx. June 2013    Review of Systems  Neurological: Positive for dizziness and weakness.  All other systems reviewed and are negative.     Allergies  Bee venom  Home Medications   Prior to Admission medications   Medication Sig Start Date End Date Taking? Authorizing Provider  albuterol (PROVENTIL HFA;VENTOLIN HFA) 108 (90 BASE) MCG/ACT inhaler Inhale 2 puffs into the lungs every 6 (six) hours as needed for wheezing.   Yes Historical Provider, MD  citalopram (CELEXA) 20 MG tablet Take 20 mg by mouth daily.   Yes Historical Provider, MD  cloNIDine (CATAPRES) 0.1 MG tablet Take 0.1 mg by mouth daily.   Yes Historical Provider, MD  EPINEPHrine 0.3 mg/0.3 mL IJ SOAJ injection Inject 0.3 mLs (0.3 mg total) into the muscle once. 03/13/14  Yes Jimmye Norman, NP  lisinopril-hydrochlorothiazide (PRINZIDE,ZESTORETIC) 20-25 MG per tablet Take 1 tablet by mouth every morning. 04/08/14  Yes Tobey Grim, MD  loratadine (CLARITIN) 10 MG tablet Take 1 tablet (10 mg total) by mouth every morning. 04/08/14  Yes Tobey Grim, MD  LORazepam (ATIVAN) 0.5 MG tablet Take 0.5 mg by mouth at bedtime.   Yes Historical Provider, MD  oxyCODONE-acetaminophen (PERCOCET/ROXICET) 5-325 MG per tablet Take  1 tablet by mouth every 8 (eight) hours.   Yes Historical Provider, MD  traMADol (ULTRAM) 50 MG tablet Take 50 mg by mouth at bedtime.   Yes Historical Provider, MD   BP 132/87  Pulse 88  Temp(Src) 98.8 F (37.1 C) (Oral)  Resp 20  SpO2 96% Physical Exam  Nursing note and vitals reviewed. Constitutional: He is oriented to person, place, and time. He appears well-developed and well-nourished.  Non-toxic appearance. No distress.  HENT:  Head: Normocephalic and atraumatic.  Nose:    Eyes: Conjunctivae, EOM and lids are normal. Pupils are equal, round, and reactive to light.  Neck: Normal range of motion. Neck supple. No tracheal deviation present. No mass present.    Cardiovascular: Normal rate, regular rhythm and normal heart sounds.  Exam reveals no gallop.   No murmur heard. Pulmonary/Chest: Effort normal and breath sounds normal. No stridor. No respiratory distress. He has no decreased breath sounds. He has no wheezes. He has no rhonchi. He has no rales.  Abdominal: Soft. Normal appearance and bowel sounds are normal. He exhibits no distension. There is no tenderness. There is no rebound and no CVA tenderness.  Musculoskeletal: Normal range of motion. He exhibits no edema and no tenderness.  Neurological: He is alert and oriented to person, place, and time. He has normal strength. No cranial nerve deficit or sensory deficit. GCS eye subscore is 4. GCS verbal subscore is 5. GCS motor subscore is 6.  Skin: Skin is warm and dry. No abrasion and no rash noted.  Psychiatric: He has a normal mood and affect. His speech is normal and behavior is normal.    ED Course  Procedures (including critical care time) Labs Review Labs Reviewed  COMPREHENSIVE METABOLIC PANEL - Abnormal; Notable for the following:    Glucose, Bld 135 (*)    Total Bilirubin 0.2 (*)    All other components within normal limits  CBC WITH DIFFERENTIAL  URINALYSIS, ROUTINE W REFLEX MICROSCOPIC    Imaging Review No results found.   EKG Interpretation None      MDM   Final diagnoses:  None    Will treat patient for suspected MRSA infection.    Toy Baker, MD 06/01/14 2002

## 2014-06-02 ENCOUNTER — Telehealth (HOSPITAL_BASED_OUTPATIENT_CLINIC_OR_DEPARTMENT_OTHER): Payer: Self-pay | Admitting: Emergency Medicine

## 2014-06-03 ENCOUNTER — Ambulatory Visit (INDEPENDENT_AMBULATORY_CARE_PROVIDER_SITE_OTHER): Payer: No Typology Code available for payment source | Admitting: Family Medicine

## 2014-06-03 ENCOUNTER — Encounter: Payer: Self-pay | Admitting: Family Medicine

## 2014-06-03 VITALS — BP 145/89 | HR 93 | Temp 98.1°F | Ht 71.0 in | Wt 250.1 lb

## 2014-06-03 DIAGNOSIS — J3489 Other specified disorders of nose and nasal sinuses: Secondary | ICD-10-CM

## 2014-06-03 DIAGNOSIS — F418 Other specified anxiety disorders: Secondary | ICD-10-CM

## 2014-06-03 DIAGNOSIS — F341 Dysthymic disorder: Secondary | ICD-10-CM

## 2014-06-03 DIAGNOSIS — G8929 Other chronic pain: Secondary | ICD-10-CM

## 2014-06-03 DIAGNOSIS — I10 Essential (primary) hypertension: Secondary | ICD-10-CM

## 2014-06-03 DIAGNOSIS — M549 Dorsalgia, unspecified: Secondary | ICD-10-CM

## 2014-06-03 MED ORDER — OXYCODONE-ACETAMINOPHEN 5-325 MG PO TABS: 1.0000 | ORAL_TABLET | Freq: Three times a day (TID) | ORAL | Status: DC | PRN

## 2014-06-03 MED ORDER — MUPIROCIN CALCIUM 2 % NA OINT: TOPICAL_OINTMENT | NASAL | Status: DC

## 2014-06-03 MED ORDER — SULFAMETHOXAZOLE-TMP DS 800-160 MG PO TABS: 1.0000 | ORAL_TABLET | Freq: Two times a day (BID) | ORAL | Status: DC

## 2014-06-03 MED ORDER — OXYCODONE-ACETAMINOPHEN 5-325 MG PO TABS: 1.0000 | ORAL_TABLET | Freq: Three times a day (TID) | ORAL | Status: DC

## 2014-06-03 NOTE — Patient Instructions (Signed)
Place the cream in your nose 3-4 times a day.  Refill for pain medicines today.  Take the Bactrim twice a day for 7 days.    Good to see you again today

## 2014-06-03 NOTE — Assessment & Plan Note (Addendum)
Recommended to continue his home back exercises and stretches Heating pad Refill for narcotics today.   He wanted [place] to sign a "you can come in anytime and count my pills letter.  Signed by myself and Jimmy Footman.  Scanned into chart.

## 2014-06-03 NOTE — Progress Notes (Signed)
Subjective:    Trevor Thomas is a 48 y.o. male who presents to [facility] today for several issues:  1.  Chronic back pain:  Present most days.  See prior telephone notes for recent trouble with this.  States his mom came here and "set him straight."  She too takes Percocet TID as well and states he has to follow his regimen and not take pills early as we have discussed on our visits.  States he is now on TID administration.  Took Tramadol and Lorazepam at night which helped relieve some of his pain and anxiety.  Pain worse with bending or prolonged standing.    2.  Nasal lesion:  Seen in ED for same.  Concern for MRSA at that time.  Provided Doxycycline and Bactroban but he was unable to afford these.  Still with pain.  NO drainage or bleeding.  3.  Hypertension:  Long-term problem for this patient.  No adverse effects from medication.  Not checking it regularly.  No HA, CP, dizziness, shortness of breath, palpitations, or LE swelling.  Previously at goal.   BP Readings from Last 3 Encounters:  06/03/14 145/89  06/01/14 111/82  05/19/14 128/94     ROS as above per HPI, otherwise neg.    The following portions of the patient's history were reviewed and updated as appropriate: allergies, current medications, past medical history, family and social history, and problem list. Patient is a nonsmoker.    PMH reviewed.  Past Medical History  Diagnosis Date  . Hypertension   . COPD (chronic obstructive pulmonary disease)   . Bronchitis     hx of  . Asthma   . Concussion 1983  . Headache(784.0)   . Anxiety   . Depression   . Umbilical hernia   . GERD (gastroesophageal reflux disease)     takes tums/rolaids  . Arthritis   . Head trauma   . 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 Knee surgery    . Thumb surgery (left)    . Umbilical hernia repair  05/15/2012    Procedure: HERNIA REPAIR UMBILICAL ADULT;   Surgeon: Clovis Pu. Cornett, MD;  Location: [facility] OR;  Service: General;  Laterality: N/A;  . Hernia repair  05/2012    umbilical hernia  . Eye surgery  20 years ago    Metal foreign body removed from Right eye    Medications reviewed. Current Outpatient Prescriptions  Medication Sig Dispense Refill  . albuterol (PROVENTIL HFA;VENTOLIN HFA) 108 (90 BASE) MCG/ACT inhaler Inhale 2 puffs into the lungs every 6 (six) hours as needed for wheezing.      . citalopram (CELEXA) 20 MG tablet Take 20 mg by mouth daily.      Marland Kitchen EPINEPHrine 0.3 mg/0.3 mL IJ SOAJ injection Inject 0.3 mLs (0.3 mg total) into the muscle once.  1 Device  0  . lisinopril-hydrochlorothiazide (PRINZIDE,ZESTORETIC) 20-25 MG per tablet Take 1 tablet by mouth every morning.  90 tablet  2  . loratadine (CLARITIN) 10 MG tablet Take 1 tablet (10 mg total) by mouth every morning.  30 tablet  11  . LORazepam (ATIVAN) 0.5 MG tablet Take 0.5 mg by mouth at bedtime.      . mupirocin nasal ointment (BACTROBAN) 2 % Apply in each nostril daily  1 g  0  . oxyCODONE-acetaminophen (PERCOCET/ROXICET) 5-325 MG per tablet Take 1 tablet by  mouth every 8 (eight) hours. Do not fill for 30 days  90 tablet  0  . oxyCODONE-acetaminophen (ROXICET) 5-325 MG per tablet Take 1 tablet by mouth every 8 (eight) hours as needed for severe pain.  90 tablet  0  . oxyCODONE-acetaminophen (ROXICET) 5-325 MG per tablet Take 1 tablet by mouth every 8 (eight) hours as needed for severe pain. Do not fill for 60 days  90 tablet  0  . sulfamethoxazole-trimethoprim (BACTRIM DS) 800-160 MG per tablet Take 1 tablet by mouth 2 (two) times daily. X 7 days  14 tablet  0  . traMADol (ULTRAM) 50 MG tablet Take 50 mg by mouth at bedtime.       No current facility-administered medications for this visit.     Objective:   Physical Exam BP 145/89  Pulse 93  Temp(Src) 98.1 F (36.7 C) (Oral)  Ht  (1.803 m)  Wt 250 lb 1.6 oz (113.445 kg)  BMI 34.90 kg/m2 Gen:  Alert,  cooperative patient who appears stated age in no acute distress.  Vital signs reviewed. HEENT: EOMI,  MMM.  Nasal lesion noted on ala of Left nostril, interior flap.  No drainage.  Possilbly secondarily infected, redness noted. Cardiac:  Regular rate and rhythm without murmur auscultated.  Good S1/S2. Pulm:  Clear to auscultation bilaterally with good air movement.  No wheezes or rales noted.   MSK:  No tenderness to back today.  Some mild spasm BL lower quadrants.  Negative SLR BL.  Decreased forward flexion secondary to pain, more on Right than Left side. Exts: Non edematous BL  LE, warm and well perfused.

## 2014-06-04 DIAGNOSIS — J3489 Other specified disorders of nose and nasal sinuses: Secondary | ICD-10-CM | POA: Insufficient documentation

## 2014-06-04 HISTORY — DX: Other specified disorders of nose and nasal sinuses: J34.89

## 2014-06-04 NOTE — Assessment & Plan Note (Signed)
Elevated today.  Has been at goal last 2 times.  Repeat next visit, no changes

## 2014-06-04 NOTE — Assessment & Plan Note (Signed)
Concern for MRSA in ED He has been picking at it so I believe this is confounding things. Switch to Bactrim as he wasn't able to pay for Doxy.  Printed coupon for Bactroban for him.

## 2014-06-04 NOTE — Assessment & Plan Note (Signed)
Better now he is taking Celexa again. Continue this

## 2014-07-29 ENCOUNTER — Other Ambulatory Visit: Payer: Self-pay | Admitting: Family Medicine

## 2014-07-29 NOTE — Telephone Encounter (Signed)
Pt called and needs a refill on his Citalopram. jw

## 2014-08-20 ENCOUNTER — Other Ambulatory Visit: Payer: Self-pay | Admitting: Family Medicine

## 2014-08-20 NOTE — Telephone Encounter (Signed)
Would like dr Gwendolyn Grant to call in some tramadol. He is not sleeping at night [facility] on [address] [phone]

## 2014-08-22 MED ORDER — TRAMADOL HCL 50 MG PO TABS: 50.0000 mg | ORAL_TABLET | Freq: Every day | ORAL | Status: DC

## 2014-08-22 NOTE — Telephone Encounter (Signed)
Calling back to ask for the tramadol to be written and left up front for pickup.  Contact patient when ready to get

## 2014-08-24 ENCOUNTER — Other Ambulatory Visit: Payer: Self-pay

## 2014-08-24 ENCOUNTER — Inpatient Hospital Stay (HOSPITAL_COMMUNITY)
Admission: EM | Admit: 2014-08-24 | Discharge: 2014-08-25 | DRG: 384 | Disposition: A | Discharge status: Home / Self Care | Payer: Self-pay | Attending: Family Medicine | Admitting: Family Medicine

## 2014-08-24 ENCOUNTER — Encounter (HOSPITAL_COMMUNITY): Payer: Self-pay | Admitting: Emergency Medicine

## 2014-08-24 DIAGNOSIS — I959 Hypotension, unspecified: Secondary | ICD-10-CM | POA: Diagnosis present

## 2014-08-24 DIAGNOSIS — M1990 Unspecified osteoarthritis, unspecified site: Secondary | ICD-10-CM | POA: Diagnosis present

## 2014-08-24 DIAGNOSIS — K259 Gastric ulcer, unspecified as acute or chronic, without hemorrhage or perforation: Principal | ICD-10-CM | POA: Diagnosis present

## 2014-08-24 DIAGNOSIS — R200 Anesthesia of skin: Secondary | ICD-10-CM | POA: Diagnosis present

## 2014-08-24 DIAGNOSIS — K920 Hematemesis: Secondary | ICD-10-CM | POA: Diagnosis present

## 2014-08-24 DIAGNOSIS — Z9119 Patient's noncompliance with other medical treatment and regimen: Secondary | ICD-10-CM | POA: Diagnosis present

## 2014-08-24 DIAGNOSIS — I10 Essential (primary) hypertension: Secondary | ICD-10-CM | POA: Diagnosis present

## 2014-08-24 DIAGNOSIS — K250 Acute gastric ulcer with hemorrhage: Secondary | ICD-10-CM

## 2014-08-24 DIAGNOSIS — R202 Paresthesia of skin: Secondary | ICD-10-CM | POA: Diagnosis present

## 2014-08-24 DIAGNOSIS — R000 Tachycardia, unspecified: Secondary | ICD-10-CM | POA: Diagnosis present

## 2014-08-24 DIAGNOSIS — F419 Anxiety disorder, unspecified: Secondary | ICD-10-CM | POA: Diagnosis present

## 2014-08-24 DIAGNOSIS — K254 Chronic or unspecified gastric ulcer with hemorrhage: Secondary | ICD-10-CM

## 2014-08-24 DIAGNOSIS — J449 Chronic obstructive pulmonary disease, unspecified: Secondary | ICD-10-CM | POA: Diagnosis present

## 2014-08-24 DIAGNOSIS — Z91030 Bee allergy status: Secondary | ICD-10-CM

## 2014-08-24 DIAGNOSIS — Z720 Tobacco use: Secondary | ICD-10-CM

## 2014-08-24 DIAGNOSIS — G8929 Other chronic pain: Secondary | ICD-10-CM | POA: Diagnosis present

## 2014-08-24 DIAGNOSIS — K2980 Duodenitis without bleeding: Secondary | ICD-10-CM | POA: Diagnosis present

## 2014-08-24 DIAGNOSIS — G2581 Restless legs syndrome: Secondary | ICD-10-CM | POA: Diagnosis present

## 2014-08-24 DIAGNOSIS — M549 Dorsalgia, unspecified: Secondary | ICD-10-CM

## 2014-08-24 DIAGNOSIS — J309 Allergic rhinitis, unspecified: Secondary | ICD-10-CM | POA: Diagnosis present

## 2014-08-24 DIAGNOSIS — I9589 Other hypotension: Secondary | ICD-10-CM | POA: Diagnosis present

## 2014-08-24 DIAGNOSIS — I639 Cerebral infarction, unspecified: Secondary | ICD-10-CM

## 2014-08-24 DIAGNOSIS — K219 Gastro-esophageal reflux disease without esophagitis: Secondary | ICD-10-CM | POA: Diagnosis present

## 2014-08-24 DIAGNOSIS — F418 Other specified anxiety disorders: Secondary | ICD-10-CM | POA: Diagnosis present

## 2014-08-24 DIAGNOSIS — K921 Melena: Secondary | ICD-10-CM | POA: Diagnosis present

## 2014-08-24 DIAGNOSIS — F329 Major depressive disorder, single episode, unspecified: Secondary | ICD-10-CM | POA: Diagnosis present

## 2014-08-24 DIAGNOSIS — K922 Gastrointestinal hemorrhage, unspecified: Secondary | ICD-10-CM | POA: Diagnosis present

## 2014-08-24 DIAGNOSIS — J45909 Unspecified asthma, uncomplicated: Secondary | ICD-10-CM | POA: Diagnosis present

## 2014-08-24 LAB — COMPREHENSIVE METABOLIC PANEL
ALT: 12 U/L (ref 0–53)
AST: 14 U/L (ref 0–37)
Albumin: 3.5 g/dL (ref 3.5–5.2)
Alkaline Phosphatase: 74 U/L (ref 39–117)
Anion gap: 14 (ref 5–15)
BUN: 40 mg/dL — AB (ref 6–23)
CALCIUM: 9.1 mg/dL (ref 8.4–10.5)
CHLORIDE: 100 meq/L (ref 96–112)
CO2: 23 meq/L (ref 19–32)
Creatinine, Ser: 0.77 mg/dL (ref 0.50–1.35)
GFR calc Af Amer: >90 mL/min (ref >90)
Glucose, Bld: 135 mg/dL — ABNORMAL HIGH (ref 70–99)
Potassium: 4.3 mEq/L (ref 3.7–5.3)
Sodium: 137 mEq/L (ref 137–147)
Total Bilirubin: 0.4 mg/dL (ref 0.3–1.2)
Total Protein: 6.3 g/dL (ref 6.0–8.3)

## 2014-08-24 LAB — URINALYSIS, ROUTINE W REFLEX MICROSCOPIC
BILIRUBIN URINE: NEGATIVE
Glucose, UA: NEGATIVE mg/dL
HGB URINE DIPSTICK: NEGATIVE
Ketones, ur: NEGATIVE mg/dL
Leukocytes, UA: NEGATIVE
Nitrite: NEGATIVE
PROTEIN: NEGATIVE mg/dL
Specific Gravity, Urine: 1.021 (ref 1.005–1.030)
Urobilinogen, UA: 0.2 mg/dL (ref 0.0–1.0)
pH: 7 (ref 5.0–8.0)

## 2014-08-24 LAB — CBC WITH DIFFERENTIAL/PLATELET
BASOS ABS: 0 10*3/uL (ref 0.0–0.1)
Basophils Relative: 0 % (ref 0–1)
EOS PCT: 1 % (ref 0–5)
Eosinophils Absolute: 0.1 10*3/uL (ref 0.0–0.7)
HEMATOCRIT: 37.2 % — AB (ref 39.0–52.0)
HEMOGLOBIN: 12.4 g/dL — AB (ref 13.0–17.0)
LYMPHS PCT: 26 % (ref 12–46)
Lymphs Abs: 3.5 10*3/uL (ref 0.7–4.0)
MCH: 30.5 pg (ref 26.0–34.0)
MCHC: 33.3 g/dL (ref 30.0–36.0)
MCV: 91.4 fL (ref 78.0–100.0)
MONO ABS: 0.7 10*3/uL (ref 0.1–1.0)
Monocytes Relative: 5 % (ref 3–12)
NEUTROS ABS: 9.2 10*3/uL — AB (ref 1.7–7.7)
Neutrophils Relative %: 68 % (ref 43–77)
Platelets: 270 10*3/uL (ref 150–400)
RBC: 4.07 MIL/uL — ABNORMAL LOW (ref 4.22–5.81)
RDW: 12.6 % (ref 11.5–15.5)
WBC: 13.5 10*3/uL — AB (ref 4.0–10.5)

## 2014-08-24 LAB — LIPASE, BLOOD: LIPASE: 10 U/L — AB (ref 11–59)

## 2014-08-24 LAB — I-STAT TROPONIN, ED: Troponin i, poc: 0.01 ng/mL (ref 0.00–0.08)

## 2014-08-24 LAB — MRSA PCR SCREENING: MRSA BY PCR: NEGATIVE

## 2014-08-24 MED ORDER — SODIUM CHLORIDE 0.9 % IV BOLUS (SEPSIS)
1000.0000 mL | Freq: Once | INTRAVENOUS | Status: AC
  Administered 2014-08-24: 1000 mL via INTRAVENOUS

## 2014-08-24 MED ORDER — LORAZEPAM 0.5 MG PO TABS
0.5000 mg | ORAL_TABLET | Freq: Every day | ORAL | Status: DC
  Administered 2014-08-24: 0.5 mg via ORAL
  Filled 2014-08-24: qty 1

## 2014-08-24 MED ORDER — SODIUM CHLORIDE 0.9 % IV SOLN: INTRAVENOUS | Status: AC

## 2014-08-24 MED ORDER — OXYCODONE-ACETAMINOPHEN 5-325 MG PO TABS
1.0000 | ORAL_TABLET | Freq: Three times a day (TID) | ORAL | Status: DC
  Administered 2014-08-24 – 2014-08-25 (×3): 1 via ORAL
  Filled 2014-08-24 (×3): qty 1

## 2014-08-24 MED ORDER — ALBUTEROL SULFATE (2.5 MG/3ML) 0.083% IN NEBU: 2.5000 mg | INHALATION_SOLUTION | Freq: Four times a day (QID) | RESPIRATORY_TRACT | Status: DC | PRN

## 2014-08-24 MED ORDER — ACETAMINOPHEN 650 MG RE SUPP: 650.0000 mg | Freq: Four times a day (QID) | RECTAL | Status: DC | PRN

## 2014-08-24 MED ORDER — ACETAMINOPHEN 325 MG PO TABS
650.0000 mg | ORAL_TABLET | Freq: Four times a day (QID) | ORAL | Status: DC | PRN
  Administered 2014-08-25: 325 mg via ORAL
  Filled 2014-08-24: qty 2

## 2014-08-24 MED ORDER — LORAZEPAM 2 MG/ML IJ SOLN
1.0000 mg | Freq: Once | INTRAMUSCULAR | Status: AC
  Administered 2014-08-24: 1 mg via INTRAVENOUS
  Filled 2014-08-24: qty 1

## 2014-08-24 MED ORDER — CITALOPRAM HYDROBROMIDE 20 MG PO TABS
20.0000 mg | ORAL_TABLET | Freq: Every day | ORAL | Status: DC
  Administered 2014-08-24 – 2014-08-25 (×2): 20 mg via ORAL
  Filled 2014-08-24 (×2): qty 1

## 2014-08-24 MED ORDER — SODIUM CHLORIDE 0.9 % IV SOLN: INTRAVENOUS | Status: DC

## 2014-08-24 MED ORDER — SODIUM CHLORIDE 0.9 % IV SOLN
8.0000 mg/h | INTRAVENOUS | Status: DC
  Administered 2014-08-24 – 2014-08-25 (×2): 8 mg/h via INTRAVENOUS
  Filled 2014-08-24 (×7): qty 80

## 2014-08-24 MED ORDER — GI COCKTAIL ~~LOC~~
30.0000 mL | Freq: Once | ORAL | Status: AC
  Administered 2014-08-24: 30 mL via ORAL
  Filled 2014-08-24: qty 30

## 2014-08-24 MED ORDER — TRAMADOL HCL 50 MG PO TABS
50.0000 mg | ORAL_TABLET | Freq: Every day | ORAL | Status: DC
  Administered 2014-08-24: 50 mg via ORAL
  Filled 2014-08-24: qty 1

## 2014-08-24 MED ORDER — PANTOPRAZOLE SODIUM 40 MG IV SOLR
40.0000 mg | Freq: Once | INTRAVENOUS | Status: AC
  Administered 2014-08-24: 40 mg via INTRAVENOUS
  Filled 2014-08-24: qty 40

## 2014-08-24 MED ORDER — SODIUM CHLORIDE 0.9 % IV SOLN
INTRAVENOUS | Status: DC
  Administered 2014-08-24 – 2014-08-25 (×2): via INTRAVENOUS

## 2014-08-24 NOTE — Progress Notes (Signed)
Pt just got to floor in room and came by ambulance from [facility] ED. Pt is here for an GI. Pt states he had a bowel movement today before going to ED that was dark red in color but has had no BM since at [facility]. Pt states abdomen hurts. Pt's vitals within normal limits. Pt nares swabbed. Called central telemetry and notified of pt admission. Pt is alert and oriented. Night nurse will continue to admit pt where he came at 7 pm. Md in to see pt now.

## 2014-08-24 NOTE — H&P (Signed)
Family Medicine Teaching [facility] Admission History and Physical Service Pager: [phone]  Patient name: Trevor Thomas Medical record number: [id number] Date of birth: Apr 14, 1966 Age: 48 y.o. Gender: male  Primary Care Provider: Renold Don, MD Consultants: GI - Dr. Georgina Quint Code Status: Full  Chief Complaint: Hematemesis  Assessment and Plan: Trevor Thomas is a 48 y.o. male presenting with upper GI bleed . PMH is significant for hypertension, chronic knee/back/neck pain, COPD, restless leg syndrome, and anxiety.  Upper GI bleed - Noted anatomically stable, no active hematemesis - Hemoglobin down 2-3 g from baseline hemoglobin of 15, hemoglobin 12.4 today - Signs of volume depletion with tachycardia and soft blood pressures on presentation to the ED, has responded to fluids - Admit to step down unit - Continue IV Protonix drip - IV fluids: Normal saline at 125 mL/hr, bolus as needed - GI consulted by ED - I appreciate their recommendations  - Keep nothing by mouth overnight for likely EGD tomorrow - Repeat CBC in a.m.  Hypertension - Relative hypotension on presentation to the ED, now normotensive after 2 L of NS bolus - BP 109/61 currently - Hold home antihypertensives including lisinopril and HCTZ  COPD - Increased wheezing is slightly increased cough compared to baseline - Monitor closely, but I don't believe this is an exacerbation - Continue PRN albuterol  Chronic pain - At baseline - Continue home narcotics and tramadol  Anxiety-  - feeling anxious and aggravated with being sick today - Give one-time dose of IV Ativan now - Continue Celexa and Ativan per home dose   FEN/GI: NS at 125 mL/hr, NPO Prophylaxis: Protonix drip, no anticoagulation with GI bleed  Disposition: Step down unit for close monitoring  Hisory of Present Illness: Trevor Thomas is a 48 y.o. male presenting hematemesis 2, and hematochezia. Patient states that he was feeling well  yesterday when he was selling items at the flea market. He states that he woke up around 3 AM this morning sweating and feeling  slightly confused when he went to the bathroom and had hematochezia 1. He then felt nauseous and had a large episode of hematemesis with grossly red blood and food contents. He woke up 3 hours later and had another episode of hematemesis. He states that now he has only abdominal soreness and denies any nausea. He's not had another episode of hematemesis or hematochezia since presenting to the ER. He's feeling anxious and having trouble with his restless legs, as well as a slightly increased cough for the last day or 2. He denies chest pain and dyspnea.  He also notes confusion at the time of emesis as well as left arm tingling, described as pins and needles. He denies any weakness in his left arm or any other parts of his body. Since the event he's not been confused but has continued to have intermittent tingling of his left arm.  Review Of Systems: Per HPI, Otherwise 12 point review of systems was performed and was unremarkable.  Patient Active Problem List   Diagnosis Date Noted  . GI bleed 08/24/2014  . Internal nasal lesion 06/04/2014  . Knee pain 05/19/2014  . Opioid withdrawal 05/19/2014  . Allergy to bee sting 04/08/2014  . Anxiety associated with depression 04/08/2014  . Restless leg syndrome 10/22/2013  . Chronic back pain 05/01/2013  . Tobacco abuse 09/11/2012  . COPD (chronic obstructive pulmonary disease) 09/11/2012  . Low back pain radiating to left leg 08/10/2012  . CERVICAL RADICULOPATHY 12/07/2010  . HYPERTENSION  10/26/2010  . ALLERGIC RHINITIS 10/26/2010  . KNEE PAIN, LEFT 10/26/2010   Past Medical History: Past Medical History  Diagnosis Date  . Hypertension   . COPD (chronic obstructive pulmonary disease)   . Bronchitis     hx of  . Asthma   . Concussion 1983  . Headache(784.0)   . Anxiety   . Depression   . Umbilical hernia   . GERD  (gastroesophageal reflux disease)     takes tums/rolaids  . Arthritis   . Head trauma   . 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  Secondary to eye surgery   Past Surgical History: Past Surgical History  Procedure Laterality Date  . Knee surgery    . Thumb surgery (left)    . Umbilical hernia repair  05/15/2012    Procedure: HERNIA REPAIR UMBILICAL ADULT;  Surgeon: Clovis Pu. Cornett, MD;  Location: [facility] OR;  Service: General;  Laterality: N/A;  . Hernia repair  05/2012    umbilical hernia  . Eye surgery  20 years ago    Metal foreign body removed from Right eye   Social History: History  Substance Use Topics  . Smoking status: Current Every Day Smoker -- 0.75 packs/day for 20 years    Types: Cigarettes  . Smokeless tobacco: Never Used  . Alcohol Use: No     Comment: rarely  - quit approx. June 2013   Additional social history: Please also refer to relevant sections of EMR.  Family History: Family History  Problem Relation Age of Onset  . Diabetes Mother   . Diabetes Father   . Heart failure Brother    Allergies and Medications: Allergies  Allergen Reactions  . Bee Venom Anaphylaxis   No current facility-administered medications on file prior to encounter.   Current Outpatient Prescriptions on File Prior to Encounter  Medication Sig Dispense Refill  . albuterol (PROVENTIL HFA;VENTOLIN HFA) 108 (90 BASE) MCG/ACT inhaler Inhale 2 puffs into the lungs every 6 (six) hours as needed for wheezing.    . citalopram (CELEXA) 20 MG tablet Take 20 mg by mouth daily.    Marland Kitchen EPINEPHrine 0.3 mg/0.3 mL IJ SOAJ injection Inject 0.3 mLs (0.3 mg total) into the muscle once. 1 Device 0  . lisinopril-hydrochlorothiazide (PRINZIDE,ZESTORETIC) 20-25 MG per tablet Take 1 tablet by mouth every morning. 90 tablet 2  . loratadine (CLARITIN) 10 MG tablet Take 1 tablet (10 mg total) by mouth every morning. 30 tablet 11  . LORazepam (ATIVAN) 0.5 MG tablet Take 0.5 mg by mouth at bedtime.    .  traMADol (ULTRAM) 50 MG tablet Take 1 tablet (50 mg total) by mouth at bedtime. 30 tablet 1  . citalopram (CELEXA) 20 MG tablet TAKE ONE TABLET BY MOUTH DAILY 30 tablet 6  . mupirocin nasal ointment (BACTROBAN) 2 % Apply in each nostril daily (Patient not taking: Reported on 08/24/2014) 1 g 0  . oxyCODONE-acetaminophen (PERCOCET/ROXICET) 5-325 MG per tablet Take 1 tablet by mouth every 8 (eight) hours. Do not fill for 30 days (Patient not taking: Reported on 08/24/2014) 90 tablet 0  . oxyCODONE-acetaminophen (ROXICET) 5-325 MG per tablet Take 1 tablet by mouth every 8 (eight) hours as needed for severe pain. Do not fill for 60 days (Patient not taking: Reported on 08/24/2014) 90 tablet 0  . oxyCODONE-acetaminophen (ROXICET) 5-325 MG per tablet Take 1 tablet by mouth every 8 (eight) hours as needed for severe pain. (Patient not taking: Reported on 08/24/2014) 90 tablet  0  . sulfamethoxazole-trimethoprim (BACTRIM DS) 800-160 MG per tablet Take 1 tablet by mouth 2 (two) times daily. X 7 days (Patient not taking: Reported on 08/24/2014) 14 tablet 0    Objective: BP 109/61 mmHg  Pulse 114  Temp(Src) 97.9 F (36.6 C) (Oral)  Resp 18  SpO2 96% Exam: Gen: NAD, alert, cooperative with exam HEENT: NCAT, MMM CV: RRR, good S1/S2, no murmur Resp: Diffuse expiratory wheezes in all lung fields, nonlabored Abd: Soft, slight tenderness to palpation throughout, no guarding, no masses palpated Ext: No edema, warm , 2+ DP pulse Neuro: Alert and oriented, strength 5/5 in bilateral upper and lower extremities, grossly normal sensation  Labs and Imaging: CBC BMET   Recent Labs Lab 08/24/14 1154  WBC 13.5*  HGB 12.4*  HCT 37.2*  PLT 270    Recent Labs Lab 08/24/14 1154  NA 137  K 4.3  CL 100  CO2 23  BUN 40*  CREATININE 0.77  GLUCOSE 135*  CALCIUM 9.1     Baseline hemoglobin 15  Troponin negative 1  Urinalysis negative for nitrites, leukocytes, normal specific gravity  EKG 08/24/2014:  Sinus tachycardia  Elenora GammaSamuel L Bradshaw, MD 08/24/2014, 6:58 PM PGY-3, [employer] Family Medicine FPTS Intern pager: [phone], text pages welcome

## 2014-08-24 NOTE — ED Notes (Signed)
Bed: RESB Expected date:  Expected time:  Means of arrival:  Comments: Vomiting large amt of blood

## 2014-08-24 NOTE — ED Notes (Signed)
Report called to Dara at [facility]. [facility] on their way.

## 2014-08-24 NOTE — ED Notes (Addendum)
Lupita Leash (fiance) number is [phone]  Mother Darel Hong) [phone]

## 2014-08-24 NOTE — ED Notes (Signed)
Urinal provided.

## 2014-08-24 NOTE — Progress Notes (Signed)
Was called in to see pt because of his intentions to leave AMA. Have explained to pt the possible repercussions of leaving against medical advice. He was transferred here from [facility] for hematemesis and is scheduled for EGD in am. I have explained that he could bleed out at home. Dr. Ermalinda Memos paged and made aware of situation.

## 2014-08-24 NOTE — ED Provider Notes (Addendum)
CSN: [id number]     Arrival date & time 08/24/14  1138 History   First MD Initiated Contact with Patient 08/24/14 1151     Chief Complaint  Patient presents with  . Hematemesis      HPI  Patient presents with concern of nausea, vomiting. Patient notes that he ate food at a flea market yesterday.  Approximately 8 hours ago the patient developed nausea, had several episodes of hematemesis.  There is associated headache, abdominal discomfort, though no focal abdominal pain. He notes that during the profound phase of symptoms he was diaphoretic, lightheaded, but these have resolved. Currently, the patient has no abdominal pain.  Patient denies history of substantial medical problems. He does acknowledge a history of chronic pain.  Patient smokes. I discussed smoking cessation with the patient.   Past Medical History  Diagnosis Date  . Hypertension   . COPD (chronic obstructive pulmonary disease)   . Bronchitis     hx of  . Asthma   . Concussion 1983  . Headache(784.0)   . Anxiety   . Depression   . Umbilical hernia   . GERD (gastroesophageal reflux disease)     takes tums/rolaids  . Arthritis   . Head trauma   . 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 Knee surgery    . Thumb surgery (left)    . Umbilical hernia repair  05/15/2012    Procedure: HERNIA REPAIR UMBILICAL ADULT;  Surgeon: Clovis Puhomas A. Cornett, MD;  Location: [facility] OR;  Service: General;  Laterality: N/A;  . Hernia repair  05/2012    umbilical hernia  . Eye surgery  20 years ago    Metal foreign body removed from Right eye   Family History  Problem Relation Age of Onset  . Diabetes Mother   . Diabetes Father   . Heart failure Brother    History  Substance Use Topics  . Smoking status: Current Every Day Smoker -- 0.75 packs/day for 20 years    Types: Cigarettes  . Smokeless tobacco: Never Used  . Alcohol Use: No      Comment: rarely  - quit approx. June 2013    Review of Systems  Constitutional:       Per HPI, otherwise negative  HENT:       Per HPI, otherwise negative  Respiratory:       Per HPI, otherwise negative  Cardiovascular:       Per HPI, otherwise negative  Gastrointestinal: Positive for vomiting and abdominal pain.  Endocrine:       Negative aside from HPI  Genitourinary:       Neg aside from HPI   Musculoskeletal:       Per HPI, otherwise negative  Skin: Negative.   Neurological: Negative for syncope.      Allergies  Bee venom  Home Medications   Prior to Admission medications   Medication Sig Start Date End Date Taking? Authorizing Provider  albuterol (PROVENTIL HFA;VENTOLIN HFA) 108 (90 BASE) MCG/ACT inhaler Inhale 2 puffs into the lungs every 6 (six) hours as needed for wheezing.   Yes Historical Provider, MD  citalopram (CELEXA) 20 MG tablet Take 20 mg by mouth daily.   Yes Historical Provider, MD  EPINEPHrine 0.3 mg/0.3 mL IJ SOAJ injection Inject 0.3 mLs (0.3 mg total) into the muscle once. 03/13/14  Yes Jimmye Normanavid John Smith, NP  lisinopril-hydrochlorothiazide (  PRINZIDE,ZESTORETIC) 20-25 MG per tablet Take 1 tablet by mouth every morning. 04/08/14  Yes Tobey Grim, MD  loratadine (CLARITIN) 10 MG tablet Take 1 tablet (10 mg total) by mouth every morning. 04/08/14  Yes Tobey Grim, MD  LORazepam (ATIVAN) 0.5 MG tablet Take 0.5 mg by mouth at bedtime.   Yes Historical Provider, MD  oxyCODONE-acetaminophen (PERCOCET/ROXICET) 5-325 MG per tablet Take 1 tablet by mouth 3 (three) times daily.   Yes Historical Provider, MD  traMADol (ULTRAM) 50 MG tablet Take 1 tablet (50 mg total) by mouth at bedtime. 08/22/14  Yes Tobey Grim, MD  citalopram (CELEXA) 20 MG tablet TAKE ONE TABLET BY MOUTH DAILY 07/30/14   Tobey Grim, MD  mupirocin nasal ointment (BACTROBAN) 2 % Apply in each nostril daily Patient not taking: Reported on 08/24/2014 06/03/14   Tobey Grim, MD   oxyCODONE-acetaminophen (PERCOCET/ROXICET) 5-325 MG per tablet Take 1 tablet by mouth every 8 (eight) hours. Do not fill for 30 days Patient not taking: Reported on 08/24/2014 06/03/14   Tobey Grim, MD  oxyCODONE-acetaminophen (ROXICET) 5-325 MG per tablet Take 1 tablet by mouth every 8 (eight) hours as needed for severe pain. Do not fill for 60 days Patient not taking: Reported on 08/24/2014 06/03/14   Tobey Grim, MD  oxyCODONE-acetaminophen (ROXICET) 5-325 MG per tablet Take 1 tablet by mouth every 8 (eight) hours as needed for severe pain. Patient not taking: Reported on 08/24/2014 06/03/14   Tobey Grim, MD  sulfamethoxazole-trimethoprim (BACTRIM DS) 800-160 MG per tablet Take 1 tablet by mouth 2 (two) times daily. X 7 days Patient not taking: Reported on 08/24/2014 06/03/14   Tobey Grim, MD   BP 104/69 mmHg  Pulse 120  Temp(Src) 97.6 F (36.4 C) (Oral)  Resp 25  SpO2 95% Physical Exam  Constitutional: He is oriented to person, place, and time. He appears well-developed. No distress.  Large male resting, seemingly comfortably  HENT:  Head: Normocephalic and atraumatic.  Eyes: Conjunctivae and EOM are normal.  Cardiovascular: Regular rhythm.  Tachycardia present.   Pulmonary/Chest: Effort normal. No stridor. No respiratory distress.  Abdominal: He exhibits no distension.  Musculoskeletal: He exhibits no edema.  Neurological: He is alert and oriented to person, place, and time.  Skin: Skin is warm and dry.  Psychiatric: He has a normal mood and affect.  Nursing note and vitals reviewed.   ED Course  Procedures (including critical care time) Labs Review Labs Reviewed  CBC WITH DIFFERENTIAL - Abnormal; Notable for the following:    WBC 13.5 (*)    RBC 4.07 (*)    Hemoglobin 12.4 (*)    HCT 37.2 (*)    Neutro Abs 9.2 (*)    All other components within normal limits  COMPREHENSIVE METABOLIC PANEL - Abnormal; Notable for the following:    Glucose, Bld 135 (*)     BUN 40 (*)    All other components within normal limits  LIPASE, BLOOD - Abnormal; Notable for the following:    Lipase 10 (*)    All other components within normal limits  URINALYSIS, ROUTINE W REFLEX MICROSCOPIC  I-STAT TROPOININ, ED   Labs notable for 3 g hemoglobin drop.   Cardiac 121 sinus tach abnormal Pulse oximetry 97% room air normal Blood pressure 100/65, patient started on IV fluids soon after the initial evaluation.  Update: Patient remains tachycardic after the initial evaluation. IV fluids running. No additional vomiting thus far.   EKG  with sinus tachycardia, rate 120, hypertrophic changes, abnormal   Update: Patient still tachycardic, second liter IV fluids running. Patient denies abdominal pain still.  He does endorse mild generalized discomfort. Given hematemesis, possible bloody stool, patient was started on pantoprazole bolus, drip.  Update: I discussed patient's case with his primary care team and GI (Dr. Dickie La).  MDM  Patient presents with concern of multiple episodes of hematemesis, possible bloody stool.  Patient has no abdominal pain, but does appear uncomfortable, diaphoretic. Patient is neurologically intact, but hemodynamically, the patient has tachycardia, hypotension, and given his 3 g hemoglobin drop, some concern for the amount of blood loss.  Patient was started on a pantoprazole drip after receiving initial bolus.  Patient received 2 L IV fluid resuscitation followed by continuous fluids given the persistent hypotension, tachycardia.  Patient's vomiting, bowel movements stopped, and he had no additional episodes in the emergency department.  After discussion with gastroenterology, patient was transferred to our affiliated facility for further evaluation and management.  CRITICAL CARE Performed by: Gerhard Munch Total critical care time: 35 Critical care time was exclusive of separately billable procedures and treating other  patients. Critical care was necessary to treat or prevent imminent or life-threatening deterioration. Critical care was time spent personally by me on the following activities: development of treatment plan with patient and/or surrogate as well as nursing, discussions with consultants, evaluation of patient's response to treatment, examination of patient, obtaining history from patient or surrogate, ordering and performing treatments and interventions, ordering and review of laboratory studies, ordering and review of radiographic studies, pulse oximetry and re-evaluation of patient's condition.   Gerhard Munch, MD 08/24/14 1516  Gerhard Munch, MD 08/24/14 [phone]

## 2014-08-24 NOTE — ED Notes (Signed)
Visitor at bedside.

## 2014-08-24 NOTE — Progress Notes (Signed)
Interval note  Called to bedside by Rn due to patient wanting to leave AMA. I explained that it was very unsafe for him to go home with a large GI bleed this am. He states he is just aggravated and is missing his fiance Lupita Leash. He is unsure if he can handle staying but states that he will take his pain meds and try to go to sleep.   He may still leave AMA, i feel that I and the RN have explained the risks to him at length. I also believe he has decision making capacity despite my disagreeing with his decision if he goes home.   I am available for further discussion if he would like to.   Murtis Sink, MD [facility] Family Medicine Resident, PGY-3 08/24/2014, 9:10 PM

## 2014-08-24 NOTE — ED Notes (Signed)
[facility] at bedside 

## 2014-08-24 NOTE — ED Notes (Addendum)
Pt from home via [facility] c/o hematemesis since 0300. He has a headache, back pain, abdominal pain. Pt is diaphoretic.

## 2014-08-24 NOTE — Progress Notes (Signed)
I have been asked to see Trevor Thomas, for hematemesis, he is currently being transferred from [facility] ED to [facility] fo Family practice Service, we will see him  At [facility]. Please keep NPO after MN for possible EGD tomorrow.

## 2014-08-24 NOTE — ED Notes (Signed)
Let patient know that they needed to give a urine sample. Unable at this time

## 2014-08-24 NOTE — ED Notes (Signed)
Pt aware of the need for a urine sample. 

## 2014-08-24 NOTE — ED Notes (Signed)
MD at bedside. 

## 2014-08-25 ENCOUNTER — Encounter (HOSPITAL_COMMUNITY)
Admission: EM | Disposition: A | Discharge status: Home / Self Care | Payer: Self-pay | Source: Home / Self Care | Attending: Family Medicine

## 2014-08-25 ENCOUNTER — Inpatient Hospital Stay (HOSPITAL_COMMUNITY): Payer: Self-pay

## 2014-08-25 ENCOUNTER — Encounter (HOSPITAL_COMMUNITY): Payer: Self-pay | Admitting: Certified Registered Nurse Anesthetist

## 2014-08-25 ENCOUNTER — Encounter (HOSPITAL_COMMUNITY): Payer: Self-pay | Admitting: Gastroenterology

## 2014-08-25 DIAGNOSIS — K922 Gastrointestinal hemorrhage, unspecified: Secondary | ICD-10-CM | POA: Insufficient documentation

## 2014-08-25 DIAGNOSIS — I639 Cerebral infarction, unspecified: Secondary | ICD-10-CM

## 2014-08-25 DIAGNOSIS — K250 Acute gastric ulcer with hemorrhage: Secondary | ICD-10-CM

## 2014-08-25 DIAGNOSIS — K254 Chronic or unspecified gastric ulcer with hemorrhage: Secondary | ICD-10-CM

## 2014-08-25 DIAGNOSIS — I10 Essential (primary) hypertension: Secondary | ICD-10-CM

## 2014-08-25 HISTORY — PX: ESOPHAGOGASTRODUODENOSCOPY: SHX5428

## 2014-08-25 LAB — BASIC METABOLIC PANEL
ANION GAP: 12 (ref 5–15)
BUN: 28 mg/dL — ABNORMAL HIGH (ref 6–23)
CALCIUM: 8.4 mg/dL (ref 8.4–10.5)
CHLORIDE: 105 meq/L (ref 96–112)
CO2: 22 mEq/L (ref 19–32)
Creatinine, Ser: 0.66 mg/dL (ref 0.50–1.35)
GFR calc Af Amer: >90 mL/min (ref >90)
GFR calc non Af Amer: >90 mL/min (ref >90)
GLUCOSE: 89 mg/dL (ref 70–99)
POTASSIUM: 3.7 meq/L (ref 3.7–5.3)
SODIUM: 139 meq/L (ref 137–147)

## 2014-08-25 LAB — HEMOGLOBIN AND HEMATOCRIT, BLOOD
HEMATOCRIT: 25.5 % — AB (ref 39.0–52.0)
Hemoglobin: 8.7 g/dL — ABNORMAL LOW (ref 13.0–17.0)

## 2014-08-25 LAB — CBC
HEMATOCRIT: 28.4 % — AB (ref 39.0–52.0)
HEMOGLOBIN: 9.4 g/dL — AB (ref 13.0–17.0)
MCH: 29.7 pg (ref 26.0–34.0)
MCHC: 33.1 g/dL (ref 30.0–36.0)
MCV: 89.6 fL (ref 78.0–100.0)
Platelets: 218 10*3/uL (ref 150–400)
RBC: 3.17 MIL/uL — AB (ref 4.22–5.81)
RDW: 12.7 % (ref 11.5–15.5)
WBC: 7.5 10*3/uL (ref 4.0–10.5)

## 2014-08-25 SURGERY — EGD (ESOPHAGOGASTRODUODENOSCOPY)
Anesthesia: Moderate Sedation

## 2014-08-25 SURGERY — EGD (ESOPHAGOGASTRODUODENOSCOPY)
Anesthesia: Monitor Anesthesia Care

## 2014-08-25 MED ORDER — PANTOPRAZOLE SODIUM 40 MG PO TBEC: 40.0000 mg | DELAYED_RELEASE_TABLET | Freq: Two times a day (BID) | ORAL | Status: DC

## 2014-08-25 MED ORDER — DIPHENHYDRAMINE HCL 50 MG/ML IJ SOLN
INTRAMUSCULAR | Status: DC | PRN
  Administered 2014-08-25: 50 mg via INTRAVENOUS

## 2014-08-25 MED ORDER — BUTAMBEN-TETRACAINE-BENZOCAINE 2-2-14 % EX AERO
INHALATION_SPRAY | CUTANEOUS | Status: DC | PRN
  Administered 2014-08-25: 2 via TOPICAL

## 2014-08-25 MED ORDER — MIDAZOLAM HCL 5 MG/ML IJ SOLN
INTRAMUSCULAR | Status: AC
  Filled 2014-08-25: qty 2

## 2014-08-25 MED ORDER — FENTANYL CITRATE 0.05 MG/ML IJ SOLN
INTRAMUSCULAR | Status: DC | PRN
  Administered 2014-08-25 (×4): 25 ug via INTRAVENOUS

## 2014-08-25 MED ORDER — FENTANYL CITRATE 0.05 MG/ML IJ SOLN
INTRAMUSCULAR | Status: AC
  Filled 2014-08-25: qty 2

## 2014-08-25 MED ORDER — DIPHENHYDRAMINE HCL 50 MG/ML IJ SOLN
INTRAMUSCULAR | Status: AC
  Filled 2014-08-25: qty 1

## 2014-08-25 MED ORDER — MIDAZOLAM HCL 5 MG/5ML IJ SOLN
INTRAMUSCULAR | Status: DC | PRN
  Administered 2014-08-25 (×6): 2 mg via INTRAVENOUS

## 2014-08-25 NOTE — Progress Notes (Signed)
Family Medicine Teaching Service Daily Progress Note Intern Pager: [phone]  Patient name: Chevas Colyer Medical record number: [id number] Date of birth: Oct 20, 1965 Age: 48 y.o. Gender: male  Primary Care Provider: Renold Don, MD Consultants: GI Code Status: Full  Assessment and Plan: Muadh Mynatt is a 48 y.o. male presenting with upper GI bleed . PMH is significant for hypertension, chronic knee/back/neck pain, COPD, restless leg syndrome, and anxiety.  Upper GI bleed, azotemia - no recurrence of bleeding - Hgb down to 9.4, baseline 15 - hemodynamically stable - Continue IV Protonix drip until after EGD - IV fluids: Normal saline at 125 mL/hr, bolus as needed - GI consulted by ED - I appreciate their recommendations - EGD this am around 11 am - trend CBC  Hypertension - Relative hypotension on presentation to the ED, now normotensive after 2 L of NS bolus - Hold home antihypertensives including lisinopril and HCTZ  COPD - Increased wheezing and slightly increased cough compared to baseline - Monitor closely, but I don't believe this is an exacerbation - Continue PRN albuterol  Chronic pain - At baseline - Continue home narcotics and tramadol  Anxiety-  - Continue Celexa and Ativan per home dose   FEN/GI: NS at 125 mL/hr, NPO Prophylaxis: Protonix drip, no anticoagulation with GI bleed   Disposition: Continue step down for now, would monitor overnight depending on EGD findings  Subjective:  No more hematemesis, no hematochezia. Would really like to go home if everything is ok with the EGD.   Reports worseing of the numbness and tingling in his LUE, Feels slightly weaker as well.   Objective: Temp:  [97.6 F (36.4 C)-98.9 F (37.2 C)] 98 F (36.7 C) (11/23 0900) Pulse Rate:  [88-120] 89 (11/23 0900) Resp:  [18-26] 26 (11/22 1935) BP: (99-124)/(57-89) 116/68 mmHg (11/23 0900) SpO2:  [93 %-100 %] 98 % (11/23 0900) Weight:  [224 lb 10.4 oz (101.9  kg)] 224 lb 10.4 oz (101.9 kg) (11/22 1935) Physical Exam: Gen: NAD, alert, cooperative with exam HEENT: NCAT, MMM CV: RRR, good S1/S2, no murmur Resp: Diffuse expiratory wheezes in all lung fields, nonlabored Abd: Soft, slight tenderness to palpation throughout, no guarding, no masses palpated Ext: No edema, warm , 2+ DP pulse Neuro: Alert and oriented, strength 5/5 in bilateral upper and lower extremities, grossly normal sensation, possibly slightly decreased strength in LUE compared to RUE  Laboratory:  Recent Labs Lab 08/24/14 1154 08/25/14 0324  WBC 13.5* 7.5  HGB 12.4* 9.4*  HCT 37.2* 28.4*  PLT 270 218    Recent Labs Lab 08/24/14 1154 08/25/14 0324  NA 137 139  K 4.3 3.7  CL 100 105  CO2 23 22  BUN 40* 28*  CREATININE 0.77 0.66  CALCIUM 9.1 8.4  PROT 6.3  --   BILITOT 0.4  --   ALKPHOS 74  --   ALT 12  --   AST 14  --   GLUCOSE 135* 89      Elenora Gamma, MD 08/25/2014, 9:50 AM PGY-3, [place] [facility] FPTS Intern pager: [phone], text pages welcome

## 2014-08-25 NOTE — Progress Notes (Signed)
Pt came back from CT. Pt drowsy and asleep. Vitals within normal limits. Pt refuses SCD since he came to hospital. Pt was in ENDO prior to CT and had endoscopy and was sedated therefore sleepy. Wife at bedside. Will monitor.

## 2014-08-25 NOTE — Op Note (Signed)
[facility] [address] [place], [address]   ENDOSCOPY PROCEDURE REPORT  PATIENT: Trevor Thomas, Trevor Thomas  MR#: [id number] BIRTHDATE: 15-Oct-1965 , 48  yrs. old GENDER: male ENDOSCOPIST: Louis Meckel, MD REFERRED BY: PROCEDURE DATE:  08/25/2014 PROCEDURE:  EGD w/ biopsy ASA CLASS:     Class II INDICATIONS:  hematemesis. MEDICATIONS: Benadryl 50 mg IV, Fentanyl 100 mcg IV, and Versed 10 mg IV TOPICAL ANESTHETIC: Cetacaine Spray  DESCRIPTION OF PROCEDURE: After the risks benefits and alternatives of the procedure were thoroughly explained, informed consent was obtained.  The Pentax Gastroscope [id number] endoscope was introduced through the mouth and advanced to the second portion of the duodenum , Without limitations.  The instrument was slowly withdrawn as the mucosa was fully examined.    DUODENUM: Mild duodenal inflammation was found in the duodenal bulb. Except for the findings listed the EGD was otherwise normal. STOMACH: A single non-bleeding, clean-based and deep ulcer measuring 5 x 60mm in size was found in the gastric body.  Biopsies were taken at edge of the ulcer.  Retroflexed views revealed no abnormalities.     The scope was then withdrawn from the patient and the procedure completed.  COMPLICATIONS: There were no immediate complications.  ENDOSCOPIC IMPRESSION: 1.  active gastric ulcer 2.   duodenitis  RECOMMENDATIONS: twice a day PPI therapy for 2 weeks and then daily Avoid ASA and NSAIDs  REPEAT EXAM:  eSigned:  Louis Meckel, MD 08/25/2014 3:14 PM    CC:

## 2014-08-25 NOTE — Progress Notes (Signed)
Endoscopy demonstrated an active gastric ulcer and mild duodenitis.  Ulcer is clean--based.  Risk for rebleeding is low.  Recommendations #1 twice a day PPI therapy for 2 weeks and then daily #2 avoid NSAIDs and ASA #3 okay to DC within the next 24 hours if he remains stable

## 2014-08-25 NOTE — Progress Notes (Signed)
Utilization Review Completed.  

## 2014-08-25 NOTE — Plan of Care (Signed)
Problem: Phase I Progression Outcomes Goal: Pain controlled with appropriate interventions Outcome: Progressing Goal: OOB as tolerated unless otherwise ordered Outcome: Progressing Goal: Initial discharge plan identified Outcome: Progressing Goal: Voiding-avoid urinary catheter unless indicated Outcome: Completed/Met Date Met:  08/25/14 Goal: Hemodynamically stable Outcome: Progressing     
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
## 2014-08-25 NOTE — Discharge Instructions (Signed)
You were admitted for a bleeding stomach ulcer. You will need to take a medicine called protonix to help it heal. You should take this twice a day for 2 weeks and then switch to once a day. Do not use ibuprofen, alleve, motrin or aspirin as this will increase the risk of more bleeding.  The tingling in your arm may be coming from a stroke that was caused by losing so much blood. The CT scan did not show this but if you continue having trouble with that arm or you have weakness in your leg, slurred speech or a droopy face, please see a doctor as soon as possible for an MRI and further evaluation.

## 2014-08-25 NOTE — Progress Notes (Signed)
Pt just left floor to go to Endoscopy and then will go to CT for a scan following. Pt is alert and oriented. Pt in no distress when he left with Endo nurses. Pt has been NPO except for meds and couple sips of water and nurse informed Endo nurse.

## 2014-08-26 ENCOUNTER — Telehealth: Payer: Self-pay | Admitting: *Deleted

## 2014-08-26 ENCOUNTER — Encounter (HOSPITAL_COMMUNITY): Payer: Self-pay | Admitting: Gastroenterology

## 2014-08-26 NOTE — Progress Notes (Signed)
LM for patient to call back.  Will try calling him again later. Jazmin Hartsell,CMA

## 2014-08-26 NOTE — Telephone Encounter (Signed)
-----   Message from Janit Pagan, MD sent at 08/26/2014  1:35 PM EST ----- Please call to inform patient, his biopsy from last hospital admission showed H.Pylori which he might have had in the past. Have in discuss the result further with his PCP during his follow up visit.

## 2014-08-26 NOTE — Telephone Encounter (Signed)
Pt is aware of this. Trevor Thomas,CMA  

## 2014-08-26 NOTE — Discharge Summary (Signed)
[facility]  Patient name: Trevor CoupeRicky Thomas Medical record number: [id number] Date of birth: 05/29/1966 Age: 48 y.o. Gender: male Date of Admission: 08/24/2014  Date of Discharge: 08/25/2014  Admitting Physician: Janit PaganKehinde Eniola, MD  Primary Care Provider: Renold DonWALDEN,JEFF, MD Consultants: GI  Indication for Hospitalization: Upper GI bleed  Discharge Diagnoses/Problem List:  Upper GI Bleed Left Upper extremity numbness and tingling HTN COPD Chronic pain Anxiety  Disposition: Home  Discharge Condition: Stable  Brief Hospital Course:  Trevor Thomas is a 48 y.o. male presenting with upper GI bleed . PMH is significant for hypertension, chronic knee/back/neck pain, COPD, restless leg syndrome, and anxiety.  Upper GI bleed He was admitted after 2 episodes of hematemesis and one episode of hematochezia. He had clinical signs of volume contraction with relatively soft blood pressures and tachycardia which responded well to fluids. His hemoglobin at baseline is 15 and was 12.5 on presentation to the ER. After volume resuscitation his hemoglobin was 8.7. He was seen by GI and EGD was performed showing a gastric ulcer and mild duodenitis which was reported to have a low risk of rebleeding per GI recommendations. He did not have any additional episodes of hematemesis or hematochezia after admission and he was discharged per his wishes after the EGD. He was sent home on twice daily PPI which should be continued for 2 weeks and then switched to daily.  Left upper extremity numbness He had left upper extremity numbness with some slight weakness developing during his episodes of hematemesis. There was some concern for watershed injury. A CT head was performed and showed no acute processes. He was very adamant about going home so he was discharged. We did offer a full stroke workup and would recommend considering stroke workup outpatient. Flags for CVA were  reviewed in detail.  HTN He had relative hypotension which normalized throughout admission. His antihypertensives were held and restarted on discharge.  COPD Throughout admission he had some extra for wheezes. He is not felt to be having an acute exacerbation but he was given albuterol when necessary. He was given nicotine patch for tobacco abuse.  Chronic pain His home narcotic dose was continued throughout admission.  Anxiety Her admission he did have an anxious affect with very restless legs appear at times. His home Celexa and Ativan were continued. He was given 1 additional dose of Ativan, as he requested this, on the night of admission.  Issues for Follow Up:  - Consider OP stroke work up and neuro referral, CT head was negative here however he has left arm symptoms that could be indicative of a watershed event, he declined further workup here. - followup CBC after significant GI bleed, should continue twice daily PPI for 2 weeks and then go to once daily.  Significant Procedures: EGD 08/25/2014  Significant Labs and Imaging:   Recent Labs Lab 08/24/14 1154 08/25/14 0324 08/25/14 1634  WBC 13.5* 7.5  --   HGB 12.4* 9.4* 8.7*  HCT 37.2* 28.4* 25.5*  PLT 270 218  --     Recent Labs Lab 08/24/14 1154 08/25/14 0324  NA 137 139  K 4.3 3.7  CL 100 105  CO2 23 22  GLUCOSE 135* 89  BUN 40* 28*  CREATININE 0.77 0.66  CALCIUM 9.1 8.4  ALKPHOS 74  --   AST 14  --   ALT 12  --   ALBUMIN 3.5  --       Results/Tests Pending at Time of Discharge:  None  Discharge Medications:    Medication List    STOP taking these medications        oxyCODONE-acetaminophen 5-325 MG per tablet  Commonly known as:  PERCOCET/ROXICET     sulfamethoxazole-trimethoprim 800-160 MG per tablet  Commonly known as:  BACTRIM DS      TAKE these medications        albuterol 108 (90 BASE) MCG/ACT inhaler  Commonly known as:  PROVENTIL HFA;VENTOLIN HFA  Inhale 2 puffs into the lungs  every 6 (six) hours as needed for wheezing.     citalopram 20 MG tablet  Commonly known as:  CELEXA  Take 20 mg by mouth daily.     citalopram 20 MG tablet  Commonly known as:  CELEXA  TAKE ONE TABLET BY MOUTH DAILY     EPINEPHrine 0.3 mg/0.3 mL Soaj injection  Commonly known as:  EPI-PEN  Inject 0.3 mLs (0.3 mg total) into the muscle once.     lisinopril-hydrochlorothiazide 20-25 MG per tablet  Commonly known as:  PRINZIDE,ZESTORETIC  Take 1 tablet by mouth every morning.     loratadine 10 MG tablet  Commonly known as:  CLARITIN  Take 1 tablet (10 mg total) by mouth every morning.     LORazepam 0.5 MG tablet  Commonly known as:  ATIVAN  Take 0.5 mg by mouth at bedtime.     mupirocin nasal ointment 2 %  Commonly known as:  BACTROBAN  Apply in each nostril daily     pantoprazole 40 MG tablet  Commonly known as:  PROTONIX  Take 1 tablet (40 mg total) by mouth 2 (two) times daily. After two weeks, decrease to 1 time a day     traMADol 50 MG tablet  Commonly known as:  ULTRAM  Take 1 tablet (50 mg total) by mouth at bedtime.        Discharge Instructions: Please refer to Patient Instructions section of EMR for full details.  Patient was counseled important signs and symptoms that should prompt return to medical care, changes in medications, dietary instructions, activity restrictions, and follow up appointments.   Follow-Up Appointments:     Follow-up Information    Follow up with [facility], MD On 09/02/2014.   Specialty:  Family Medicine   Why:  9:30am appointment for follow-up   Contact information:   [address] [place] [address] [phone]       Elenora Gamma, MD 08/26/2014, 8:19 AM PGY-3, [facility]

## 2014-08-26 NOTE — Telephone Encounter (Signed)
LM for patient to call back. Jazmin Hartsell,CMA  

## 2014-09-02 ENCOUNTER — Encounter: Payer: Self-pay | Admitting: Family Medicine

## 2014-09-02 ENCOUNTER — Ambulatory Visit (INDEPENDENT_AMBULATORY_CARE_PROVIDER_SITE_OTHER): Payer: No Typology Code available for payment source | Admitting: Family Medicine

## 2014-09-02 VITALS — BP 141/72 | HR 102 | Temp 98.5°F | Ht 71.0 in | Wt 249.7 lb

## 2014-09-02 DIAGNOSIS — D509 Iron deficiency anemia, unspecified: Secondary | ICD-10-CM

## 2014-09-02 DIAGNOSIS — G2581 Restless legs syndrome: Secondary | ICD-10-CM

## 2014-09-02 DIAGNOSIS — M79605 Pain in left leg: Secondary | ICD-10-CM

## 2014-09-02 DIAGNOSIS — J449 Chronic obstructive pulmonary disease, unspecified: Secondary | ICD-10-CM

## 2014-09-02 DIAGNOSIS — R200 Anesthesia of skin: Secondary | ICD-10-CM

## 2014-09-02 DIAGNOSIS — M5412 Radiculopathy, cervical region: Secondary | ICD-10-CM

## 2014-09-02 DIAGNOSIS — K254 Chronic or unspecified gastric ulcer with hemorrhage: Secondary | ICD-10-CM

## 2014-09-02 DIAGNOSIS — M545 Low back pain, unspecified: Secondary | ICD-10-CM

## 2014-09-02 DIAGNOSIS — R202 Paresthesia of skin: Secondary | ICD-10-CM

## 2014-09-02 HISTORY — DX: Iron deficiency anemia, unspecified: D50.9

## 2014-09-02 LAB — POCT HEMOGLOBIN: HEMOGLOBIN: 9.1 g/dL — AB (ref 14.1–18.1)

## 2014-09-02 MED ORDER — ALBUTEROL SULFATE HFA 108 (90 BASE) MCG/ACT IN AERS: 2.0000 | INHALATION_SPRAY | Freq: Four times a day (QID) | RESPIRATORY_TRACT | Status: DC | PRN

## 2014-09-02 MED ORDER — LORAZEPAM 0.5 MG PO TABS: 0.5000 mg | ORAL_TABLET | Freq: Every day | ORAL | Status: DC

## 2014-09-02 MED ORDER — IPRATROPIUM BROMIDE 0.02 % IN SOLN
0.5000 mg | Freq: Once | RESPIRATORY_TRACT | Status: AC
  Administered 2014-09-02: 0.5 mg via RESPIRATORY_TRACT

## 2014-09-02 MED ORDER — CLARITHROMYCIN 500 MG PO TABS: 500.0000 mg | ORAL_TABLET | Freq: Two times a day (BID) | ORAL | Status: DC

## 2014-09-02 MED ORDER — OXYCODONE-ACETAMINOPHEN 5-325 MG PO TABS: 1.0000 | ORAL_TABLET | Freq: Three times a day (TID) | ORAL | Status: DC | PRN

## 2014-09-02 MED ORDER — OMEPRAZOLE 40 MG PO CPDR: DELAYED_RELEASE_CAPSULE | ORAL | Status: DC

## 2014-09-02 MED ORDER — FERROUS SULFATE 325 (65 FE) MG PO TABS: 325.0000 mg | ORAL_TABLET | Freq: Every day | ORAL | Status: DC

## 2014-09-02 MED ORDER — AMOXICILLIN 500 MG PO CAPS: 1000.0000 mg | ORAL_CAPSULE | Freq: Two times a day (BID) | ORAL | Status: DC

## 2014-09-02 MED ORDER — ALBUTEROL SULFATE (2.5 MG/3ML) 0.083% IN NEBU
2.5000 mg | INHALATION_SOLUTION | Freq: Once | RESPIRATORY_TRACT | Status: AC
  Administered 2014-09-02: 2.5 mg via RESPIRATORY_TRACT

## 2014-09-02 NOTE — Progress Notes (Signed)
Subjective:    ** Needs Omeprazole plus triple therapy plus albuterol  Trevor Thomas is a [age] y.o. male who [facility] today for hospital FU:  1.  Hematemesis:  Admitted for this 11/23.  Had endoscopy which revealed Active gastric ulcer and duodenitis.  STarted on BID PPI for 2 weeks then daily afterwards.  Scheduled to end on Dec 8. Recommended to stop ASA/NSAIDs.  Biopsy results taken -- revealed H. Pylori.  Has not been able to afford PPI since leaving hospital, not prescribed triple therapy.    2.  Left upper extremity numbness:  Also had numbness and tingling in Left arm which started roughly same time as hematemesis.  Told by EMS he "may have had stroke."  CT head negative.  Refused MRI while in-house.    Since DC, he worsening tingling and anxiety attack.  Taken to hospital in [place], admitted x 2 days.  Had reportedly negative Echocardiogram and MRI which showed some "narrowing in neck but no stroke."  Do not have these actual reports.  Tingling in Left arm starts at neck and radiates to dorsum of 2nd and 3rd digits.  Worse when turns head.  Has not tried anything for relief.    [facility].  Has had 1 episode of regurgitation with "specks of blood" but no hematemesis since leaving hospital.  Has SOB with mild walking.  No changes in weight.  Does have some lightheadedness with standing/orthostasis.  No falls.  Denies chest pain/palpitations.   Wife describes restless leg symptoms at night time.   ROS as above per HPI, otherwise neg.   The following portions of the patient's history were reviewed and updated as appropriate: allergies, current medications, past medical history, family and social history, and problem list. Patient is a smoker.    PMH reviewed.  Past Medical History  Diagnosis Date  . Hypertension   . COPD (chronic obstructive pulmonary disease)   . Bronchitis     hx of  . Asthma   . Concussion 1983  . Headache(784.0)   . Anxiety    . Depression   . Umbilical hernia   . GERD (gastroesophageal reflux disease)     takes tums/rolaids  . Arthritis   . Head trauma   . 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 Knee surgery    . Thumb surgery (left)    . Umbilical hernia repair  05/15/2012    Procedure: HERNIA REPAIR UMBILICAL ADULT;  Surgeon: Clovis Puhomas A. Cornett, MD;  Location: [facility] OR;  Service: General;  Laterality: N/A;  . Hernia repair  05/2012    umbilical hernia  . Eye surgery  20 years ago    Metal foreign body removed from Right eye  . Esophagogastroduodenoscopy N/A 08/25/2014    Procedure: ESOPHAGOGASTRODUODENOSCOPY (EGD);  Surgeon: Louis Meckelobert D Kaplan, MD;  [facility];  Service: Endoscopy;  Laterality: N/A;    Medications reviewed. Current Outpatient Prescriptions  Medication Sig Dispense Refill  . albuterol (PROVENTIL HFA;VENTOLIN HFA) 108 (90 BASE) MCG/ACT inhaler Inhale 2 puffs into the lungs every 6 (six) hours as needed for wheezing.    . citalopram (CELEXA) 20 MG tablet Take 20 mg by mouth daily.    . citalopram (CELEXA) 20 MG tablet TAKE ONE TABLET BY MOUTH DAILY 30 tablet 6  . EPINEPHrine 0.3 mg/0.3 mL IJ SOAJ injection Inject 0.3 mLs (0.3  mg total) into the muscle once. 1 Device 0  . lisinopril-hydrochlorothiazide (PRINZIDE,ZESTORETIC) 20-25 MG per tablet Take 1 tablet by mouth every morning. 90 tablet 2  . loratadine (CLARITIN) 10 MG tablet Take 1 tablet (10 mg total) by mouth every morning. 30 tablet 11  . LORazepam (ATIVAN) 0.5 MG tablet Take 0.5 mg by mouth at bedtime.    . mupirocin nasal ointment (BACTROBAN) 2 % Apply in each nostril daily (Patient not taking: Reported on 08/24/2014) 1 g 0  . pantoprazole (PROTONIX) 40 MG tablet Take 1 tablet (40 mg total) by mouth 2 (two) times daily. After two weeks, decrease to 1 time a day 45 tablet 1  . traMADol (ULTRAM) 50 MG tablet Take 1 tablet (50 mg total) by  mouth at bedtime. 30 tablet 1   No current facility-administered medications for this visit.     Objective:   Physical Exam BP 141/72 mmHg  Pulse 102  Temp(Src) 98.5 F (36.9 C) (Oral)  Ht 5\' 11"  (1.803 m)  Wt 249 lb 11.2 oz (113.263 kg)  BMI 34.84 kg/m2 Gen:  Alert, cooperative patient who appears stated age in no acute distress.  Vital signs reviewed.  Pallor noted HEENT: EOMI,  MMM.  Conjunctival pallor noted.  Neck:  Supple Cardiac: Tachycardic but regular rhythm Pulm:  Scattered wheezing and rhonchi at bases.     Abd:  Soft/nondistended/nontender.  .  Exts: No edema Psych:  Calm, not anxious appearing.  Neuro:  Strength 5/5 BL upper and lower extremities.  DTRs intact.  Moves all extremities equally.  No numbness of Left UE on my exam today.  Spurling's positive.  Reproduction of symptoms of tingling in arm with turning of head to the Right.   MSK:  TTP along Left paracervical muscles.  Spasm noted.   No results found for this or any previous visit (from the past 72 hour(s)).

## 2014-09-02 NOTE — Patient Instructions (Addendum)
Your iron is still low.  You need to start taking iron pills.  This is the reason you are restless at night and having trouble breathing.   Start taking the iron pills twice a day for the next several day.  If you can increase to three times a day by this weekend, that is the best.  You MUST start taking a stool softener like Colace with this.  You will get constipated with this and the pain medicine.    Start taking the Omeprazole.  You need an antibiotic to wipe out the bacteria that is causing the ulcer.  We will call you where to pick this up.    Pain med refill today.   Take the Ativan at night to help with relaxation and sleep.  We can probably stop this once your iron is better.

## 2014-09-02 NOTE — Assessment & Plan Note (Signed)
Refill for chronic pain medications today.  Able to achieve ADLs because of this.   Pain is controlled with Percocet.

## 2014-09-02 NOTE — Assessment & Plan Note (Addendum)
Omeprazole today as this is on $4 list. Needs triple therapy to fully resolve ulcer and treat h pylori.   Sent to Health dept.  Will see if this is filled under the [employer].

## 2014-09-02 NOTE — Assessment & Plan Note (Signed)
Acute worsening secondary to iron deficiency. Starting iron replacement today.

## 2014-09-02 NOTE — Assessment & Plan Note (Addendum)
Secondary to GI bleed from ulcer.   Cause of most of his symptoms, including dyspnea and restless leg symptoms. Spot Hgb was 9 here.  Need CBC to confirm. Concerned for slow active bleed.  To start PPI today. Also to start Iron today and increase to TID asap.  Red flags/warning precautions given. He is to FU with me in 2 weeks to assure this is improving.

## 2014-09-03 ENCOUNTER — Telehealth: Payer: Self-pay | Admitting: Family Medicine

## 2014-09-03 LAB — COMPREHENSIVE METABOLIC PANEL
ALT: 11 U/L (ref 0–53)
AST: 12 U/L (ref 0–37)
Albumin: 4 g/dL (ref 3.5–5.2)
Alkaline Phosphatase: 77 U/L (ref 39–117)
BUN: 11 mg/dL (ref 6–23)
CALCIUM: 9.3 mg/dL (ref 8.4–10.5)
CHLORIDE: 99 meq/L (ref 96–112)
CO2: 29 mEq/L (ref 19–32)
Creat: 0.68 mg/dL (ref 0.50–1.35)
Glucose, Bld: 94 mg/dL (ref 70–99)
POTASSIUM: 4.3 meq/L (ref 3.5–5.3)
SODIUM: 136 meq/L (ref 135–145)
Total Bilirubin: 0.2 mg/dL (ref 0.2–1.2)
Total Protein: 6.3 g/dL (ref 6.0–8.3)

## 2014-09-03 LAB — CBC WITH DIFFERENTIAL/PLATELET
BASOS ABS: 0 10*3/uL (ref 0.0–0.1)
BASOS PCT: 0 % (ref 0–1)
EOS ABS: 0.2 10*3/uL (ref 0.0–0.7)
EOS PCT: 2 % (ref 0–5)
HCT: 32 % — ABNORMAL LOW (ref 39.0–52.0)
HEMOGLOBIN: 10.5 g/dL — AB (ref 13.0–17.0)
Lymphocytes Relative: 34 % (ref 12–46)
Lymphs Abs: 2.6 10*3/uL (ref 0.7–4.0)
MCH: 30 pg (ref 26.0–34.0)
MCHC: 32.8 g/dL (ref 30.0–36.0)
MCV: 91.4 fL (ref 78.0–100.0)
MPV: 9.8 fL (ref 9.4–12.4)
Monocytes Absolute: 0.5 10*3/uL (ref 0.1–1.0)
Monocytes Relative: 7 % (ref 3–12)
NEUTROS PCT: 57 % (ref 43–77)
Neutro Abs: 4.3 10*3/uL (ref 1.7–7.7)
Platelets: 435 10*3/uL — ABNORMAL HIGH (ref 150–400)
RBC: 3.5 MIL/uL — ABNORMAL LOW (ref 4.22–5.81)
RDW: 14.1 % (ref 11.5–15.5)
WBC: 7.6 10*3/uL (ref 4.0–10.5)

## 2014-09-03 NOTE — Telephone Encounter (Signed)
[employer].  Describes "painful bump at top of my crack."  Sounds like boil vs pilonidal cyst.  Recommended this will probably need to be drained.  We also discussed treatment for H pylori sent in to Health Dept.  He would like to see if these abx will help with boil before drainage.  Recommended to FU sooner if worsening.  Also discussed that I sent in Albuterol refill to HD for him as this should be covered by [employer].

## 2014-09-03 NOTE — Assessment & Plan Note (Addendum)
Reproducible today on examation with movement ofhead. Not stroke. Secondary to likely pinched cervical nerve. Need records from outside hospital. Neck stretches/PT prescribed today.  FU next week to assess for improvement.

## 2014-09-03 NOTE — Assessment & Plan Note (Signed)
See tingling below.

## 2014-09-03 NOTE — Telephone Encounter (Signed)
Pt called and wanted to speak to Huntley Dec concerning a bump that has come up on his bottom since his visit yesterday. jw

## 2014-09-03 NOTE — Assessment & Plan Note (Signed)
Acute dyspnea with some wheezing today. Worsened by acute anemia Breathign treatment of Albuterol/Atrovent today in clinic.  Pulm exam s/p treatment was much improved with only very minimal wheezing.   Refilled patient's home albuterol.  Total of 50 minutes spent in patient care, with >35 minutes face time to coordinate

## 2014-09-15 ENCOUNTER — Telehealth: Payer: Self-pay | Admitting: Family Medicine

## 2014-09-15 NOTE — Telephone Encounter (Signed)
Returned called to [employer].  Lupita Leash stated that pt is really sick and want to see Dr. Gwendolyn Grant.  He has not been sleeping, nor has family.  He does not want to go back to hospital.  Wanted to see Dr. Gwendolyn Grant tomorrow, informed her that Dr. Gwendolyn Grant schedule was completely booked.  Per Dr. Gwendolyn Grant will see pt on Wednesday at 1:30 PM.  Lupita Leash was informed of appt.  They agreed to wait until Wednesday.  Clovis Pu, RN

## 2014-09-15 NOTE — Telephone Encounter (Signed)
Pt is very agitated, depressed, hurting, doing the same things he was doing before he went to the hospital, Says he cant wait until Thursday Wants an emergiency work in appt [phone]

## 2014-09-16 NOTE — Telephone Encounter (Signed)
Will see patient at 1:30.

## 2014-09-17 ENCOUNTER — Other Ambulatory Visit: Payer: Self-pay | Admitting: Family Medicine

## 2014-09-17 ENCOUNTER — Ambulatory Visit (INDEPENDENT_AMBULATORY_CARE_PROVIDER_SITE_OTHER): Payer: No Typology Code available for payment source | Admitting: Family Medicine

## 2014-09-17 DIAGNOSIS — K274 Chronic or unspecified peptic ulcer, site unspecified, with hemorrhage: Secondary | ICD-10-CM

## 2014-09-17 DIAGNOSIS — M549 Dorsalgia, unspecified: Secondary | ICD-10-CM

## 2014-09-17 DIAGNOSIS — D62 Acute posthemorrhagic anemia: Secondary | ICD-10-CM

## 2014-09-17 DIAGNOSIS — G8929 Other chronic pain: Secondary | ICD-10-CM

## 2014-09-17 LAB — POCT HEMOGLOBIN: Hemoglobin: 11.7 g/dL — AB (ref 14.1–18.1)

## 2014-09-17 MED ORDER — TRAMADOL HCL 50 MG PO TABS: ORAL_TABLET | ORAL | Status: DC

## 2014-09-17 MED ORDER — BACLOFEN 10 MG PO TABS: 10.0000 mg | ORAL_TABLET | Freq: Three times a day (TID) | ORAL | Status: DC

## 2014-09-17 MED ORDER — CLONIDINE HCL 0.1 MG PO TABS: 0.1000 mg | ORAL_TABLET | Freq: Three times a day (TID) | ORAL | Status: DC

## 2014-09-17 NOTE — Progress Notes (Signed)
Subjective:    Trevor Thomas is a 48 y.o. male who presents to [facility] todayy:  1.  Anemia:  Repeat Hgb better today.  Taking iron.  Still with some restless leg symptoms.  Dyspnea greatly improved.  2.  Back pain:  Main issue he wants to discuss today.  Girlfriend present today.  Discusses that she and his mother are "very worried" about him.  He takes 2-3 of his Percocet at a time, which is only way for him to gain relief.  He runs out early.  He doesn't want to return to care being out of his medicines out of shame. "Deals with" the pain for the rest of the month -- but girlfriend and mother state he's not doing well with this.  Quick to anger, diarrhea, chills, easily crying.  Back pain limits him from doing anything beyond his ADLs.  Can't even shop for pain.  No bladder/bowel incontinence.  No saddle anesthesia.     Has been on same dose of Percocet for about the past 5 years.    ROS as above per HPI, otherwise neg.    The following portions of the patient's history were reviewed and updated as appropriate: allergies, current medications, past medical history, family and social history, and problem list. Patient is a nonsmoker.    PMH reviewed.  Past Medical History  Diagnosis Date  . Hypertension   . COPD (chronic obstructive pulmonary disease)   . Bronchitis     hx of  . Asthma   . Concussion 1983  . Headache(784.0)   . Anxiety   . Depression   . Umbilical hernia   . GERD (gastroesophageal reflux disease)     takes tums/rolaids  . Arthritis   . Head trauma   . 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 Knee surgery    . Thumb surgery (left)    . Umbilical hernia repair  05/15/2012    Procedure: HERNIA REPAIR UMBILICAL ADULT;  Surgeon: Clovis Puhomas A. Cornett, MD;  Location: [facility] OR;  Service: General;  Laterality: N/A;  . Hernia repair  05/2012    umbilical hernia  . Eye surgery  20 years ago   Metal foreign body removed from Right eye  . Esophagogastroduodenoscopy N/A 08/25/2014    Procedure: ESOPHAGOGASTRODUODENOSCOPY (EGD);  Surgeon: Louis Meckelobert D Kaplan, MD;  [facility];  Service: Endoscopy;  Laterality: N/A;    Medications reviewed. Current Outpatient Prescriptions  Medication Sig Dispense Refill  . albuterol (PROVENTIL HFA;VENTOLIN HFA) 108 (90 BASE) MCG/ACT inhaler Inhale 2 puffs into the lungs every 6 (six) hours as needed for wheezing. 8 g 1  . amoxicillin (AMOXIL) 500 MG capsule Take 2 capsules (1,000 mg total) by mouth 2 (two) times daily. X 10 days 40 capsule 0  . citalopram (CELEXA) 20 MG tablet Take 20 mg by mouth daily.    . citalopram (CELEXA) 20 MG tablet TAKE ONE TABLET BY MOUTH DAILY 30 tablet 6  . clarithromycin (BIAXIN) 500 MG tablet Take 1 tablet (500 mg total) by mouth 2 (two) times daily. 20 tablet 0  . EPINEPHrine 0.3 mg/0.3 mL IJ SOAJ injection Inject 0.3 mLs (0.3 mg total) into the muscle once. 1 Device 0  . ferrous sulfate 325 (65 FE) MG tablet Take 1 tablet (325 mg total) by mouth daily with breakfast. 90 tablet 3  . lisinopril-hydrochlorothiazide (PRINZIDE,ZESTORETIC) 20-25 MG per tablet Take  1 tablet by mouth every morning. 90 tablet 2  . loratadine (CLARITIN) 10 MG tablet Take 1 tablet (10 mg total) by mouth every morning. 30 tablet 11  . LORazepam (ATIVAN) 0.5 MG tablet Take 1 tablet (0.5 mg total) by mouth at bedtime. 30 tablet 1  . mupirocin nasal ointment (BACTROBAN) 2 % Apply in each nostril daily (Patient not taking: Reported on 08/24/2014) 1 g 0  . omeprazole (PRILOSEC) 40 MG capsule Take 2 tabs daily x 2 weeks, then 1 tab po daily after that 60 capsule 1  . oxyCODONE-acetaminophen (ROXICET) 5-325 MG per tablet Take 1 tablet by mouth every 8 (eight) hours as needed for severe pain. 90 tablet 0  . oxyCODONE-acetaminophen (ROXICET) 5-325 MG per tablet Take 1 tablet by mouth every 8 (eight) hours as needed for severe pain. DO not fill for 30  days 90 tablet 0  . oxyCODONE-acetaminophen (ROXICET) 5-325 MG per tablet Take 1 tablet by mouth every 8 (eight) hours as needed for severe pain. Do not fill for 60 days 90 tablet 0  . pantoprazole (PROTONIX) 40 MG tablet Take 1 tablet (40 mg total) by mouth 2 (two) times daily. After two weeks, decrease to 1 time a day 45 tablet 1  . traMADol (ULTRAM) 50 MG tablet Take 1 tablet (50 mg total) by mouth at bedtime. 30 tablet 1   No current facility-administered medications for this visit.     Objective:   Physical Exam There were no vitals taken for this visit. Gen:  Alert, cooperative patient who appears stated age in no acute distress.  Vital signs reviewed.  Teary at times.  Chills appear on arms   No results found for this or any previous visit (from the past 72 hour(s)).

## 2014-09-17 NOTE — Patient Instructions (Addendum)
You are withdrawing from the Percocet.  That is the reason for the cramps, chills, and other symptoms you're having.  I want you to take the Clonidine twice daily (in the AM and PM).  Take the Baclofen for muscle cramps, up to three times a day.  Take the Lorazepam 1/2 pill in AM and PM.    Call me tomorrow afternoon to let me know how you're doing.

## 2014-09-18 ENCOUNTER — Telehealth: Payer: Self-pay | Admitting: Family Medicine

## 2014-09-18 ENCOUNTER — Ambulatory Visit: Payer: No Typology Code available for payment source | Admitting: Family Medicine

## 2014-09-18 NOTE — Telephone Encounter (Signed)
Lupita Leash cook called again about calling pt. Wants dr Gwendolyn Grant to call

## 2014-09-18 NOTE — Telephone Encounter (Signed)
The patient called, returning Dr. Tyson Alias call.  He requested a call back tonight, if possible.  CB#: [phone]

## 2014-09-18 NOTE — Telephone Encounter (Signed)
Tried calling Sy tonight to check in with him.  Apologized it was so late. Had to leave message.  Asked him to call me back in AM.

## 2014-09-18 NOTE — Telephone Encounter (Signed)
Reminder for dr Gwendolyn Grant call pt Per mom

## 2014-09-19 MED ORDER — OXYCODONE-ACETAMINOPHEN 10-325 MG PO TABS
1.0000 | ORAL_TABLET | Freq: Three times a day (TID) | ORAL | Status: DC | PRN
Start: 2014-09-19 — End: 2014-10-16

## 2014-09-19 NOTE — Telephone Encounter (Signed)
Trevor Thomas attempted self-withdrawal at home.  He is not handling this very well.  Cold chills, crying spells, inability to sleep.  He would "rather die" than go through this, though denies any suicidal ideations.    Was previously taking all 3 Percocets at once to obtain relief, and running out early.  Will split difference and prescribe 10 mg.    FU with me next week to assess for improvement.

## 2014-09-19 NOTE — Telephone Encounter (Signed)
Please address

## 2014-09-19 NOTE — Telephone Encounter (Signed)
pts mom says Dr. Gwendolyn Grant was having Huntley Dec check with the outpt pharmacy to see if pt can fill his medicine there. Checking status to see if this can be done.

## 2014-09-20 NOTE — Telephone Encounter (Signed)
Please ask patient (if he calls back) and his family members to NOT call [facility] for Family Medicine questions.  I called from the [facility] line on Thursday night, but told him to call back the [facility] line [phone]).  He should NOT be calling staff at [facility] for these questions.  I have addressed this with him on the phone on Friday in another phone encounter.

## 2014-09-20 NOTE — Assessment & Plan Note (Addendum)
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 Last dose was 2 days ago. DIscussed w/d symptoms. He would like trial of medications to help with through w/d and see if he can stop Percocet for good. Attempt Baclofen, clonidine, refill of lorazepam.  Discussed indications for each  Poor candidate for going through w/d symptoms on his own due to somewhat dependent personality -- but will attempt.  Encouraged outpt vs inpt detox, but he declined. He will call tomorrow and let me know how he is doing.    Warned about risk of liver damage from taking extra Percocet due to TYlenol component of the medication  **Addendum:  Patient gave the prescriptions for his next 2 months of Percocet dated from 12/1.  I shredded this.

## 2014-09-20 NOTE — Assessment & Plan Note (Signed)
Improving.   Still recommend colonoscopy, which he elects to defer.

## 2014-10-15 ENCOUNTER — Telehealth: Payer: Self-pay | Admitting: Family Medicine

## 2014-10-15 NOTE — Telephone Encounter (Signed)
Mr. Lundholm need his pain medication filled before leaving town this weekend.  Have an appt on 1/26 with provider.  Please call him to advise if he can pick up tomorrow.

## 2014-10-15 NOTE — Telephone Encounter (Signed)
Precepting tomorrow AM.  Can fill then.

## 2014-10-16 ENCOUNTER — Encounter (HOSPITAL_COMMUNITY): Payer: Self-pay | Admitting: Gastroenterology

## 2014-10-16 MED ORDER — OXYCODONE-ACETAMINOPHEN 10-325 MG PO TABS: 1.0000 | ORAL_TABLET | Freq: Three times a day (TID) | ORAL | Status: DC | PRN

## 2014-10-16 NOTE — Telephone Encounter (Signed)
Fill today. Want to see him in clinic before next refill.

## 2014-10-16 NOTE — Telephone Encounter (Signed)
Spoke with patient and informed him I have sat rx up front for pick up

## 2014-10-16 NOTE — Telephone Encounter (Signed)
Pt wants to talk with dr Gwendolyn Grant or sara---wouldn't say why

## 2014-10-20 ENCOUNTER — Emergency Department (HOSPITAL_COMMUNITY)
Admission: EM | Admit: 2014-10-20 | Discharge: 2014-10-20 | Disposition: A | Discharge status: Home / Self Care | Payer: No Typology Code available for payment source | Attending: Emergency Medicine | Admitting: Emergency Medicine

## 2014-10-20 ENCOUNTER — Encounter (HOSPITAL_COMMUNITY): Payer: Self-pay | Admitting: Emergency Medicine

## 2014-10-20 ENCOUNTER — Emergency Department (HOSPITAL_COMMUNITY): Payer: MEDICAID

## 2014-10-20 DIAGNOSIS — I10 Essential (primary) hypertension: Secondary | ICD-10-CM | POA: Insufficient documentation

## 2014-10-20 DIAGNOSIS — Y998 Other external cause status: Secondary | ICD-10-CM | POA: Insufficient documentation

## 2014-10-20 DIAGNOSIS — K219 Gastro-esophageal reflux disease without esophagitis: Secondary | ICD-10-CM | POA: Insufficient documentation

## 2014-10-20 DIAGNOSIS — M1990 Unspecified osteoarthritis, unspecified site: Secondary | ICD-10-CM | POA: Insufficient documentation

## 2014-10-20 DIAGNOSIS — Z720 Tobacco use: Secondary | ICD-10-CM | POA: Insufficient documentation

## 2014-10-20 DIAGNOSIS — Z87828 Personal history of other (healed) physical injury and trauma: Secondary | ICD-10-CM | POA: Insufficient documentation

## 2014-10-20 DIAGNOSIS — F329 Major depressive disorder, single episode, unspecified: Secondary | ICD-10-CM | POA: Insufficient documentation

## 2014-10-20 DIAGNOSIS — R1011 Right upper quadrant pain: Secondary | ICD-10-CM

## 2014-10-20 DIAGNOSIS — Y9289 Other specified places as the place of occurrence of the external cause: Secondary | ICD-10-CM | POA: Insufficient documentation

## 2014-10-20 DIAGNOSIS — Z792 Long term (current) use of antibiotics: Secondary | ICD-10-CM | POA: Insufficient documentation

## 2014-10-20 DIAGNOSIS — W19XXXA Unspecified fall, initial encounter: Secondary | ICD-10-CM

## 2014-10-20 DIAGNOSIS — J449 Chronic obstructive pulmonary disease, unspecified: Secondary | ICD-10-CM | POA: Insufficient documentation

## 2014-10-20 DIAGNOSIS — F419 Anxiety disorder, unspecified: Secondary | ICD-10-CM | POA: Insufficient documentation

## 2014-10-20 DIAGNOSIS — Y9301 Activity, walking, marching and hiking: Secondary | ICD-10-CM | POA: Insufficient documentation

## 2014-10-20 DIAGNOSIS — W1839XA Other fall on same level, initial encounter: Secondary | ICD-10-CM | POA: Insufficient documentation

## 2014-10-20 DIAGNOSIS — Z79899 Other long term (current) drug therapy: Secondary | ICD-10-CM | POA: Insufficient documentation

## 2014-10-20 DIAGNOSIS — S3991XA Unspecified injury of abdomen, initial encounter: Secondary | ICD-10-CM | POA: Insufficient documentation

## 2014-10-20 LAB — CBC WITH DIFFERENTIAL/PLATELET
BASOS ABS: 0 10*3/uL (ref 0.0–0.1)
Basophils Relative: 0 % (ref 0–1)
Eosinophils Absolute: 0.2 10*3/uL (ref 0.0–0.7)
Eosinophils Relative: 3 % (ref 0–5)
HEMATOCRIT: 42 % (ref 39.0–52.0)
Hemoglobin: 13.8 g/dL (ref 13.0–17.0)
Lymphocytes Relative: 37 % (ref 12–46)
Lymphs Abs: 2.5 10*3/uL (ref 0.7–4.0)
MCH: 29.2 pg (ref 26.0–34.0)
MCHC: 32.9 g/dL (ref 30.0–36.0)
MCV: 88.8 fL (ref 78.0–100.0)
Monocytes Absolute: 0.4 10*3/uL (ref 0.1–1.0)
Monocytes Relative: 6 % (ref 3–12)
Neutro Abs: 3.7 10*3/uL (ref 1.7–7.7)
Neutrophils Relative %: 54 % (ref 43–77)
PLATELETS: 237 10*3/uL (ref 150–400)
RBC: 4.73 MIL/uL (ref 4.22–5.81)
RDW: 13.1 % (ref 11.5–15.5)
WBC: 6.8 10*3/uL (ref 4.0–10.5)

## 2014-10-20 LAB — COMPREHENSIVE METABOLIC PANEL
ALT: 15 U/L (ref 0–53)
AST: 22 U/L (ref 0–37)
Albumin: 3.7 g/dL (ref 3.5–5.2)
Alkaline Phosphatase: 88 U/L (ref 39–117)
Anion gap: 7 (ref 5–15)
BILIRUBIN TOTAL: 0.5 mg/dL (ref 0.3–1.2)
BUN: 9 mg/dL (ref 6–23)
CHLORIDE: 97 meq/L (ref 96–112)
CO2: 30 mmol/L (ref 19–32)
CREATININE: 0.86 mg/dL (ref 0.50–1.35)
Calcium: 9.3 mg/dL (ref 8.4–10.5)
GLUCOSE: 143 mg/dL — AB (ref 70–99)
Potassium: 3.6 mmol/L (ref 3.5–5.1)
Sodium: 134 mmol/L — ABNORMAL LOW (ref 135–145)
Total Protein: 6.6 g/dL (ref 6.0–8.3)

## 2014-10-20 LAB — URINALYSIS, ROUTINE W REFLEX MICROSCOPIC
Bilirubin Urine: NEGATIVE
GLUCOSE, UA: NEGATIVE mg/dL
Hgb urine dipstick: NEGATIVE
KETONES UR: NEGATIVE mg/dL
Leukocytes, UA: NEGATIVE
Nitrite: NEGATIVE
Protein, ur: NEGATIVE mg/dL
SPECIFIC GRAVITY, URINE: 1.034 — AB (ref 1.005–1.030)
UROBILINOGEN UA: 0.2 mg/dL (ref 0.0–1.0)
pH: 6.5 (ref 5.0–8.0)

## 2014-10-20 MED ORDER — OXYCODONE-ACETAMINOPHEN 5-325 MG PO TABS
1.0000 | ORAL_TABLET | Freq: Once | ORAL | Status: AC
  Administered 2014-10-20: 1 via ORAL
  Filled 2014-10-20: qty 1

## 2014-10-20 MED ORDER — SODIUM CHLORIDE 0.9 % IV BOLUS (SEPSIS)
1000.0000 mL | Freq: Once | INTRAVENOUS | Status: AC
  Administered 2014-10-20: 1000 mL via INTRAVENOUS

## 2014-10-20 MED ORDER — HYDROMORPHONE HCL 1 MG/ML IJ SOLN
1.0000 mg | Freq: Once | INTRAMUSCULAR | Status: AC
  Administered 2014-10-20: 1 mg via INTRAVENOUS
  Filled 2014-10-20: qty 1

## 2014-10-20 MED ORDER — IOHEXOL 300 MG/ML  SOLN
100.0000 mL | Freq: Once | INTRAMUSCULAR | Status: AC | PRN
Start: 2014-10-20 — End: 2014-10-20
  Administered 2014-10-20: 100 mL via INTRAVENOUS
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
## 2014-10-20 NOTE — Discharge Instructions (Signed)
As discussed, your evaluation today has been largely reassuring.  But, it is important that you monitor your condition carefully, and do not hesitate to return to the ED if you develop new, or concerning changes in your condition. ? ?Otherwise, please follow-up with your physician for appropriate ongoing care. ? ?

## 2014-10-20 NOTE — ED Notes (Signed)
Called CT about patient finishing his contrast.
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
## 2014-10-20 NOTE — ED Notes (Signed)
Patient returned from CT

## 2014-10-26 ENCOUNTER — Encounter (HOSPITAL_COMMUNITY): Payer: Self-pay | Admitting: *Deleted

## 2014-10-26 ENCOUNTER — Emergency Department (HOSPITAL_COMMUNITY)
Admission: EM | Admit: 2014-10-26 | Discharge: 2014-10-26 | Disposition: A | Discharge status: Home / Self Care | Payer: MEDICAID | Attending: Emergency Medicine | Admitting: Emergency Medicine

## 2014-10-26 DIAGNOSIS — I10 Essential (primary) hypertension: Secondary | ICD-10-CM | POA: Insufficient documentation

## 2014-10-26 DIAGNOSIS — Z8669 Personal history of other diseases of the nervous system and sense organs: Secondary | ICD-10-CM | POA: Insufficient documentation

## 2014-10-26 DIAGNOSIS — F329 Major depressive disorder, single episode, unspecified: Secondary | ICD-10-CM | POA: Insufficient documentation

## 2014-10-26 DIAGNOSIS — K921 Melena: Secondary | ICD-10-CM | POA: Insufficient documentation

## 2014-10-26 DIAGNOSIS — Z79899 Other long term (current) drug therapy: Secondary | ICD-10-CM | POA: Insufficient documentation

## 2014-10-26 DIAGNOSIS — J449 Chronic obstructive pulmonary disease, unspecified: Secondary | ICD-10-CM | POA: Insufficient documentation

## 2014-10-26 DIAGNOSIS — F419 Anxiety disorder, unspecified: Secondary | ICD-10-CM | POA: Insufficient documentation

## 2014-10-26 DIAGNOSIS — L0231 Cutaneous abscess of buttock: Secondary | ICD-10-CM | POA: Insufficient documentation

## 2014-10-26 DIAGNOSIS — Z720 Tobacco use: Secondary | ICD-10-CM | POA: Insufficient documentation

## 2014-10-26 DIAGNOSIS — K219 Gastro-esophageal reflux disease without esophagitis: Secondary | ICD-10-CM | POA: Insufficient documentation

## 2014-10-26 DIAGNOSIS — M1990 Unspecified osteoarthritis, unspecified site: Secondary | ICD-10-CM | POA: Insufficient documentation

## 2014-10-26 DIAGNOSIS — L0291 Cutaneous abscess, unspecified: Secondary | ICD-10-CM

## 2014-10-26 DIAGNOSIS — Z87828 Personal history of other (healed) physical injury and trauma: Secondary | ICD-10-CM | POA: Insufficient documentation

## 2014-10-26 LAB — COMPREHENSIVE METABOLIC PANEL
ALT: 18 U/L (ref 0–53)
AST: 21 U/L (ref 0–37)
Albumin: 3.8 g/dL (ref 3.5–5.2)
Alkaline Phosphatase: 88 U/L (ref 39–117)
Anion gap: 5 (ref 5–15)
BUN: 15 mg/dL (ref 6–23)
CO2: 29 mmol/L (ref 19–32)
CREATININE: 0.8 mg/dL (ref 0.50–1.35)
Calcium: 9.3 mg/dL (ref 8.4–10.5)
Chloride: 103 mmol/L (ref 96–112)
GFR calc Af Amer: >90 mL/min (ref >90)
GFR calc non Af Amer: >90 mL/min (ref >90)
GLUCOSE: 117 mg/dL — AB (ref 70–99)
Potassium: 3.9 mmol/L (ref 3.5–5.1)
Sodium: 137 mmol/L (ref 135–145)
Total Bilirubin: 0.4 mg/dL (ref 0.3–1.2)
Total Protein: 7 g/dL (ref 6.0–8.3)

## 2014-10-26 LAB — CBC WITH DIFFERENTIAL/PLATELET
Basophils Absolute: 0 10*3/uL (ref 0.0–0.1)
Basophils Relative: 0 % (ref 0–1)
Eosinophils Absolute: 0.1 10*3/uL (ref 0.0–0.7)
Eosinophils Relative: 1 % (ref 0–5)
HCT: 45.5 % (ref 39.0–52.0)
HEMOGLOBIN: 14.9 g/dL (ref 13.0–17.0)
Lymphocytes Relative: 35 % (ref 12–46)
Lymphs Abs: 2.6 10*3/uL (ref 0.7–4.0)
MCH: 28.6 pg (ref 26.0–34.0)
MCHC: 32.7 g/dL (ref 30.0–36.0)
MCV: 87.3 fL (ref 78.0–100.0)
Monocytes Absolute: 0.5 10*3/uL (ref 0.1–1.0)
Monocytes Relative: 6 % (ref 3–12)
NEUTROS ABS: 4.4 10*3/uL (ref 1.7–7.7)
Neutrophils Relative %: 58 % (ref 43–77)
Platelets: 294 10*3/uL (ref 150–400)
RBC: 5.21 MIL/uL (ref 4.22–5.81)
RDW: 13.3 % (ref 11.5–15.5)
WBC: 7.7 10*3/uL (ref 4.0–10.5)

## 2014-10-26 LAB — LIPASE, BLOOD: LIPASE: 24 U/L (ref 11–59)

## 2014-10-26 MED ORDER — HYDROMORPHONE HCL 1 MG/ML IJ SOLN
1.0000 mg | Freq: Once | INTRAMUSCULAR | Status: AC
  Administered 2014-10-26: 1 mg via INTRAVENOUS
  Filled 2014-10-26: qty 1

## 2014-10-26 MED ORDER — LIDOCAINE-EPINEPHRINE 2 %-1:100000 IJ SOLN
20.0000 mL | Freq: Once | INTRAMUSCULAR | Status: AC
  Administered 2014-10-26: 5 mL
  Filled 2014-10-26: qty 20

## 2014-10-26 MED ORDER — SULFAMETHOXAZOLE-TRIMETHOPRIM 800-160 MG PO TABS: 1.0000 | ORAL_TABLET | Freq: Two times a day (BID) | ORAL | Status: DC

## 2014-10-26 NOTE — ED Provider Notes (Signed)
CSN: [id number]     Arrival date & time 10/26/14  1554 History   First MD Initiated Contact with Patient 10/26/14 1829     Chief Complaint  Patient presents with  . Blood In Stools  . Abdominal Pain     (Consider location/radiation/quality/duration/timing/severity/associated sxs/prior Treatment) Patient is a 49 y.o. male presenting with abdominal pain.  Abdominal Pain Pain location:  RUQ Pain quality: aching   Pain radiates to:  Does not radiate Pain severity:  Moderate Onset quality:  Gradual Duration:  2 days Timing:  Constant Progression:  Unchanged Chronicity:  Recurrent Relieved by:  Nothing Worsened by:  Movement and palpation Associated symptoms: chills, constipation and hematochezia   Associated symptoms: no cough, no diarrhea, no dysuria, no fever, no hematuria, no melena, no nausea, no shortness of breath and no vomiting     Past Medical History  Diagnosis Date  . Hypertension   . COPD (chronic obstructive pulmonary disease)   . Bronchitis     hx of  . Asthma   . Concussion 1983  . Headache(784.0)   . Anxiety   . Depression   . Umbilical hernia   . GERD (gastroesophageal reflux disease)     takes tums/rolaids  . Arthritis   . Head trauma   . 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 Knee surgery    . Thumb surgery (left)    . Umbilical hernia repair  05/15/2012    Procedure: HERNIA REPAIR UMBILICAL ADULT;  Surgeon: Clovis Pu. Cornett, MD;  Location: [facility] OR;  Service: General;  Laterality: N/A;  . Hernia repair  05/2012    umbilical hernia  . Eye surgery  20 years ago    Metal foreign body removed from Right eye  . Esophagogastroduodenoscopy N/A 08/25/2014    Procedure: ESOPHAGOGASTRODUODENOSCOPY (EGD);  Surgeon: Louis Meckel, MD;  Location: [facility] ENDOSCOPY;  Service: Endoscopy;  Laterality: N/A;   Family History  Problem Relation Age of Onset  . Diabetes Mother   .  Diabetes Father   . Heart failure Brother    History  Substance Use Topics  . Smoking status: Current Every Day Smoker -- 0.75 packs/day for 20 years    Types: Cigarettes  . Smokeless tobacco: Never Used  . Alcohol Use: No     Comment: rarely  - quit approx. June 2013    Review of Systems  Constitutional: Positive for chills. Negative for fever.  Respiratory: Negative for cough and shortness of breath.   Gastrointestinal: Positive for abdominal pain, constipation and hematochezia. Negative for nausea, vomiting, diarrhea and melena.  Genitourinary: Negative for dysuria and hematuria.  All other systems reviewed and are negative.     Allergies  Bee venom  Home Medications   Prior to Admission medications   Medication Sig Start Date End Date Taking? Authorizing Provider  albuterol (PROVENTIL HFA;VENTOLIN HFA) 108 (90 BASE) MCG/ACT inhaler Inhale 2 puffs into the lungs every 6 (six) hours as needed for wheezing. 09/02/14  Yes Tobey Grim, MD  baclofen (LIORESAL) 10 MG tablet Take 1 tablet (10 mg total) by mouth 3 (three) times daily. Patient taking differently: Take 10 mg by mouth 2 (two) times daily as needed for muscle spasms.  09/17/14  Yes Tobey Grim, MD  citalopram (CELEXA) 20 MG tablet Take 20 mg by mouth daily.   Yes Historical Provider, MD  cloNIDine (CATAPRES) 0.1  MG tablet Take 1 tablet (0.1 mg total) by mouth 3 (three) times daily. X 2 weeks Patient taking differently: Take 0.1 mg by mouth daily as needed (anxiety, headache). X 2 weeks 09/17/14  Yes Tobey Grim, MD  ferrous sulfate 325 (65 FE) MG tablet Take 1 tablet (325 mg total) by mouth daily with breakfast. 09/02/14  Yes Tobey Grim, MD  lisinopril-hydrochlorothiazide (PRINZIDE,ZESTORETIC) 20-25 MG per tablet Take 1 tablet by mouth every morning. Patient taking differently: Take 1 tablet by mouth daily with lunch.  04/08/14  Yes Tobey Grim, MD  loratadine (CLARITIN) 10 MG tablet Take 1 tablet  (10 mg total) by mouth every morning. Patient taking differently: Take 10 mg by mouth daily with lunch.  04/08/14  Yes Tobey Grim, MD  LORazepam (ATIVAN) 0.5 MG tablet Take 1 tablet (0.5 mg total) by mouth at bedtime. Patient taking differently: Take 0.5 mg by mouth at bedtime as needed (resless leg syndrome).  09/02/14  Yes Tobey Grim, MD  oxyCODONE-acetaminophen (PERCOCET) 10-325 MG per tablet Take 1 tablet by mouth every 8 (eight) hours as needed for pain. Patient taking differently: Take 1 tablet by mouth 3 (three) times daily. scheduled 10/16/14  Yes Tobey Grim, MD  traMADol (ULTRAM) 50 MG tablet Take 2 tablets 2-3 times a day as needed Patient taking differently: Take 50 mg by mouth at bedtime as needed (restless leg syndrome).  09/17/14  Yes Tobey Grim, MD  sulfamethoxazole-trimethoprim (SEPTRA DS) 800-160 MG per tablet Take 1 tablet by mouth every 12 (twelve) hours. 10/26/14   Mirian Mo, MD   BP 136/95 mmHg  Pulse 88  Temp(Src) 98 F (36.7 C) (Oral)  Resp 20  Ht  (1.803 m)  Wt 253 lb (114.76 kg)  BMI 35.30 kg/m2  SpO2 94% Physical Exam  Constitutional: He is oriented to person, place, and time. He appears well-developed and well-nourished.  HENT:  Head: Normocephalic and atraumatic.  Eyes: Conjunctivae and EOM are normal.  Neck: Normal range of motion. Neck supple.  Cardiovascular: Normal rate, regular rhythm and normal heart sounds.   Pulmonary/Chest: Effort normal and breath sounds normal. No respiratory distress.  Abdominal: He exhibits no distension. There is tenderness in the right upper quadrant. There is no rebound and no guarding.  Genitourinary:  3 cm fluctuant abscess of L gluteal cleft  Musculoskeletal: Normal range of motion.  Neurological: He is alert and oriented to person, place, and time.  Skin: Skin is warm and dry.  Vitals reviewed.   ED Course  INCISION AND DRAINAGE Date/Time: 10/26/2014 9:32 PM Performed by: Mirian Mo Authorized by: Mirian Mo Consent: Verbal consent obtained. Type: abscess Body area: anogenital Location details: gluteal cleft Anesthesia: local infiltration Local anesthetic: lidocaine 1% with epinephrine Scalpel size: 11 Incision type: single straight Complexity: simple Drainage: purulent Drainage amount: scant Wound treatment: wound left open Patient tolerance: Patient tolerated the procedure well with no immediate complications   (including critical care time) Labs Review Labs Reviewed  COMPREHENSIVE METABOLIC PANEL - Abnormal; Notable for the following:    Glucose, Bld 117 (*)    All other components within normal limits  CBC WITH DIFFERENTIAL/PLATELET  LIPASE, BLOOD  OCCULT BLOOD X 1 CARD TO LAB, STOOL    Imaging Review No results found.   EKG Interpretation None      MDM   Final diagnoses:  Hematochezia  Abscess    49 y.o. male with pertinent PMH of PUD with prior bleed, COPD,  HTN presents with recurrent rectal bleeding, however this time bright red and associated with prior constipation and painful bowel movements.  Patient denies upper GI symptoms. He also complains of a "boil" in his gluteal cleft. On arrival today vitals signs and physical exam as above. Patient not tachycardic on my exam.  Abscess drained as above.   No gross blood on rectal exam, and pt has small anal fissure which likely explains blood.  Hgb stable.  He was given strict return precautions for GI bleed, voiced understanding, and agreed to fu.  Bactrim given for abscess ppx given location.  I have reviewed all laboratory and imaging studies if ordered as above  1. Hematochezia   2. Abscess         Mirian Mo, MD 10/26/14 2134

## 2014-10-26 NOTE — ED Notes (Addendum)
Pt reports having mid abd pain since yesterday morning. Having nausea and reports bright red blood in stools feels like abd is distended. Hx of bleeding ulcer.

## 2014-10-26 NOTE — Discharge Instructions (Signed)
Bloody Stools Bloody stools often mean that there is a problem in the digestive tract. Your caregiver may use the term "melena" to describe black, tarry, and bad smelling stools or "hematochezia" to describe red or maroon-colored stools. Blood seen in the stool can be caused by bleeding anywhere along the intestinal tract.  A black stool usually means that blood is coming from the upper part of the gastrointestinal tract (esophagus, stomach, or small bowel). Passing maroon-colored stools or bright red blood usually means that blood is coming from lower down in the large bowel or the rectum. However, sometimes massive bleeding in the stomach or small intestine can cause bright red bloody stools.  Consuming black licorice, lead, iron pills, medicines containing bismuth subsalicylate, or blueberries can also cause black stools. Your caregiver can test black stools to see if blood is present. It is important that the cause of the bleeding be found. Treatment can then be started, and the problem can be corrected. Rectal bleeding may not be serious, but you should not assume everything is okay until you know the cause.It is very important to follow up with your caregiver or a specialist in gastrointestinal problems. CAUSES  Blood in the stools can come from various underlying causes.Often, the cause is not found during your first visit. Testing is often needed to discover the cause of bleeding in the gastrointestinal tract. Causes range from simple to serious or even life-threatening.Possible causes include:  Hemorrhoids.These are veins that are full of blood (engorged) in the rectum. They cause pain, inflammation, and may bleed.  Anal fissures.These are areas of painful tearing which may bleed. They are often caused by passing hard stool.  Diverticulosis.These are pouches that form on the colon over time, with age, and may bleed significantly.  Diverticulitis.This is inflammation in areas with  diverticulosis. It can cause pain, fever, and bloody stools, although bleeding is rare.  Proctitis and colitis. These are inflamed areas of the rectum or colon. They may cause pain, fever, and bloody stools.  Polyps and cancer. Colon cancer is a leading cause of preventable cancer death.It often starts out as precancerous polyps that can be removed during a colonoscopy, preventing progression into cancer. Sometimes, polyps and cancer may cause rectal bleeding.  Gastritis and ulcers.Bleeding from the upper gastrointestinal tract (near the stomach) may travel through the intestines and produce black, sometimes tarry, often bad smelling stools. In certain cases, if the bleeding is fast enough, the stools may not be black, but red and the condition may be life-threatening. SYMPTOMS  You may have stools that are bright red and bloody, that are normal color with blood on them, or that are dark black and tarry. In some cases, you may only have blood in the toilet bowl. Any of these cases need medical care. You may also have:  Pain at the anus or anywhere in the rectum.  Lightheadedness or feeling faint.  Extreme weakness.  Nausea or vomiting.  Fever. DIAGNOSIS Your caregiver may use the following methods to find the cause of your bleeding:  Taking a medical history. Age is important. Older people tend to develop polyps and cancer more often. If there is anal pain and a hard, large stool associated with bleeding, a tear of the anus may be the cause. If blood drips into the toilet after a bowel movement, bleeding hemorrhoids may be the problem. The color and frequency of the bleeding are additional considerations. In most cases, the medical history provides clues, but seldom the final  answer.  A visual and finger (digital) exam. Your caregiver will inspect the anal area, looking for tears and hemorrhoids. A finger exam can provide information when there is tenderness or a growth inside. In men, the  prostate is also examined.  Endoscopy. Several types of small, long scopes (endoscopes) are used to view the colon.  In the office, your caregiver may use a rigid, or more commonly, a flexible viewing sigmoidoscope. This exam is called flexible sigmoidoscopy. It is performed in 5 to 10 minutes.  A more thorough exam is accomplished with a colonoscope. It allows your caregiver to view the entire 5 to 6 foot long colon. Medicine to help you relax (sedative) is usually given for this exam. Frequently, a bleeding lesion may be present beyond the reach of the sigmoidoscope. So, a colonoscopy may be the best exam to start with. Both exams are usually done on an outpatient basis. This means the patient does not stay overnight in the hospital or surgery center.  An upper endoscopy may be needed to examine your stomach. Sedation is used and a flexible endoscope is put in your mouth, down to your stomach.  A barium enema X-ray. This is an X-ray exam. It uses liquid barium inserted by enema into the rectum. This test alone may not identify an actual bleeding point. X-rays highlight abnormal shadows, such as those made by lumps (tumors), diverticuli, or colitis. TREATMENT  Treatment depends on the cause of your bleeding.   For bleeding from the stomach or colon, the caregiver doing your endoscopy or colonoscopy may be able to stop the bleeding as part of the procedure.  Inflammation or infection of the colon can be treated with medicines.  Many rectal problems can be treated with creams, suppositories, or warm baths.  Surgery is sometimes needed.  Blood transfusions are sometimes needed if you have lost a lot of blood.  For any bleeding problem, let your caregiver know if you take aspirin or other blood thinners regularly. HOME CARE INSTRUCTIONS   Take any medicines exactly as prescribed.  Keep your stools soft by eating a diet high in fiber. Prunes (1 to 3 a day) work well for many people.  Drink  enough water and fluids to keep your urine clear or pale yellow.  Take sitz baths if advised. A sitz bath is when you sit in a bathtub with warm water for 10 to 15 minutes to soak, soothe, and cleanse the rectal area.  If enemas or suppositories are advised, be sure you know how to use them. Tell your caregiver if you have problems with this.  Monitor your bowel movements to look for signs of improvement or worsening. SEEK MEDICAL CARE IF:   You do not improve in the time expected.  Your condition worsens after initial improvement.  You develop any new symptoms. SEEK IMMEDIATE MEDICAL CARE IF:   You develop severe or prolonged rectal bleeding.  You vomit blood.  You feel weak or faint.  You have a fever. MAKE SURE YOU:  Understand these instructions.  Will watch your condition.  Will get help right away if you are not doing well or get worse. Document Released: 09/09/2002 Document Revised: 12/12/2011 Document Reviewed: 02/04/2011 [facility] Patient Information 2015 [place], [place]. This information is not intended to replace advice given to you by your health care provider. Make sure you discuss any questions you have with your health care provider. Abscess An abscess is an infected area that contains a collection of pus  and debris.It can occur in almost any part of the body. An abscess is also known as a furuncle or boil. CAUSES  An abscess occurs when tissue gets infected. This can occur from blockage of oil or sweat glands, infection of hair follicles, or a minor injury to the skin. As the body tries to fight the infection, pus collects in the area and creates pressure under the skin. This pressure causes pain. People with weakened immune systems have difficulty fighting infections and get certain abscesses more often.  SYMPTOMS Usually an abscess develops on the skin and becomes a painful mass that is red, warm, and tender. If the abscess forms under the skin, you may feel a  moveable soft area under the skin. Some abscesses break open (rupture) on their own, but most will continue to get worse without care. The infection can spread deeper into the body and eventually into the bloodstream, causing you to feel ill.  DIAGNOSIS  Your caregiver will take your medical history and perform a physical exam. A sample of fluid may also be taken from the abscess to determine what is causing your infection. TREATMENT  Your caregiver may prescribe antibiotic medicines to fight the infection. However, taking antibiotics alone usually does not cure an abscess. Your caregiver may need to make a small cut (incision) in the abscess to drain the pus. In some cases, gauze is packed into the abscess to reduce pain and to continue draining the area. HOME CARE INSTRUCTIONS   Only take over-the-counter or prescription medicines for pain, discomfort, or fever as directed by your caregiver.  If you were prescribed antibiotics, take them as directed. Finish them even if you start to feel better.  If gauze is used, follow your caregiver's directions for changing the gauze.  To avoid spreading the infection:  Keep your draining abscess covered with a bandage.  Wash your hands well.  Do not share personal care items, towels, or whirlpools with others.  Avoid skin contact with others.  Keep your skin and clothes clean around the abscess.  Keep all follow-up appointments as directed by your caregiver. SEEK MEDICAL CARE IF:   You have increased pain, swelling, redness, fluid drainage, or bleeding.  You have muscle aches, chills, or a general ill feeling.  You have a fever. MAKE SURE YOU:   Understand these instructions.  Will watch your condition.  Will get help right away if you are not doing well or get worse. Document Released: 06/29/2005 Document Revised: 03/20/2012 Document Reviewed: 12/02/2011 [facility] Patient Information 2015 [place], [place]. This information is not  intended to replace advice given to you by your health care provider. Make sure you discuss any questions you have with your health care provider.

## 2014-10-27 LAB — POC OCCULT BLOOD, ED: Fecal Occult Bld: POSITIVE — AB

## 2014-10-28 ENCOUNTER — Ambulatory Visit (INDEPENDENT_AMBULATORY_CARE_PROVIDER_SITE_OTHER): Payer: No Typology Code available for payment source | Admitting: Family Medicine

## 2014-10-28 ENCOUNTER — Encounter: Payer: Self-pay | Admitting: Family Medicine

## 2014-10-28 VITALS — BP 139/89 | HR 101 | Temp 97.9°F | Ht 71.0 in | Wt 247.3 lb

## 2014-10-28 DIAGNOSIS — Z7189 Other specified counseling: Secondary | ICD-10-CM

## 2014-10-28 DIAGNOSIS — K5909 Other constipation: Secondary | ICD-10-CM

## 2014-10-28 DIAGNOSIS — G8929 Other chronic pain: Secondary | ICD-10-CM

## 2014-10-28 DIAGNOSIS — K5900 Constipation, unspecified: Secondary | ICD-10-CM | POA: Insufficient documentation

## 2014-10-28 DIAGNOSIS — F418 Other specified anxiety disorders: Secondary | ICD-10-CM

## 2014-10-28 MED ORDER — OXYCODONE-ACETAMINOPHEN 5-325 MG PO TABS: 1.0000 | ORAL_TABLET | Freq: Three times a day (TID) | ORAL | Status: DC | PRN

## 2014-10-28 MED ORDER — LORAZEPAM 0.5 MG PO TABS: 0.5000 mg | ORAL_TABLET | Freq: Every evening | ORAL | Status: DC | PRN

## 2014-10-28 MED ORDER — TRAMADOL HCL 50 MG PO TABS: 50.0000 mg | ORAL_TABLET | Freq: Every evening | ORAL | Status: DC | PRN

## 2014-10-28 NOTE — Progress Notes (Signed)
Subjective:    Trevor Thomas is a 49 y.o. male who presents to [facility] today:  1.  Recent ED visits:  Patient has been to ED twice in past 2 weeks.  First was for mechanical fall onto a sharp object in his pocket.  Had negative CT scan.  Second was for pain and hematochezia.  Still had some residual pain in RUQ at this visit.  Found to have 3 cm abscess which was drained at that second visit.  Also found to have anal fissure.  Hgb was within normal limits at both visits.    Anal fissure secondary to constipation, which started after he increased to 10 mg of Oxycodone.  Never had trouble with bowel movements nor need for stool softener on 5 mg.  He is having to use stool softener intermittently and still has 2-3 days in between BMs.  No nausea/vomiting.  Does have some abdominal bloating.  Last BM was yesterday.  + flatus.  He is asking to decrease his percocet from 10 mg to 5 mg to prevent further constipation.     ROS as above per HPI, otherwise neg.    The following portions of the patient's history were reviewed and updated as appropriate: allergies, current medications, past medical history, family and social history, and problem list. Patient is a nonsmoker.    PMH reviewed.  Past Medical History  Diagnosis Date  . Hypertension   . COPD (chronic obstructive pulmonary disease)   . Bronchitis     hx of  . Asthma   . Concussion 1983  . Headache(784.0)   . Anxiety   . Depression   . Umbilical hernia   . GERD (gastroesophageal reflux disease)     takes tums/rolaids  . Arthritis   . Head trauma   . 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 Knee surgery    . Thumb surgery (left)    . Umbilical hernia repair  05/15/2012    Procedure: HERNIA REPAIR UMBILICAL ADULT;  Surgeon: Clovis Pu. Cornett, MD;  Location: [facility] OR;  Service: General;  Laterality: N/A;  . Hernia repair  05/2012    umbilical hernia  .  Eye surgery  20 years ago    Metal foreign body removed from Right eye  . Esophagogastroduodenoscopy N/A 08/25/2014    Procedure: ESOPHAGOGASTRODUODENOSCOPY (EGD);  Surgeon: Louis Meckel, MD;  Location: [facility] ENDOSCOPY;  Service: Endoscopy;  Laterality: N/A;    Medications reviewed. Current Outpatient Prescriptions  Medication Sig Dispense Refill  . albuterol (PROVENTIL HFA;VENTOLIN HFA) 108 (90 BASE) MCG/ACT inhaler Inhale 2 puffs into the lungs every 6 (six) hours as needed for wheezing. 8 g 1  . baclofen (LIORESAL) 10 MG tablet Take 1 tablet (10 mg total) by mouth 3 (three) times daily. (Patient taking differently: Take 10 mg by mouth 2 (two) times daily as needed for muscle spasms. ) 30 each 0  . citalopram (CELEXA) 20 MG tablet Take 20 mg by mouth daily.    . cloNIDine (CATAPRES) 0.1 MG tablet Take 1 tablet (0.1 mg total) by mouth 3 (three) times daily. X 2 weeks (Patient taking differently: Take 0.1 mg by mouth daily as needed (anxiety, headache). X 2 weeks) 45 tablet 1  . ferrous sulfate 325 (65 FE) MG tablet Take 1 tablet (325 mg total) by mouth daily with breakfast. 90 tablet 3  . lisinopril-hydrochlorothiazide (PRINZIDE,ZESTORETIC) 20-25 MG  per tablet Take 1 tablet by mouth every morning. (Patient taking differently: Take 1 tablet by mouth daily with lunch. ) 90 tablet 2  . loratadine (CLARITIN) 10 MG tablet Take 1 tablet (10 mg total) by mouth every morning. (Patient taking differently: Take 10 mg by mouth daily with lunch. ) 30 tablet 11  . LORazepam (ATIVAN) 0.5 MG tablet Take 1 tablet (0.5 mg total) by mouth at bedtime. (Patient taking differently: Take 0.5 mg by mouth at bedtime as needed (resless leg syndrome). ) 30 tablet 1  . oxyCODONE-acetaminophen (PERCOCET) 10-325 MG per tablet Take 1 tablet by mouth every 8 (eight) hours as needed for pain. (Patient taking differently: Take 1 tablet by mouth 3 (three) times daily. scheduled) 90 tablet 0  . sulfamethoxazole-trimethoprim (SEPTRA  DS) 800-160 MG per tablet Take 1 tablet by mouth every 12 (twelve) hours. 14 tablet 0  . traMADol (ULTRAM) 50 MG tablet Take 2 tablets 2-3 times a day as needed (Patient taking differently: Take 50 mg by mouth at bedtime as needed (restless leg syndrome). ) 60 tablet 1   No current facility-administered medications for this visit.     Objective:   Physical Exam BP 139/89 mmHg  Pulse 101  Temp(Src) 97.9 F (36.6 C) (Oral)  Ht 5\' 11"  (1.803 m)  Wt 247 lb 4.8 oz (112.175 kg)  BMI 34.51 kg/m2 Gen:  Alert, cooperative patient who appears stated age in no acute distress.  Vital signs reviewed. Abd:  Obese.  Soft/NT Skin:  Healing laceration noted on Right flank.  No signs of infection/erythema/purulence Rectal:  Healed abscess.  Visual inspection only.

## 2014-10-28 NOTE — Assessment & Plan Note (Signed)
Decreasing to 5 mg Oxycodone per patient request due to adverse effects.   Tramadol for breakthrough pain at night.

## 2014-10-28 NOTE — Patient Instructions (Signed)
Take the stool softener with the Percocet.  Other refills today.  Glad everything's healed up.

## 2014-10-28 NOTE — Assessment & Plan Note (Signed)
Controlled with Celexa and prn ativan at night to help with sleep.  Refill for ativan today.

## 2014-10-28 NOTE — Assessment & Plan Note (Signed)
Daily stool softener while on opioids. Miralax PRN.  No red flags or concerning findings.

## 2014-11-14 ENCOUNTER — Telehealth: Payer: Self-pay | Admitting: Family Medicine

## 2014-11-14 MED ORDER — OXYCODONE-ACETAMINOPHEN 5-325 MG PO TABS: 1.0000 | ORAL_TABLET | Freq: Three times a day (TID) | ORAL | Status: DC | PRN

## 2014-11-14 NOTE — Telephone Encounter (Signed)
Pt called and would like to speak to Huntley Dec about an issue he is having. Would not tell me. jw

## 2014-11-14 NOTE — Telephone Encounter (Signed)
Patient states that his rx ends on a weekend once again and that he would like this if possible today

## 2014-11-14 NOTE — Telephone Encounter (Signed)
OK to call and ok this medicine for today instead of Sunday.

## 2014-12-05 ENCOUNTER — Ambulatory Visit: Payer: Self-pay | Admitting: Family Medicine

## 2014-12-05 ENCOUNTER — Other Ambulatory Visit: Payer: Self-pay | Admitting: Family Medicine

## 2014-12-05 DIAGNOSIS — G8929 Other chronic pain: Secondary | ICD-10-CM

## 2014-12-05 MED ORDER — OXYCODONE-ACETAMINOPHEN 5-325 MG PO TABS: 1.0000 | ORAL_TABLET | Freq: Three times a day (TID) | ORAL | Status: DC | PRN

## 2014-12-05 NOTE — Progress Notes (Signed)
Patient evidently had appt today.  Told front office staff that he did NOT cancel.  Evidently there is some problem with the phones canceling patient appts?  (this has happened several times recently).  Here for chronic narcotic refills.  Can have these filled once today, needs appt before next refills.

## 2014-12-31 ENCOUNTER — Other Ambulatory Visit: Payer: Self-pay | Admitting: Family Medicine

## 2014-12-31 NOTE — Telephone Encounter (Signed)
Patient will need to make an appointment with PCP for refill of narcotic pain medications.  RN staff - please call patient to schedule

## 2014-12-31 NOTE — Telephone Encounter (Signed)
Tried to contact pt and the number that was listed as home [phone]) someone answered and stated this was the wrong number and that this was the second time someone had tried to call him there.  I removed that number from his file so we need to get a new number for him.  The one we have for him now is a work number. Lamonte Sakai, Rodrickus Min D

## 2014-12-31 NOTE — Telephone Encounter (Signed)
Pt called and would like a refill on his pain medication. jw

## 2015-01-01 ENCOUNTER — Ambulatory Visit (INDEPENDENT_AMBULATORY_CARE_PROVIDER_SITE_OTHER): Payer: Self-pay | Admitting: Family Medicine

## 2015-01-01 ENCOUNTER — Encounter: Payer: Self-pay | Admitting: Family Medicine

## 2015-01-01 VITALS — BP 143/97 | HR 95 | Temp 98.6°F | Ht 71.0 in | Wt 265.0 lb

## 2015-01-01 DIAGNOSIS — Z7189 Other specified counseling: Secondary | ICD-10-CM

## 2015-01-01 DIAGNOSIS — B999 Unspecified infectious disease: Secondary | ICD-10-CM

## 2015-01-01 DIAGNOSIS — G8929 Other chronic pain: Secondary | ICD-10-CM

## 2015-01-01 DIAGNOSIS — B89 Unspecified parasitic disease: Secondary | ICD-10-CM

## 2015-01-01 MED ORDER — OXYCODONE-ACETAMINOPHEN 5-325 MG PO TABS: 1.0000 | ORAL_TABLET | Freq: Three times a day (TID) | ORAL | Status: DC | PRN

## 2015-01-01 MED ORDER — CEPHALEXIN 500 MG PO CAPS: 500.0000 mg | ORAL_CAPSULE | Freq: Three times a day (TID) | ORAL | Status: DC

## 2015-01-01 NOTE — Assessment & Plan Note (Signed)
Refill percocet today since Dr. Gwendolyn Grant not in F/U with PCP on April 22nd for further Rx

## 2015-01-01 NOTE — Assessment & Plan Note (Signed)
HTC OTC 1% to area for itching and benadryl Start Keflex 500 mg TID x 7 days for superimposed cellulitis of R distal posterior forearm  F/U PRN

## 2015-01-01 NOTE — Progress Notes (Signed)
Trevor Thomas is a 49 y.o. male who presents today for possible infected bug bites  Bug Bites - Was visiting mother over easter last weekend in [place] and had fire ants crawl up his legs when outside.  Started swatting them with R hand, and a few crawled onto his arm and bite him.  Tried Benadryl for itching but continued to itch and now two lesions on R arm with erythema and slight purulent drainage.  Denies fever, chills, sweats, numbness tingling or burning in his R arm.    Past Medical History  Diagnosis Date  . Hypertension   . COPD (chronic obstructive pulmonary disease)   . Bronchitis     hx of  . Asthma   . Concussion 1983  . Headache(784.0)   . Anxiety   . Depression   . Umbilical hernia   . GERD (gastroesophageal reflux disease)     takes tums/rolaids  . Arthritis   . Head trauma   . 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  Secondary to eye surgery    History  Smoking status  . Current Every Day Smoker -- 0.75 packs/day for 20 years  . Types: Cigarettes  Smokeless tobacco  . Never Used    Family History  Problem Relation Age of Onset  . Diabetes Mother   . Diabetes Father   . Heart failure Brother     Current Outpatient Prescriptions on File Prior to Visit  Medication Sig Dispense Refill  . albuterol (PROVENTIL HFA;VENTOLIN HFA) 108 (90 BASE) MCG/ACT inhaler Inhale 2 puffs into the lungs every 6 (six) hours as needed for wheezing. 8 g 1  . baclofen (LIORESAL) 10 MG tablet Take 1 tablet (10 mg total) by mouth 3 (three) times daily. (Patient taking differently: Take 10 mg by mouth 2 (two) times daily as needed for muscle spasms. ) 30 each 0  . citalopram (CELEXA) 20 MG tablet Take 20 mg by mouth daily.    . cloNIDine (CATAPRES) 0.1 MG tablet Take 1 tablet (0.1 mg total) by mouth 3 (three) times daily. X 2 weeks (Patient taking differently: Take 0.1 mg by mouth daily as needed (anxiety, headache). X 2 weeks) 45 tablet 1  . ferrous sulfate 325 (65 FE) MG  tablet Take 1 tablet (325 mg total) by mouth daily with breakfast. 90 tablet 3  . lisinopril-hydrochlorothiazide (PRINZIDE,ZESTORETIC) 20-25 MG per tablet Take 1 tablet by mouth every morning. (Patient taking differently: Take 1 tablet by mouth daily with lunch. ) 90 tablet 2  . loratadine (CLARITIN) 10 MG tablet Take 1 tablet (10 mg total) by mouth every morning. (Patient taking differently: Take 10 mg by mouth daily with lunch. ) 30 tablet 11  . LORazepam (ATIVAN) 0.5 MG tablet Take 1 tablet (0.5 mg total) by mouth at bedtime as needed for anxiety. 30 tablet 1  . oxyCODONE-acetaminophen (ROXICET) 5-325 MG per tablet Take 1 tablet by mouth every 8 (eight) hours as needed for severe pain. 90 tablet 0  . sulfamethoxazole-trimethoprim (SEPTRA DS) 800-160 MG per tablet Take 1 tablet by mouth every 12 (twelve) hours. 14 tablet 0  . traMADol (ULTRAM) 50 MG tablet Take 1 tablet (50 mg total) by mouth at bedtime as needed (restless leg syndrome). 60 tablet 1   No current facility-administered medications on file prior to visit.    ROS: Per HPI.  All other systems reviewed and are negative.   Physical Exam Filed Vitals:   01/01/15 1017  BP: 143/97  Pulse: 95  Temp: 98.6 F (37 C)    Physical Examination: Skin - Scattered erythematous papules anterior shins B/L.  As well two 0.5 mm lesions with purulent material and surrounding erythema on R ulnar distal forearm     Chemistry      Component Value Date/Time   NA 137 10/26/2014 1614   K 3.9 10/26/2014 1614   CL 103 10/26/2014 1614   CO2 29 10/26/2014 1614   BUN 15 10/26/2014 1614   CREATININE 0.80 10/26/2014 1614   CREATININE 0.68 09/02/2014 1018      Component Value Date/Time   CALCIUM 9.3 10/26/2014 1614   ALKPHOS 88 10/26/2014 1614   AST 21 10/26/2014 1614   ALT 18 10/26/2014 1614   BILITOT 0.4 10/26/2014 1614

## 2015-01-01 NOTE — Patient Instructions (Signed)
Cephalexin tablets or capsules  What is this medicine?  CEPHALEXIN (sef a LEX in) is a cephalosporin antibiotic. It is used to treat certain kinds of bacterial infections It will not work for colds, flu, or other viral infections.  This medicine may be used for other purposes; ask your health care provider or pharmacist if you have questions.  COMMON BRAND NAME(S): Biocef, Keflex, Keftab  What should I tell my health care provider before I take this medicine?  They need to know if you have any of these conditions:  -kidney disease  -stomach or intestine problems, especially colitis  -an unusual or allergic reaction to cephalexin, other cephalosporins, penicillins, other antibiotics, medicines, foods, dyes or preservatives  -pregnant or trying to get pregnant  -breast-feeding  How should I use this medicine?  Take this medicine by mouth with a full glass of water. Follow the directions on the prescription label. This medicine can be taken with or without food. Take your medicine at regular intervals. Do not take your medicine more often than directed. Take all of your medicine as directed even if you think you are better. Do not skip doses or stop your medicine early.  Talk to your pediatrician regarding the use of this medicine in children. While this drug may be prescribed for selected conditions, precautions do apply.  Overdosage: If you think you have taken too much of this medicine contact a poison control center or emergency room at once.  NOTE: This medicine is only for you. Do not share this medicine with others.  What if I miss a dose?  If you miss a dose, take it as soon as you can. If it is almost time for your next dose, take only that dose. Do not take double or extra doses. There should be at least 4 to 6 hours between doses.  What may interact with this medicine?  -probenecid  -some other antibiotics  This list may not describe all possible interactions. Give your health care provider a list of all the  medicines, herbs, non-prescription drugs, or dietary supplements you use. Also tell them if you smoke, drink alcohol, or use illegal drugs. Some items may interact with your medicine.  What should I watch for while using this medicine?  Tell your doctor or health care professional if your symptoms do not begin to improve in a few days.  Do not treat diarrhea with over the counter products. Contact your doctor if you have diarrhea that lasts more than 2 days or if it is severe and watery.  If you have diabetes, you may get a false-positive result for sugar in your urine. Check with your doctor or health care professional.  What side effects may I notice from receiving this medicine?  Side effects that you should report to your doctor or health care professional as soon as possible:  -allergic reactions like skin rash, itching or hives, swelling of the face, lips, or tongue  -breathing problems  -pain or trouble passing urine  -redness, blistering, peeling or loosening of the skin, including inside the mouth  -severe or watery diarrhea  -unusually weak or tired  -yellowing of the eyes, skin  Side effects that usually do not require medical attention (report to your doctor or health care professional if they continue or are bothersome):  -gas or heartburn  -genital or anal irritation  -headache  -joint or muscle pain  -nausea, vomiting  This list may not describe all possible side effects. Call your   doctor for medical advice about side effects. You may report side effects to FDA at 1-800-FDA-1088.  Where should I keep my medicine?  Keep out of the reach of children.  Store at room temperature between 59 and 86 degrees F (15 and 30 degrees C). Throw away any unused medicine after the expiration date.  NOTE: This sheet is a summary. It may not cover all possible information. If you have questions about this medicine, talk to your doctor, pharmacist, or health care provider.   2015, Elsevier/Gold Standard. (2007-12-24  17:09:13)

## 2015-01-12 ENCOUNTER — Other Ambulatory Visit: Payer: Self-pay | Admitting: Family Medicine

## 2015-01-12 NOTE — Telephone Encounter (Signed)
Pt called and needs refill on his Lisinopril and Claritin. jw

## 2015-01-14 MED ORDER — LORATADINE 10 MG PO TABS: 10.0000 mg | ORAL_TABLET | Freq: Every morning | ORAL | Status: DC

## 2015-01-14 NOTE — Telephone Encounter (Signed)
Done

## 2015-01-23 ENCOUNTER — Telehealth: Payer: Self-pay | Admitting: *Deleted

## 2015-01-23 ENCOUNTER — Ambulatory Visit (INDEPENDENT_AMBULATORY_CARE_PROVIDER_SITE_OTHER): Payer: Self-pay | Admitting: Family Medicine

## 2015-01-23 ENCOUNTER — Encounter: Payer: Self-pay | Admitting: Family Medicine

## 2015-01-23 VITALS — BP 150/98 | HR 102 | Temp 98.6°F | Ht 71.0 in | Wt 264.0 lb

## 2015-01-23 DIAGNOSIS — M549 Dorsalgia, unspecified: Secondary | ICD-10-CM

## 2015-01-23 DIAGNOSIS — Z7189 Other specified counseling: Secondary | ICD-10-CM

## 2015-01-23 DIAGNOSIS — L723 Sebaceous cyst: Secondary | ICD-10-CM

## 2015-01-23 DIAGNOSIS — M545 Low back pain, unspecified: Secondary | ICD-10-CM

## 2015-01-23 DIAGNOSIS — F418 Other specified anxiety disorders: Secondary | ICD-10-CM

## 2015-01-23 DIAGNOSIS — I10 Essential (primary) hypertension: Secondary | ICD-10-CM

## 2015-01-23 DIAGNOSIS — G8929 Other chronic pain: Secondary | ICD-10-CM

## 2015-01-23 DIAGNOSIS — J449 Chronic obstructive pulmonary disease, unspecified: Secondary | ICD-10-CM

## 2015-01-23 DIAGNOSIS — Z720 Tobacco use: Secondary | ICD-10-CM

## 2015-01-23 DIAGNOSIS — D509 Iron deficiency anemia, unspecified: Secondary | ICD-10-CM

## 2015-01-23 HISTORY — DX: Sebaceous cyst: L72.3

## 2015-01-23 LAB — POCT HEMOGLOBIN: Hemoglobin: 15.6 g/dL (ref 14.1–18.1)

## 2015-01-23 MED ORDER — CITALOPRAM HYDROBROMIDE 40 MG PO TABS: 40.0000 mg | ORAL_TABLET | Freq: Every day | ORAL | Status: DC

## 2015-01-23 MED ORDER — OXYCODONE-ACETAMINOPHEN 5-325 MG PO TABS: 1.0000 | ORAL_TABLET | Freq: Three times a day (TID) | ORAL | Status: DC | PRN

## 2015-01-23 NOTE — Assessment & Plan Note (Signed)
Counseled to fully quit today.

## 2015-01-23 NOTE — Addendum Note (Signed)
Addended by: Tobey Grim on: 01/23/2015 09:48 AM   Modules accepted: Orders

## 2015-01-23 NOTE — Telephone Encounter (Signed)
Will keep at current instructions rather than increase.  I have referred him to Physical Medicine Rehab for further functional assessment and pain medications.

## 2015-01-23 NOTE — Progress Notes (Signed)
Subjective:    Trevor Thomas is a 49 y.o. male who presents to [facility] today for several issues:  1.  Chronic pain:  No real changes in pain levels -- HAS been taking more than prescribed oxycodone and thus running out early.  He takes 10 mg po bid.  His wife manages his Percocet and will not give it to him early or change the dosage despite what the prescriptions says.  As below, he feels helpless and depressed about not being able to work.  No bladder/bowel incontinence.  No real days that are pain-free.  He does have days he overdoes it and is in more pain the next day.   2.  Iron deficiency anemia:  Better last couple of checks.  Has felt fatigued recently but thinks this might be more due to depression.  Would like his iron rechecked today.  Hasn't taken this for several weeks.  No melena or BRBPR  3.  Depression:  Feels that his depression continues to take a toll.  Was seen at [facility] about 6 weeks ago, scheduled for appt yesterday, Missed appointment yesterday b/c slept through it but on further discussion felt it was a waste of time due to them "just giving me medicine."  Feels "helpless" over not being able to work and having others doing everything for him, including managing his medications.  No SI/HI.  Does continue to have crying spells once every week or so.  4.  Sebaceous cyst:  Present on Left arm.  Has been there for years but now increasing in size and discomfort.  Would like removed.  No drainage, heat, redness.    ROS as above per HPI, otherwise neg.  The following portions of the patient's history were reviewed and updated as appropriate: allergies, current medications, past medical history, family and social history, and problem list. Patient is a nonsmoker.    PMH reviewed.  Past Medical History  Diagnosis Date  . Hypertension   . COPD (chronic obstructive pulmonary disease)   . Bronchitis     hx of  . Asthma   . Concussion 1983  . Headache(784.0)   . Anxiety   .  Depression   . Umbilical hernia   . GERD (gastroesophageal reflux disease)     takes tums/rolaids  . Arthritis   . Head trauma   . 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 Knee surgery    . Thumb surgery (left)    . Umbilical hernia repair  05/15/2012    Procedure: HERNIA REPAIR UMBILICAL ADULT;  Surgeon: Clovis Pu. Cornett, MD;  Location: [facility] OR;  Service: General;  Laterality: N/A;  . Hernia repair  05/2012    umbilical hernia  . Eye surgery  20 years ago    Metal foreign body removed from Right eye  . Esophagogastroduodenoscopy N/A 08/25/2014    Procedure: ESOPHAGOGASTRODUODENOSCOPY (EGD);  Surgeon: Louis Meckel, MD;  Location: [facility] ENDOSCOPY;  Service: Endoscopy;  Laterality: N/A;    Medications reviewed. Current Outpatient Prescriptions  Medication Sig Dispense Refill  . albuterol (PROVENTIL HFA;VENTOLIN HFA) 108 (90 BASE) MCG/ACT inhaler Inhale 2 puffs into the lungs every 6 (six) hours as needed for wheezing. 8 g 1  . baclofen (LIORESAL) 10 MG tablet Take 1 tablet (10 mg total) by mouth 3 (three) times daily. (Patient taking differently: Take 10 mg by mouth 2 (two) times daily as  needed for muscle spasms. ) 30 each 0  . cephALEXin (KEFLEX) 500 MG capsule Take 1 capsule (500 mg total) by mouth 3 (three) times daily. 21 capsule 0  . citalopram (CELEXA) 20 MG tablet Take 20 mg by mouth daily.    . cloNIDine (CATAPRES) 0.1 MG tablet Take 1 tablet (0.1 mg total) by mouth 3 (three) times daily. X 2 weeks (Patient taking differently: Take 0.1 mg by mouth daily as needed (anxiety, headache). X 2 weeks) 45 tablet 1  . ferrous sulfate 325 (65 FE) MG tablet Take 1 tablet (325 mg total) by mouth daily with breakfast. 90 tablet 3  . lisinopril-hydrochlorothiazide (PRINZIDE,ZESTORETIC) 20-25 MG per tablet TAKE ONE TABLET BY MOUTH IN THE MORNING 90 tablet 2  . loratadine (CLARITIN) 10 MG tablet Take 1 tablet  (10 mg total) by mouth every morning. 30 tablet 11  . LORazepam (ATIVAN) 0.5 MG tablet Take 1 tablet (0.5 mg total) by mouth at bedtime as needed for anxiety. 30 tablet 1  . oxyCODONE-acetaminophen (ROXICET) 5-325 MG per tablet Take 1 tablet by mouth every 8 (eight) hours as needed for severe pain. 90 tablet 0  . traMADol (ULTRAM) 50 MG tablet Take 1 tablet (50 mg total) by mouth at bedtime as needed (restless leg syndrome). 60 tablet 1   No current facility-administered medications for this visit.     Objective:   Physical Exam BP 150/98 mmHg  Pulse 102  Temp(Src) 98.6 F (37 C) (Oral)  Ht 5\' 11"  (1.803 m)  Wt 264 lb (119.75 kg)  BMI 36.84 kg/m2 Gen:  Alert, cooperative patient who appears stated age in no acute distress.  Vital signs reviewed. HEENT: EOMI,  MMM Cardiac:  Regular rate and rhythm without murmur auscultated.  Good S1/S2. Pulm:  Clear to auscultation bilaterally with good air movement.   MSK;  No pain on palpation of back today.  Still limited forward flexion due to pain.  SLR positive for pain BL.   Psych:  Flatter affect than usual.  No hallucinations.  Still with some tangential thinking.    No results found for this or any previous visit (from the past 72 hour(s)).

## 2015-01-23 NOTE — Progress Notes (Signed)
Dr. Gwendolyn Grant requested a Behavioral Health Consultation to assess Kaisei's motivation to return to [facility] for mental health services.  In addition, he wanted me to check in with Reeves's tendency to overdue and then experience pain that negatively affects his mood.  Stinson reported he had a recent increase from 20 mg to 40 mg of Celexa.  He had a 6 week follow-up for [facility] yesterday that he didn't attend because he didn't feel up to it.  He reported he is open to going back and expects that he would get counseling there upon his return.  The majority of the time, Jazziel talked about his difficult situation:  Not working, awaiting disability, homelessness (now living in the back of someone's house) and chronic pain.  He has had difficulty managing his pain medicine in the past - doubling up and running out early.  He states that he notices a negative turn in mood when his pain medicine wears off and an uptick when he is able to take another pill.  In addition to his chronic narcotic, he reports he takes Tramadol and Lorazepam at night to help him sleep.  Arjay grew more restless the longer I stayed in the room.  He states he gets fidgety and can't sit still for long periods of time.  Time period was too short to formally assess mental health and he has a contact at [employer].  Recommendations / Plan:  1) Xaviar stated he will go back there today to schedule another appointment or get seen.  I told him if there are barriers, he could schedule to see Dr. Gwendolyn Grant on a M W or F morning and he would be able to have a brief [employer] as part of that appointment.   2) I wonder if the pain medicine is negatively affecting his mood.  He has been on it for a really long time and I would be curious what his mood would be like if he were off of it for a significant period of time (I suspect things would get worse before they got better).  Additionally, I am not sure what his diagnosis is and it sounds like he  doesn't have a clear idea either.  It would be nice to get a bit more specificity here especially if the increase in Celexa does not prove helpful.    3) Discussed briefly his function and the barriers he has besides pain.  Cherie, in trying to get disability, is wary of doing any activity in public (for fear someone would see and determine he is "able" but also needs to do work for the people in the home in which he stays.  It is a tough place to be - negotiating these things and his pain reportedly limits what he can do and for how long.

## 2015-01-23 NOTE — Assessment & Plan Note (Signed)
THink this is greatly contributing to perceptions of pain and decreased function, which leads to further depression, etc. I also wonder about undiagnosed bipolar?  Seems to have times of heightened anxiety and energy levels. Will explore further next visit.  Consulted [facility] Dr. Spero Geralds come in to talk with him today.

## 2015-01-23 NOTE — Patient Instructions (Addendum)
You met with Dr. Gwenlyn Saran today to discuss your depression and ways to deal with this.  Refill for pain medications today.  I will refer you to the Physical Rehabilitation doctor today for better pain management and help getting you functional.  It was good to see you again today.

## 2015-01-23 NOTE — Assessment & Plan Note (Signed)
Has cut back to 1/2 pack cigarettes. Congratulated him on this. Think behavioral change consult around this might be helpful -- baby steps.  Discuss next visit.

## 2015-01-23 NOTE — Assessment & Plan Note (Signed)
No red flags or reports of GI bleeding currently. Checking Hgb today.  Not currently taking iron. If  Hgb low, need to check iron stores.

## 2015-01-23 NOTE — Assessment & Plan Note (Signed)
Persistent. Think mental health/mood disorder greatly playing into this.   Think he would benefit from referral to Physical Rehabiliton Medicine.  Hopeful they can advise regarding chronic pain management as well as helping Codi obtain some further function -- which would go a long way to helping depression, which would improve back pain, etc.

## 2015-01-23 NOTE — Assessment & Plan Note (Addendum)
Not taking his Oycodone as prescribed. Will NOT change medication today.   He has been on 10 mg and 7.5 mg over the past 6 months.  Each has caused issues.  5 mg does not seem to control his pain.  Referral to PMR today as above.   Has given him 2 months worth of medication.  Think it will be time for weaning down.  Chronic narcotics seem to not be doing well for him.

## 2015-01-23 NOTE — Telephone Encounter (Signed)
Darl Pikes calling from [facility] outpatient pharmacy.  Patient requesting early refill of his pain meds--pharmacy can fill meds 2 days early, but patient requesting 8 days early.  Pharmacy declined to refill patient's med early today due to patient always getting refills to early.  Seems to be taking 4 pills/day instead of 3 pills/day.  Will need for Dr. Gwendolyn Grant to rewrite Rx for this if patient is going to continue running out of med and requesting meds to early.  Will route note to Dr. Gwendolyn Grant.  Altamese Dilling, BSN, RN-BC

## 2015-01-23 NOTE — Assessment & Plan Note (Signed)
Elevated today but has not taken his Lisinopril.  Takes this at noon everyday.   To take today.   If remains elevated next visit, will consider increasing dosage.

## 2015-01-23 NOTE — Assessment & Plan Note (Signed)
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   Desires definitive removal.  Referral to surgery today.

## 2015-01-30 ENCOUNTER — Encounter: Payer: Self-pay | Admitting: Family Medicine

## 2015-01-30 ENCOUNTER — Telehealth: Payer: Self-pay | Admitting: Family Medicine

## 2015-01-30 ENCOUNTER — Ambulatory Visit (INDEPENDENT_AMBULATORY_CARE_PROVIDER_SITE_OTHER): Payer: Self-pay | Admitting: Family Medicine

## 2015-01-30 DIAGNOSIS — F1193 Opioid use, unspecified with withdrawal: Secondary | ICD-10-CM | POA: Insufficient documentation

## 2015-01-30 DIAGNOSIS — F1123 Opioid dependence with withdrawal: Secondary | ICD-10-CM | POA: Insufficient documentation

## 2015-01-30 DIAGNOSIS — Z7189 Other specified counseling: Secondary | ICD-10-CM

## 2015-01-30 DIAGNOSIS — F1120 Opioid dependence, uncomplicated: Secondary | ICD-10-CM

## 2015-01-30 DIAGNOSIS — F1920 Other psychoactive substance dependence, uncomplicated: Secondary | ICD-10-CM

## 2015-01-30 DIAGNOSIS — F19230 Other psychoactive substance dependence with withdrawal, uncomplicated: Secondary | ICD-10-CM

## 2015-01-30 DIAGNOSIS — G8929 Other chronic pain: Secondary | ICD-10-CM

## 2015-01-30 HISTORY — DX: Opioid use, unspecified with withdrawal: F11.93

## 2015-01-30 NOTE — Assessment & Plan Note (Signed)
Told pharmacy to fill Rx today provided by Dr. Gwendolyn Grant

## 2015-01-30 NOTE — Progress Notes (Signed)
   Subjective:    Patient ID: Trevor Thomas, male    DOB: 28-Aug-1966, 49 y.o.   MRN: [id number]  HPI Patient comes in to triage nervous and shaking.  Asking why pharmacy will not fill early as written on Rx.  Spoke directly with pharmacist.  Patient has a long history of requesting early refills.  Per search of database over last three months, patient should have enough pain med to last until 5/12.  He is completely out.  Also per pharmacist, patient's significant other is on chronic narcotics.  Both get their narcotics on the first day possible at 8AM.   Other red flag: patient has had narcotics filled at two different pharmacies.      Review of Systems     Objective:   Physical ExamShakey and tachycardic.  Mild abd cramping.   Demeanor is polite.        Assessment & Plan:

## 2015-01-30 NOTE — Assessment & Plan Note (Signed)
Symptoms today consistent with withdrawal

## 2015-01-30 NOTE — Patient Instructions (Signed)
You will not get any early refills from this office for any reason.   Any future requests for early refills will be a violation of your pain contract and we will stop prescribing controled substances.

## 2015-01-30 NOTE — Telephone Encounter (Signed)
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 Gwendolyn Grant regarding this patient. She would like for it to be noted that not only does he utilize the orange card for this script here but he also has an insurance plan at [facility] that he is using to fill this script. She expresses that she goes through issues with this patient every month and that honestly her "patience is running thin". When available, could Dr. Gwendolyn Grant call the outpatient pharmacy and speak with Lanora Manis or another pharmacist regarding these concerns? / Thanks Dorothey Baseman, ASA

## 2015-02-05 NOTE — Telephone Encounter (Signed)
Pharmacist concerns noted.   Will discuss with Trevor Thomas at his next visit.  He unfortunately does appear to be showing signs of opioid dependence.

## 2015-02-09 ENCOUNTER — Other Ambulatory Visit: Payer: Self-pay | Admitting: Family Medicine

## 2015-03-08 ENCOUNTER — Emergency Department (HOSPITAL_COMMUNITY): Payer: Self-pay

## 2015-03-08 ENCOUNTER — Encounter (HOSPITAL_COMMUNITY): Payer: Self-pay | Admitting: Emergency Medicine

## 2015-03-08 ENCOUNTER — Emergency Department (HOSPITAL_COMMUNITY)
Admission: EM | Admit: 2015-03-08 | Discharge: 2015-03-08 | Disposition: A | Discharge status: Home / Self Care | Payer: Self-pay | Attending: Emergency Medicine | Admitting: Emergency Medicine

## 2015-03-08 DIAGNOSIS — S82101A Unspecified fracture of upper end of right tibia, initial encounter for closed fracture: Secondary | ICD-10-CM | POA: Insufficient documentation

## 2015-03-08 DIAGNOSIS — F329 Major depressive disorder, single episode, unspecified: Secondary | ICD-10-CM | POA: Insufficient documentation

## 2015-03-08 DIAGNOSIS — W19XXXA Unspecified fall, initial encounter: Secondary | ICD-10-CM

## 2015-03-08 DIAGNOSIS — Z79899 Other long term (current) drug therapy: Secondary | ICD-10-CM | POA: Insufficient documentation

## 2015-03-08 DIAGNOSIS — Y939 Activity, unspecified: Secondary | ICD-10-CM | POA: Insufficient documentation

## 2015-03-08 DIAGNOSIS — J449 Chronic obstructive pulmonary disease, unspecified: Secondary | ICD-10-CM | POA: Insufficient documentation

## 2015-03-08 DIAGNOSIS — Z8719 Personal history of other diseases of the digestive system: Secondary | ICD-10-CM | POA: Insufficient documentation

## 2015-03-08 DIAGNOSIS — R55 Syncope and collapse: Secondary | ICD-10-CM | POA: Insufficient documentation

## 2015-03-08 DIAGNOSIS — S3992XA Unspecified injury of lower back, initial encounter: Secondary | ICD-10-CM | POA: Insufficient documentation

## 2015-03-08 DIAGNOSIS — Y9289 Other specified places as the place of occurrence of the external cause: Secondary | ICD-10-CM | POA: Insufficient documentation

## 2015-03-08 DIAGNOSIS — S0990XA Unspecified injury of head, initial encounter: Secondary | ICD-10-CM | POA: Insufficient documentation

## 2015-03-08 DIAGNOSIS — F419 Anxiety disorder, unspecified: Secondary | ICD-10-CM | POA: Insufficient documentation

## 2015-03-08 DIAGNOSIS — I10 Essential (primary) hypertension: Secondary | ICD-10-CM | POA: Insufficient documentation

## 2015-03-08 DIAGNOSIS — Y999 Unspecified external cause status: Secondary | ICD-10-CM | POA: Insufficient documentation

## 2015-03-08 DIAGNOSIS — S29001A Unspecified injury of muscle and tendon of front wall of thorax, initial encounter: Secondary | ICD-10-CM | POA: Insufficient documentation

## 2015-03-08 DIAGNOSIS — S199XXA Unspecified injury of neck, initial encounter: Secondary | ICD-10-CM | POA: Insufficient documentation

## 2015-03-08 DIAGNOSIS — Z8739 Personal history of other diseases of the musculoskeletal system and connective tissue: Secondary | ICD-10-CM | POA: Insufficient documentation

## 2015-03-08 DIAGNOSIS — W01198A Fall on same level from slipping, tripping and stumbling with subsequent striking against other object, initial encounter: Secondary | ICD-10-CM | POA: Insufficient documentation

## 2015-03-08 DIAGNOSIS — S82891A Other fracture of right lower leg, initial encounter for closed fracture: Secondary | ICD-10-CM

## 2015-03-08 DIAGNOSIS — Z720 Tobacco use: Secondary | ICD-10-CM | POA: Insufficient documentation

## 2015-03-08 LAB — CBC
HCT: 41.5 % (ref 39.0–52.0)
HEMOGLOBIN: 13.8 g/dL (ref 13.0–17.0)
MCH: 30.1 pg (ref 26.0–34.0)
MCHC: 33.3 g/dL (ref 30.0–36.0)
MCV: 90.4 fL (ref 78.0–100.0)
PLATELETS: 232 10*3/uL (ref 150–400)
RBC: 4.59 MIL/uL (ref 4.22–5.81)
RDW: 13.2 % (ref 11.5–15.5)
WBC: 7.1 10*3/uL (ref 4.0–10.5)

## 2015-03-08 LAB — TROPONIN I: Troponin I: <0.03 ng/mL (ref <0.031)

## 2015-03-08 LAB — COMPREHENSIVE METABOLIC PANEL
ALBUMIN: 3.4 g/dL — AB (ref 3.5–5.0)
ALT: 19 U/L (ref 17–63)
ANION GAP: 9 (ref 5–15)
AST: 21 U/L (ref 15–41)
Alkaline Phosphatase: 81 U/L (ref 38–126)
BUN: 16 mg/dL (ref 6–20)
CO2: 28 mmol/L (ref 22–32)
Calcium: 8.7 mg/dL — ABNORMAL LOW (ref 8.9–10.3)
Chloride: 98 mmol/L — ABNORMAL LOW (ref 101–111)
Creatinine, Ser: 0.79 mg/dL (ref 0.61–1.24)
GFR calc non Af Amer: >60 mL/min (ref >60)
Glucose, Bld: 124 mg/dL — ABNORMAL HIGH (ref 65–99)
POTASSIUM: 3.6 mmol/L (ref 3.5–5.1)
SODIUM: 135 mmol/L (ref 135–145)
TOTAL PROTEIN: 6.4 g/dL — AB (ref 6.5–8.1)
Total Bilirubin: 0.2 mg/dL — ABNORMAL LOW (ref 0.3–1.2)

## 2015-03-08 LAB — D-DIMER, QUANTITATIVE: D-Dimer, Quant: <0.27 ug/mL-FEU (ref 0.00–0.48)

## 2015-03-08 MED ORDER — HYDROMORPHONE HCL 1 MG/ML IJ SOLN
1.0000 mg | Freq: Once | INTRAMUSCULAR | Status: AC
  Administered 2015-03-08: 1 mg via INTRAVENOUS
  Filled 2015-03-08: qty 1

## 2015-03-08 MED ORDER — FENTANYL CITRATE (PF) 100 MCG/2ML IJ SOLN
75.0000 ug | Freq: Once | INTRAMUSCULAR | Status: AC
  Administered 2015-03-08: 75 ug via INTRAVENOUS
  Filled 2015-03-08: qty 2

## 2015-03-08 MED ORDER — OXYCODONE-ACETAMINOPHEN 5-325 MG PO TABS
2.0000 | ORAL_TABLET | Freq: Once | ORAL | Status: DC
  Filled 2015-03-08: qty 2

## 2015-03-08 MED ORDER — OXYCODONE-ACETAMINOPHEN 5-325 MG PO TABS
2.0000 | ORAL_TABLET | Freq: Once | ORAL | Status: AC
  Administered 2015-03-08: 2 via ORAL

## 2015-03-08 MED ORDER — SODIUM CHLORIDE 0.9 % IV BOLUS (SEPSIS)
1000.0000 mL | Freq: Once | INTRAVENOUS | Status: AC
  Administered 2015-03-08: 1000 mL via INTRAVENOUS

## 2015-03-08 NOTE — Progress Notes (Signed)
Orthopedic Tech Progress Note Patient Details:  Jeiren Mirchandani Jun 07, 1966 [id number] Applied Velcro knee immobilizer to RLE.  Pulses, sensation, motion intact before and after application.  Capillary refill less than 2 seconds before and after application.  Fit pt. for crutches and taught use of same. Ortho Devices Type of Ortho Device: Knee Immobilizer, Crutches Ortho Device/Splint Location: RLE Ortho Device/Splint Interventions: Application   Lesle Chris 03/08/2015, 10:47 PM

## 2015-03-08 NOTE — ED Provider Notes (Signed)
CSN: [id number]     Arrival date & time 03/08/15  1919 History   First MD Initiated Contact with Patient 03/08/15 1919     Chief Complaint  Patient presents with  . Fall  . Neck Pain     (Consider location/radiation/quality/duration/timing/severity/associated sxs/prior Treatment) Patient is a 49 y.o. male presenting with fall.  Fall This is a new problem. The current episode started today. Episode frequency: once. The problem has been resolved. Associated symptoms include chest pain (early this morning, vague feeling), headaches and joint swelling (right knee). Pertinent negatives include no abdominal pain, chills, congestion, coughing, diaphoresis, fatigue, fever, myalgias, nausea, numbness, rash, sore throat, urinary symptoms, visual change, vomiting or weakness. Associated symptoms comments: Pre-syncope. Exacerbated by: nothing tried. Treatments tried: nothing tried.    Past Medical History  Diagnosis Date  . Hypertension   . COPD (chronic obstructive pulmonary disease)   . Bronchitis     hx of  . Asthma   . Concussion 1983  . Headache(784.0)   . Anxiety   . Depression   . Umbilical hernia   . GERD (gastroesophageal reflux disease)     takes tums/rolaids  . Arthritis   . Head trauma   . 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 Knee surgery    . Thumb surgery (left)    . Umbilical hernia repair  05/15/2012    Procedure: HERNIA REPAIR UMBILICAL ADULT;  Surgeon: Clovis Pu. Cornett, MD;  Location: [facility] OR;  Service: General;  Laterality: N/A;  . Hernia repair  05/2012    umbilical hernia  . Eye surgery  20 years ago    Metal foreign body removed from Right eye  . Esophagogastroduodenoscopy N/A 08/25/2014    Procedure: ESOPHAGOGASTRODUODENOSCOPY (EGD);  Surgeon: Louis Meckel, MD;  Location: [facility] ENDOSCOPY;  Service: Endoscopy;  Laterality: N/A;   Family History  Problem Relation Age of  Onset  . Diabetes Mother   . Diabetes Father   . Heart failure Brother    History  Substance Use Topics  . Smoking status: Current Every Day Smoker -- 0.75 packs/day for 20 years    Types: Cigarettes  . Smokeless tobacco: Never Used  . Alcohol Use: No     Comment: rarely  - quit approx. June 2013    Review of Systems  Constitutional: Negative for fever, chills, diaphoresis, appetite change and fatigue.  HENT: Negative for congestion, ear pain, facial swelling, mouth sores and sore throat.   Eyes: Negative for visual disturbance.  Respiratory: Negative for cough, chest tightness and shortness of breath.   Cardiovascular: Positive for chest pain (early this morning, vague feeling). Negative for palpitations.  Gastrointestinal: Negative for nausea, vomiting, abdominal pain, diarrhea and blood in stool.  Endocrine: Negative for cold intolerance and heat intolerance.  Genitourinary: Negative for frequency, decreased urine volume and difficulty urinating.  Musculoskeletal: Positive for joint swelling (right knee). Negative for myalgias, back pain and neck stiffness.  Skin: Negative for rash.  Neurological: Positive for dizziness, light-headedness and headaches. Negative for weakness and numbness.  All other systems reviewed and are negative.     Allergies  Bee venom  Home Medications   Prior to Admission medications   Medication Sig Start Date End Date Taking? Authorizing Provider  citalopram (CELEXA) 40 MG tablet Take 1 tablet (40 mg total) by mouth daily. 01/23/15  Yes Tobey Grim, MD  lisinopril-hydrochlorothiazide (  PRINZIDE,ZESTORETIC) 20-25 MG per tablet TAKE ONE TABLET BY MOUTH IN THE MORNING 01/14/15  Yes Tobey Grim, MD  loratadine (CLARITIN) 10 MG tablet Take 1 tablet (10 mg total) by mouth every morning. 01/14/15  Yes Tobey Grim, MD  LORazepam (ATIVAN) 0.5 MG tablet Take 1 tablet (0.5 mg total) by mouth at bedtime as needed for anxiety. 10/28/14  Yes Tobey Grim, MD  oxyCODONE-acetaminophen (ROXICET) 5-325 MG per tablet Take 1 tablet by mouth every 8 (eight) hours as needed for severe pain. Ok to fill early today 01/23/15 01/23/15  Yes Tobey Grim, MD  traMADol (ULTRAM) 50 MG tablet TAKE ONE TABLET BY MOUTH AT BEDTIME AS NEEDED FOR  RESTLESS  LEG  SYNDROME 02/10/15  Yes Tobey Grim, MD  albuterol (PROVENTIL HFA;VENTOLIN HFA) 108 (90 BASE) MCG/ACT inhaler Inhale 2 puffs into the lungs every 6 (six) hours as needed for wheezing. 09/02/14   Tobey Grim, MD  baclofen (LIORESAL) 10 MG tablet Take 1 tablet (10 mg total) by mouth 3 (three) times daily. Patient not taking: Reported on 03/08/2015 09/17/14   Tobey Grim, MD  ferrous sulfate 325 (65 FE) MG tablet Take 1 tablet (325 mg total) by mouth daily with breakfast. Patient not taking: Reported on 03/08/2015 09/02/14   Tobey Grim, MD  oxyCODONE-acetaminophen (ROXICET) 5-325 MG per tablet Take 1 tablet by mouth every 8 (eight) hours as needed for severe pain. Do not fill until 28 days from this date Patient not taking: Reported on 03/08/2015 01/23/15   Tobey Grim, MD   BP 117/71 mmHg  Pulse 97  Temp(Src) 98.3 F (36.8 C) (Oral)  Resp 19  Ht 5\' 11"  (1.803 m)  Wt 260 lb (117.935 kg)  BMI 36.28 kg/m2  SpO2 96% Physical Exam  Constitutional: He is oriented to person, place, and time. He appears well-developed and well-nourished. No distress. Cervical collar and backboard in place.  HENT:  Head: Normocephalic.  Right Ear: External ear normal.  Left Ear: External ear normal.  Mouth/Throat: Oropharynx is clear and moist.  Eyes: Conjunctivae and EOM are normal. Pupils are equal, round, and reactive to light. Right eye exhibits no discharge. Left eye exhibits no discharge. No scleral icterus.  Neck: Normal range of motion. Neck supple.  Cardiovascular: Regular rhythm and normal heart sounds.  Exam reveals no gallop and no friction rub.   No murmur heard. Pulses:      Radial pulses  are 2+ on the right side, and 2+ on the left side.       Dorsalis pedis pulses are 2+ on the right side, and 2+ on the left side.  Pulmonary/Chest: Effort normal and breath sounds normal. No stridor. No respiratory distress.  Abdominal: Soft. He exhibits no distension. There is no tenderness.  Musculoskeletal:       Right knee: Tenderness found.       Cervical back: He exhibits bony tenderness.       Thoracic back: He exhibits no bony tenderness.       Lumbar back: He exhibits bony tenderness.  Clavicle stable. Chest stable to AP/Lat compression Pelvis stable to Lat compression No obvious extremity deformity  Neurological: He is alert and oriented to person, place, and time. GCS eye subscore is 4. GCS verbal subscore is 5. GCS motor subscore is 6.  Moving all extremities   Skin: Skin is warm. He is not diaphoretic.    ED Course  Procedures (including critical care time) Labs  Review Labs Reviewed  COMPREHENSIVE METABOLIC PANEL - Abnormal; Notable for the following:    Chloride 98 (*)    Glucose, Bld 124 (*)    Calcium 8.7 (*)    Total Protein 6.4 (*)    Albumin 3.4 (*)    Total Bilirubin 0.2 (*)    All other components within normal limits  CBC  TROPONIN I  D-DIMER, QUANTITATIVE (NOT AT [facility])    Imaging Review Dg Chest 1 View  03/08/2015   CLINICAL DATA:  Larey Seat.  Chest pain.  EXAM: CHEST  1 VIEW  COMPARISON:  03/13/2014  FINDINGS: The heart is borderline enlarged. The mediastinal and hilar contours are within normal limits given the AP projection. The lungs are clear of acute process. Stable basilar scarring changes. No pleural effusion or pneumothorax. The bony thorax is intact.  IMPRESSION: No acute cardiopulmonary findings.   Electronically Signed   By: Rudie Meyer M.D.   On: 03/08/2015 20:40   Dg Lumbar Spine 2-3 Views  03/08/2015   CLINICAL DATA:  Low back pain secondary to a fall today.  EXAM: LUMBAR SPINE - 2-3 VIEW  COMPARISON:  03/28/2013  FINDINGS: There is fracture  or disc space narrowing. No subluxation. The patient does have pars defects at L5, unchanged.  IMPRESSION: No acute abnormality.  Bilateral pars defects at L5.   Electronically Signed   By: Francene Boyers M.D.   On: 03/08/2015 20:41   Ct Head Wo Contrast  03/08/2015   CLINICAL DATA:  Dizzy while standing. Fell into garden tub. Patient has headache and posterior neck pain.  EXAM: CT HEAD WITHOUT CONTRAST  CT CERVICAL SPINE WITHOUT CONTRAST  TECHNIQUE: Multidetector CT imaging of the head and cervical spine was performed following the standard protocol without intravenous contrast. Multiplanar CT image reconstructions of the cervical spine were also generated.  COMPARISON:  CT head 08/25/2014.  I DA out a at a  FINDINGS: CT HEAD FINDINGS  No evidence for acute infarction, hemorrhage, mass lesion, hydrocephalus, or extra-axial fluid. No atrophy or white matter disease. Intact calvarium. No acute sinus or mastoid disease.  CT CERVICAL SPINE FINDINGS  There is no visible cervical spine fracture, traumatic subluxation, prevertebral soft tissue swelling, or intraspinal hematoma. Slight reversal normal cervical lordotic curve. Advanced facet arthropathy is worst at C3-4 on the RIGHT. Minor annular calcification C4-C5. No neck masses. Atherosclerosis. Lung apices clear.  IMPRESSION: No skull fracture or intracranial hemorrhage.  No cervical spine fracture or traumatic subluxation. Degenerative changes as described.   Electronically Signed   By: Davonna Belling M.D.   On: 03/08/2015 20:54   Ct Cervical Spine Wo Contrast  03/08/2015   CLINICAL DATA:  Dizzy while standing. Fell into garden tub. Patient has headache and posterior neck pain.  EXAM: CT HEAD WITHOUT CONTRAST  CT CERVICAL SPINE WITHOUT CONTRAST  TECHNIQUE: Multidetector CT imaging of the head and cervical spine was performed following the standard protocol without intravenous contrast. Multiplanar CT image reconstructions of the cervical spine were also generated.   COMPARISON:  CT head 08/25/2014.  I DA out a at a  FINDINGS: CT HEAD FINDINGS  No evidence for acute infarction, hemorrhage, mass lesion, hydrocephalus, or extra-axial fluid. No atrophy or white matter disease. Intact calvarium. No acute sinus or mastoid disease.  CT CERVICAL SPINE FINDINGS  There is no visible cervical spine fracture, traumatic subluxation, prevertebral soft tissue swelling, or intraspinal hematoma. Slight reversal normal cervical lordotic curve. Advanced facet arthropathy is worst at C3-4 on the  RIGHT. Minor annular calcification C4-C5. No neck masses. Atherosclerosis. Lung apices clear.  IMPRESSION: No skull fracture or intracranial hemorrhage.  No cervical spine fracture or traumatic subluxation. Degenerative changes as described.   Electronically Signed   By: Davonna Belling M.D.   On: 03/08/2015 20:54   Dg Knee Ap/lat W/sunrise Right  03/08/2015   CLINICAL DATA:  Patient states while walking today is RIGHT leg gave out cause 10 both fall.  EXAM: RIGHT KNEE 3 VIEWS  COMPARISON:  Two-view knee 02/17/2011.  FINDINGS: Suspected nondisplaced fracture of the lateral intercondylar eminence. This cannot be confirmed as there are no oblique images. Medial intercondylar eminence appears intact. Patella is intact. There is a large joint effusion.  IMPRESSION: Suspected nondisplaced fracture of the lateral intercondylar eminence of the tibia. Large joint effusion.   Electronically Signed   By: Davonna Belling M.D.   On: 03/08/2015 20:43     EKG Interpretation   Date/Time:  Sunday March 08 2015 19:36:13 EDT Ventricular Rate:  86 PR Interval:  154 QRS Duration: 113 QT Interval:  380 QTC Calculation: 454 R Axis:   69 Text Interpretation:  Sinus rhythm Borderline intraventricular conduction  delay ST elev, probable normal early repol pattern No significant change  since last tracing Confirmed by Mirian Mo [phone]) on 03/08/2015  7:41:58 PM      MDM   49 year old male with a history of  hypertension, COPD, chronic back pain on narcotics who presents to the ED with presyncope while standing up after using the bathroom. Patient reported feeling lightheaded and off balance and falling into the toilet hitting his head and neck on the way down. He denied LOC or amnesia to the event. Patient complaining of headache and neck pain lower back pain. Patient also reports that he has had 2 days of worsening right knee pain and swelling after hearing a pop. This was not related to the fall however. Patient denied chest pain immediately prior to the presyncopal episode however he did endorse chest pain earlier in the day and described as vague. History and exam as above.  EKG with normal sinus rhythm at a rate of 86. Normal intervals and axis. Borderline ST elevation favoring repolarization. No evidence of acute ischemia arrhythmia or blocks. Troponin and d-dimer negative. Hemoglobin stable. Imaging of the head and neck lumbar spine were negative for acute injuries. Plain film of the right knee with possible nondisplaced lateral intercondylar eminence fracture of the tibia.  Patient provided with symptomatic pain medication.  Orthopedic surgery contacted and recommended putting the patient in a knee immobilizer. They will be receiving the patient in clinic sometime this week.  Cervical spine cleared using Nexus criteria.  Patient sent for discharge and given strict return precautions. Patient provided with crutches and knee immobilizer here in the ED. Patient to follow-up with orthopedic surgery. Next  Patient discharged in good condition.  Patient seen in conjunction with Dr. Littie Deeds.  Deniece Portela, MD. Resident   Final diagnoses:  Knee fracture, right, closed, initial encounter  Fall, initial encounter  Vasovagal syncope        Drema Pry, MD 03/08/15 2313  Mirian Mo, MD 03/10/15 2351

## 2015-03-08 NOTE — ED Notes (Signed)
Per ems report- called out to patients house d/t patient using restroom and standing from toilet, becoming dizzy, and falling forward striking neck on edge of tub; pt denies LOC or anticoagulant use; pt was able to get self from bathroom to bedroom where he laid on bed until ems arrival; fentanyl given by ems;  pt was placed in c-collar by ems on arrival; pt also c/o right leg pain "for a while"- pt reports hearing a "pop"; RLE edema noted- positive pulses distally

## 2015-03-08 NOTE — ED Notes (Signed)
Pt returned from xray  Iv nss bolkus  And pain med given

## 2015-03-08 NOTE — Discharge Instructions (Signed)
Knee Fracture, Adult A knee fracture is a break in any of the bones of the lower part of the thigh bone, the upper part of the bones of the lower leg, or of the kneecap. When the bones no longer meet the way they are supposed to it is called a dislocation. Sometimes there can be a dislocation along with fractures. SYMPTOMS  Symptoms may include pain, swelling, inability to bend the knee, deformity of the knee, and inability to walk.  DIAGNOSIS  This problem is usually diagnosed with x-rays. Special studies are sometimes done if a fracture is suspected but cannot be seen on ordinary x-rays. If vessels around the knee are injured, special tests may be done to see what the damage is. TREATMENT   The leg is usually splinted for the first couple of days to allow for swelling. After the swelling is down a cast is put on. Sometimes a cast is put on right away with the sides of the cast cut to allow the knee to swell. If the bones are in place, this may be all that is needed.  If the bones are out of place, medications for pain are given to allow them to be put back in place. If they are seriously out of place, surgery may be needed to hold the pieces or breaks in place using wires, pins, screws or metal plates.  Generally most fractures will heal in 4 to 6 weeks. HOME CARE INSTRUCTIONS   Use your crutches as directed.  To lessen the swelling, keep the injured leg elevated while sitting or lying down.  Apply ice to the injury for 15-20 minutes, 03-04 times per day while awake for 2 days. Put the ice in a plastic bag and place a thin towel between the bag of ice and your cast.  If you have a plaster or fiberglass cast:  Do not try to scratch the skin under the cast using sharp or pointed objects.  Check the skin around the cast every day. You may put lotion on any red or sore areas.  Keep your cast dry and clean.  If you have a plaster splint:  Wear the splint as directed.  You may loosen the  elastic around the splint if your toes become numb, tingle, or turn cold or blue.  Do not put pressure on any part of your cast or splint; it may break. Rest your cast only on a pillow the first 24 hours until it is fully hardened.  Your cast or splint can be protected during bathing with a plastic bag. Do not lower the cast or splint into water.  Only take over-the-counter or prescription medicines for pain, discomfort, or fever as directed by your caregiver.  See your caregiver soon if your cast gets damaged or breaks.  It is very important to keep all follow up appointments. Not following up as directed may result in a worsening of your condition or a failure of the fracture to heal properly. SEEK IMMEDIATE MEDICAL CARE IF:  You have continued severe pain.  You have more swelling than you did before the cast was put on.  The area below the fracture becomes painful.  Your skin or toenails below the injury turn blue or gray, or feel cold or numb.  There is drainage coming from under the cast.  New, unexplained symptoms develop (drugs used in treatment may produce side effects). MAKE SURE YOU:   Understand these instructions.  Will watch your condition.  Will   get help right away if you are not doing well or get worse. Document Released: 08/02/2006 Document Revised: 12/12/2011 Document Reviewed: 09/03/2007 [facility] Patient Information 2015 [place], [place]. This information is not intended to replace advice given to you by your health care provider. Make sure you discuss any questions you have with your health care provider.  Knee Bracing Knee braces are supports to help stabilize and protect an injured or painful knee. They come in many different styles. They should support and protect the knee without increasing the chance of other injuries to yourself or others. It is important not to have a false sense of security when using a brace. Knee braces that help you to keep using your  knee:  Do not restore normal knee stability under high stress forces.  May decrease some aspects of athletic performance. Some of the different types of knee braces are:  Prophylactic knee braces are designed to prevent or reduce the severity of knee injuries during sports that make injury to the knee more likely.  Rehabilitative knee braces are designed to allow protected motion of:  Injured knees.  Knees that have been treated with or without surgery. There is no evidence that the use of a supportive knee brace protects the graft following a successful anterior cruciate ligament (ACL) reconstruction. However, braces are sometimes used to:   Protect injured ligaments.  Control knee movement during the initial healing period. They may be used as part of the treatment program for the various injured ligaments or cartilage of the knee including the:  Anterior cruciate ligament.  Medial collateral ligament.  Medial or lateral cartilage (meniscus).  Posterior cruciate ligament.  Lateral collateral ligament. Rehabilitative knee braces are most commonly used:  During crutch-assisted walking right after injury.  During crutch-assisted walking right after surgery to repair the cartilage and/or cruciate ligament injury.  For a short period of time, 2-8 weeks, after the injury or surgery. The value of a rehabilitative brace as opposed to a cast or splint includes the:  Ability to adjust the brace for swelling.  Ability to remove the brace for examinations, icing, or showering.  Ability to allow for movement in a controlled range of motion. Functional knee braces give support to knees that have already been injured. They are designed to provide stability for the injured knee and provide protection after repair. Functional knee braces may not affect performance much. Lower extremity muscle strengthening, flexibility, and improvement in technique are more important than bracing in  treating ligamentous knee injuries. Functional braces are not a substitute for rehabilitation or surgical procedures. Unloader/off-loader braces are designed to provide pain relief in arthritic knees. Patients with wear and tear arthritis from growing old or from an old cartilage injury (osteoarthritis) of the knee, and bowlegged (varus) or knock-knee (valgus) deformities, often develop increased pain in the arthritic side due to increased loading. Unloader/off-loader braces are made to reduce uneven loading in such knees. There is reduction in bowing out movement in bowlegged knees when the correct unloader brace is used. Patients with advanced osteoarthritis or severe varus or valgus alignment problems would not likely benefit from bracing. Patellofemoral braces help the kneecap to move smoothly and well centered over the end of the femur in the knee.  Most people who wear knee braces feel that they help. However, there is a lack of scientific evidence that knee braces are helpful at the level needed for athletic participation to prevent injury. In spite of this, athletes report an increase in  knee stability, pain relief, performance improvement, and confidence during athletics when using a brace.  Different knee problems require different knee braces:  Your caregiver may suggest one kind of knee brace after knee surgery.  A caregiver may choose another kind of knee brace for support instead of surgery for some types of torn ligaments.  You may also need one for pain in the front of your knee that is not getting better with strengthening and flexibility exercises. Get your caregiver's advice if you want to try a knee brace. The caregiver will advise you on where to get them and provide a prescription when it is needed to fashion and/or fit the brace. Knee braces are the least important part of preventing knee injuries or getting better following injury. Stretching, strengthening and technique  improvement are far more important in caring for and preventing knee injuries. When strengthening your knee, increase your activities a little at a time so as not to develop injuries from overuse. Work out an exercise plan with your caregiver and/or physical therapist to get the best program for you. Do not let a knee brace become a crutch. Always remember, there are no braces which support the knee as well as your original ligaments and cartilage you were born with. Conditioning, proper warm-up, and stretching remain the most important parts of keeping your knees healthy. HOW TO USE A KNEE BRACE  During sports, knee braces should be used as directed by your caregiver.  Make sure that the hinges are where the knee bends.  Straps, tapes, or hook-and-loop tapes should be fastened around your leg as instructed.  You should check the placement of the brace during activities to make sure that it has not moved. Poorly positioned braces can hurt rather than help you.  To work well, a knee brace should be worn during all activities that put you at risk of knee injury.  Warm up properly before beginning athletic activities. HOME CARE INSTRUCTIONS  Knee braces often get damaged during normal use. Replace worn-out braces for maximum benefit.  Clean regularly with soap and water.  Inspect your brace often for wear and tear.  Cover exposed metal to protect others from injury.  Durable materials may cost more, but last longer. SEEK IMMEDIATE MEDICAL CARE IF:   Your knee seems to be getting worse rather than better.  You have increasing pain or swelling in the knee.  You have problems caused by the knee brace.  You have increased swelling or inflammation (redness or soreness) in your knee.  Your knee becomes warm and more painful and you develop an unexplained temperature over 101F (38.3C). MAKE SURE YOU:   Understand these instructions.  Will watch your condition.  Will get help right  away if you are not doing well or get worse. See your caregiver, physical therapist, or orthopedic surgeon for additional information. Document Released: 12/10/2003 Document Revised: 02/03/2014 Document Reviewed: 03/18/2009 [facility] Patient Information 2015 [place], [place]. This information is not intended to replace advice given to you by your health care provider. Make sure you discuss any questions you have with your health care provider.

## 2015-03-10 ENCOUNTER — Telehealth: Payer: Self-pay | Admitting: Family Medicine

## 2015-03-10 DIAGNOSIS — S82109A Unspecified fracture of upper end of unspecified tibia, initial encounter for closed fracture: Secondary | ICD-10-CM

## 2015-03-10 NOTE — Telephone Encounter (Signed)
Was in ED on Sunday. Has a broken knee cap Was told to see Dr Gwendolyn Grant but there are no appts available until July Needs to see dr Gwendolyn Grant for a referral to orthopedic because of the "orange card" Please advise

## 2015-03-11 NOTE — Telephone Encounter (Signed)
Thank you for working on this.  

## 2015-03-11 NOTE — Telephone Encounter (Signed)
Not sure how this works as the patient has the [employer].  Will place referral to [facility] (Dr. Dion Saucier was orthopedist on call that day).  [place]enton

## 2015-03-11 NOTE — Telephone Encounter (Signed)
No slots for orthopedic with the orange. Patient also has [employer]. Will send referral to [facility] who see patient with [facility].

## 2015-03-13 ENCOUNTER — Other Ambulatory Visit (HOSPITAL_COMMUNITY): Payer: Self-pay | Admitting: Orthopaedic Surgery

## 2015-03-13 DIAGNOSIS — M25561 Pain in right knee: Secondary | ICD-10-CM

## 2015-03-25 ENCOUNTER — Ambulatory Visit (HOSPITAL_COMMUNITY)
Admission: RE | Admit: 2015-03-25 | Discharge: 2015-03-25 | Disposition: A | Discharge status: Home / Self Care | Payer: Self-pay | Source: Ambulatory Visit | Attending: Orthopaedic Surgery | Admitting: Orthopaedic Surgery

## 2015-03-25 DIAGNOSIS — M7121 Synovial cyst of popliteal space [Baker], right knee: Secondary | ICD-10-CM | POA: Insufficient documentation

## 2015-03-25 DIAGNOSIS — M25561 Pain in right knee: Secondary | ICD-10-CM | POA: Insufficient documentation

## 2015-03-25 DIAGNOSIS — M25461 Effusion, right knee: Secondary | ICD-10-CM | POA: Insufficient documentation

## 2015-03-25 DIAGNOSIS — R937 Abnormal findings on diagnostic imaging of other parts of musculoskeletal system: Secondary | ICD-10-CM | POA: Insufficient documentation

## 2015-03-25 DIAGNOSIS — M659 Synovitis and tenosynovitis, unspecified: Secondary | ICD-10-CM | POA: Insufficient documentation

## 2015-03-30 ENCOUNTER — Telehealth: Payer: Self-pay | Admitting: Family Medicine

## 2015-03-30 NOTE — Telephone Encounter (Signed)
Mr. Karau is calling because he needs a refill on his oxyCODONE-acetaminophen (ROXICET) 5-325 MG. Thank you, Dorothey Baseman, ASA

## 2015-03-31 ENCOUNTER — Telehealth: Payer: Self-pay | Admitting: *Deleted

## 2015-03-31 MED ORDER — OXYCODONE-ACETAMINOPHEN 5-325 MG PO TABS: 1.0000 | ORAL_TABLET | Freq: Three times a day (TID) | ORAL | Status: DC | PRN

## 2015-03-31 NOTE — Telephone Encounter (Signed)
Pt called about his refill. He is completely out of pain med Would like to talk to Dr Gwendolyn Grant about an incident that happened here April 22

## 2015-03-31 NOTE — Telephone Encounter (Signed)
Lanora Manis from [facility] wanting to make PCP aware that patient got a Rx for Vicodin 7.5-325, 20 day supply filled at [facility] on 03/12/15.  The medication was prescribed by Zonia Kief, PA.  Clovis Pu, RN

## 2015-03-31 NOTE — Telephone Encounter (Signed)
Refill provided today as expected on the 28th of each month.  Will call and discuss with [facility] April 22.

## 2015-04-01 NOTE — Telephone Encounter (Signed)
Thank you for pointing this out and having the pharmacist contact our clinic.  This is a clear violation of the patient's pain contract.  We will no longer be able to provide narcotics from our practice.  I will discuss this with the patient.

## 2015-04-01 NOTE — Telephone Encounter (Signed)
See note from Tamika regarding last rx filled by this patient.  I spoke with Brett Canales at our outpt pharmacy regarding this individual and was informed that he had used insurance to obtain a different pain medication a different pharmacy.  I called [facility] and was able to establish that patient paid in cash the Vicodin, but the ins paid part of that as well.

## 2015-04-07 ENCOUNTER — Emergency Department (HOSPITAL_COMMUNITY): Payer: Self-pay

## 2015-04-07 ENCOUNTER — Emergency Department (HOSPITAL_COMMUNITY)
Admission: EM | Admit: 2015-04-07 | Discharge: 2015-04-07 | Discharge status: Against Medical Advice | Payer: Self-pay | Attending: Emergency Medicine | Admitting: Emergency Medicine

## 2015-04-07 ENCOUNTER — Encounter (HOSPITAL_COMMUNITY): Payer: Self-pay

## 2015-04-07 DIAGNOSIS — I10 Essential (primary) hypertension: Secondary | ICD-10-CM | POA: Insufficient documentation

## 2015-04-07 DIAGNOSIS — R531 Weakness: Secondary | ICD-10-CM | POA: Insufficient documentation

## 2015-04-07 DIAGNOSIS — Z720 Tobacco use: Secondary | ICD-10-CM | POA: Insufficient documentation

## 2015-04-07 DIAGNOSIS — J441 Chronic obstructive pulmonary disease with (acute) exacerbation: Secondary | ICD-10-CM | POA: Insufficient documentation

## 2015-04-07 DIAGNOSIS — M797 Fibromyalgia: Secondary | ICD-10-CM | POA: Insufficient documentation

## 2015-04-07 LAB — BASIC METABOLIC PANEL
ANION GAP: 8 (ref 5–15)
BUN: 17 mg/dL (ref 6–20)
CHLORIDE: 102 mmol/L (ref 101–111)
CO2: 28 mmol/L (ref 22–32)
CREATININE: 0.69 mg/dL (ref 0.61–1.24)
Calcium: 9.3 mg/dL (ref 8.9–10.3)
GFR calc non Af Amer: >60 mL/min (ref >60)
Glucose, Bld: 132 mg/dL — ABNORMAL HIGH (ref 65–99)
POTASSIUM: 3.6 mmol/L (ref 3.5–5.1)
Sodium: 138 mmol/L (ref 135–145)

## 2015-04-07 LAB — CBC
HEMATOCRIT: 44.5 % (ref 39.0–52.0)
HEMOGLOBIN: 15 g/dL (ref 13.0–17.0)
MCH: 30.3 pg (ref 26.0–34.0)
MCHC: 33.7 g/dL (ref 30.0–36.0)
MCV: 89.9 fL (ref 78.0–100.0)
Platelets: 255 10*3/uL (ref 150–400)
RBC: 4.95 MIL/uL (ref 4.22–5.81)
RDW: 12.7 % (ref 11.5–15.5)
WBC: 9.2 10*3/uL (ref 4.0–10.5)

## 2015-04-07 LAB — I-STAT TROPONIN, ED: Troponin i, poc: 0 ng/mL (ref 0.00–0.08)

## 2015-04-07 NOTE — ED Notes (Signed)
Pt c/o SOB, weakness, generalized body aches, cough, and headache x 1 week.  Pain score 8/10.  Pt has not taken anything for symptoms.  Pt thinks he was exposed to black mold. Hx of COPD and asthma.

## 2015-04-07 NOTE — ED Notes (Addendum)
This RN surveyed the lobby and checked outside the department w/ no answer.  Will attempt again when another room comes available.

## 2015-04-07 NOTE — ED Notes (Signed)
Attempted to call Pt a final time.  This RN checked lobby and outside w/o an answer.

## 2015-04-07 NOTE — ED Notes (Signed)
Patient was called back three times with no answer

## 2015-04-14 ENCOUNTER — Encounter: Payer: Self-pay | Admitting: Family Medicine

## 2015-04-14 ENCOUNTER — Ambulatory Visit (INDEPENDENT_AMBULATORY_CARE_PROVIDER_SITE_OTHER): Payer: Self-pay | Admitting: Family Medicine

## 2015-04-14 VITALS — BP 128/82 | HR 86 | Temp 98.3°F | Ht 71.0 in | Wt 265.3 lb

## 2015-04-14 DIAGNOSIS — F1920 Other psychoactive substance dependence, uncomplicated: Secondary | ICD-10-CM

## 2015-04-14 DIAGNOSIS — F1120 Opioid dependence, uncomplicated: Secondary | ICD-10-CM

## 2015-04-14 MED ORDER — CLONIDINE HCL 0.2 MG PO TABS: 0.2000 mg | ORAL_TABLET | Freq: Three times a day (TID) | ORAL | Status: DC

## 2015-04-14 MED ORDER — PROMETHAZINE HCL 25 MG PO TABS: 25.0000 mg | ORAL_TABLET | Freq: Three times a day (TID) | ORAL | Status: DC | PRN

## 2015-04-14 NOTE — Patient Instructions (Addendum)
Start taking the Percocet as follows for the remainder of the prescription:  - 1 pill by mouth three times a day until Friday July 16. - Take 1 pill by mouth twice a day until Friday July 22.   - Take 1 pill by mouth daily until the bottle is finished.   Start taking the clonidine 2-3 times a day when you drop down to 2 pills of Percocet.  Take Immodium if you start to have diarrhea, as described on the box.    Take the Phenergan if you feel nauseous.    I have referred you to Physical Medicine for your back pain.  They should call you with an appointment.

## 2015-04-14 NOTE — Assessment & Plan Note (Signed)
No longer filling narcotics here. He has already received letter stating this. Symptomatically treat withdrawal symptoms.  Wrote out and discussed instructions for opioid taper -- see below.  Attempt referral to pain management -- unfortunately they have declined seeing him per Tia.  (noted after patient left)

## 2015-04-14 NOTE — Progress Notes (Signed)
Subjective:    Trevor Thomas is a 49 y.o. male who presents to [facility] today to discuss his pain management contract:  1.  Pain management:  Received letter in mail stating we won't be able to prescribe him narcotics any longer.  Wants to know what he will do 7/28 when Percocet runs out.  Still has pain in back daily.  Relieved by narcotics.  As per prior notes -- patient has both received another narcotic (hydrocodone) from another provider and had his narcotics filled at a separate pharmacy.  Pharmacist from [facility] alerted [place] to this on 03/31/2015.    - Based on the [facility] Database: [facility] filled 90 tablets of Percocet on 6/28, the same day [facility] alerted [place] of the dual prescriptions.  He also received a script for Tramadol on 04/08/2015.    ROS as above per HPI, otherwise neg.    The following portions of the patient's history were reviewed and updated as appropriate: allergies, current medications, past medical history, family and social history, and problem list. Patient is a nonsmoker.   Medications reviewed. Current Outpatient Prescriptions  Medication Sig Dispense Refill  . albuterol (PROVENTIL HFA;VENTOLIN HFA) 108 (90 BASE) MCG/ACT inhaler Inhale 2 puffs into the lungs every 6 (six) hours as needed for wheezing. 8 g 1  . baclofen (LIORESAL) 10 MG tablet Take 1 tablet (10 mg total) by mouth 3 (three) times daily. (Patient not taking: Reported on 03/08/2015) 30 each 0  . citalopram (CELEXA) 40 MG tablet Take 1 tablet (40 mg total) by mouth daily. 30 tablet 3  . ferrous sulfate 325 (65 FE) MG tablet Take 1 tablet (325 mg total) by mouth daily with breakfast. (Patient not taking: Reported on 03/08/2015) 90 tablet 3  . lisinopril-hydrochlorothiazide (PRINZIDE,ZESTORETIC) 20-25 MG per tablet TAKE ONE TABLET BY MOUTH IN THE MORNING 90 tablet 2  . loratadine (CLARITIN) 10 MG tablet Take 1 tablet (10 mg total) by mouth every morning. 30 tablet 11  . LORazepam (ATIVAN) 0.5 MG tablet Take 1  tablet (0.5 mg total) by mouth at bedtime as needed for anxiety. 30 tablet 1  . oxyCODONE-acetaminophen (ROXICET) 5-325 MG per tablet Take 1 tablet by mouth every 8 (eight) hours as needed for severe pain. 90 tablet 0  . traMADol (ULTRAM) 50 MG tablet TAKE ONE TABLET BY MOUTH AT BEDTIME AS NEEDED FOR  RESTLESS  LEG  SYNDROME 60 tablet 2   No current facility-administered medications for this visit.     Objective:   Physical Exam BP 128/82 mmHg  Pulse 86  Temp(Src) 98.3 F (36.8 C) (Oral)  Ht 5\' 11"  (1.803 m)  Wt 265 lb 4.8 oz (120.339 kg)  BMI 37.02 kg/m2 Gen:  Alert, cooperative patient who appears stated age in no acute distress.  Vital signs reviewed. MSK:  RIght knee with effusion present.  Psych:  Pleasant demeanor.   Skin:  Chills noted on arms.   No results found for this or any previous visit (from the past 72 hour(s)).

## 2015-04-21 ENCOUNTER — Telehealth: Payer: Self-pay | Admitting: Family Medicine

## 2015-04-21 NOTE — Telephone Encounter (Signed)
Needs the number for the pain mgt clinic dr Gwendolyn Grant referred him to. He was here last weelk He is having a lot of issues right now.  Please advise

## 2015-04-23 NOTE — Telephone Encounter (Signed)
Will forward to MD and Tia Hill.  Per referral note it stated to possibly consider sending patient to the [facility].  Will check to see if this will happen or what status is of patient finding pain clinic.  Jazmin Hartsell,CMA

## 2015-04-23 NOTE — Telephone Encounter (Signed)
Dr. Gwendolyn Grant spoke to me about this patient but new referral was not placed. Will send to Dr. Gwendolyn Grant to see what info he gave to patient.

## 2015-04-24 NOTE — Telephone Encounter (Signed)
[facility] is to help with opioid dependency.  I'll put this order in today.    Tia, do you know of any other pain clinics in the area that would accept him?  The issue is that he has no insurance.

## 2015-04-27 NOTE — Telephone Encounter (Signed)
I will send referral to [facility] in [facility], I believe they see patient without insurance.

## 2015-04-28 ENCOUNTER — Encounter (HOSPITAL_COMMUNITY): Payer: Self-pay | Admitting: Emergency Medicine

## 2015-04-28 ENCOUNTER — Emergency Department (HOSPITAL_COMMUNITY)
Admission: EM | Admit: 2015-04-28 | Discharge: 2015-04-28 | Disposition: A | Discharge status: Home / Self Care | Payer: Self-pay | Attending: Emergency Medicine | Admitting: Emergency Medicine

## 2015-04-28 DIAGNOSIS — F419 Anxiety disorder, unspecified: Secondary | ICD-10-CM | POA: Insufficient documentation

## 2015-04-28 DIAGNOSIS — I10 Essential (primary) hypertension: Secondary | ICD-10-CM | POA: Insufficient documentation

## 2015-04-28 DIAGNOSIS — F1123 Opioid dependence with withdrawal: Secondary | ICD-10-CM | POA: Insufficient documentation

## 2015-04-28 DIAGNOSIS — Z720 Tobacco use: Secondary | ICD-10-CM | POA: Insufficient documentation

## 2015-04-28 DIAGNOSIS — F1120 Opioid dependence, uncomplicated: Secondary | ICD-10-CM

## 2015-04-28 DIAGNOSIS — F329 Major depressive disorder, single episode, unspecified: Secondary | ICD-10-CM | POA: Insufficient documentation

## 2015-04-28 DIAGNOSIS — F19239 Other psychoactive substance dependence with withdrawal, unspecified: Secondary | ICD-10-CM

## 2015-04-28 DIAGNOSIS — K219 Gastro-esophageal reflux disease without esophagitis: Secondary | ICD-10-CM | POA: Insufficient documentation

## 2015-04-28 DIAGNOSIS — J449 Chronic obstructive pulmonary disease, unspecified: Secondary | ICD-10-CM | POA: Insufficient documentation

## 2015-04-28 DIAGNOSIS — M1990 Unspecified osteoarthritis, unspecified site: Secondary | ICD-10-CM | POA: Insufficient documentation

## 2015-04-28 DIAGNOSIS — Z79899 Other long term (current) drug therapy: Secondary | ICD-10-CM | POA: Insufficient documentation

## 2015-04-28 DIAGNOSIS — Z8669 Personal history of other diseases of the nervous system and sense organs: Secondary | ICD-10-CM | POA: Insufficient documentation

## 2015-04-28 DIAGNOSIS — Z87828 Personal history of other (healed) physical injury and trauma: Secondary | ICD-10-CM | POA: Insufficient documentation

## 2015-04-28 LAB — COMPREHENSIVE METABOLIC PANEL
ALK PHOS: 74 U/L (ref 38–126)
ALT: 25 U/L (ref 17–63)
AST: 18 U/L (ref 15–41)
Albumin: 3.7 g/dL (ref 3.5–5.0)
Anion gap: 5 (ref 5–15)
BUN: 12 mg/dL (ref 6–20)
CHLORIDE: 102 mmol/L (ref 101–111)
CO2: 28 mmol/L (ref 22–32)
Calcium: 9.2 mg/dL (ref 8.9–10.3)
Creatinine, Ser: 0.88 mg/dL (ref 0.61–1.24)
GFR calc Af Amer: >60 mL/min (ref >60)
GFR calc non Af Amer: >60 mL/min (ref >60)
Glucose, Bld: 100 mg/dL — ABNORMAL HIGH (ref 65–99)
POTASSIUM: 4.1 mmol/L (ref 3.5–5.1)
Sodium: 135 mmol/L (ref 135–145)
Total Bilirubin: 0.6 mg/dL (ref 0.3–1.2)
Total Protein: 6.7 g/dL (ref 6.5–8.1)

## 2015-04-28 LAB — CBC WITH DIFFERENTIAL/PLATELET
Basophils Absolute: 0 10*3/uL (ref 0.0–0.1)
Basophils Relative: 0 % (ref 0–1)
EOS ABS: 0.2 10*3/uL (ref 0.0–0.7)
Eosinophils Relative: 2 % (ref 0–5)
HCT: 44.7 % (ref 39.0–52.0)
HEMOGLOBIN: 15.1 g/dL (ref 13.0–17.0)
Lymphocytes Relative: 35 % (ref 12–46)
Lymphs Abs: 3.9 10*3/uL (ref 0.7–4.0)
MCH: 31 pg (ref 26.0–34.0)
MCHC: 33.8 g/dL (ref 30.0–36.0)
MCV: 91.8 fL (ref 78.0–100.0)
MONO ABS: 0.7 10*3/uL (ref 0.1–1.0)
Monocytes Relative: 6 % (ref 3–12)
Neutro Abs: 6.4 10*3/uL (ref 1.7–7.7)
Neutrophils Relative %: 57 % (ref 43–77)
PLATELETS: 260 10*3/uL (ref 150–400)
RBC: 4.87 MIL/uL (ref 4.22–5.81)
RDW: 12.6 % (ref 11.5–15.5)
WBC: 11.2 10*3/uL — AB (ref 4.0–10.5)

## 2015-04-28 LAB — RAPID URINE DRUG SCREEN, HOSP PERFORMED
AMPHETAMINES: NOT DETECTED
Barbiturates: NOT DETECTED
Benzodiazepines: NOT DETECTED
Cocaine: NOT DETECTED
Opiates: NOT DETECTED
TETRAHYDROCANNABINOL: NOT DETECTED

## 2015-04-28 LAB — ETHANOL: Alcohol, Ethyl (B): <5 mg/dL (ref <5)

## 2015-04-28 MED ORDER — HYDROXYZINE HCL 25 MG PO TABS
25.0000 mg | ORAL_TABLET | Freq: Once | ORAL | Status: AC
  Administered 2015-04-28: 25 mg via ORAL
  Filled 2015-04-28: qty 1

## 2015-04-28 MED ORDER — CYCLOBENZAPRINE HCL 10 MG PO TABS: 10.0000 mg | ORAL_TABLET | Freq: Four times a day (QID) | ORAL | Status: DC

## 2015-04-28 MED ORDER — NAPROXEN 500 MG PO TABS: 500.0000 mg | ORAL_TABLET | Freq: Two times a day (BID) | ORAL | Status: DC

## 2015-04-28 MED ORDER — NAPROXEN 250 MG PO TABS
500.0000 mg | ORAL_TABLET | Freq: Once | ORAL | Status: AC
  Administered 2015-04-28: 500 mg via ORAL
  Filled 2015-04-28: qty 2

## 2015-04-28 MED ORDER — DICYCLOMINE HCL 20 MG PO TABS
20.0000 mg | ORAL_TABLET | Freq: Once | ORAL | Status: AC
  Administered 2015-04-28: 20 mg via ORAL
  Filled 2015-04-28: qty 1

## 2015-04-28 MED ORDER — CLONIDINE HCL 0.2 MG PO TABS
0.2000 mg | ORAL_TABLET | Freq: Once | ORAL | Status: AC
  Administered 2015-04-28: 0.2 mg via ORAL
  Filled 2015-04-28: qty 1

## 2015-04-28 MED ORDER — DICYCLOMINE HCL 20 MG PO TABS: 20.0000 mg | ORAL_TABLET | Freq: Four times a day (QID) | ORAL | Status: DC

## 2015-04-28 MED ORDER — METHOCARBAMOL 500 MG PO TABS
500.0000 mg | ORAL_TABLET | Freq: Once | ORAL | Status: AC
  Administered 2015-04-28: 500 mg via ORAL
  Filled 2015-04-28: qty 1

## 2015-04-28 MED ORDER — HYDROXYZINE HCL 25 MG PO TABS: 25.0000 mg | ORAL_TABLET | Freq: Once | ORAL | Status: DC

## 2015-04-28 MED ORDER — CLONIDINE HCL 0.2 MG PO TABS: ORAL_TABLET | ORAL | Status: DC

## 2015-04-28 NOTE — ED Provider Notes (Signed)
CSN: [id number]     Arrival date & time 04/28/15  0327 History   First MD Initiated Contact with Patient 04/28/15 [phone]     Chief Complaint  Patient presents with  . 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 The patient states that he ran out of his Percocet 5/325 which he has been taking 3 times daily for the past 6 years for chronic knee and back pain. The patient currently complains of agitation, gooseflesh, abdominal cramps, restlessness and mild nausea. He denies any current diarrhea or volume loss. Patient states that he unknowingly broke the contract for pain management when he received pain medications from an orthopedist was evaluating his knee. Review of his chart also shows multiple early refills on his pain medications and ended advisement to not fill them early. The patient denies use of any illicit drugs. He denies suicidal ideation or homicidal ideation.   Past Medical History  Diagnosis Date  . Hypertension   . COPD (chronic obstructive pulmonary disease)   . Bronchitis     hx of  . Asthma   . Concussion 1983  . Headache(784.0)   . Anxiety   . Depression   . Umbilical hernia   . GERD (gastroesophageal reflux disease)     takes tums/rolaids  . Arthritis   . Head trauma   . 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 Knee surgery    . Thumb surgery (left)    . Umbilical hernia repair  05/15/2012    Procedure: HERNIA REPAIR UMBILICAL ADULT;  Surgeon: Trevor Pu. Cornett, MD;  Location: [facility] OR;  Service: General;  Laterality: N/A;  . Hernia repair  05/2012    umbilical hernia  . Eye surgery  20 years ago    Metal foreign body removed from Right eye  . Esophagogastroduodenoscopy N/A  08/25/2014    Procedure: ESOPHAGOGASTRODUODENOSCOPY (EGD);  Surgeon: Trevor Meckel, MD;  Location[facility] ENDOSCOPY;  Service: Endoscopy;  Laterality: N/A;   Family History  Problem Relation Age of Onset  . Diabetes Mother   . Diabetes Father   . Heart failure Brother    History  Substance Use Topics  . Smoking status: Current Every Day Smoker -- 0.75 packs/day for 20 years    Types: Cigarettes  . Smokeless tobacco: Never Used  . Alcohol Use: No     Comment: rarely  - quit approx. June 2013    Review of Systems   Ten systems reviewed and are negative for acute change, except as noted in the HPI.    Allergies  Bee venom  Home Medications   Prior to Admission medications   Medication Sig Start Date End Date Taking? Authorizing Provider  albuterol (PROVENTIL HFA;VENTOLIN HFA) 108 (90 BASE) MCG/ACT inhaler Inhale 2 puffs into the lungs every 6 (six) hours as needed for wheezing. 09/02/14   Trevor Grim, MD  baclofen (LIORESAL) 10 MG tablet Take 1 tablet (10 mg total) by mouth 3 (three) times daily. Patient not taking: Reported on 03/08/2015 09/17/14   Trevor Grim, MD  citalopram (CELEXA) 40 MG tablet Take 1 tablet (40 mg total) by mouth daily. 01/23/15   Trevor Grim, MD  cloNIDine (CATAPRES) 0.2 MG tablet Take 1 tablet (0.2 mg total)  by mouth 3 (three) times daily. 04/14/15   Trevor Grim, MD  ferrous sulfate 325 (65 FE) MG tablet Take 1 tablet (325 mg total) by mouth daily with breakfast. Patient not taking: Reported on 03/08/2015 09/02/14   Trevor Grim, MD  lisinopril-hydrochlorothiazide (PRINZIDE,ZESTORETIC) 20-25 MG per tablet TAKE ONE TABLET BY MOUTH IN THE MORNING 01/14/15   Trevor Grim, MD  loratadine (CLARITIN) 10 MG tablet Take 1 tablet (10 mg total) by mouth every morning. 01/14/15   Trevor Grim, MD  LORazepam (ATIVAN) 0.5 MG tablet Take 1 tablet (0.5 mg total) by mouth at bedtime as needed for anxiety. 10/28/14   Trevor Grim, MD   oxyCODONE-acetaminophen (ROXICET) 5-325 MG per tablet Take 1 tablet by mouth every 8 (eight) hours as needed for severe pain. 03/31/15   Trevor Grim, MD  promethazine (PHENERGAN) 25 MG tablet Take 1 tablet (25 mg total) by mouth every 8 (eight) hours as needed for nausea or vomiting. 04/14/15   Trevor Grim, MD  traMADol (ULTRAM) 50 MG tablet TAKE ONE TABLET BY MOUTH AT BEDTIME AS NEEDED FOR  RESTLESS  LEG  SYNDROME 02/10/15   Trevor Grim, MD   BP 125/90 mmHg  Pulse 79  Temp(Src) 98.1 F (36.7 C) (Oral)  Resp 16  Ht 5\' 11"  (1.803 m)  Wt 260 lb (117.935 kg)  BMI 36.28 kg/m2  SpO2 96% Physical Exam Physical Exam  Nursing note and vitals reviewed. Constitutional: He appears well-developed and well-nourished. No distress. Agitated, holding abdomen, pacing the room. HENT:  Head: Normocephalic and atraumatic.  Eyes: Conjunctivae normal are normal. No scleral icterus.  Neck: Normal range of motion. Neck supple.  Cardiovascular: Normal rate, regular rhythm and normal heart sounds.   Pulmonary/Chest: Effort normal and breath sounds normal. No respiratory distress.  Abdominal: Soft. There is no tenderness.  Musculoskeletal: He exhibits no edema.  Neurological: He is alert.  Skin: Skin is warm and dry. He is not diaphoretic.  Psychiatric: His behavior is agitated but friendly.   ED Course  Procedures (including critical care time) Labs Review Labs Reviewed  COMPREHENSIVE METABOLIC PANEL - Abnormal; Notable for the following:    Glucose, Bld 100 (*)    All other components within normal limits  CBC WITH DIFFERENTIAL/PLATELET - Abnormal; Notable for the following:    WBC 11.2 (*)    All other components within normal limits  ETHANOL  URINE RAPID DRUG SCREEN, HOSP PERFORMED    Imaging Review No results found.   EKG Interpretation None      MDM   Final diagnoses:  Narcotic dependence  Acute narcotic withdrawal, with unspecified complication   BP 140/76 mmHg   Pulse 74  Temp(Src) 98.5 F (36.9 C) (Oral)  Resp 18  Ht 5\' 11"  (1.803 m)  Wt 260 lb (117.935 kg)  BMI 36.28 kg/m2  SpO2 99%  Patient here with complaint of opiate with drawl from his chronic opiate dependence. I discussed the fact that I'm unable to provide a refill on his narcotic medications or help with chronic pain symptoms. However, would place him on the clonidine withdrawal protocol at home to manage symptoms until he is able to see his primary care physician. He has no suicidal ideation or homicidal ideation. Patient will receive outpatient resources for opiate dependence. Given clonidine, hydroxyzine, Bentyl and Flexeril here in the emergency department. Vital signs are stable. Mild leukocytosis, likely secondary to acute phase reaction. He appears safe for discharge at this time.  Arthor Captain, PA-C 04/28/15 8315  Dione Booze, MD 04/28/15 [phone]

## 2015-04-28 NOTE — ED Notes (Signed)
Patient here with complaint of withdrawal from narcotic pain medication. Reports history of chronic pain and use of narcotics. Presents stating "I think I violated my contract"; explains that he had an injury to his knee and was prescribed some pain medication for that injury, after filling the medication he "found out that that would violate his contract". Reports that facility where he normally gets his medications will not return his phone calls. States issue occurred earlier this month and he ran out 2 days ago. Since that time patient feels fatigued with pain all over. Ambulatory without difficulty. Horripilation noted in triage.

## 2015-04-28 NOTE — ED Notes (Addendum)
Pt denies improvement of agitation with clonidine; reports taking clonidine at home. Pt restless,pacing the room and remains agitated

## 2015-04-28 NOTE — ED Notes (Signed)
Pt remains restless sitting in chair in room, wants to leave AMA, spoke to PA, will see patient

## 2015-04-28 NOTE — ED Notes (Signed)
Pt to ED c/o detox from percocet; last dose 04/27/15 evening. Denies SI/HI. Pt reports being on percocet 5mg  for chronic back pain; has been in contract with PCP for narcotic use and feels he has broke his contract. Reports PCP to referred pt to chronic pain clinic. Pt pleasantly anxious on assessment; unable to sit still; COWS 15.

## 2015-04-28 NOTE — ED Notes (Signed)
PA at bedside.

## 2015-04-28 NOTE — Discharge Instructions (Signed)
Opioid Use Disorder °Opioid use disorder is a mental disorder. It is the continued nonmedical use of opioids in spite of risks to health and well-being. Misused opioids include the street drug heroin. They also include pain medicines such as morphine, hydrocodone, oxycodone, and fentanyl. Opioids are very addictive. People who misuse opioids get an exaggerated feeling of well-being. Opioid use disorder often disrupts activities at home, work, or school. It may cause mental or physical problems.  °A family history of opioid use disorder puts you at higher risk of it. People with opioid use disorder often misuse other drugs or have mental illness such as depression, posttraumatic stress disorder, or antisocial personality disorder. They also are at risk of suicide and death from overdose. °SIGNS AND SYMPTOMS  °Signs and symptoms of opioid use disorder include: °· Use of opioids in larger amounts or over a longer period than intended. °· Unsuccessful attempts to cut down or control opioid use. °· A lot of time spent obtaining, using, or recovering from the effects of opioids. °· A strong desire or urge to use opioids (craving). °· Continued use of opioids in spite of major problems at work, school, or home because of use. °· Continued use of opioids in spite of relationship problems because of use. °· Giving up or cutting down on important life activities because of opioid use. °· Use of opioids over and over in situations when it is physically hazardous, such as driving a car. °· Continued use of opioids in spite of a physical problem that is likely related to use. Physical problems can include: °· Severe constipation. °· Poor nutrition. °· Infertility. °· Tuberculosis. °· Aspiration pneumonia. °· Infections such as human immunodeficiency virus (HIV) and hepatitis (from injecting opioids). °· Continued use of opioids in spite of a mental problem that is likely related to use. Mental problems can  include: °· Depression. °· Anxiety. °· Hallucinations. °· Sleep problems. °· Loss of sexual function. °· Need to use more and more opioids to get the same effect, or lessened effect over time with use of the same amount (tolerance). °· Having withdrawal symptoms when opioid use is stopped, or using opioids to reduce or avoid withdrawal symptoms. Withdrawal symptoms include: °· Depressed, anxious, or irritable mood. °· Nausea, vomiting, diarrhea, or intestinal cramping. °· Muscle aches or spasms. °· Excessive tearing or runny nose. °· Dilated pupils, sweating, or hairs standing on end. °· Yawning. °· Fever, raised blood pressure, or fast pulse. °· Restlessness or trouble sleeping. This does not apply to people taking opioids for medical reasons only. °DIAGNOSIS °Opioid use disorder is diagnosed by your health care provider. You may be asked questions about your opioid use and and how it affects your life. A physical exam may be done. A drug screen may be ordered. You may be referred to a mental health professional. The diagnosis of opioid use disorder requires at least two symptoms within 12 months. The type of opioid use disorder you have depends on the number of signs and symptoms you have. The type may be: °· Mild. Two or three signs and symptoms.    °· Moderate. Four or five signs and symptoms.   °· Severe. Six or more signs and symptoms. °TREATMENT  °Treatment is usually provided by mental health professionals with training in substance use disorders. The following options are available: °· Detoxification. This is the first step in treatment for withdrawal. It is medically supervised withdrawal with the use of medicines. These medicines lessen withdrawal symptoms. They also raise the chance   of becoming opioid free. °· Counseling, also known as talk therapy. Talk therapy addresses the reasons you use opioids. It also addresses ways to keep you from using again (relapse). The goals of talk therapy are to avoid  relapse by: °· Identifying and avoiding triggers for use. °· Finding healthy ways to cope with stress. °· Learning how to handle cravings. °· Support groups. Support groups provide emotional support, advice, and guidance. °· A medicine that blocks opioid receptors in your brain. This medicine can reduce opioid cravings that lead to relapse. This medicine also blocks the desired opioid effect when relapse occurs. °· Opioids that are taken by mouth in place of the misused opioid (opioid maintenance treatment). These medicines satisfy cravings but are safer than commonly misused opioids. This often is the best option for people who continue to relapse with other treatments. °HOME CARE INSTRUCTIONS  °· Take medicines only as directed by your health care provider. °· Check with your health care provider before starting new medicines. °· Keep all follow-up visits as directed by your health care provider. °SEEK MEDICAL CARE IF: °· You are not able to take your medicines as directed. °· Your symptoms get worse. °SEEK IMMEDIATE MEDICAL CARE IF: °· You have serious thoughts about hurting yourself or others. °· You may have taken an overdose of opioids. °FOR MORE INFORMATION °· National Institute on Drug Abuse: www.drugabuse.gov °· Substance Abuse and Mental Health Services Administration: www.samhsa.gov °Document Released: 07/17/2007 Document Revised: 02/03/2014 Document Reviewed: 10/02/2013 °ExitCare® Patient Information ©2015 [employer]. This information is not intended to replace advice given to you by your health care provider. Make sure you discuss any questions you have with your health care provider. ° °Opioid Withdrawal °Opioids are a group of narcotic drugs. They include the street drug heroin. They also include pain medicines, such as morphine, hydrocodone, oxycodone, and fentanyl. Opioid withdrawal is a group of characteristic physical and mental signs and symptoms. It typically occurs if you have been using  opioids daily for several weeks or longer and stop using or rapidly decrease use. Opioid withdrawal can also occur if you have used opioids daily for a long time and are given a medicine to block the effect.  °SIGNS AND SYMPTOMS °Opioid withdrawal includes three or more of the following symptoms:  °· Depressed, anxious, or irritable mood. °· Nausea or vomiting. °· Muscle aches or spasms.   °· Watery eyes.    °· Runny nose. °· Dilated pupils, sweating, or hairs standing on end. °· Diarrhea or intestinal cramping. °· Yawning.   °· Fever. °· Increased blood pressure. °· Fast pulse. °· Restlessness or trouble sleeping. °These signs and symptoms occur within several hours of stopping or reducing short-acting opioids, such as heroin. They can occur within 3 days of stopping or reducing long-acting opioids, such as methadone. Withdrawal begins within minutes of receiving a drug that blocks the effects of opioids, such as naltrexone or naloxone. °DIAGNOSIS  °Opioid use disorder is diagnosed by your health care provider. You will be asked about your symptoms, drug and alcohol use, medical history, and use of medicines. A physical exam may be done. Lab tests may be ordered. Your health care provider may have you see a mental health professional.  °TREATMENT  °The treatment for opioid withdrawal is usually provided by medical doctors with special training in substance use disorders (addiction specialists). The following medicines may be included in treatment: °· Opioids given in place of the abused opioid. They turn on opioid receptors in the brain and   lessen or prevent withdrawal symptoms. They are gradually decreased (opioid substitution and taper). °· Non-opioids that can lessen certain opioid withdrawal symptoms. They may be used alone or with opioid substitution and taper. °Successful long-term recovery usually requires medicine, counseling, and group support. °HOME CARE INSTRUCTIONS  °· Take medicines only as directed by  your health care provider. °· Check with your health care provider before starting new medicines. °· Keep all follow-up visits as directed by your health care provider. °SEEK MEDICAL CARE IF: °· You are not able to take your medicines as directed. °· Your symptoms get worse. °· You relapse. °SEEK IMMEDIATE MEDICAL CARE IF: °· You have serious thoughts about hurting yourself or others. °· You have a seizure. °· You lose consciousness. °Document Released: 09/22/2003 Document Revised: 02/03/2014 Document Reviewed: 10/02/2013 °ExitCare® Patient Information ©2015 [employer]. This information is not intended to replace advice given to you by your health care provider. Make sure you discuss any questions you have with your health care provider. ° °

## 2015-04-29 ENCOUNTER — Telehealth: Payer: Self-pay | Admitting: Family Medicine

## 2015-04-29 NOTE — Telephone Encounter (Signed)
Thank you Trevor Thomas.  FYI to Dr. Gwendolyn Grant that Shann Medal will likely be IC consultant that day.  I will put Mr. Ensz in the IC schedule as a reminder to him.  See me with questions.

## 2015-04-29 NOTE — Telephone Encounter (Signed)
Followed up with pt regarding visit on 01/23/15 to see if he was able to get established into [facility]. He expressed that he was unable to do so and that he was interested in touching base with our behavioral health consultants here for IC. I set him up on a Friday 05/22/2015 for such consultation to follow up.   Dr. Pascal Lux -  This is updated in the registry.   Thank you, Dorothey Baseman, ASA

## 2015-04-30 ENCOUNTER — Other Ambulatory Visit: Payer: Self-pay | Admitting: Family Medicine

## 2015-04-30 NOTE — Telephone Encounter (Signed)
Patient requesting this refill.

## 2015-05-01 ENCOUNTER — Telehealth: Payer: Self-pay | Admitting: Family Medicine

## 2015-05-01 NOTE — Telephone Encounter (Signed)
Pt called. Informed him is Lorazepam is being faxed today to [facility] on [address]. Sunday Spillers, CMA

## 2015-05-01 NOTE — Telephone Encounter (Signed)
LVM for pt to call the office. Jenilyn Magana T Analisse Randle, CMA  

## 2015-05-01 NOTE — Telephone Encounter (Signed)
I am covering for Dr. Gwendolyn Grant who is away from the office.  Pt recently had narcotic agreement terminated due to violation of his contract. Spoke with Dr. Gwendolyn Grant who okay'd refill of lorazepam. Rx printed and will fax to pt's pharmacy ([facility] on [address]).  [facility] team, please call pt to let him know this rx will be faxed over today.  Latrelle Dodrill, MD

## 2015-05-08 ENCOUNTER — Telehealth: Payer: Self-pay | Admitting: Family Medicine

## 2015-05-08 DIAGNOSIS — F1110 Opioid abuse, uncomplicated: Secondary | ICD-10-CM

## 2015-05-08 NOTE — Telephone Encounter (Signed)
Please contact Mr. Bonde regarding some info he have from the [facility].  You can reach him at [phone]

## 2015-05-08 NOTE — Telephone Encounter (Signed)
Spoke to pt, he would have to pay $595 out of pocket before being see at [facility]. Pt becoming increasely frustrated Asked Nelva Bush to call patient again and speak to him about other resources.

## 2015-05-08 NOTE — Telephone Encounter (Signed)
Pt calling and states the following: "I have been taken off all my medication, I am sweating, vomiting, and feel awful. I don't think this is right and I need help, I do not know what to do." Please advise. Pt stated that he called yesterday but documentation was not made. Sadie Reynolds, ASA

## 2015-05-08 NOTE — Telephone Encounter (Signed)
CSW called pt back and encouraged him to contact [facility], pt apparently was calling the wrong number. CSW also provided pt with the information for [facility] so that they could pull a list of different agencies that may have special funding. Pt appreciative.  Theresia Bough, MSW, LCSW [phone]

## 2015-05-08 NOTE — Telephone Encounter (Signed)
I have put in a referral for the [facility] for chemical dependency.  I believe the patient can also contact addiction resourcs.  I'll forward to Theresia Bough to reach out to the patient so he can get some help before our next visit.

## 2015-05-08 NOTE — Telephone Encounter (Signed)
Family Medicine After hours phone call  Patient calls the emergency line at 1:15pm stating similar symptoms as previously stated earlier this morning. He has been sweating, feeling awful, etc. He says a friend gave him a "strip of suboxone" that made him feel remarkably better. I informed him taht he is probably experiencing withdrawal symptoms and that if he was really feeling poorly and unable to move he'd need to go to the emergency room. I told him that I could not prescribe anything over the phone, to which he replied that he did not want anything prescribed, that he wants to get off of the medicines. I then informed him he should be finding addiction resources, and that our office could help him with this if he would schedule an appt with our clinic.   Tawni Carnes, MD 05/08/2015, 1:26 PM PGY-3, [facility]

## 2015-05-08 NOTE — Telephone Encounter (Signed)
Pt has an appt on the 19th to address these concerns. He wants off of the pain medications and he states that he understand that what he is experiencing are withdrawal symptoms. He states that it's extremely hard to physically move because of the aches and pains. He states that he has transportation issues sometimes and feels like he is being ignored. He reiterated to me what was said to Dr. Waynetta Sandy about the "strip". He states that he just needs assistance with enduring the symptoms he has, at least to last until his appt or perhaps advice as to how he can feel relief. Thank you, Dorothey Baseman, ASA

## 2015-05-12 ENCOUNTER — Telehealth: Payer: Self-pay | Admitting: Clinical

## 2015-05-12 NOTE — Telephone Encounter (Signed)
CSW received a call from [facility] at the [facility] regarding pts referral sent last week. Pam provided CSW with education regarding their 3 week taper detox program and the 8 weeks intensive outpatient program. CSW was made aware that pt would be responsible for paying $599 for the first visit/responsible for his suboxen  and $100 every week once in the 8 week program. CSW informed Pam that pt would most likely not be able to afford this cost however requested that she contact pt to discuss with him. Pam agreeable.  Theresia Bough, MSW, LCSW [phone]

## 2015-05-20 ENCOUNTER — Encounter (HOSPITAL_COMMUNITY): Payer: Self-pay | Admitting: *Deleted

## 2015-05-20 ENCOUNTER — Emergency Department (HOSPITAL_COMMUNITY)
Admission: EM | Admit: 2015-05-20 | Discharge: 2015-05-21 | Disposition: A | Discharge status: Home / Self Care | Payer: Self-pay | Attending: Emergency Medicine | Admitting: Emergency Medicine

## 2015-05-20 DIAGNOSIS — R110 Nausea: Secondary | ICD-10-CM | POA: Insufficient documentation

## 2015-05-20 DIAGNOSIS — M7989 Other specified soft tissue disorders: Secondary | ICD-10-CM

## 2015-05-20 DIAGNOSIS — F419 Anxiety disorder, unspecified: Secondary | ICD-10-CM | POA: Insufficient documentation

## 2015-05-20 DIAGNOSIS — F329 Major depressive disorder, single episode, unspecified: Secondary | ICD-10-CM | POA: Insufficient documentation

## 2015-05-20 DIAGNOSIS — M1990 Unspecified osteoarthritis, unspecified site: Secondary | ICD-10-CM | POA: Insufficient documentation

## 2015-05-20 DIAGNOSIS — Z8719 Personal history of other diseases of the digestive system: Secondary | ICD-10-CM | POA: Insufficient documentation

## 2015-05-20 DIAGNOSIS — Z87828 Personal history of other (healed) physical injury and trauma: Secondary | ICD-10-CM | POA: Insufficient documentation

## 2015-05-20 DIAGNOSIS — M7012 Bursitis, left hand: Secondary | ICD-10-CM | POA: Insufficient documentation

## 2015-05-20 DIAGNOSIS — J449 Chronic obstructive pulmonary disease, unspecified: Secondary | ICD-10-CM | POA: Insufficient documentation

## 2015-05-20 DIAGNOSIS — Z720 Tobacco use: Secondary | ICD-10-CM | POA: Insufficient documentation

## 2015-05-20 DIAGNOSIS — M719 Bursopathy, unspecified: Secondary | ICD-10-CM

## 2015-05-20 DIAGNOSIS — Y9389 Activity, other specified: Secondary | ICD-10-CM | POA: Insufficient documentation

## 2015-05-20 DIAGNOSIS — Z8669 Personal history of other diseases of the nervous system and sense organs: Secondary | ICD-10-CM | POA: Insufficient documentation

## 2015-05-20 DIAGNOSIS — Z79899 Other long term (current) drug therapy: Secondary | ICD-10-CM | POA: Insufficient documentation

## 2015-05-20 DIAGNOSIS — I10 Essential (primary) hypertension: Secondary | ICD-10-CM | POA: Insufficient documentation

## 2015-05-20 DIAGNOSIS — R6883 Chills (without fever): Secondary | ICD-10-CM | POA: Insufficient documentation

## 2015-05-20 LAB — COMPREHENSIVE METABOLIC PANEL
ALT: 22 U/L (ref 17–63)
ANION GAP: 9 (ref 5–15)
AST: 22 U/L (ref 15–41)
Albumin: 3.8 g/dL (ref 3.5–5.0)
Alkaline Phosphatase: 93 U/L (ref 38–126)
BUN: 11 mg/dL (ref 6–20)
CHLORIDE: 100 mmol/L — AB (ref 101–111)
CO2: 27 mmol/L (ref 22–32)
CREATININE: 0.79 mg/dL (ref 0.61–1.24)
Calcium: 9.1 mg/dL (ref 8.9–10.3)
GFR calc non Af Amer: >60 mL/min (ref >60)
Glucose, Bld: 132 mg/dL — ABNORMAL HIGH (ref 65–99)
POTASSIUM: 3.2 mmol/L — AB (ref 3.5–5.1)
SODIUM: 136 mmol/L (ref 135–145)
Total Bilirubin: 0.3 mg/dL (ref 0.3–1.2)
Total Protein: 7 g/dL (ref 6.5–8.1)

## 2015-05-20 LAB — CBG MONITORING, ED: GLUCOSE-CAPILLARY: 138 mg/dL — AB (ref 65–99)

## 2015-05-20 LAB — RAPID URINE DRUG SCREEN, HOSP PERFORMED
Amphetamines: NOT DETECTED
BENZODIAZEPINES: NOT DETECTED
Barbiturates: NOT DETECTED
COCAINE: NOT DETECTED
Opiates: POSITIVE — AB
Tetrahydrocannabinol: POSITIVE — AB

## 2015-05-20 LAB — CBC
HCT: 42 % (ref 39.0–52.0)
Hemoglobin: 14.1 g/dL (ref 13.0–17.0)
MCH: 30.8 pg (ref 26.0–34.0)
MCHC: 33.6 g/dL (ref 30.0–36.0)
MCV: 91.7 fL (ref 78.0–100.0)
Platelets: 251 10*3/uL (ref 150–400)
RBC: 4.58 MIL/uL (ref 4.22–5.81)
RDW: 12.6 % (ref 11.5–15.5)
WBC: 8.6 10*3/uL (ref 4.0–10.5)

## 2015-05-20 LAB — ETHANOL: Alcohol, Ethyl (B): <5 mg/dL (ref <5)

## 2015-05-20 LAB — SALICYLATE LEVEL

## 2015-05-20 LAB — ACETAMINOPHEN LEVEL

## 2015-05-20 NOTE — ED Notes (Signed)
The pt is here for several reasons.  He has had pain and redness in his lt hand and it is painful.  He is also c/o some sob with  Some audible wheezes.  He reports that  He is having a problem with his nerves and he wants to talk to the doctor about it.  No resp difficulty at present.  He smokes

## 2015-05-20 NOTE — ED Notes (Addendum)
Pt reports increased "antsiness" and increasing pain; was explained to pt that he was waiting on MD to assess him; Pt verbalized understanding; ice pack applied to LEFT hand; VSS

## 2015-05-21 ENCOUNTER — Emergency Department (HOSPITAL_COMMUNITY): Payer: Self-pay

## 2015-05-21 ENCOUNTER — Encounter (HOSPITAL_COMMUNITY): Payer: Self-pay | Admitting: Radiology

## 2015-05-21 LAB — C-REACTIVE PROTEIN: CRP: 1.7 mg/dL — ABNORMAL HIGH

## 2015-05-21 MED ORDER — IBUPROFEN 400 MG PO TABS
600.0000 mg | ORAL_TABLET | Freq: Once | ORAL | Status: AC
  Administered 2015-05-21: 600 mg via ORAL
  Filled 2015-05-21 (×2): qty 1

## 2015-05-21 MED ORDER — SODIUM CHLORIDE 0.9 % IV BOLUS (SEPSIS)
1000.0000 mL | Freq: Once | INTRAVENOUS | Status: AC
  Administered 2015-05-21: 1000 mL via INTRAVENOUS

## 2015-05-21 MED ORDER — NAPROXEN 500 MG PO TABS: 500.0000 mg | ORAL_TABLET | Freq: Two times a day (BID) | ORAL | Status: DC

## 2015-05-21 MED ORDER — IOHEXOL 300 MG/ML  SOLN: 100.0000 mL | Freq: Once | INTRAMUSCULAR | Status: DC | PRN

## 2015-05-21 MED ORDER — OXYCODONE-ACETAMINOPHEN 5-325 MG PO TABS
1.0000 | ORAL_TABLET | Freq: Once | ORAL | Status: AC
  Administered 2015-05-21: 1 via ORAL
  Filled 2015-05-21: qty 1

## 2015-05-21 MED ORDER — LORAZEPAM 2 MG/ML IJ SOLN
1.0000 mg | Freq: Once | INTRAMUSCULAR | Status: AC
  Administered 2015-05-21: 1 mg via INTRAVENOUS
  Filled 2015-05-21: qty 1

## 2015-05-21 MED ORDER — OXYCODONE-ACETAMINOPHEN 5-325 MG PO TABS: 1.0000 | ORAL_TABLET | ORAL | Status: DC | PRN

## 2015-05-21 MED ORDER — DOXYCYCLINE HYCLATE 100 MG PO TABS
100.0000 mg | ORAL_TABLET | Freq: Once | ORAL | Status: AC
  Administered 2015-05-21: 100 mg via ORAL
  Filled 2015-05-21: qty 1

## 2015-05-21 MED ORDER — IOHEXOL 300 MG/ML  SOLN
80.0000 mL | Freq: Once | INTRAMUSCULAR | Status: AC | PRN
  Administered 2015-05-21: 80 mL via INTRAVENOUS

## 2015-05-21 MED ORDER — FENTANYL CITRATE (PF) 100 MCG/2ML IJ SOLN
50.0000 ug | Freq: Once | INTRAMUSCULAR | Status: AC
  Administered 2015-05-21: 50 ug via INTRAVENOUS
  Filled 2015-05-21: qty 2

## 2015-05-21 MED ORDER — CEPHALEXIN 250 MG PO CAPS
500.0000 mg | ORAL_CAPSULE | Freq: Once | ORAL | Status: AC
  Administered 2015-05-21: 500 mg via ORAL
  Filled 2015-05-21: qty 2

## 2015-05-21 MED ORDER — CEPHALEXIN 500 MG PO CAPS: 500.0000 mg | ORAL_CAPSULE | Freq: Four times a day (QID) | ORAL | Status: DC

## 2015-05-21 MED ORDER — DOXYCYCLINE HYCLATE 100 MG PO CAPS: 100.0000 mg | ORAL_CAPSULE | Freq: Two times a day (BID) | ORAL | Status: DC

## 2015-05-21 NOTE — ED Notes (Signed)
Gave pt Coke cola, per [employer]

## 2015-05-21 NOTE — ED Provider Notes (Signed)
CSN: [id number]     Arrival date & time 05/20/15  2023 History  This chart was scribed for Trevor Kaplan, MD by [name], ED Scribe. This patient was seen in room B17C/B17C and the patient's care was started at 12:33 AM.    Chief Complaint  Patient presents with  . multiple complaints       The history is provided by the patient. No language interpreter was used.   HPI Comments: Trevor Thomas is a 49 y.o. male who has hx of COPD, asthma and HTN presents to the Emergency Department complaining of multiple complaints. Pt reports having pain, numbness and swelling in his left hand onset 3 days ago. Pt denies injury to his hand prior to symptoms occurring. Pt notes taking ibuprofen and ice to help alleviate the pain with no relief. Per wife, pt has also had increased anxiety and depression recently. Pt was on percocet for about 6-7 years until he recently ran out and is currently having withdrawal symptoms of restlessness, anxiety, chills and nausea. Pt is a smoker.   Past Medical History  Diagnosis Date  . Hypertension   . COPD (chronic obstructive pulmonary disease)   . Bronchitis     hx of  . Asthma   . Concussion 1983  . Headache(784.0)   . Anxiety   . Depression   . Umbilical hernia   . GERD (gastroesophageal reflux disease)     takes tums/rolaids  . Arthritis   . Head trauma   . 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 Knee surgery    . Thumb surgery (left)    . Umbilical hernia repair  05/15/2012    Procedure: HERNIA REPAIR UMBILICAL ADULT;  Surgeon: Clovis Pu. Cornett, MD;  Location: [facility] OR;  Service: General;  Laterality: N/A;  . Hernia repair  05/2012    umbilical hernia  . Eye surgery  20 years ago    Metal foreign body removed from Right eye  . Esophagogastroduodenoscopy N/A 08/25/2014    Procedure: ESOPHAGOGASTRODUODENOSCOPY (EGD);  Surgeon: Louis Meckel, MD;  Location[facility]  ENDOSCOPY;  Service: Endoscopy;  Laterality: N/A;   Family History  Problem Relation Age of Onset  . Diabetes Mother   . Diabetes Father   . Heart failure Brother    Social History  Substance Use Topics  . Smoking status: Current Every Day Smoker -- 0.75 packs/day for 20 years    Types: Cigarettes  . Smokeless tobacco: Never Used  . Alcohol Use: No     Comment: rarely  - quit approx. June 2013    Review of Systems  A complete 10 system review of systems was obtained and all systems are negative except as noted in the HPI and PMH.    Allergies  Bee venom  Home Medications   Prior to Admission medications   Medication Sig Start Date End Date Taking? Authorizing Provider  albuterol (PROVENTIL HFA;VENTOLIN HFA) 108 (90 BASE) MCG/ACT inhaler Inhale 2 puffs into the lungs every 6 (six) hours as needed for wheezing. 09/02/14  Yes Tobey Grim, MD  citalopram (CELEXA) 40 MG tablet Take 1 tablet (40 mg total) by mouth daily. 01/23/15  Yes Tobey Grim, MD  cloNIDine (CATAPRES) 0.2 MG tablet Take 0.1 mg 4 times a day for 2 days Take 0.1 mg 2 times a day for 2 days Take 0.1 mg daily for  2 days 04/28/15  Yes Arthor Captain, PA-C  dicyclomine (BENTYL) 20 MG tablet Take 1 tablet (20 mg total) by mouth every 6 (six) hours. 04/28/15  Yes Arthor Captain, PA-C  lisinopril-hydrochlorothiazide (PRINZIDE,ZESTORETIC) 20-25 MG per tablet TAKE ONE TABLET BY MOUTH IN THE MORNING 01/14/15  Yes Tobey Grim, MD  loratadine (CLARITIN) 10 MG tablet Take 1 tablet (10 mg total) by mouth every morning. 01/14/15  Yes Tobey Grim, MD  LORazepam (ATIVAN) 0.5 MG tablet Take 1 tablet (0.5 mg total) by mouth at bedtime as needed for anxiety or sleep. 05/01/15  Yes Latrelle Dodrill, MD  cephALEXin (KEFLEX) 500 MG capsule Take 1 capsule (500 mg total) by mouth 4 (four) times daily. 05/21/15   Trevor Kaplan, MD  doxycycline (VIBRAMYCIN) 100 MG capsule Take 1 capsule (100 mg total) by mouth 2 (two) times  daily. 05/21/15   Trevor Kaplan, MD  ferrous sulfate 325 (65 FE) MG tablet Take 1 tablet (325 mg total) by mouth daily with breakfast. Patient not taking: Reported on 03/08/2015 09/02/14   Tobey Grim, MD  naproxen (NAPROSYN) 500 MG tablet Take 1 tablet (500 mg total) by mouth 2 (two) times daily. 05/21/15   Trevor Kaplan, MD  oxyCODONE-acetaminophen (PERCOCET/ROXICET) 5-325 MG per tablet Take 1 tablet by mouth every 4 (four) hours as needed for severe pain. 05/21/15   Trevor Kaplan, MD   Triage vitals: BP 128/93 mmHg  Pulse 87  Temp(Src) 98.9 F (37.2 C) (Oral)  Resp 15  SpO2 95% Physical Exam  Constitutional: He is oriented to person, place, and time. He appears well-developed and well-nourished.  HENT:  Head: Normocephalic and atraumatic.  Eyes: EOM are normal.  Neck: Normal range of motion.  Cardiovascular: Normal rate and regular rhythm.   Pulmonary/Chest: Effort normal and breath sounds normal. No respiratory distress.  Lungs are clear to ausculation bilaterally  Abdominal: Soft. He exhibits no distension. There is no tenderness.  Musculoskeletal: Normal range of motion.  Left axillary regions has no lymphadenopathy, no erythema, swelling, enderness to palpation  Left hand is demetus with mild erythema, no calor, swelling, no focal deformity, edema is worse on MCP on 2nd and 3rd digit, intrinsic and extrinsic muscles of hand are firing although ROM is compromised, tenderness with passive ROM of 2nd and 3rd digit  Neurological: He is alert and oriented to person, place, and time.  2+ radial pulse bilaterally  Skin: Skin is warm and dry.  Psychiatric: He has a normal mood and affect. Judgment normal.  Nursing note and vitals reviewed.   ED Course  Procedures  DIAGNOSTIC STUDIES: Oxygen Saturation is 95% on RA, normal by my interpretation.  COORDINATION OF CARE:  12:43 AM Discussed treatment plan which includes CT of hand  with pt at bedside and pt agreed to plan.  Labs  Review Labs Reviewed  COMPREHENSIVE METABOLIC PANEL - Abnormal; Notable for the following:    Potassium 3.2 (*)    Chloride 100 (*)    Glucose, Bld 132 (*)    All other components within normal limits  ACETAMINOPHEN LEVEL - Abnormal; Notable for the following:    Acetaminophen (Tylenol), Serum <10 (*)    All other components within normal limits  URINE RAPID DRUG SCREEN, HOSP PERFORMED - Abnormal; Notable for the following:    Opiates POSITIVE (*)    Tetrahydrocannabinol POSITIVE (*)    All other components within normal limits  C-REACTIVE PROTEIN - Abnormal; Notable for the following:    CRP 1.7 (*)  All other components within normal limits  CBG MONITORING, ED - Abnormal; Notable for the following:    Glucose-Capillary 138 (*)    All other components within normal limits  ETHANOL  SALICYLATE LEVEL  CBC       EKG Interpretation   Date/Time:  Wednesday May 20 2015 20:56:54 EDT Ventricular Rate:  87 PR Interval:  170 QRS Duration: 100 QT Interval:  376 QTC Calculation: 452 R Axis:   58 Text Interpretation:  Normal sinus rhythm Normal ECG ED PHYSICIAN  INTERPRETATION AVAILABLE IN [facility] Confirmed by TEST, Record  ([id number]) on 05/21/2015 7:02:55 AM      MDM   Final diagnoses:  Hand swelling  Bursitis    I personally performed the services described in this documentation, which was scribed in my presence. The recorded information has been reviewed and is accurate.  Pt with hand swelling with mild redness. No warmth to touch. ROM is slightly compromised. Labs are reassuring. Xrays orded - swelling seen. CT soft tissue ordered - ? Bursitis.  No clinical concerns for compartment syndrome. There is no tenderness with passive rom and the hand on palpation is not tight.  CT shows bursitis, splint placed, RICE advised, f/u with hand given if he is not improving and strict ER return precautions for increased pain, increased redness, fevers, fevers.    Trevor Kaplan, MD 05/22/15 1330

## 2015-05-21 NOTE — ED Notes (Addendum)
Md at bedside

## 2015-05-21 NOTE — Discharge Instructions (Signed)
The CT results shows underlyinh bursitis - and this could be inflammatory or infectious. We suspect that it is no infectious - but to be on the safe side, we are prescribing you antibiotics to take. Return to the Er if the symptoms get worse. Otherwise, see the hand doctor as requested - if not better in a week.   Bursitis Bursitis is a swelling and soreness (inflammation) of a fluid-filled sac (bursa) that overlies and protects a joint. It can be caused by injury, overuse of the joint, arthritis or infection. The joints most likely to be affected are the elbows, shoulders, hips and knees. HOME CARE INSTRUCTIONS   Apply ice to the affected area for 15-20 minutes each hour while awake for 2 days. Put the ice in a plastic bag and place a towel between the bag of ice and your skin.  Rest the injured joint as much as possible, but continue to put the joint through a full range of motion, 4 times per day. (The shoulder joint especially becomes rapidly "frozen" if not used.) When the pain lessens, begin normal slow movements and usual activities.  Only take over-the-counter or prescription medicines for pain, discomfort or fever as directed by your caregiver.  Your caregiver may recommend draining the bursa and injecting medicine into the bursa. This may help the healing process.  Follow all instructions for follow-up with your caregiver. This includes any orthopedic referrals, physical therapy and rehabilitation. Any delay in obtaining necessary care could result in a delay or failure of the bursitis to heal and chronic pain. SEEK IMMEDIATE MEDICAL CARE IF:   Your pain increases even during treatment.  You develop an oral temperature above 102 F (38.9 C) and have heat and inflammation over the involved bursa. MAKE SURE YOU:   Understand these instructions.  Will watch your condition.  Will get help right away if you are not doing well or get worse. Document Released: 09/16/2000 Document  Revised: 12/12/2011 Document Reviewed: 12/09/2013 [facility] Patient Information 2015 [place], [place]. This information is not intended to replace advice given to you by your health care provider. Make sure you discuss any questions you have with your health care provider. Elastic Bandage and RICE Elastic bandages come in different shapes and sizes. They perform different functions. Your caregiver will help you to decide what is best for your protection, recovery, or rehabilitation following an injury. The following are some general tips to help you use an elastic bandage.  Use the bandage as directed by the maker of the bandage you are using.  Do not wrap it too tight. This may cut off the circulation of the arm or leg below the bandage.  If part of your body beyond the bandage becomes blue, numb, or swollen, it is too tight. Loosen the bandage as needed to prevent these problems.  See your caregiver or trainer if the bandage seems to be making your problems worse rather than better. Bandages may be a reminder to you that you have an injury. However, they provide very little support. The few pounds of support they provide are minor considering the pressure it takes to injure a joint or tear ligaments. Therefore, the joint will not be able to handle all of the wear and tear it could before the injury. The routine care of many injuries includes Rest, Ice, Compression, and Elevation (RICE).  Rest is required to allow your body to heal. Generally, routine activities can be resumed when comfortable. Injured tendons and bones take  about 6 weeks to heal.  Icing the injury helps keep the swelling down and reduces pain. Do not apply ice directly to the skin. Put ice in a plastic bag. Place a towel between the skin and the bag. This will prevent frostbite to the skin. Apply ice bags to the injured area for 15-20 minutes, every 2 hours while awake. Do this for the first 24 to 48 hours, then as directed by your  caregiver.  Compression helps keep swelling down, gives support, and helps with discomfort. If an elastic bandage has been applied today, it should be removed and reapplied every 3 to 4 hours. It should not be applied tightly, but firmly enough to keep swelling down. Watch fingers or toes for swelling, bluish discoloration, coldness, numbness, or increased pain. If any of these problems occur, remove the bandage and reapply it more loosely. If these problems persist, contact your caregiver.  Elevation helps reduce swelling and decreases pain. The injured area (arms, hands, legs, or feet) should be placed near to or above the heart (center of the chest) if able. Persistent pain and inability to use the injured area for more than 2 to 3 days are warning signs. You should see a caregiver for a follow-up visit as soon as possible. Initially, a minor broken bone (hairline fracture) may not be seen on X-rays. It may take 7 to 10 days to finally show up. Continued pain and swelling show that further evaluation and/or X-rays are needed. Make a follow-up visit with your caregiver. A specialist in reading X-rays (radiologist) will read your X-rays again. Finding out the results of your test Not all test results are available during your visit. If your test results are not back during the visit, make an appointment with your caregiver to find out the results. Do not assume everything is normal if you have not heard from your caregiver or the medical facility. It is important for you to follow up on all of your test results. Document Released: 03/11/2002 Document Revised: 12/12/2011 Document Reviewed: 01/21/2008 [facility] Patient Information 2015 [place], [place]. This information is not intended to replace advice given to you by your health care provider. Make sure you discuss any questions you have with your health care provider.

## 2015-05-21 NOTE — ED Notes (Signed)
Patient transported to X-ray with Salem Caster, transporter

## 2015-05-21 NOTE — ED Notes (Signed)
Patient transported to CT 

## 2015-05-22 ENCOUNTER — Encounter: Payer: Self-pay | Admitting: Family Medicine

## 2015-05-22 ENCOUNTER — Ambulatory Visit (INDEPENDENT_AMBULATORY_CARE_PROVIDER_SITE_OTHER): Payer: Self-pay | Admitting: Family Medicine

## 2015-05-22 ENCOUNTER — Encounter (HOSPITAL_COMMUNITY): Payer: Self-pay | Admitting: General Practice

## 2015-05-22 ENCOUNTER — Inpatient Hospital Stay (HOSPITAL_COMMUNITY): Payer: Self-pay

## 2015-05-22 ENCOUNTER — Ambulatory Visit: Payer: Self-pay

## 2015-05-22 ENCOUNTER — Telehealth: Payer: Self-pay | Admitting: Family Medicine

## 2015-05-22 ENCOUNTER — Inpatient Hospital Stay (HOSPITAL_COMMUNITY)
Admission: AD | Admit: 2015-05-22 | Discharge: 2015-05-23 | DRG: 603 | Disposition: A | Discharge status: Home / Self Care | Payer: Self-pay | Source: Ambulatory Visit | Attending: Family Medicine | Admitting: Family Medicine

## 2015-05-22 VITALS — BP 142/100 | HR 83 | Temp 98.1°F | Ht 71.0 in | Wt 269.5 lb

## 2015-05-22 DIAGNOSIS — F1120 Opioid dependence, uncomplicated: Secondary | ICD-10-CM

## 2015-05-22 DIAGNOSIS — R29898 Other symptoms and signs involving the musculoskeletal system: Secondary | ICD-10-CM | POA: Diagnosis present

## 2015-05-22 DIAGNOSIS — I10 Essential (primary) hypertension: Secondary | ICD-10-CM | POA: Diagnosis present

## 2015-05-22 DIAGNOSIS — Z79891 Long term (current) use of opiate analgesic: Secondary | ICD-10-CM

## 2015-05-22 DIAGNOSIS — M6289 Other specified disorders of muscle: Secondary | ICD-10-CM

## 2015-05-22 DIAGNOSIS — M7989 Other specified soft tissue disorders: Secondary | ICD-10-CM | POA: Insufficient documentation

## 2015-05-22 DIAGNOSIS — F1920 Other psychoactive substance dependence, uncomplicated: Secondary | ICD-10-CM

## 2015-05-22 DIAGNOSIS — K219 Gastro-esophageal reflux disease without esophagitis: Secondary | ICD-10-CM | POA: Diagnosis present

## 2015-05-22 DIAGNOSIS — M129 Arthropathy, unspecified: Secondary | ICD-10-CM | POA: Diagnosis present

## 2015-05-22 DIAGNOSIS — G8929 Other chronic pain: Secondary | ICD-10-CM | POA: Diagnosis present

## 2015-05-22 DIAGNOSIS — Z791 Long term (current) use of non-steroidal anti-inflammatories (NSAID): Secondary | ICD-10-CM

## 2015-05-22 DIAGNOSIS — J45909 Unspecified asthma, uncomplicated: Secondary | ICD-10-CM | POA: Diagnosis present

## 2015-05-22 DIAGNOSIS — L03114 Cellulitis of left upper limb: Principal | ICD-10-CM | POA: Diagnosis present

## 2015-05-22 DIAGNOSIS — F418 Other specified anxiety disorders: Secondary | ICD-10-CM | POA: Diagnosis present

## 2015-05-22 DIAGNOSIS — F17200 Nicotine dependence, unspecified, uncomplicated: Secondary | ICD-10-CM | POA: Diagnosis present

## 2015-05-22 DIAGNOSIS — M79642 Pain in left hand: Secondary | ICD-10-CM | POA: Insufficient documentation

## 2015-05-22 DIAGNOSIS — Z79899 Other long term (current) drug therapy: Secondary | ICD-10-CM

## 2015-05-22 DIAGNOSIS — J449 Chronic obstructive pulmonary disease, unspecified: Secondary | ICD-10-CM | POA: Diagnosis present

## 2015-05-22 DIAGNOSIS — G2581 Restless legs syndrome: Secondary | ICD-10-CM | POA: Diagnosis present

## 2015-05-22 DIAGNOSIS — F329 Major depressive disorder, single episode, unspecified: Secondary | ICD-10-CM | POA: Diagnosis present

## 2015-05-22 DIAGNOSIS — Z720 Tobacco use: Secondary | ICD-10-CM

## 2015-05-22 DIAGNOSIS — Z7951 Long term (current) use of inhaled steroids: Secondary | ICD-10-CM

## 2015-05-22 HISTORY — DX: Other symptoms and signs involving the musculoskeletal system: R29.898

## 2015-05-22 HISTORY — DX: Other specified soft tissue disorders: M79.89

## 2015-05-22 LAB — CBC
HEMATOCRIT: 43.4 % (ref 39.0–52.0)
HEMOGLOBIN: 14.3 g/dL (ref 13.0–17.0)
MCH: 30.4 pg (ref 26.0–34.0)
MCHC: 32.9 g/dL (ref 30.0–36.0)
MCV: 92.1 fL (ref 78.0–100.0)
Platelets: 247 10*3/uL (ref 150–400)
RBC: 4.71 MIL/uL (ref 4.22–5.81)
RDW: 12.6 % (ref 11.5–15.5)
WBC: 6.5 10*3/uL (ref 4.0–10.5)

## 2015-05-22 LAB — BASIC METABOLIC PANEL
ANION GAP: 7 (ref 5–15)
BUN: 13 mg/dL (ref 6–20)
CALCIUM: 8.9 mg/dL (ref 8.9–10.3)
CO2: 29 mmol/L (ref 22–32)
CREATININE: 0.73 mg/dL (ref 0.61–1.24)
Chloride: 102 mmol/L (ref 101–111)
GFR calc Af Amer: >60 mL/min (ref >60)
GLUCOSE: 89 mg/dL (ref 65–99)
Potassium: 4 mmol/L (ref 3.5–5.1)
Sodium: 138 mmol/L (ref 135–145)

## 2015-05-22 LAB — URIC ACID: Uric Acid, Serum: 5.8 mg/dL (ref 4.4–7.6)

## 2015-05-22 MED ORDER — CLONIDINE HCL 0.1 MG PO TABS: 0.1000 mg | ORAL_TABLET | Freq: Every day | ORAL | Status: DC

## 2015-05-22 MED ORDER — SODIUM CHLORIDE 0.9 % IV SOLN
250.0000 mL | INTRAVENOUS | Status: DC | PRN
  Administered 2015-05-22: 250 mL via INTRAVENOUS

## 2015-05-22 MED ORDER — IBUPROFEN 400 MG PO TABS: 800.0000 mg | ORAL_TABLET | Freq: Four times a day (QID) | ORAL | Status: DC | PRN

## 2015-05-22 MED ORDER — HYDROCHLOROTHIAZIDE 25 MG PO TABS
25.0000 mg | ORAL_TABLET | Freq: Every day | ORAL | Status: DC
  Administered 2015-05-23: 25 mg via ORAL
  Filled 2015-05-22: qty 1

## 2015-05-22 MED ORDER — HEPARIN SODIUM (PORCINE) 5000 UNIT/ML IJ SOLN
5000.0000 [IU] | Freq: Three times a day (TID) | INTRAMUSCULAR | Status: DC
  Administered 2015-05-22 – 2015-05-23 (×3): 5000 [IU] via SUBCUTANEOUS
  Filled 2015-05-22 (×4): qty 1

## 2015-05-22 MED ORDER — LORATADINE 10 MG PO TABS
10.0000 mg | ORAL_TABLET | Freq: Every morning | ORAL | Status: DC
  Administered 2015-05-23: 10 mg via ORAL
  Filled 2015-05-22: qty 1

## 2015-05-22 MED ORDER — MORPHINE SULFATE (PF) 2 MG/ML IV SOLN
1.0000 mg | INTRAVENOUS | Status: DC | PRN
  Filled 2015-05-22: qty 1

## 2015-05-22 MED ORDER — LISINOPRIL 20 MG PO TABS
20.0000 mg | ORAL_TABLET | Freq: Every day | ORAL | Status: DC
  Administered 2015-05-23: 20 mg via ORAL
  Filled 2015-05-22: qty 1

## 2015-05-22 MED ORDER — LORAZEPAM 0.5 MG PO TABS
0.5000 mg | ORAL_TABLET | Freq: Every evening | ORAL | Status: DC | PRN
  Filled 2015-05-22: qty 1

## 2015-05-22 MED ORDER — GADOBENATE DIMEGLUMINE 529 MG/ML IV SOLN: 20.0000 mL | Freq: Once | INTRAVENOUS | Status: DC | PRN

## 2015-05-22 MED ORDER — OXYCODONE HCL 5 MG PO TABS
5.0000 mg | ORAL_TABLET | ORAL | Status: DC | PRN
  Administered 2015-05-22 – 2015-05-23 (×5): 5 mg via ORAL
  Filled 2015-05-22 (×5): qty 1

## 2015-05-22 MED ORDER — OXYCODONE-ACETAMINOPHEN 5-325 MG PO TABS: 1.0000 | ORAL_TABLET | ORAL | Status: DC | PRN

## 2015-05-22 MED ORDER — LISINOPRIL-HYDROCHLOROTHIAZIDE 20-25 MG PO TABS: 1.0000 | ORAL_TABLET | Freq: Every morning | ORAL | Status: DC

## 2015-05-22 MED ORDER — SODIUM CHLORIDE 0.9 % IJ SOLN
3.0000 mL | Freq: Two times a day (BID) | INTRAMUSCULAR | Status: DC
  Administered 2015-05-22 – 2015-05-23 (×2): 3 mL via INTRAVENOUS

## 2015-05-22 MED ORDER — CITALOPRAM HYDROBROMIDE 40 MG PO TABS
40.0000 mg | ORAL_TABLET | Freq: Every day | ORAL | Status: DC
  Administered 2015-05-23: 40 mg via ORAL
  Filled 2015-05-22: qty 1

## 2015-05-22 MED ORDER — SODIUM CHLORIDE 0.9 % IJ SOLN: 3.0000 mL | INTRAMUSCULAR | Status: DC | PRN

## 2015-05-22 NOTE — Assessment & Plan Note (Signed)
This is the worst that I have seen Trevor Thomas He is very disheveled and obviously uncomfortable. He is not allowing himself to fully withdrawal as he continues to take intermittent opiates He is prolonging the worst symptoms Social worker consult while inpatient for methadone or Suboxone resources.

## 2015-05-22 NOTE — Telephone Encounter (Signed)
Orders placed per Dr. Jimmey Ralph. Lamonte Sakai, Tinika Bucknam D, [name]

## 2015-05-22 NOTE — Consult Note (Signed)
Reason for Consult:left hand pain and swelling Referring Physician: neal  Trevor Thomas is an 49 y.o. male.  HPI: with h/o 3 to 4 days of left hand pain and swelling  Past Medical History  Diagnosis Date  . Hypertension   . COPD (chronic obstructive pulmonary disease)   . Bronchitis     hx of  . Asthma   . Concussion 1983  . Headache(784.0)   . Anxiety   . Depression   . Umbilical hernia   . GERD (gastroesophageal reflux disease)     takes tums/rolaids  . Arthritis   . Head trauma   . 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  Secondary to eye surgery    Past Surgical History  Procedure Laterality Date  . Knee surgery    . Thumb surgery (left)    . Umbilical hernia repair  05/15/2012    Procedure: HERNIA REPAIR UMBILICAL ADULT;  Surgeon: Joyice Faster. Cornett, MD;  Location: [facility];  Service: General;  Laterality: N/A;  . Hernia repair  05/1828    umbilical hernia  . Eye surgery  20 years ago    Metal foreign body removed from Right eye  . Esophagogastroduodenoscopy N/A 08/25/2014    Procedure: ESOPHAGOGASTRODUODENOSCOPY (EGD);  Surgeon: Inda Castle, MD;  Location: [facility];  Service: Endoscopy;  Laterality: N/A;    Family History  Problem Relation Age of Onset  . Diabetes Mother   . Diabetes Father   . Heart failure Brother     Social History:  reports that he has been smoking Cigarettes.  He has a 15 pack-year smoking history. He has never used smokeless tobacco. He reports that he uses illicit drugs (Marijuana). He reports that he does not drink alcohol.  Allergies:  Allergies  Allergen Reactions  . Bee Venom Anaphylaxis    Medications:  Scheduled: . citalopram  40 mg Oral Daily  . heparin  5,000 Units Subcutaneous 3 times per day  . lisinopril  20 mg Oral Daily   And  . hydrochlorothiazide  25 mg Oral Daily  . loratadine  10 mg Oral q morning - 10a  . sodium chloride  3 mL Intravenous Q12H    Results for orders placed or performed during the  hospital encounter of 05/22/15 (from the past 48 hour(s))  CBC     Status: None   Collection Time: 05/22/15  1:12 PM  Result Value Ref Range   WBC 6.5 4.0 - 10.5 K/uL   RBC 4.71 4.22 - 5.81 MIL/uL   Hemoglobin 14.3 13.0 - 17.0 g/dL   HCT 43.4 39.0 - 52.0 %   MCV 92.1 78.0 - 100.0 fL   MCH 30.4 26.0 - 34.0 pg   MCHC 32.9 30.0 - 36.0 g/dL   RDW 12.6 11.5 - 15.5 %   Platelets 247 150 - 400 K/uL  Basic metabolic panel     Status: None   Collection Time: 05/22/15  1:12 PM  Result Value Ref Range   Sodium 138 135 - 145 mmol/L   Potassium 4.0 3.5 - 5.1 mmol/L   Chloride 102 101 - 111 mmol/L   CO2 29 22 - 32 mmol/L   Glucose, Bld 89 65 - 99 mg/dL   BUN 13 6 - 20 mg/dL   Creatinine, Ser 0.73 0.61 - 1.24 mg/dL   Calcium 8.9 8.9 - 10.3 mg/dL   GFR calc non Af Amer >60 >60 mL/min   GFR calc Af Amer >60 >60 mL/min  Comment: (NOTE) The eGFR has been calculated using the CKD EPI equation. This calculation has not been validated in all clinical situations. eGFR's persistently <60 mL/min signify possible Chronic Kidney Disease.    Anion gap 7 5 - 15    Mr Hand Left Wo Contrast  05/22/2015   CLINICAL DATA:  Left hand swelling beginning 3 days ago with associated pain which is worst about the first 3 digits. No known injury. The patient has had numbness in the left hand for 2 days. Decreased grip strength.  EXAM: MRI OF THE LEFT HAND WITHOUT CONTRAST  TECHNIQUE: Multiplanar, multisequence MR imaging was performed. No intravenous contrast was administered.  COMPARISON:  CT scan and plain film is of the left hand 03/21/2015.  FINDINGS: Mild subcutaneous edema is seen over the dorsum of the hand. More focal appearing fluid along the dorsal margin of the third MCP joint on the ulnar side measures 2.8 cm craniocaudal by 1.2 cm transverse by 0.3 cm AP. Focal appearing fluid over the dorsum of the fourth MCP joint slightly eccentric to the ulnar side measures 0.5 cm AP by 0.8 cm transverse by 2.4 cm  craniocaudal. Small foci of subchondral edema are seen on both sides of the second MCP joint and in the third metacarpal head. A small cyst or enchondroma is seen in the capitate but incompletely visualized. Mild edema in the distal pole the scaphoid is incompletely imaged. Bone marrow signal is otherwise unremarkable. All imaged tendons are intact and unremarkable in appearance. Visualized carpal tunnel and median nerve appear normal. No mass is identified.  IMPRESSION: Changes of about the second and third MCP joints worrisome for inflammatory arthropathy such as rheumatoid.  Fluid collections along the dorsal margins of the third and fourth MCP joints is likely due to adventitial bursa formation/bursitis. Given subcutaneous edema about the hand, cellulitis and small abscesses are possible although thought less likely. There is no evidence of osteomyelitis.   Electronically Signed   By: Inge Rise M.D.   On: 05/22/2015 14:56   Ct Hand Left W Contrast  05/21/2015   CLINICAL DATA:  Pain numbness and swelling of the left hand, onset 3 days ago.  EXAM: CT OF THE LEFT HAND WITH CONTRAST  TECHNIQUE: Multidetector CT imaging was performed following the standard protocol during bolus administration of intravenous contrast.  CONTRAST:  100 cc Omnipaque 300 intravenous  COMPARISON:  None.  FINDINGS: There is subtle fluid accumulation with peripheral enhancement dorsal to this third metacarpal neck measuring 7 x 4 mm, deep to the extensor tendon. The regional soft tissues are inflamed, with indistinct subcutaneous fat dorsally. There are no acute erosive features or fracture. No tracking along the tendon suggestive of tenosynovitis. The first through third MCP joints show narrowing, smooth/chronic subchondral cysts, and spurring consistent with osteoarthritis. There is also notable thumb interphalangeal osteoarthritis. Palmar ligaments are unremarkable.  IMPRESSION: 1. Inflamed 7 x 4 mm fluid collection dorsal to  the third metatarsal neck likely reflects bursitis, less likely MCP joint effusion. 2. No evidence of osseous infection. 3. First through third MCP osteoarthritis.   Electronically Signed   By: Monte Fantasia M.D.   On: 05/21/2015 04:18   Dg Hand Complete Left  05/21/2015   CLINICAL DATA:  Acute onset of left hand pain and swelling. Initial encounter.  EXAM: LEFT HAND - COMPLETE 3+ VIEW  COMPARISON:  None.  FINDINGS: There is no evidence of fracture or dislocation. The joint spaces are preserved. The carpal rows are intact,  and demonstrate normal alignment. Mild diffuse soft tissue swelling is noted about the hand.  IMPRESSION: No evidence of fracture or dislocation.   Electronically Signed   By: Garald Balding M.D.   On: 05/21/2015 02:01    Review of Systems  All other systems reviewed and are negative.  Blood pressure 133/84, pulse 77, temperature 98.4 F (36.9 C), temperature source Oral, resp. rate 18, height 5' 11"  (1.803 m), weight 122.108 kg (269 lb 3.2 oz), SpO2 93 %. Physical Exam  Constitutional: He appears well-developed and well-nourished.  HENT:  Head: Normocephalic and atraumatic.  Cardiovascular: Normal rate.   Respiratory: Effort normal.  Musculoskeletal:       Left hand: He exhibits tenderness.  Mild tenderness over left index and long metacarpals no sign of C/S or acute infection   Neurological: He is alert.  Skin: Skin is warm.  Psychiatric: He has a normal mood and affect. His behavior is normal. Judgment and thought content normal.    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

## 2015-05-22 NOTE — Progress Notes (Signed)
FPTS Interim Progress Note  S: Trevor Thomas is a 49 y.o. male presenting with left hand edema, pain, and paresethsias.  Reports continued but stable hand pain and swelling, no warmth or redness. Desires oxycodne instead of morphine for pain   O: BP 133/84 mmHg  Pulse 77  Temp(Src) 98.4 F (36.9 C) (Oral)  Resp 18  Ht 5\' 11"  (1.803 m)  Wt 269 lb 3.2 oz (122.108 kg)  BMI 37.56 kg/m2  SpO2 93%   Exam Left Hand: swelling and tenderness of thumb, pointer and middle fingers and first three metacarpals, no erythema, no warmth. Limited ROM but able to move all fingers   MR Hand:  Changes of about the second and third MCP joints worrisome for inflammatory arthropathy such as rheumatoid.  A/P: Trevor Thomas is a 49 y.o. male presenting with left hand edema, pain, and paresethsias. Continued pain but ruled out osteomyelitis, cellulitis  - Hand swelling- consider rheum cause, will consider staring prednisone inpt and getting rheum panel - Will start oxycodone for pain -Advance diet   Bonney Aid, MD 05/22/2015, 6:14 PM PGY-2, [facility] pager [phone]

## 2015-05-22 NOTE — Progress Notes (Signed)
Received page that patient was complaining of decreased sensation in lower part of his left hand. Evaluated at bedside. Patient reported decreased sensation in left thumb, middle and index fingers that began while eating dinner and has lasted about 20 minutes. Tinel's sign negative. Swelling stable. Presented with left hand weakness, which has persisted. Fingers warm and well perfused with good capillary refill. Radial pulse 2+. No pain with passive movement of fingers. He had recently put a brace on his left hand but states the decreased sensation started before then. Reassured patient and his wife with results of his left hand MRI. Asked patient to inform team of any other changes if they arise.

## 2015-05-22 NOTE — H&P (Signed)
Family Medicine Teaching [facility] Admission History and Physical Service Pager: [phone]  Patient name: Trevor Thomas Medical record number: [id number] Date of birth: 1965/10/27 Age: 49 y.o. Gender: male  Primary Care Provider: Renold Don, MD Consultants: Hand Surgery Code Status: Full  Chief Complaint: Left hand swelling, pain, and numbness  Assessment and Plan: Trevor Thomas is a 49 y.o. male presenting with left hand edema, pain, and paresethsias. PMH is significant for HTN, chronic pain, opioid dependence, and tobacco abuse.  Left Hand Pain / Weakness / Paresthesias. Unclear etiology with no precipitating event or injury. Fluid collection on CT may represent reactive process. Possibly acute gout flare (though no prior history). CRP 1.7. No fevers or leukocytosis to suggest infection. Concern for developing compartment syndrome given constellation of progressive pain, weakness, and paresthesia. - Hand surgery consulted, appreciate assistance. - Will obtain hand MRI - Will obtain uric acid level - High dose ibuprofen prn pain (will avoid opioids for now given history of dependence, will add on if pain is uncontrolled.) - Consider course of prednisone if findings more consistent with reactive/inflammatory process  HTN. Well controlled on admission. - Continue home lisinopril-HCTZ  Opioid Dependence. Patient with history of opioid dependence, has been detoxed with clonidine taper, however has reportedly taken friends narcotics and UDS two days ago positive for opiates. - Social work consulted - Continue to monitor for signs of withdrawal.  - UDS ordered  Tobacco Abuse - Continue to encourage smoking cessation - Nicotine patch upon request  Psych. Continue home celexa and ativan.  FEN/GI: NPO until evaluation by hand surgery, Saline lock IV Prophylaxis: SubQ heparin  Disposition: Admitted to floor under attending Dr Jennette Kettle pending above management.   History of Present  Illness: Trevor Thomas is a 49 y.o. male presenting with left hand pain swelling, weakness, and numbness.  Patient reports that his symptoms started approximately 3 days ago when he noticed his left hand swelling. Swelling and pain is mostly located in the first three digits of his left hand. Denies any trauma, injury, or other precipitating events. Two days ago, patient presented to the ED with increased swelling and pain. CT was performed at that time which revealed a fluid collection in his hand. Patient was discharged home with a prescription for doxycycline and keflex.  Patient presented to the [facility] this morning with worsening pain and also complaining of numbness in his left hand for the past 2 days. Patient reports that he has had decreased grip strength and has found it difficulty to pick up objects. Pain improved with naproxen. Patient did not take either doxycycline or keflex. Denies any fevers or chills. Has never had problems with his hands in the past.   Review Of Systems: Per HPI with the following additions: No nausea or vomiting. Otherwise 12 point review of systems was performed and was unremarkable.  Patient Active Problem List   Diagnosis Date Noted  . Narcotic dependence 01/30/2015  . Acute narcotic withdrawal 01/30/2015  . Sebaceous cyst 01/23/2015  . Encounter for chronic pain management 10/28/2014  . Constipation 10/28/2014  . Anemia, iron deficiency 09/02/2014  . Numbness and tingling in left arm 09/02/2014  . Gastric ulcer with hemorrhage 08/25/2014  . Bleeding gastrointestinal   . Internal nasal lesion 06/04/2014  . Knee pain 05/19/2014  . Allergy to bee sting 04/08/2014  . Anxiety associated with depression 04/08/2014  . Restless leg syndrome 10/22/2013  . Chronic back pain 05/01/2013  . Tobacco abuse 09/11/2012  . COPD (chronic obstructive pulmonary  disease) 09/11/2012  . Low back pain radiating to left leg 08/10/2012  . Brachial neuritis or radiculitis  12/07/2010  . Essential hypertension 10/26/2010  . ALLERGIC RHINITIS 10/26/2010   Past Medical History: Past Medical History  Diagnosis Date  . Hypertension   . COPD (chronic obstructive pulmonary disease)   . Bronchitis     hx of  . Asthma   . Concussion 1983  . Headache(784.0)   . Anxiety   . Depression   . Umbilical hernia   . GERD (gastroesophageal reflux disease)     takes tums/rolaids  . Arthritis   . Head trauma   . 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  Secondary to eye surgery   Past Surgical History: Past Surgical History  Procedure Laterality Date  . Knee surgery    . Thumb surgery (left)    . Umbilical hernia repair  05/15/2012    Procedure: HERNIA REPAIR UMBILICAL ADULT;  Surgeon: Clovis Pu. Cornett, MD;  Location: [facility] OR;  Service: General;  Laterality: N/A;  . Hernia repair  05/2012    umbilical hernia  . Eye surgery  20 years ago    Metal foreign body removed from Right eye  . Esophagogastroduodenoscopy N/A 08/25/2014    Procedure: ESOPHAGOGASTRODUODENOSCOPY (EGD);  Surgeon: Louis Meckel, MD;  Location: [facility] ENDOSCOPY;  Service: Endoscopy;  Laterality: N/A;   Social History: Social History  Substance Use Topics  . Smoking status: Current Every Day Smoker -- 0.75 packs/day for 20 years    Types: Cigarettes  . Smokeless tobacco: Never Used  . Alcohol Use: No     Comment: rarely  - quit approx. June 2013   Please also refer to relevant sections of EMR.  Family History: Family History  Problem Relation Age of Onset  . Diabetes Mother   . Diabetes Father   . Heart failure Brother    Allergies and Medications: Allergies  Allergen Reactions  . Bee Venom Anaphylaxis   No current facility-administered medications on file prior to encounter.   Current Outpatient Prescriptions on File Prior to Encounter  Medication Sig Dispense Refill  . albuterol (PROVENTIL HFA;VENTOLIN HFA) 108 (90 BASE) MCG/ACT inhaler Inhale 2 puffs into the lungs every  6 (six) hours as needed for wheezing. 8 g 1  . cephALEXin (KEFLEX) 500 MG capsule Take 1 capsule (500 mg total) by mouth 4 (four) times daily. 40 capsule 0  . citalopram (CELEXA) 40 MG tablet Take 1 tablet (40 mg total) by mouth daily. 30 tablet 3  . cloNIDine (CATAPRES) 0.2 MG tablet Take 0.1 mg 4 times a day for 2 days Take 0.1 mg 2 times a day for 2 days Take 0.1 mg daily for 2 days 14 tablet 0  . dicyclomine (BENTYL) 20 MG tablet Take 1 tablet (20 mg total) by mouth every 6 (six) hours. 20 tablet 0  . doxycycline (VIBRAMYCIN) 100 MG capsule Take 1 capsule (100 mg total) by mouth 2 (two) times daily. 20 capsule 0  . ferrous sulfate 325 (65 FE) MG tablet Take 1 tablet (325 mg total) by mouth daily with breakfast. (Patient not taking: Reported on 03/08/2015) 90 tablet 3  . lisinopril-hydrochlorothiazide (PRINZIDE,ZESTORETIC) 20-25 MG per tablet TAKE ONE TABLET BY MOUTH IN THE MORNING 90 tablet 2  . loratadine (CLARITIN) 10 MG tablet Take 1 tablet (10 mg total) by mouth every morning. 30 tablet 11  . LORazepam (ATIVAN) 0.5 MG tablet Take 1 tablet (0.5 mg total) by mouth  at bedtime as needed for anxiety or sleep. 30 tablet 0  . naproxen (NAPROSYN) 500 MG tablet Take 1 tablet (500 mg total) by mouth 2 (two) times daily. 30 tablet 0  . oxyCODONE-acetaminophen (PERCOCET/ROXICET) 5-325 MG per tablet Take 1 tablet by mouth every 4 (four) hours as needed for severe pain. 12 tablet 0    Objective: BP 133/84 mmHg  Pulse 77  Temp(Src) 98.4 F (36.9 C) (Oral)  Resp 18  Ht  (1.803 m)  Wt 269 lb 3.2 oz (122.108 kg)  BMI 37.56 kg/m2  SpO2 93% Exam: General: 49yo man in NAD sitting on exam table Eyes: EOMI, no scleral icterus ENTM: MMM, O/P clear Neck: Supple, FROM Cardiovascular: RRR, no murmurs appreciated Respiratory: NWOB, mild end-expiratory wheezes diffusely Abdomen: Obese, S, NT, ND MSK: Left hand with significant edema in thumb, pointer, and middle fingers and first three metacarpals.  Tender to palpation. Significantly decreased grip strength on left, though able to touch pointer finger and thumb. Mild overlying erythema. Decreased sensation over areas of edema. Cap refill <2 seconds Skin: Warm, dry Neuro: Alert and conversational, move all extremities spontaneously Psych: Normal affect and thought content.   Labs and Imaging: CBC BMET   Recent Labs Lab 05/20/15 2132  WBC 8.6  HGB 14.1  HCT 42.0  PLT 251    Recent Labs Lab 05/20/15 2132  NA 136  K 3.2*  CL 100*  CO2 27  BUN 11  CREATININE 0.79  GLUCOSE 132*  CALCIUM 9.1     CRP 1.7 UDS positive for THC and opiates Ethanol <5  Ct Hand Left W Contrast  05/21/2015   CLINICAL DATA:  Pain numbness and swelling of the left hand, onset 3 days ago.  EXAM: CT OF THE LEFT HAND WITH CONTRAST  TECHNIQUE: Multidetector CT imaging was performed following the standard protocol during bolus administration of intravenous contrast.  CONTRAST:  100 cc Omnipaque 300 intravenous  COMPARISON:  None.  FINDINGS: There is subtle fluid accumulation with peripheral enhancement dorsal to this third metacarpal neck measuring 7 x 4 mm, deep to the extensor tendon. The regional soft tissues are inflamed, with indistinct subcutaneous fat dorsally. There are no acute erosive features or fracture. No tracking along the tendon suggestive of tenosynovitis. The first through third MCP joints show narrowing, smooth/chronic subchondral cysts, and spurring consistent with osteoarthritis. There is also notable thumb interphalangeal osteoarthritis. Palmar ligaments are unremarkable.  IMPRESSION: 1. Inflamed 7 x 4 mm fluid collection dorsal to the third metatarsal neck likely reflects bursitis, less likely MCP joint effusion. 2. No evidence of osseous infection. 3. First through third MCP osteoarthritis.   Electronically Signed   By: Marnee Spring M.D.   On: 05/21/2015 04:18   Dg Hand Complete Left  05/21/2015   CLINICAL DATA:  Acute onset of left  hand pain and swelling. Initial encounter.  EXAM: LEFT HAND - COMPLETE 3+ VIEW  COMPARISON:  None.  FINDINGS: There is no evidence of fracture or dislocation. The joint spaces are preserved. The carpal rows are intact, and demonstrate normal alignment. Mild diffuse soft tissue swelling is noted about the hand.  IMPRESSION: No evidence of fracture or dislocation.   Electronically Signed   By: Roanna Raider M.D.   On: 05/21/2015 02:01     Ardith Dark, MD 05/22/2015, 11:32 AM PGY-2, [place] [facility] FPTS Intern pager: [phone], text pages welcome

## 2015-05-22 NOTE — Telephone Encounter (Signed)
Lupita Leash is calling from the hospital, we admitted pt this morning and pt did not eat or drink anything prior to his appt. He is at the hospital now and is not allowed to eat or drink anything until they have the doctors orders. Asking for this asap.

## 2015-05-22 NOTE — Progress Notes (Signed)
Subjective:    Trevor Thomas is a 49 y.o. male who presents to [facility] today for Left hand swelling and to talk about narcotics:  1.  Left hand swelling:  Left hand swelling for 3 days. Patient states he was taking out the garbage when he noticed pain in his left second and third digit. Has been taking Motrin without relief. Presented emergency department 2 nights ago. Had x-ray and CT scan which showed fluid along dorsum of third MCP joint. Was prescribed Keflex and naproxen. He has been taking the naproxen. He has not yet started the Keflex. States the pain is continued or worse. Has decreased feeling in his fingers. No fevers. Swelling has also worsened.  2.  Narcotic use:  Please see prior office visit notes for full details. 49 year old male with opioid dependence for the past 7 years or so. He violated his pain contract here at medicine and reenroll her prescribing narcotics. Is been going through withdrawal symptoms. He has been treated with clonidine, Ativan, antiemetics and into the reveals. However it sounds like he is picking up pills intermittently from friends which is prolonging his withdrawal symptoms.) Give him some Suboxone as well. He like to be referred to either Suboxone or methadone clinic. Describes chills, nausea, diarrhea. Inability to sleep at night. Crying spells.   ROS as above per HPI, otherwise neg.    The following portions of the patient's history were reviewed and updated as appropriate: allergies, current medications, past medical history, family and social history, and problem list. Patient is a nonsmoker.    PMH reviewed.  Past Medical History  Diagnosis Date  . Hypertension   . COPD (chronic obstructive pulmonary disease)   . Bronchitis     hx of  . Asthma   . Concussion 1983  . Headache(784.0)   . Anxiety   . Depression   . Umbilical hernia   . GERD (gastroesophageal reflux disease)     takes tums/rolaids  . Arthritis   . Head trauma   . Anisocoria 20  years ago    Chronic, right eye.  Secondary to eye surgery   Past Surgical History  Procedure Laterality Date  . Knee surgery    . Thumb surgery (left)    . Umbilical hernia repair  05/15/2012    Procedure: HERNIA REPAIR UMBILICAL ADULT;  Surgeon: Joyice Faster. Cornett, MD;  Location: [place];  Service: General;  Laterality: N/A;  . Hernia repair  01/6269    umbilical hernia  . Eye surgery  20 years ago    Metal foreign body removed from Right eye  . Esophagogastroduodenoscopy N/A 08/25/2014    Procedure: ESOPHAGOGASTRODUODENOSCOPY (EGD);  Surgeon: Inda Castle, MD;  Location: [place];  Service: Endoscopy;  Laterality: N/A;    Medications reviewed. No current facility-administered medications for this visit.   No current outpatient prescriptions on file.   Facility-Administered Medications Ordered in Other Visits  Medication Dose Route Frequency Provider Last Rate Last Dose  . 0.9 %  sodium chloride infusion  250 mL Intravenous PRN Vivi Barrack, MD      . citalopram (CELEXA) tablet 40 mg  40 mg Oral Daily Vivi Barrack, MD      . gadobenate dimeglumine (MULTIHANCE) injection 20 mL  20 mL Intravenous Once PRN Medication Radiologist, MD      . heparin injection 5,000 Units  5,000 Units Subcutaneous 3 times per day Vivi Barrack, MD      . lisinopril (PRINIVIL,ZESTRIL) tablet 20  mg  20 mg Oral Daily Sara L Neal, MD       And  . hydrochlorothiazide (HYDRODIURIL) tablet 25 mg  25 mg Oral Daily Sara L Neal, MD      . ibuprofen (ADVIL,MOTRIN) tablet 800 mg  800 mg Oral Q6H PRN Caleb M Parker, MD      . loratadine (CLARITIN) tablet 10 mg  10 mg Oral q morning - 10a Caleb M Parker, MD      . LORazepam (ATIVAN) tablet 0.5 mg  0.5 mg Oral QHS PRN Caleb M Parker, MD      . sodium chloride 0.9 % injection 3 mL  3 mL Intravenous Q12H Caleb M Parker, MD      . sodium chloride 0.9 % injection 3 mL  3 mL Intravenous PRN Caleb M Parker, MD         Objective:   Physical Exam BP 142/100 mmHg   Pulse 83  Temp(Src) 98.1 F (36.7 C) (Oral)  Ht 5' 11" (1.803 m)  Wt 269 lb 8 oz (122.244 kg)  BMI 37.60 kg/m2 Gen:  Alert, cooperative patient who appears stated age in no acute distress.  Vital signs reviewed. Disheveled appearing male sitting on edge of bed. Eyes are swollen from crying. HEENT: EOMI,  MMM Cardiac:  Regular rate and rhythm without murmur auscultated.  Good S1/S2. Pulm:  Clear to auscultation  MSK:  Left hand swelling and some purplish stent to dorsum of hand. Tenderness mostly over the second and third metacarpal but also tender throughout thumb. Swelling extends through first second third fourth digit especially when compared to right hand. Difficulty of pain closing hand secondary to both pain and swelling. Unable to test strength secondary to swelling. He has pulses bilateral wrists. He has decreased sensation in second and third digits. Site: Tearful. Labile mood. Declines. PHQ-9 today.  Results for orders placed or performed during the hospital encounter of 05/20/15 (from the past 72 hour(s))  Urine rapid drug screen (hosp performed)     Status: Abnormal   Collection Time: 05/20/15  9:03 PM  Result Value Ref Range   Opiates POSITIVE (A) NONE DETECTED   Cocaine NONE DETECTED NONE DETECTED   Benzodiazepines NONE DETECTED NONE DETECTED   Amphetamines NONE DETECTED NONE DETECTED   Tetrahydrocannabinol POSITIVE (A) NONE DETECTED   Barbiturates NONE DETECTED NONE DETECTED    Comment:        DRUG SCREEN FOR MEDICAL PURPOSES ONLY.  IF CONFIRMATION IS NEEDED FOR ANY PURPOSE, NOTIFY LAB WITHIN 5 DAYS.        LOWEST DETECTABLE LIMITS FOR URINE DRUG SCREEN Drug Class       Cutoff (ng/mL) Amphetamine      1000 Barbiturate      200 Benzodiazepine   200 Tricyclics       300 Opiates          300 Cocaine          300 THC              50   Comprehensive metabolic panel     Status: Abnormal   Collection Time: 05/20/15  9:32 PM  Result Value Ref Range   Sodium 136 135  - 145 mmol/L   Potassium 3.2 (L) 3.5 - 5.1 mmol/L   Chloride 100 (L) 101 - 111 mmol/L   CO2 27 22 - 32 mmol/L   Glucose, Bld 132 (H) 65 - 99 mg/dL   BUN 11 6 - 20 mg/dL     Creatinine, Ser 0.79 0.61 - 1.24 mg/dL   Calcium 9.1 8.9 - 10.3 mg/dL   Total Protein 7.0 6.5 - 8.1 g/dL   Albumin 3.8 3.5 - 5.0 g/dL   AST 22 15 - 41 U/L   ALT 22 17 - 63 U/L   Alkaline Phosphatase 93 38 - 126 U/L   Total Bilirubin 0.3 0.3 - 1.2 mg/dL   GFR calc non Af Amer >60 >60 mL/min   GFR calc Af Amer >60 >60 mL/min    Comment: (NOTE) The eGFR has been calculated using the CKD EPI equation. This calculation has not been validated in all clinical situations. eGFR's persistently <60 mL/min signify possible Chronic Kidney Disease.    Anion gap 9 5 - 15  Ethanol (ETOH)     Status: None   Collection Time: 05/20/15  9:32 PM  Result Value Ref Range   Alcohol, Ethyl (B) <5 <5 mg/dL    Comment:        LOWEST DETECTABLE LIMIT FOR SERUM ALCOHOL IS 5 mg/dL FOR MEDICAL PURPOSES ONLY   Salicylate level     Status: None   Collection Time: 05/20/15  9:32 PM  Result Value Ref Range   Salicylate Lvl <6.2 2.8 - 30.0 mg/dL  Acetaminophen level     Status: Abnormal   Collection Time: 05/20/15  9:32 PM  Result Value Ref Range   Acetaminophen (Tylenol), Serum <10 (L) 10 - 30 ug/mL    Comment:        THERAPEUTIC CONCENTRATIONS VARY SIGNIFICANTLY. A RANGE OF 10-30 ug/mL MAY BE AN EFFECTIVE CONCENTRATION FOR MANY PATIENTS. HOWEVER, SOME ARE BEST TREATED AT CONCENTRATIONS OUTSIDE THIS RANGE. ACETAMINOPHEN CONCENTRATIONS >150 ug/mL AT 4 HOURS AFTER INGESTION AND >50 ug/mL AT 12 HOURS AFTER INGESTION ARE OFTEN ASSOCIATED WITH TOXIC REACTIONS.   CBC     Status: None   Collection Time: 05/20/15  9:32 PM  Result Value Ref Range   WBC 8.6 4.0 - 10.5 K/uL   RBC 4.58 4.22 - 5.81 MIL/uL   Hemoglobin 14.1 13.0 - 17.0 g/dL   HCT 42.0 39.0 - 52.0 %   MCV 91.7 78.0 - 100.0 fL   MCH 30.8 26.0 - 34.0 pg   MCHC 33.6 30.0  - 36.0 g/dL   RDW 12.6 11.5 - 15.5 %   Platelets 251 150 - 400 K/uL  CBG monitoring, ED     Status: Abnormal   Collection Time: 05/20/15 10:12 PM  Result Value Ref Range   Glucose-Capillary 138 (H) 65 - 99 mg/dL   Comment 1 Notify RN    Comment 2 Document in Chart   C-reactive protein     Status: Abnormal   Collection Time: 05/21/15  1:15 AM  Result Value Ref Range   CRP 1.7 (H) <1.0 mg/dL

## 2015-05-22 NOTE — Progress Notes (Signed)
Pt c/o decreased sensation on lt hand and reports it started 30 mins ago.  Pt has very weak grip on lt hand. On-call provider paged. Awaiting orders. MD to see patient at bedside. Will monitor.

## 2015-05-22 NOTE — Assessment & Plan Note (Signed)
CT scan showed bursitis third metacarpal phalangeal joint of left hand. However now swelling has extended along the first and second metacarpal joints. Pain is worsened. -Does not appear to be obvious cellulitis. No redness. No noticeable warmth. He does have decreased sensation in his fingers. It is difficult to get compartment syndrome with hand but I think this warrants further workup in-house. We'll send for admission. MRI of left hand to further evaluate left soft tissue swelling.

## 2015-05-23 DIAGNOSIS — M79642 Pain in left hand: Secondary | ICD-10-CM | POA: Insufficient documentation

## 2015-05-23 MED ORDER — CEPHALEXIN 500 MG PO CAPS: 500.0000 mg | ORAL_CAPSULE | Freq: Four times a day (QID) | ORAL | Status: DC

## 2015-05-23 MED ORDER — PREDNISONE 20 MG PO TABS: 40.0000 mg | ORAL_TABLET | Freq: Every day | ORAL | Status: DC

## 2015-05-23 MED ORDER — CEPHALEXIN 250 MG PO CAPS
250.0000 mg | ORAL_CAPSULE | Freq: Four times a day (QID) | ORAL | Status: DC
  Administered 2015-05-23: 250 mg via ORAL
  Filled 2015-05-23 (×4): qty 1

## 2015-05-23 NOTE — Discharge Instructions (Signed)
The pain and swelling in your hand is being caused by an inflammatory process in the soft tissue. We have prescribed a short (10 day) course of antibiotics. This should help reduce the inflammation if there is a bacterial component to this ailment. We have also prescribed a 5 day course of Prednisone. This too should help with the swelling and pain of this region.   At this point we are unsure of the true cause. I would encourage you to speak with your primary care physician about his view on obtaining additional labs to check for other causes of inflammation (ie. Autoimmune).   Please call your Primary Care Provider's office first thing Monday morning to schedule a hospital follow up appointment in the next week or 2.

## 2015-05-23 NOTE — Progress Notes (Signed)
Nsg Discharge Note  Admit Date:  05/22/2015 Discharge date: 05/23/2015   Trevor Thomas to be D/C'd Home per MD order.  AVS completed.  Copy for chart, and copy for patient signed, and dated. Patient/caregiver able to verbalize understanding.  Discharge Medication:   Medication List    STOP taking these medications        dicyclomine 20 MG tablet  Commonly known as:  BENTYL     doxycycline 100 MG capsule  Commonly known as:  VIBRAMYCIN     ferrous sulfate 325 (65 FE) MG tablet      TAKE these medications        albuterol 108 (90 BASE) MCG/ACT inhaler  Commonly known as:  PROVENTIL HFA;VENTOLIN HFA  Inhale 2 puffs into the lungs every 6 (six) hours as needed for wheezing.     cephALEXin 500 MG capsule  Commonly known as:  KEFLEX  Take 1 capsule (500 mg total) by mouth 4 (four) times daily.     citalopram 40 MG tablet  Commonly known as:  CELEXA  Take 1 tablet (40 mg total) by mouth daily.     lisinopril-hydrochlorothiazide 20-25 MG per tablet  Commonly known as:  PRINZIDE,ZESTORETIC  TAKE ONE TABLET BY MOUTH IN THE MORNING     loratadine 10 MG tablet  Commonly known as:  CLARITIN  Take 1 tablet (10 mg total) by mouth every morning.     LORazepam 0.5 MG tablet  Commonly known as:  ATIVAN  Take 1 tablet (0.5 mg total) by mouth at bedtime as needed for anxiety or sleep.     naproxen 500 MG tablet  Commonly known as:  NAPROSYN  Take 1 tablet (500 mg total) by mouth 2 (two) times daily.     oxyCODONE-acetaminophen 5-325 MG per tablet  Commonly known as:  PERCOCET/ROXICET  Take 1 tablet by mouth every 4 (four) hours as needed for severe pain.     predniSONE 20 MG tablet  Commonly known as:  DELTASONE  Take 2 tablets (40 mg total) by mouth daily with breakfast.  Start taking on:  05/24/2015        Discharge Assessment: Filed Vitals:   05/23/15 1341  BP: 120/80  Pulse: 70  Temp: 98.2 F (36.8 C)  Resp: 18   Skin clean, dry and intact without evidence of  skin break down, no evidence of skin tears noted. IV catheter discontinued intact. Site without signs and symptoms of complications - no redness or edema noted at insertion site, patient denies c/o pain - only slight tenderness at site.  Dressing with slight pressure applied.  D/c Instructions-Education: Discharge instructions given to patient/family with verbalized understanding. D/c education completed with patient/family including follow up instructions, medication list, d/c activities limitations if indicated, with other d/c instructions as indicated by MD - patient able to verbalize understanding, all questions fully answered. Patient instructed to return to ED, call 911, or call MD for any changes in condition.  Patient escorted via WC, and D/C home via private auto.  Filomena Pokorney Consuella Lose, RN 05/23/2015 2:14 PM

## 2015-05-23 NOTE — Discharge Summary (Signed)
Family Medicine Teaching [facility] Discharge Summary  Patient name: Trevor Thomas Medical record number: [id number] Date of birth: 04-18-1966 Age: 49 y.o. Gender: male Date of Admission: 05/22/2015  Date of Discharge: 05/23/2015 Admitting Physician: Tobey Grim, MD  Primary Care Provider: Renold Don, MD Consultants: Ortho  Indication for Hospitalization:  Left hand pain, swelling, numbness  Discharge Diagnoses/Problem List:  Inflammatory process of unknown etiology, Left Hand/3rd metacarpal  Disposition: Home  Discharge Condition: Stable  Discharge Exam:  General: 49yo man in NAD, appears anxious HEENT: EOMI, no scleral icterus, MMM, O/P clear. Neck Supple, FROM Cardiovascular: RRR, no murmurs Respiratory: NWOB, CTAB Abdomen: Obese, S, NT, ND MSK: Left hand with edematous soft tissue of distal 3rd metacarpal thumb. Tender to palpation throughout 3rd metacarpal. FROM, but diminished strength 2/2 pain. Mild overlying erythema. Patient's reports of sensation and strength inconsistent, making assessment difficult. Skin: Warm, dry Neuro: Alert and conversational, move all extremities spontaneously  Brief Hospital Course:  Patient admitted for pain, swelling, and weakness/numbness of left hand x3 days prior to admission. Had been seen previously for this issue and prescribed keflex and doxycycline. Patient did not pick up either medications. Fluid collection noted on CT. MRI was obtained which showed fluid collection at the dorsal 3rd/4th MCP joints. No evidence of osteomyelitis. Orthopaedics was consulted. They did not see any indication for surgery. Uric acid was normal at 5.8 making gout unlikely. CRP was slightly elevated at 1.7. Patient's WBC was 6.5. Because of these findings it was felt that patient's symptoms were most likely from an inflammatory arthropathy although mild cellulitis could not be entirely ruled out. Patient was discharged with new prescription for Keflex  ordered. 5 day course of Prednisone was also prescribed.  Issues for Follow Up:  1. Patient sent out on Keflex -- previous abx prescriptions have not been filled. Check compliance. 2. No labs obtained to test for autoimmune etiology. Consideration to obtain these, especially if symptoms persist, worsen, or become bilateral.  Significant Procedures: MRI, CT  Significant Labs and Imaging:   Recent Labs Lab 05/20/15 2132 05/22/15 1312  WBC 8.6 6.5  HGB 14.1 14.3  HCT 42.0 43.4  PLT 251 247    Recent Labs Lab 05/20/15 2132 05/22/15 1312  NA 136 138  K 3.2* 4.0  CL 100* 102  CO2 27 29  GLUCOSE 132* 89  BUN 11 13  CREATININE 0.79 0.73  CALCIUM 9.1 8.9  ALKPHOS 93  --   AST 22  --   ALT 22  --   ALBUMIN 3.8  --    MRI Left Hand 8/19 IMPRESSION: Changes of about the second and third MCP joints worrisome for inflammatory arthropathy such as rheumatoid.  Fluid collections along the dorsal margins of the third and fourth MCP joints is likely due to adventitial bursa formation/bursitis. Given subcutaneous edema about the hand, cellulitis and small abscesses are possible although thought less likely. There is no evidence of osteomyelitis.  Results/Tests Pending at Time of Discharge: none  Discharge Medications:    Medication List    STOP taking these medications        dicyclomine 20 MG tablet  Commonly known as:  BENTYL     doxycycline 100 MG capsule  Commonly known as:  VIBRAMYCIN     ferrous sulfate 325 (65 FE) MG tablet      TAKE these medications        albuterol 108 (90 BASE) MCG/ACT inhaler  Commonly known as:  PROVENTIL HFA;VENTOLIN HFA  Inhale 2 puffs into the lungs every 6 (six) hours as needed for wheezing.     cephALEXin 500 MG capsule  Commonly known as:  KEFLEX  Take 1 capsule (500 mg total) by mouth 4 (four) times daily.     citalopram 40 MG tablet  Commonly known as:  CELEXA  Take 1 tablet (40 mg total) by mouth daily.      lisinopril-hydrochlorothiazide 20-25 MG per tablet  Commonly known as:  PRINZIDE,ZESTORETIC  TAKE ONE TABLET BY MOUTH IN THE MORNING     loratadine 10 MG tablet  Commonly known as:  CLARITIN  Take 1 tablet (10 mg total) by mouth every morning.     LORazepam 0.5 MG tablet  Commonly known as:  ATIVAN  Take 1 tablet (0.5 mg total) by mouth at bedtime as needed for anxiety or sleep.     naproxen 500 MG tablet  Commonly known as:  NAPROSYN  Take 1 tablet (500 mg total) by mouth 2 (two) times daily.     oxyCODONE-acetaminophen 5-325 MG per tablet  Commonly known as:  PERCOCET/ROXICET  Take 1 tablet by mouth every 4 (four) hours as needed for severe pain.     predniSONE 20 MG tablet  Commonly known as:  DELTASONE  Take 2 tablets (40 mg total) by mouth daily with breakfast.  Start taking on:  05/24/2015        Discharge Instructions: Please refer to Patient Instructions section of EMR for full details.  Patient was counseled important signs and symptoms that should prompt return to medical care, changes in medications, dietary instructions, activity restrictions, and follow up appointments.   Follow-Up Appointments: Follow-up Information    Schedule an appointment as soon as possible for a visit with Renold Don, MD.   Specialty:  Family Medicine   Contact information:   [address] [place] [address] [phone]       Kathee Delton, MD 05/23/2015, 2:06 PM PGY-2, [facility] Family [facility]

## 2015-05-25 ENCOUNTER — Telehealth: Payer: Self-pay | Admitting: Family Medicine

## 2015-05-25 NOTE — Telephone Encounter (Signed)
Pt calling because he states that it is urgent that he speak with Dr. Gwendolyn Grant asap. Sadie Reynolds, ASA

## 2015-05-25 NOTE — Telephone Encounter (Signed)
I'm in [facility] this PM and may not have a chance to return his call.  Please forward this to an triage nurse if it is truly urgent.

## 2015-05-26 NOTE — Telephone Encounter (Signed)
Routed to [employer].  Clovis Pu, RN

## 2015-05-27 NOTE — Telephone Encounter (Signed)
Patient called today stating that there was "something else" that he needed to speak with you about.  Will forward to Camelia Phenes
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
## 2015-05-29 ENCOUNTER — Other Ambulatory Visit: Payer: Self-pay | Admitting: Family Medicine

## 2015-06-27 ENCOUNTER — Other Ambulatory Visit: Payer: Self-pay | Admitting: Family Medicine

## 2015-06-30 ENCOUNTER — Encounter (HOSPITAL_COMMUNITY): Payer: Self-pay | Admitting: *Deleted

## 2015-06-30 ENCOUNTER — Emergency Department (HOSPITAL_COMMUNITY)
Admission: EM | Admit: 2015-06-30 | Discharge: 2015-06-30 | Disposition: A | Discharge status: Home / Self Care | Payer: Self-pay | Attending: Emergency Medicine | Admitting: Emergency Medicine

## 2015-06-30 ENCOUNTER — Emergency Department (HOSPITAL_COMMUNITY): Payer: Self-pay

## 2015-06-30 DIAGNOSIS — Z87828 Personal history of other (healed) physical injury and trauma: Secondary | ICD-10-CM | POA: Insufficient documentation

## 2015-06-30 DIAGNOSIS — J449 Chronic obstructive pulmonary disease, unspecified: Secondary | ICD-10-CM | POA: Insufficient documentation

## 2015-06-30 DIAGNOSIS — Z79899 Other long term (current) drug therapy: Secondary | ICD-10-CM | POA: Insufficient documentation

## 2015-06-30 DIAGNOSIS — I10 Essential (primary) hypertension: Secondary | ICD-10-CM | POA: Insufficient documentation

## 2015-06-30 DIAGNOSIS — F419 Anxiety disorder, unspecified: Secondary | ICD-10-CM | POA: Insufficient documentation

## 2015-06-30 DIAGNOSIS — Z8669 Personal history of other diseases of the nervous system and sense organs: Secondary | ICD-10-CM | POA: Insufficient documentation

## 2015-06-30 DIAGNOSIS — M7989 Other specified soft tissue disorders: Secondary | ICD-10-CM

## 2015-06-30 DIAGNOSIS — M1990 Unspecified osteoarthritis, unspecified site: Secondary | ICD-10-CM | POA: Insufficient documentation

## 2015-06-30 DIAGNOSIS — Z720 Tobacco use: Secondary | ICD-10-CM | POA: Insufficient documentation

## 2015-06-30 DIAGNOSIS — K219 Gastro-esophageal reflux disease without esophagitis: Secondary | ICD-10-CM | POA: Insufficient documentation

## 2015-06-30 LAB — CBC WITH DIFFERENTIAL/PLATELET
Basophils Absolute: 0 K/uL (ref 0.0–0.1)
Basophils Relative: 0 %
Eosinophils Absolute: 0.2 K/uL (ref 0.0–0.7)
Eosinophils Relative: 2 %
HCT: 44.1 % (ref 39.0–52.0)
Hemoglobin: 14.6 g/dL (ref 13.0–17.0)
Lymphocytes Relative: 28 %
Lymphs Abs: 3 K/uL (ref 0.7–4.0)
MCH: 30.9 pg (ref 26.0–34.0)
MCHC: 33.1 g/dL (ref 30.0–36.0)
MCV: 93.2 fL (ref 78.0–100.0)
Monocytes Absolute: 0.8 K/uL (ref 0.1–1.0)
Monocytes Relative: 7 %
Neutro Abs: 6.7 K/uL (ref 1.7–7.7)
Neutrophils Relative %: 63 %
Platelets: 240 K/uL (ref 150–400)
RBC: 4.73 MIL/uL (ref 4.22–5.81)
RDW: 12.6 % (ref 11.5–15.5)
WBC: 10.7 K/uL — ABNORMAL HIGH (ref 4.0–10.5)

## 2015-06-30 LAB — BASIC METABOLIC PANEL WITH GFR
Anion gap: 8 (ref 5–15)
BUN: 16 mg/dL (ref 6–20)
CO2: 27 mmol/L (ref 22–32)
Calcium: 8.9 mg/dL (ref 8.9–10.3)
Chloride: 102 mmol/L (ref 101–111)
Creatinine, Ser: 0.73 mg/dL (ref 0.61–1.24)
GFR calc Af Amer: >60 mL/min
GFR calc non Af Amer: >60 mL/min
Glucose, Bld: 107 mg/dL — ABNORMAL HIGH (ref 65–99)
Potassium: 3.7 mmol/L (ref 3.5–5.1)
Sodium: 137 mmol/L (ref 135–145)

## 2015-06-30 MED ORDER — LISINOPRIL-HYDROCHLOROTHIAZIDE 20-25 MG PO TABS: 1.0000 | ORAL_TABLET | Freq: Every morning | ORAL | Status: DC

## 2015-06-30 MED ORDER — OXYCODONE-ACETAMINOPHEN 5-325 MG PO TABS
1.0000 | ORAL_TABLET | Freq: Once | ORAL | Status: AC
  Administered 2015-06-30: 1 via ORAL
  Filled 2015-06-30: qty 1

## 2015-06-30 NOTE — ED Notes (Signed)
Pt states "was in the hospital @ [facility] for 2 days, my doctor saw my hand and he just cut me off from all my pain medicine that I had been taking for 7 yrs.  [place] and [place] did an MRI and said I had a fx but [facility] said they didn't see anything."  Pt presents with edema to left hand & c/o being unable to move fingers.

## 2015-06-30 NOTE — Discharge Instructions (Signed)
Edema °Edema is an abnormal buildup of fluids in your body tissues. Edema is somewhat dependent on gravity to pull the fluid to the lowest place in your body. That makes the condition more common in the legs and thighs (lower extremities). Painless swelling of the feet and ankles is common and becomes more likely as you get older. It is also common in looser tissues, like around your eyes.  °When the affected area is squeezed, the fluid may move out of that spot and leave a dent for a few moments. This dent is called pitting.  °CAUSES  °There are many possible causes of edema. Eating too much salt and being on your feet or sitting for a long time can cause edema in your legs and ankles. Hot weather may make edema worse. Common medical causes of edema include: °· Heart failure. °· Liver disease. °· Kidney disease. °· Weak blood vessels in your legs. °· Cancer. °· An injury. °· Pregnancy. °· Some medications. °· Obesity.  °SYMPTOMS  °Edema is usually painless. Your skin may look swollen or shiny.  °DIAGNOSIS  °Your health care provider may be able to diagnose edema by asking about your medical history and doing a physical exam. You may need to have tests such as X-rays, an electrocardiogram, or blood tests to check for medical conditions that may cause edema.  °TREATMENT  °Edema treatment depends on the cause. If you have heart, liver, or kidney disease, you need the treatment appropriate for these conditions. General treatment may include: °· Elevation of the affected body part above the level of your heart. °· Compression of the affected body part. Pressure from elastic bandages or support stockings squeezes the tissues and forces fluid back into the blood vessels. This keeps fluid from entering the tissues. °· Restriction of fluid and salt intake. °· Use of a water pill (diuretic). These medications are appropriate only for some types of edema. They pull fluid out of your body and make you urinate more often. This  gets rid of fluid and reduces swelling, but diuretics can have side effects. Only use diuretics as directed by your health care provider. °HOME CARE INSTRUCTIONS  °· Keep the affected body part above the level of your heart when you are lying down.   °· Do not sit still or stand for prolonged periods.   °· Do not put anything directly under your knees when lying down. °· Do not wear constricting clothing or garters on your upper legs.   °· Exercise your legs to work the fluid back into your blood vessels. This may help the swelling go down.   °· Wear elastic bandages or support stockings to reduce ankle swelling as directed by your health care provider.   °· Eat a low-salt diet to reduce fluid if your health care provider recommends it.   °· Only take medicines as directed by your health care provider.  °SEEK MEDICAL CARE IF:  °· Your edema is not responding to treatment. °· You have heart, liver, or kidney disease and notice symptoms of edema. °· You have edema in your legs that does not improve after elevating them.   °· You have sudden and unexplained weight gain. °SEEK IMMEDIATE MEDICAL CARE IF:  °· You develop shortness of breath or chest pain.   °· You cannot breathe when you lie down. °· You develop pain, redness, or warmth in the swollen areas.   °· You have heart, liver, or kidney disease and suddenly get edema. °· You have a fever and your symptoms suddenly get worse. °MAKE SURE YOU:  °·   Understand these instructions.  Will watch your condition.  Will get help right away if you are not doing well or get worse. Document Released: 09/19/2005 Document Revised: 02/03/2014 Document Reviewed: 07/12/2013 [facility] Patient Information 2015 [place], [place]. This information is not intended to replace advice given to you by your health care provider. Make sure you discuss any questions you have with your health care provider.  Alternate ibuprofen and Tylenol for inflammation and pain control. Apply ice to  affected area keep hand elevated. Follow-up with PCP as soon as possible. Will need referral to rheumatology. Will not give one at this time due to patient's lack of [employer] insurance. Hopefully patient will be able to get this referral through his PCP. Return to the emergency department if you notice redness in the hand, fevers, worsening of symptoms.

## 2015-06-30 NOTE — ED Provider Notes (Signed)
CSN: [id number]     Arrival date & time 06/30/15  1635 History   First MD Initiated Contact with Patient 06/30/15 1808     Chief Complaint  Patient presents with  . Hand Pain     (Consider location/radiation/quality/duration/timing/severity/associated sxs/prior Treatment) HPI Comments: Trevor Thomas is a 49 y.o M with a pmhx of chronic pain, L hand swelling for which he was recently admitted for, determined to be inflammatory, who presents to the emergency department today complaining of left hand pain. Patient states that since his discharge he has been unable to get his pain medication because he broke his pain contract. Patient states he was vacationing at the beach 3 weeks ago when he fell and hit his left hand. He was seen at an urgent care/emergency department in [facility] that told him that has left him was broken and the placed him in a hard cast that he took off 4 days later because it was uncomfortable. Patient states his hand looks the same as it did upon discharge 8/19. Patient is unable to extend fingers due to pain.   Per hospitalist note: Patient admitted for pain, swelling, and weakness/numbness of left hand x3 days prior to admission. Had been seen previously for this issue and prescribed keflex and doxycycline. Patient did not pick up either medications. Fluid collection noted on CT. MRI was obtained which showed fluid collection at the dorsal 3rd/4th MCP joints. No evidence of osteomyelitis. Orthopaedics was consulted. They did not see any indication for surgery. Uric acid was normal at 5.8 making gout unlikely. CRP was slightly elevated at 1.7. Patient's WBC was 6.5. Because of these findings it was felt that patient's symptoms were most likely from an inflammatory arthropathy although mild cellulitis could not be entirely ruled out. Patient was discharged with new prescription for Keflex ordered. 5 day course of Prednisone was also prescribed  Patient is a 49 y.o. male  presenting with hand pain. The history is provided by the patient.  Hand Pain    Past Medical History  Diagnosis Date  . Hypertension   . COPD (chronic obstructive pulmonary disease)   . Bronchitis     hx of  . Asthma   . Concussion 1983  . Headache(784.0)   . Anxiety   . Depression   . Umbilical hernia   . GERD (gastroesophageal reflux disease)     takes tums/rolaids  . Arthritis   . Head trauma   . 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 Knee surgery    . Thumb surgery (left)    . Umbilical hernia repair  05/15/2012    Procedure: HERNIA REPAIR UMBILICAL ADULT;  Surgeon: Clovis Pu. Cornett, MD;  Location: [facility] OR;  Service: General;  Laterality: N/A;  . Hernia repair  05/2012    umbilical hernia  . Eye surgery  20 years ago    Metal foreign body removed from Right eye  . Esophagogastroduodenoscopy N/A 08/25/2014    Procedure: ESOPHAGOGASTRODUODENOSCOPY (EGD);  Surgeon: Louis Meckel, MD;  Location[facility] ENDOSCOPY;  Service: Endoscopy;  Laterality: N/A;   Family History  Problem Relation Age of Onset  . Diabetes Mother   . Diabetes Father   . Heart failure Brother    Social History  Substance Use Topics  . Smoking status: Current Every Day Smoker -- 1.00 packs/day for 20 years    Types: Cigarettes  .  Smokeless tobacco: Never Used  . Alcohol Use: No     Comment: rarely  - quit approx. June 2013    Review of Systems  All other systems reviewed and are negative.     Allergies  Bee venom and Clonidine derivatives  Home Medications   Prior to Admission medications   Medication Sig Start Date End Date Taking? Authorizing Provider  albuterol (PROVENTIL HFA;VENTOLIN HFA) 108 (90 BASE) MCG/ACT inhaler Inhale 2 puffs into the lungs every 6 (six) hours as needed for wheezing. 09/02/14  Yes Tobey Grim, MD  lisinopril-hydrochlorothiazide (PRINZIDE,ZESTORETIC) 20-25 MG per tablet  TAKE ONE TABLET BY MOUTH IN THE MORNING 01/14/15  Yes Tobey Grim, MD  cephALEXin (KEFLEX) 500 MG capsule Take 1 capsule (500 mg total) by mouth 4 (four) times daily. Patient not taking: Reported on 06/30/2015 05/23/15   Kathee Delton, MD  citalopram (CELEXA) 40 MG tablet TAKE ONE TABLET BY MOUTH ONCE DAILY Patient taking differently: TAKE 40 MG BY MOUTH DAILY 05/29/15   Tobey Grim, MD  loratadine (CLARITIN) 10 MG tablet Take 1 tablet (10 mg total) by mouth every morning. Patient not taking: Reported on 06/30/2015 01/14/15   Tobey Grim, MD  LORazepam (ATIVAN) 0.5 MG tablet TAKE ONE TABLET BY MOUTH AT BEDTIME AS NEEDED FOR ANXIETY AND SLEEP Patient not taking: Reported on 06/30/2015 05/29/15   Tobey Grim, MD  naproxen (NAPROSYN) 500 MG tablet Take 1 tablet (500 mg total) by mouth 2 (two) times daily. Patient not taking: Reported on 06/30/2015 05/21/15   [name], MD  oxyCODONE-acetaminophen (PERCOCET/ROXICET) 5-325 MG per tablet Take 1 tablet by mouth every 4 (four) hours as needed for severe pain. Patient not taking: Reported on 06/30/2015 05/21/15   [name], MD  predniSONE (DELTASONE) 20 MG tablet Take 2 tablets (40 mg total) by mouth daily with breakfast. Patient not taking: Reported on 06/30/2015 05/24/15   Kathee Delton, MD   BP 140/91 mmHg  Pulse 100  Temp(Src) 98.2 F (36.8 C) (Oral)  Resp 16  SpO2 100% Physical Exam  Constitutional: He is oriented to person, place, and time. He appears well-developed and well-nourished. No distress.  HENT:  Head: Normocephalic and atraumatic.  Mouth/Throat: No oropharyngeal exudate.  Eyes: Conjunctivae and EOM are normal. Pupils are equal, round, and reactive to light. Right eye exhibits no discharge. Left eye exhibits no discharge. No scleral icterus.  Cardiovascular: Normal rate, regular rhythm, normal heart sounds and intact distal pulses.  Exam reveals no gallop and no friction rub.   No murmur heard. Pulmonary/Chest:  Effort normal and breath sounds normal. No respiratory distress. He has no wheezes. He has no rales. He exhibits no tenderness.  Abdominal: Soft. He exhibits no distension and no mass. There is no tenderness. There is no rebound and no guarding.  Musculoskeletal: Normal range of motion. He exhibits no edema.  Extremely swollen left hand balled into a fist. Pt unable to extend fingers due to pain limitation. Non erythematous. Not warm to touch. No sign of infection, No red streaking.  No evidence of tendon injury   Neurological: He is alert and oriented to person, place, and time. No cranial nerve deficit.  Strength 5/5 throughout. No sensory deficits.    Skin: Skin is warm and dry. No rash noted. He is not diaphoretic. No erythema. No pallor.  Psychiatric: He has a normal mood and affect. His behavior is normal.  Nursing note and vitals reviewed.   ED Course  Procedures (including critical care time) Labs Review Labs Reviewed  BASIC METABOLIC PANEL - Abnormal; Notable for the following:    Glucose, Bld 107 (*)    All other components within normal limits  CBC WITH DIFFERENTIAL/PLATELET - Abnormal; Notable for the following:    WBC 10.7 (*)    All other components within normal limits    Imaging Review Dg Hand Complete Left  06/30/2015   CLINICAL DATA:  Left hand pain. Recent fall downstairs, an outside imaging demonstrating fracture first metacarpal. Increasing pain at the fracture site and throughout the left hand. Recent hospitalization due to "Red streak running up left arm "  EXAM: LEFT HAND - COMPLETE 3+ VIEW  COMPARISON:  Radiographs, CT, and MRI of the hand 05/21/2015  FINDINGS: No acute fracture, with particular attention to the first metacarpal. No evidence of healing fracture. Joint space narrowing and osteophytes about the the metacarpophalangeal and interphalangeal joints are unchanged from prior exam. Scattered joint space narrowing about the second and third metacarpal  phalangeal joint. No bony destructive change or focal erosion. There is mild dorsal soft tissue edema. No soft tissue air or radiopaque foreign body. Assessment of the distal digits partially obscured due to limited positioning related to pain.  IMPRESSION: No evidence of acute or healing fracture. Dorsal soft tissue edema is unchanged from prior exam.   Electronically Signed   By: Rubye Oaks M.D.   On: 06/30/2015 19:16   I have personally reviewed and evaluated these images and lab results as part of my medical decision-making.   EKG Interpretation None      MDM   Final diagnoses:  Swelling of left hand    Patient seen for swelling of left hand. Patient recently admitted and discharged for same issue. Felt to be inflammatory. Patient was told that he broke his hand 2 weeks ago. X-ray hand shows no acute fracture or evidence of healing fracture. No evidence of infection at this time. Patient completed antibiotics that he was discharged home with. Patient given pain medication in the emergency department, will not give home pain meds due to pain contract. Recommend follow-up with PCP. Recommend following up with rheumatology outpatient. Vital signs stable. Discussed treatment plan with patient who is agreeable. No further imaging necessary at this time. Patient recently had MRI and CT of hand.  Will refill home blood pressure medication as he is out and has not been able to see his PCP.  Return precautions outlined in patient discharge instructions.      Lester Kinsman [place], PA-C 07/01/15 0151  Lyndal Pulley, MD 07/01/15 [phone]

## 2015-07-01 MED ORDER — TRAMADOL HCL 50 MG PO TABS: ORAL_TABLET | ORAL | Status: DC

## 2015-07-01 MED ORDER — LORAZEPAM 0.5 MG PO TABS: ORAL_TABLET | ORAL | Status: DC

## 2015-07-01 NOTE — Addendum Note (Signed)
Addended byGwendolyn Grant, Newt Lukes on: 07/01/2015 03:24 PM   Modules accepted: Orders

## 2015-07-27 ENCOUNTER — Other Ambulatory Visit: Payer: Self-pay | Admitting: Family Medicine

## 2015-08-04 ENCOUNTER — Other Ambulatory Visit: Payer: Self-pay | Admitting: Family Medicine

## 2015-08-04 ENCOUNTER — Ambulatory Visit: Payer: Self-pay | Admitting: Family Medicine

## 2015-08-04 MED ORDER — LORATADINE 10 MG PO TABS: 10.0000 mg | ORAL_TABLET | Freq: Every morning | ORAL | Status: DC

## 2015-08-04 MED ORDER — LISINOPRIL-HYDROCHLOROTHIAZIDE 20-25 MG PO TABS: 1.0000 | ORAL_TABLET | Freq: Every morning | ORAL | Status: DC

## 2015-08-04 MED ORDER — CITALOPRAM HYDROBROMIDE 40 MG PO TABS: 40.0000 mg | ORAL_TABLET | Freq: Every day | ORAL | Status: DC

## 2015-08-04 MED ORDER — LORAZEPAM 0.5 MG PO TABS: ORAL_TABLET | ORAL | Status: DC

## 2015-08-04 NOTE — Telephone Encounter (Signed)
Pt called and needs refills on his Celexa, lorazepam, Lisinopril, and Loratadine. jw

## 2015-08-14 ENCOUNTER — Ambulatory Visit (INDEPENDENT_AMBULATORY_CARE_PROVIDER_SITE_OTHER): Payer: Self-pay | Admitting: Family Medicine

## 2015-08-14 ENCOUNTER — Encounter: Payer: Self-pay | Admitting: Family Medicine

## 2015-08-14 VITALS — BP 123/85 | HR 87 | Temp 98.3°F | Ht 71.0 in | Wt 254.1 lb

## 2015-08-14 DIAGNOSIS — F1120 Opioid dependence, uncomplicated: Secondary | ICD-10-CM

## 2015-08-14 MED ORDER — TRAMADOL HCL 50 MG PO TABS: 100.0000 mg | ORAL_TABLET | Freq: Three times a day (TID) | ORAL | Status: DC | PRN

## 2015-08-14 NOTE — Assessment & Plan Note (Addendum)
Discussed that I cannot refill his narcotics secondary to ongoing misuse and the fact that he broke his previous pain contract. We will treat his knee pain with tramadol. Recommended complete cessation of all narcotics to prevent withdrawal symptoms. He is not allowing his body to go really any period of time off narcotics and thus whenever he runs out he continues withdrawal symptoms. We'll continue to look for treatment options for him. He is open to Suboxone. FU in 3 weeks to see how search is going.

## 2015-08-14 NOTE — Progress Notes (Signed)
Subjective:    Trevor Thomas is a 49 y.o. male who presents to [facility] today for issues regarding withdrawal:  1.  Narcotic withdrawal: Trevor Thomas has not been doing well since we stopped his narcotics several months ago. He has attempted multiple Suboxone, methadone, drug treatment options [place]. However majority of these require out-of-pocket payment and he isn't able to do this. His pain is "all over" and he has chills and nausea. Evidently he has been still taking Percocet from "friends off the street" and obviously once these run out he begins having chills, nausea, and periods where he cannot get out of bed. His pain is somewhat controlled with tramadol. He is asking to restart narcotic contract today.   ROS as above per HPI, otherwise neg.    The following portions of the patient's history were reviewed and updated as appropriate: allergies, current medications, past medical history, family and social history, and problem list. Patient is a current every day smoker.    PMH reviewed.  Past Medical History  Diagnosis Date  . Hypertension   . COPD (chronic obstructive pulmonary disease) (HCC)   . Bronchitis     hx of  . Asthma   . Concussion 1983  . Headache(784.0)   . Anxiety   . Depression   . Umbilical hernia   . GERD (gastroesophageal reflux disease)     takes tums/rolaids  . Arthritis   . Head trauma   . 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 Knee surgery    . Thumb surgery (left)    . Umbilical hernia repair  05/15/2012    Procedure: HERNIA REPAIR UMBILICAL ADULT;  Surgeon: Clovis Pu. Cornett, MD;  Location: [facility] OR;  Service: General;  Laterality: N/A;  . Hernia repair  05/2012    umbilical hernia  . Eye surgery  20 years ago    Metal foreign body removed from Right eye  . Esophagogastroduodenoscopy N/A 08/25/2014    Procedure: ESOPHAGOGASTRODUODENOSCOPY (EGD);   Surgeon: Louis Meckel, MD;  Location: [facility] ENDOSCOPY;  Service: Endoscopy;  Laterality: N/A;    Medications reviewed. Current Outpatient Prescriptions  Medication Sig Dispense Refill  . albuterol (PROVENTIL HFA;VENTOLIN HFA) 108 (90 BASE) MCG/ACT inhaler Inhale 2 puffs into the lungs every 6 (six) hours as needed for wheezing. 8 g 1  . cephALEXin (KEFLEX) 500 MG capsule Take 1 capsule (500 mg total) by mouth 4 (four) times daily. (Patient not taking: Reported on 06/30/2015) 40 capsule 0  . citalopram (CELEXA) 40 MG tablet Take 1 tablet (40 mg total) by mouth daily. 90 tablet 1  . lisinopril-hydrochlorothiazide (PRINZIDE,ZESTORETIC) 20-25 MG tablet Take 1 tablet by mouth every morning. 90 tablet 1  . loratadine (CLARITIN) 10 MG tablet Take 1 tablet (10 mg total) by mouth every morning. 30 tablet 11  . LORazepam (ATIVAN) 0.5 MG tablet TAKE ONE TABLET BY MOUTH AT BEDTIME AS NEEDED FOR ANXIETY AND SLEEP 30 tablet 2  . naproxen (NAPROSYN) 500 MG tablet Take 1 tablet (500 mg total) by mouth 2 (two) times daily. (Patient not taking: Reported on 06/30/2015) 30 tablet 0  . oxyCODONE-acetaminophen (PERCOCET/ROXICET) 5-325 MG per tablet Take 1 tablet by mouth every 4 (four) hours as needed for severe pain. (Patient not taking: Reported on 06/30/2015) 12 tablet 0  . predniSONE (DELTASONE) 20 MG tablet Take 2 tablets (40 mg total) by mouth  daily with breakfast. (Patient not taking: Reported on 06/30/2015) 10 tablet 0  . traMADol (ULTRAM) 50 MG tablet Take 2 tablets (100 mg total) by mouth every 8 (eight) hours as needed. 90 tablet 0   No current facility-administered medications for this visit.     Objective:   Physical Exam BP 123/85 mmHg  Pulse 87  Temp(Src) 98.3 F (36.8 C) (Oral)  Ht  (1.803 m)  Wt 254 lb 1.6 oz (115.259 kg)  BMI 35.46 kg/m2 Gen:  Alert, cooperative patient who appears stated age in no acute distress.  Vital signs reviewed.  Disheveled appearing. Hair is longer than usual.  Does not appear to be taken care of himself. Psych: Linear and coherent thought process. No hallucinations. No suicidal ideation.  No results found for this or any previous visit (from the past 72 hour(s)).

## 2015-08-14 NOTE — Patient Instructions (Signed)
It was good to see you again today.  This is a tough process.  Keep taking care of yourself as much as you can.  Come back and see me after Thanksgiving.

## 2015-09-04 ENCOUNTER — Other Ambulatory Visit: Payer: Self-pay | Admitting: Family Medicine

## 2015-09-09 ENCOUNTER — Other Ambulatory Visit: Payer: Self-pay | Admitting: Family Medicine

## 2015-09-09 MED ORDER — LORAZEPAM 0.5 MG PO TABS: ORAL_TABLET | ORAL | Status: DC

## 2015-09-09 MED ORDER — LORATADINE 10 MG PO TABS: 10.0000 mg | ORAL_TABLET | Freq: Every morning | ORAL | Status: DC

## 2015-09-09 NOTE — Telephone Encounter (Signed)
Needs refill on Tramadol, will forward to PCP.

## 2015-09-09 NOTE — Telephone Encounter (Signed)
Pt forgot to request his lorazepam and loratadine earlier when he called, goes to [facility] [address] [facility] [address]

## 2015-09-09 NOTE — Telephone Encounter (Signed)
Will route request to Dr. Gwendolyn Grant.  Altamese Dilling, BSN, RN-BC

## 2015-09-09 NOTE — Telephone Encounter (Signed)
Pt is requesting refill on tramadol, also wants the doctor to know he has stopped all opiates as of 14 days ago, only needs the tramadol for his restless legs. Pt goes to [facility]/[address]

## 2015-09-17 ENCOUNTER — Ambulatory Visit: Payer: Self-pay

## 2015-10-16 ENCOUNTER — Other Ambulatory Visit: Payer: Self-pay | Admitting: Family Medicine

## 2015-10-20 NOTE — Telephone Encounter (Signed)
2nd request.  Martin, Tamika L, RN  

## 2015-10-21 ENCOUNTER — Other Ambulatory Visit: Payer: Self-pay | Admitting: Family Medicine

## 2015-10-22 ENCOUNTER — Other Ambulatory Visit: Payer: Self-pay | Admitting: *Deleted

## 2015-10-22 NOTE — Telephone Encounter (Signed)
Pt calling and states that the pharmacy informed him that they have not received the fax for this rx. Sadie Reynolds, ASA

## 2015-10-22 NOTE — Telephone Encounter (Signed)
Rx called into [facility]. traMADol (ULTRAM) 50 MG tablet:TAKE TWO TABLETS BY MOUTH EVERY 6 HOURS AS NEEDED, #90, 1 refill. Printed Rx placed in shred box.  Clovis Pu, RN

## 2015-10-23 MED ORDER — LORAZEPAM 0.5 MG PO TABS: ORAL_TABLET | ORAL | Status: DC

## 2015-11-10 ENCOUNTER — Ambulatory Visit: Payer: Self-pay | Admitting: Family Medicine

## 2015-11-30 ENCOUNTER — Other Ambulatory Visit: Payer: Self-pay | Admitting: Family Medicine

## 2015-11-30 NOTE — Telephone Encounter (Signed)
Needs refill on tramadol  °

## 2015-12-01 ENCOUNTER — Other Ambulatory Visit: Payer: Self-pay | Admitting: Family Medicine

## 2015-12-11 ENCOUNTER — Ambulatory Visit: Payer: Self-pay | Admitting: Family Medicine

## 2016-01-14 ENCOUNTER — Encounter (HOSPITAL_COMMUNITY): Payer: Self-pay | Admitting: Emergency Medicine

## 2016-01-14 ENCOUNTER — Ambulatory Visit (HOSPITAL_COMMUNITY)
Admission: EM | Admit: 2016-01-14 | Discharge: 2016-01-14 | Disposition: A | Discharge status: Home / Self Care | Payer: No Typology Code available for payment source

## 2016-01-14 NOTE — ED Notes (Signed)
Patient complains of neck pain, reports inability to move neck.  Symptoms for a week.  Patient denied any falls or injury.  Patient reports unable to move neck.  Movement of neck is painful and "sounds like something is cracking".  Asked if any different feeling in either arm or hand and patient denied any issues with upper extremities.  When asked about legs patient replied he has restless legs syndrome, but no new weakness.   Asked patient about dizziness complaint.  Patient said he had been dozing off all day and felt " dizzy when trying to wake up"  Asked patient to explain and patient turned head to the left to look at significant other and said "do i still have to tell a doctor? ..what the fuck, how many people do I have to talk to?"  Myself and significant other tried to explain that I am the nurse, not the first person he talked to on arrival.  Patient moved head with less stiffness and jumped from exam table and walked out of building.

## 2016-01-27 ENCOUNTER — Encounter (HOSPITAL_BASED_OUTPATIENT_CLINIC_OR_DEPARTMENT_OTHER): Payer: Self-pay | Admitting: *Deleted

## 2016-01-27 ENCOUNTER — Emergency Department (HOSPITAL_BASED_OUTPATIENT_CLINIC_OR_DEPARTMENT_OTHER)
Admission: EM | Admit: 2016-01-27 | Discharge: 2016-01-28 | Disposition: A | Discharge status: Home / Self Care | Payer: Self-pay | Attending: Emergency Medicine | Admitting: Emergency Medicine

## 2016-01-27 ENCOUNTER — Emergency Department (HOSPITAL_BASED_OUTPATIENT_CLINIC_OR_DEPARTMENT_OTHER): Payer: Self-pay

## 2016-01-27 DIAGNOSIS — J45909 Unspecified asthma, uncomplicated: Secondary | ICD-10-CM | POA: Insufficient documentation

## 2016-01-27 DIAGNOSIS — R51 Headache: Secondary | ICD-10-CM | POA: Insufficient documentation

## 2016-01-27 DIAGNOSIS — E876 Hypokalemia: Secondary | ICD-10-CM

## 2016-01-27 DIAGNOSIS — M1990 Unspecified osteoarthritis, unspecified site: Secondary | ICD-10-CM | POA: Insufficient documentation

## 2016-01-27 DIAGNOSIS — F329 Major depressive disorder, single episode, unspecified: Secondary | ICD-10-CM | POA: Insufficient documentation

## 2016-01-27 DIAGNOSIS — I10 Essential (primary) hypertension: Secondary | ICD-10-CM | POA: Insufficient documentation

## 2016-01-27 DIAGNOSIS — Z79899 Other long term (current) drug therapy: Secondary | ICD-10-CM | POA: Insufficient documentation

## 2016-01-27 DIAGNOSIS — J449 Chronic obstructive pulmonary disease, unspecified: Secondary | ICD-10-CM | POA: Insufficient documentation

## 2016-01-27 DIAGNOSIS — M5030 Other cervical disc degeneration, unspecified cervical region: Secondary | ICD-10-CM

## 2016-01-27 DIAGNOSIS — F17210 Nicotine dependence, cigarettes, uncomplicated: Secondary | ICD-10-CM | POA: Insufficient documentation

## 2016-01-27 LAB — BASIC METABOLIC PANEL
Anion gap: 4 — ABNORMAL LOW (ref 5–15)
BUN: 9 mg/dL (ref 6–20)
CHLORIDE: 105 mmol/L (ref 101–111)
CO2: 27 mmol/L (ref 22–32)
CREATININE: 0.68 mg/dL (ref 0.61–1.24)
Calcium: 8.6 mg/dL — ABNORMAL LOW (ref 8.9–10.3)
Glucose, Bld: 109 mg/dL — ABNORMAL HIGH (ref 65–99)
Potassium: 3.2 mmol/L — ABNORMAL LOW (ref 3.5–5.1)
SODIUM: 136 mmol/L (ref 135–145)

## 2016-01-27 LAB — TROPONIN I

## 2016-01-27 MED ORDER — POTASSIUM CHLORIDE CRYS ER 20 MEQ PO TBCR
40.0000 meq | EXTENDED_RELEASE_TABLET | Freq: Once | ORAL | Status: AC
  Administered 2016-01-27: 40 meq via ORAL
  Filled 2016-01-27: qty 2

## 2016-01-27 MED ORDER — CYCLOBENZAPRINE HCL 10 MG PO TABS
10.0000 mg | ORAL_TABLET | Freq: Once | ORAL | Status: AC
Start: 2016-01-27 — End: 2016-01-27
  Administered 2016-01-27: 10 mg via ORAL
  Filled 2016-01-27: qty 1

## 2016-01-27 MED ORDER — KETOROLAC TROMETHAMINE 10 MG PO TABS
10.0000 mg | ORAL_TABLET | Freq: Once | ORAL | Status: AC
  Administered 2016-01-27: 10 mg via ORAL
  Filled 2016-01-27: qty 1

## 2016-01-27 NOTE — ED Notes (Addendum)
Pt c/o neck pain x 2 weeks. Denies neck injury

## 2016-01-27 NOTE — ED Provider Notes (Signed)
CSN: [id number]     Arrival date & time 01/27/16  1935 History   First MD Initiated Contact with Patient 01/27/16 2210     Chief Complaint  Patient presents with  . Neck Pain     (Consider location/radiation/quality/duration/timing/severity/associated sxs/prior Treatment) HPI Comments: Patient is a 50 year old male who presents to the emergency department with complaint of neck pain for 2 weeks.  The patient presents to the emergency department with his significant other. The significant other reports that the patient's pain is getting progressively worse. She states that when he turns his head in certain ways he feels as though he has is when cut off. She further states that he has times of drowsiness as though he is going to pass out and she has to be tapped or shaken to wake up. She reports that he is been having headaches, and the family is very concerned. She is seen the primary physician, and she states she is not getting the answers that she feels like she needs concerning his condition. The patient states he feels like he has had some low-grade fevers, he has not had vomiting or diarrhea reported. He's not had any loss of control of bowel or bladder. He states that at times his gait feels a little bit "wobbly". He has not had excessive falls reported.  Patient is a 50 y.o. male presenting with neck pain. The history is provided by the patient.  Neck Pain Associated symptoms: headaches     Past Medical History  Diagnosis Date  . Hypertension   . COPD (chronic obstructive pulmonary disease) (HCC)   . Bronchitis     hx of  . Asthma   . Concussion 1983  . Headache(784.0)   . Anxiety   . Depression   . Umbilical hernia   . GERD (gastroesophageal reflux disease)     takes tums/rolaids  . Arthritis   . Head trauma   . 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 Knee surgery    . Thumb surgery  (left)    . Umbilical hernia repair  05/15/2012    Procedure: HERNIA REPAIR UMBILICAL ADULT;  Surgeon: Clovis Pu. Cornett, MD;  Location: [facility] OR;  Service: General;  Laterality: N/A;  . Hernia repair  05/2012    umbilical hernia  . Eye surgery  20 years ago    Metal foreign body removed from Right eye  . Esophagogastroduodenoscopy N/A 08/25/2014    Procedure: ESOPHAGOGASTRODUODENOSCOPY (EGD);  Surgeon: Louis Meckel, MD;  Location: [facility] ENDOSCOPY;  Service: Endoscopy;  Laterality: N/A;   Family History  Problem Relation Age of Onset  . Diabetes Mother   . Diabetes Father   . Heart failure Brother    Social History  Substance Use Topics  . Smoking status: Current Every Day Smoker -- 1.00 packs/day for 20 years    Types: Cigarettes  . Smokeless tobacco: Never Used  . Alcohol Use: No     Comment: rarely  - quit approx. June 2013    Review of Systems  Musculoskeletal: Positive for neck pain.  Neurological: Positive for light-headedness and headaches.  Psychiatric/Behavioral: The patient is nervous/anxious.   All other systems reviewed and are negative.     Allergies  Bee venom and Clonidine derivatives  Home Medications   Prior to Admission medications   Medication Sig Start Date End Date Taking? Authorizing Provider  albuterol (  PROVENTIL HFA;VENTOLIN HFA) 108 (90 BASE) MCG/ACT inhaler Inhale 2 puffs into the lungs every 6 (six) hours as needed for wheezing. 09/02/14   Tobey Grim, MD  citalopram (CELEXA) 40 MG tablet Take 1 tablet (40 mg total) by mouth daily. 08/04/15   Tobey Grim, MD  lisinopril-hydrochlorothiazide (PRINZIDE,ZESTORETIC) 20-25 MG tablet Take 1 tablet by mouth every morning. 08/04/15   Tobey Grim, MD  loratadine (CLARITIN) 10 MG tablet Take 1 tablet (10 mg total) by mouth every morning. 09/09/15   Tobey Grim, MD  LORazepam (ATIVAN) 0.5 MG tablet TAKE ONE TABLET BY MOUTH AT BEDTIME AS NEEDED FOR ANXIETY AND SLEEP 10/23/15   Tobey Grim, MD   naproxen (NAPROSYN) 500 MG tablet Take 1 tablet (500 mg total) by mouth 2 (two) times daily. Patient not taking: Reported on 06/30/2015 05/21/15   [name], MD  oxyCODONE-acetaminophen (PERCOCET/ROXICET) 5-325 MG per tablet Take 1 tablet by mouth every 4 (four) hours as needed for severe pain. Patient not taking: Reported on 06/30/2015 05/21/15   [name], MD  traMADol (ULTRAM) 50 MG tablet TAKE TWO TABLETS BY MOUTH EVERY 6 HOURS AS NEEDED 12/01/15   Tobey Grim, MD   BP 128/84 mmHg  Pulse 70  Temp(Src) 99.1 F (37.3 C)  Resp 18  Ht 5\' 11"  (1.803 m)  Wt 104.327 kg  BMI 32.09 kg/m2  SpO2 96% Physical Exam  Constitutional: He is oriented to person, place, and time. He appears well-developed and well-nourished.  Non-toxic appearance.  HENT:  Head: Normocephalic.  Right Ear: Tympanic membrane and external ear normal.  Left Ear: Tympanic membrane and external ear normal.  Eyes: EOM and lids are normal. Pupils are equal, round, and reactive to light.  Neck: Normal range of motion. Neck supple. Carotid bruit is not present. No tracheal deviation present.  Cardiovascular: Normal rate, regular rhythm, normal heart sounds, intact distal pulses and normal pulses.   Pulmonary/Chest: Breath sounds normal. No respiratory distress.  Abdominal: Soft. Bowel sounds are normal. There is no tenderness. There is no guarding.  Musculoskeletal: Normal range of motion.  Trapezius tenderness on the right and the left.  There is soreness with range of motion of the neck, there is no palpable step off of the cervical spine, thoracic spine, or lumbar spine.  Lymphadenopathy:       Head (right side): No submandibular adenopathy present.       Head (left side): No submandibular adenopathy present.    He has no cervical adenopathy.  Neurological: He is alert and oriented to person, place, and time. He has normal strength. No cranial nerve deficit or sensory deficit.  Gait is steady. Speech is  clear. There is no foot drop appreciated. There is no motor weakness of the upper or lower extremity. There is no sensory deficit of the upper or lower extremity.  Skin: Skin is warm and dry.  Psychiatric: He has a normal mood and affect. His speech is normal.  Nursing note and vitals reviewed.   ED Course  Procedures (including critical care time) Labs Review Labs Reviewed  BASIC METABOLIC PANEL - Abnormal; Notable for the following:    Potassium 3.2 (*)    Glucose, Bld 109 (*)    Calcium 8.6 (*)    Anion gap 4 (*)    All other components within normal limits  TROPONIN I    Imaging Review Ct Head Wo Contrast  01/27/2016  CLINICAL DATA:  Neck pain and near syncope. EXAM:  CT HEAD WITHOUT CONTRAST CT CERVICAL SPINE WITHOUT CONTRAST TECHNIQUE: Multidetector CT imaging of the head and cervical spine was performed following the standard protocol without intravenous contrast. Multiplanar CT image reconstructions of the cervical spine were also generated. COMPARISON:  03/08/2015 FINDINGS: CT HEAD FINDINGS Skull and Sinuses:Negative for fracture or destructive process. Tiny, incidental left parietal bone osteoma. The visualized mastoids, middle ears, and imaged paranasal sinuses are clear. Visualized orbits: Negative. Brain: Negative. No evidence of acute infarction, hemorrhage, hydrocephalus, or mass lesion/mass effect. CT CERVICAL SPINE FINDINGS No acute finding including fracture, subluxation, or endplate erosion. No evidence of focal bone lesion. Disc narrowing and endplate ridging in the lower cervical spine. Focal notable right facet arthropathy at C3-4 with spurring and overgrowth causing high-grade right foraminal stenosis. No evidence of significant canal stenosis. IMPRESSION: 1. No acute finding or change since 2016. 2. 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 Electronically Signed   By: Marnee Spring M.D.   On: 01/27/2016 23:31   Ct Cervical Spine Wo  Contrast  01/27/2016  CLINICAL DATA:  Neck pain and near syncope. EXAM: CT HEAD WITHOUT CONTRAST CT CERVICAL SPINE WITHOUT CONTRAST TECHNIQUE: Multidetector CT imaging of the head and cervical spine was performed following the standard protocol without intravenous contrast. Multiplanar CT image reconstructions of the cervical spine were also generated. COMPARISON:  03/08/2015 FINDINGS: CT HEAD FINDINGS Skull and Sinuses:Negative for fracture or destructive process. Tiny, incidental left parietal bone osteoma. The visualized mastoids, middle ears, and imaged paranasal sinuses are clear. Visualized orbits: Negative. Brain: Negative. No evidence of acute infarction, hemorrhage, hydrocephalus, or mass lesion/mass effect. CT CERVICAL SPINE FINDINGS No acute finding including fracture, subluxation, or endplate erosion. No evidence of focal bone lesion. Disc narrowing and endplate ridging in the lower cervical spine. Focal notable right facet arthropathy at C3-4 with spurring and overgrowth causing high-grade right foraminal stenosis. No evidence of significant canal stenosis. IMPRESSION: 1. No acute finding or change since 2016. 2. 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 Electronically Signed   By: Marnee Spring M.D.   On: 01/27/2016 23:31   I have personally reviewed and evaluated these images and lab results as part of my medical decision-making.   EKG Interpretation None      MDM  CT head scan is negative for acute findings. The CT of the cervical spine reveals spurring at the C3-C4 area. There is also some foraminal stenosis on the right. The patient is advised to see the primary physician concerning the symptoms, patient was reassured that no acute changes were noted on 2 nights exam, but there was exacerbation of findings that were noted in 2016. The patient and the significant other acknowledge understanding of the findings and the need for continued follow-up by primary  physician. Prescription for Flexeril and diclofenac given to the patient.    Final diagnoses:  None    **I have reviewed nursing notes, vital signs, and all appropriate lab and imaging results for this patient.Ivery Quale, PA-C 01/27/16 2357  Cathren Laine, MD 01/28/16 0000

## 2016-01-28 ENCOUNTER — Telehealth: Payer: Self-pay | Admitting: *Deleted

## 2016-01-28 DIAGNOSIS — M5489 Other dorsalgia: Secondary | ICD-10-CM

## 2016-01-28 MED ORDER — CYCLOBENZAPRINE HCL 10 MG PO TABS: 10.0000 mg | ORAL_TABLET | Freq: Two times a day (BID) | ORAL | Status: DC | PRN

## 2016-01-28 MED ORDER — DICLOFENAC SODIUM 75 MG PO TBEC: 75.0000 mg | DELAYED_RELEASE_TABLET | Freq: Two times a day (BID) | ORAL | Status: DC

## 2016-01-28 NOTE — Telephone Encounter (Signed)
Pt notified. Sharon T Saunders, CMA   

## 2016-01-28 NOTE — Discharge Instructions (Signed)
Your potassium is slightly low, please increase leafy green salads and your bananas. The CT scan of your head is negative for acute problem. X-ray of your cervical spine reveals degenerative joint disease or osteoarthritis. There is also some degenerative disc disease changes there. Please discuss these as well as the sensations of weakness and near Degenerative Disk Disease Degenerative disk disease is a condition caused by the changes that occur in spinal disks as you grow older. Spinal disks are soft and compressible disks located between the bones of your spine (vertebrae). These disks act like shock absorbers. Degenerative disk disease can affect the whole spine. However, the neck and lower back are most commonly affected. Many changes can occur in the spinal disks with aging, such as:  The spinal disks may dry and shrink.  Small tears may occur in the tough, outer covering of the disk (annulus).  The disk space may become smaller due to loss of water.  Abnormal growths in the bone (spurs) may occur. This can put pressure on the nerve roots exiting the spinal canal, causing pain.  The spinal canal may become narrowed. RISK FACTORS   Being overweight.  Having a family history of degenerative disk disease.  Smoking.  There is increased risk if you are doing heavy lifting or have a sudden injury. SIGNS AND SYMPTOMS  Symptoms vary from person to person and may include:  Pain that varies in intensity. Some people have no pain, while others have severe pain. The location of the pain depends on the part of your backbone that is affected.  You will have neck or arm pain if a disk in the neck area is affected.  You will have pain in your back, buttocks, or legs if a disk in the lower back is affected.  Pain that becomes worse while bending, reaching up, or with twisting movements.  Pain that may start gradually and then get worse as time passes. It may also start after a major or minor  injury.  Numbness or tingling in the arms or legs. DIAGNOSIS  Your health care provider will ask you about your symptoms and about activities or habits that may cause the pain. He or she may also ask about any injuries, diseases, or treatments you have had. Your health care provider will examine you to check for the range of movement that is possible in the affected area, to check for strength in your extremities, and to check for sensation in the areas of the arms and legs supplied by different nerve roots. You may also have:   An X-ray of the spine.  Other imaging tests, such as MRI. TREATMENT  Your health care provider will advise you on the best plan for treatment. Treatment may include:  Medicines.  Rehabilitation exercises. HOME CARE INSTRUCTIONS   Follow proper lifting and walking techniques as advised by your health care provider.  Maintain good posture.  Exercise regularly as advised by your health care provider.  Perform relaxation exercises.  Change your sitting, standing, and sleeping habits as advised by your health care provider.  Change positions frequently.  Lose weight or maintain a healthy weight as advised by your health care provider.  Do not use any tobacco products, including cigarettes, chewing tobacco, or electronic cigarettes. If you need help quitting, ask your health care provider.  Wear supportive footwear.  Take medicines only as directed by your health care provider. SEEK MEDICAL CARE IF:   Your pain does not go away within 1-4  weeks.  You have significant appetite or weight loss. SEEK IMMEDIATE MEDICAL CARE IF:   Your pain is severe.  You notice weakness in your arms, hands, or legs.  You begin to lose control of your bladder or bowel movements.  You have fevers or night sweats. MAKE SURE YOU:   Understand these instructions.  Will watch your condition.  Will get help right away if you are not doing well or get worse.   This  information is not intended to replace advice given to you by your health care provider. Make sure you discuss any questions you have with your health care provider.   Document Released: 07/17/2007 Document Revised: 10/10/2014 Document Reviewed: 01/21/2014 Elsevier Interactive Patient Education 2016 [employer].  Hypokalemia Hypokalemia means that the amount of potassium in the blood is lower than normal.Potassium is a chemical, called an electrolyte, that helps regulate the amount of fluid in the body. It also stimulates muscle contraction and helps nerves function properly.Most of the body's potassium is inside of cells, and only a very small amount is in the blood. Because the amount in the blood is so small, minor changes can be life-threatening. CAUSES  Antibiotics.  Diarrhea or vomiting.  Using laxatives too much, which can cause diarrhea.  Chronic kidney disease.  Water pills (diuretics).  Eating disorders (bulimia).  Low magnesium level.  Sweating a lot. SIGNS AND SYMPTOMS  Weakness.  Constipation.  Fatigue.  Muscle cramps.  Mental confusion.  Skipped heartbeats or irregular heartbeat (palpitations).  Tingling or numbness. DIAGNOSIS  Your health care provider can diagnose hypokalemia with blood tests. In addition to checking your potassium level, your health care provider may also check other lab tests. TREATMENT Hypokalemia can be treated with potassium supplements taken by mouth or adjustments in your current medicines. If your potassium level is very low, you may need to get potassium through a vein (IV) and be monitored in the hospital. A diet high in potassium is also helpful. Foods high in potassium are:  Nuts, such as peanuts and pistachios.  Seeds, such as sunflower seeds and pumpkin seeds.  Peas, lentils, and lima beans.  Whole grain and bran cereals and breads.  Fresh fruit and vegetables, such as apricots, avocado, bananas, cantaloupe, kiwi,  oranges, tomatoes, asparagus, and potatoes.  Orange and tomato juices.  Red meats.  Fruit yogurt. HOME CARE INSTRUCTIONS  Take all medicines as prescribed by your health care provider.  Maintain a healthy diet by including nutritious food, such as fruits, vegetables, nuts, whole grains, and lean meats.  If you are taking a laxative, be sure to follow the directions on the label. SEEK MEDICAL CARE IF:  Your weakness gets worse.  You feel your heart pounding or racing.  You are vomiting or having diarrhea.  You are diabetic and having trouble keeping your blood glucose in the normal range. SEEK IMMEDIATE MEDICAL CARE IF:  You have chest pain, shortness of breath, or dizziness.  You are vomiting or having diarrhea for more than 2 days.  You faint. MAKE SURE YOU:   Understand these instructions.  Will watch your condition.  Will get help right away if you are not doing well or get worse.   This information is not intended to replace advice given to you by your health care provider. Make sure you discuss any questions you have with your health care provider.   Document Released: 09/19/2005 Document Revised: 10/10/2014 Document Reviewed: 03/22/2013 Elsevier Interactive Patient Education [employer]  syncope to with your doctor.

## 2016-01-28 NOTE — Telephone Encounter (Signed)
Patient was seen in ED yesterday and CT showed his back was getting worse and it was recommended he see an orthopedic surgeon. Patient would like to be referred to ortho to discuss options for surgery.

## 2016-01-28 NOTE — Telephone Encounter (Signed)
I will place a referral.  However I'm not sure he has insurance.  Please call and let patient know that a) referral has been placed and b) he may have to pay out of pocket if he has no insurance.

## 2016-02-18 ENCOUNTER — Other Ambulatory Visit: Payer: Self-pay | Admitting: Family Medicine

## 2016-02-23 ENCOUNTER — Ambulatory Visit (INDEPENDENT_AMBULATORY_CARE_PROVIDER_SITE_OTHER): Payer: No Typology Code available for payment source | Admitting: Family Medicine

## 2016-02-23 ENCOUNTER — Encounter: Payer: Self-pay | Admitting: Family Medicine

## 2016-02-23 VITALS — BP 137/93 | HR 79 | Temp 98.1°F | Wt 235.0 lb

## 2016-02-23 DIAGNOSIS — G8929 Other chronic pain: Secondary | ICD-10-CM

## 2016-02-23 DIAGNOSIS — F418 Other specified anxiety disorders: Secondary | ICD-10-CM

## 2016-02-23 DIAGNOSIS — M542 Cervicalgia: Secondary | ICD-10-CM

## 2016-02-23 DIAGNOSIS — F1193 Opioid use, unspecified with withdrawal: Secondary | ICD-10-CM

## 2016-02-23 DIAGNOSIS — F1123 Opioid dependence with withdrawal: Secondary | ICD-10-CM

## 2016-02-23 DIAGNOSIS — E876 Hypokalemia: Secondary | ICD-10-CM

## 2016-02-23 DIAGNOSIS — M549 Dorsalgia, unspecified: Secondary | ICD-10-CM

## 2016-02-23 LAB — BASIC METABOLIC PANEL
BUN: 7 mg/dL (ref 7–25)
CALCIUM: 9.2 mg/dL (ref 8.6–10.3)
CHLORIDE: 104 mmol/L (ref 98–110)
CO2: 27 mmol/L (ref 20–31)
CREATININE: 0.59 mg/dL — AB (ref 0.70–1.33)
Glucose, Bld: 92 mg/dL (ref 65–99)
Potassium: 4.1 mmol/L (ref 3.5–5.3)
SODIUM: 138 mmol/L (ref 135–146)

## 2016-02-23 MED ORDER — LORAZEPAM 0.5 MG PO TABS: ORAL_TABLET | ORAL | Status: DC

## 2016-02-23 MED ORDER — CITALOPRAM HYDROBROMIDE 40 MG PO TABS: 40.0000 mg | ORAL_TABLET | Freq: Every day | ORAL | Status: DC

## 2016-02-23 MED ORDER — METHOCARBAMOL 500 MG PO TABS: 500.0000 mg | ORAL_TABLET | Freq: Two times a day (BID) | ORAL | Status: DC | PRN

## 2016-02-23 MED ORDER — TRAMADOL HCL 50 MG PO TABS: ORAL_TABLET | ORAL | Status: DC

## 2016-02-23 NOTE — Assessment & Plan Note (Addendum)
Now with neck pain and stiffness.  Worse with head movement. Has fairly noticeable and progressive foraminal pain.  Patient desires surgical intervention.  Will place referral with [facility] as he has the [facility] card.  Robaxin to treat stiffness.  Tramadol to treat pain.

## 2016-02-23 NOTE — Assessment & Plan Note (Signed)
Worsened since neck pain has increased.  Continue celexa and lorazepam.

## 2016-02-23 NOTE — Patient Instructions (Signed)
I am referring you to [facility].  They'll be calling you with a number.  Take the Robaxin as a muscle relaxer.  We've started at a medium dose.  Give this a few days, and call if it's not helping as much.  It was good to see you again today

## 2016-02-23 NOTE — Assessment & Plan Note (Signed)
Asking for stronger pain medication.  Discussed cannot prescribe here.  Continue tramadol.

## 2016-02-23 NOTE — Progress Notes (Signed)
Subjective:    Trevor Thomas is a 50 y.o. male who presents to [facility] today for FU for neck pain:  1.  Neck pain:  Patient seen in ED about 1 month ago for worsening back and neck pain.  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  Otehrwise no canal stenosis or other findings since 2016.  Negative CT of head.  Since being seen in ED, pain has persisted.  Occasionally with inabilty to turn head or neck due to neck stiffness and pain.  No real injury, just gradual increases in pain.  With numbness and tingling Right arm and hand.    ROS as above per HPI, otherwise neg.    The following portions of the patient's history were reviewed and updated as appropriate: allergies, current medications, past medical history, family and social history, and problem list. Patient is a nonsmoker.    PMH reviewed.     Medications reviewed. Current Outpatient Prescriptions  Medication Sig Dispense Refill  . albuterol (PROVENTIL HFA;VENTOLIN HFA) 108 (90 BASE) MCG/ACT inhaler Inhale 2 puffs into the lungs every 6 (six) hours as needed for wheezing. 8 g 1  . citalopram (CELEXA) 40 MG tablet TAKE ONE TABLET BY MOUTH ONCE DAILY 90 tablet 0  . cyclobenzaprine (FLEXERIL) 10 MG tablet Take 1 tablet (10 mg total) by mouth 2 (two) times daily as needed for muscle spasms. 14 tablet 0  . diclofenac (VOLTAREN) 75 MG EC tablet Take 1 tablet (75 mg total) by mouth 2 (two) times daily. 14 tablet 0  . lisinopril-hydrochlorothiazide (PRINZIDE,ZESTORETIC) 20-25 MG tablet Take 1 tablet by mouth every morning. 90 tablet 1  . loratadine (CLARITIN) 10 MG tablet Take 1 tablet (10 mg total) by mouth every morning. 30 tablet 11  . LORazepam (ATIVAN) 0.5 MG tablet TAKE ONE TABLET BY MOUTH AT BEDTIME AS NEEDED FOR ANXIETY 30 tablet 0  . naproxen (NAPROSYN) 500 MG tablet Take 1 tablet (500 mg total) by mouth 2 (two) times daily. (Patient not taking: Reported on 06/30/2015) 30  tablet 0  . oxyCODONE-acetaminophen (PERCOCET/ROXICET) 5-325 MG per tablet Take 1 tablet by mouth every 4 (four) hours as needed for severe pain. (Patient not taking: Reported on 06/30/2015) 12 tablet 0  . traMADol (ULTRAM) 50 MG tablet TAKE TWO TABLETS BY MOUTH EVERY 6 HOURS AS NEEDED 90 tablet 0   No current facility-administered medications for this visit.     Objective:   Physical Exam BP 137/93 mmHg  Pulse 79  Temp(Src) 98.1 F (36.7 C) (Oral)  Wt 235 lb (106.595 kg) Gen:  Alert, cooperative patient who appears stated age in no acute distress.  Vital signs reviewed. MSK:  Spurlings positive.  Decreased forward flexion and neck extension.  Can turn head about 30 degrees left and right. Psych:  Somewhat anxious appearing     No results found for this or any previous visit (from the past 72 hour(s)).

## 2016-02-24 LAB — HIV ANTIBODY (ROUTINE TESTING W REFLEX): HIV 1&2 Ab, 4th Generation: NONREACTIVE

## 2016-03-06 ENCOUNTER — Emergency Department (HOSPITAL_COMMUNITY)
Admission: EM | Admit: 2016-03-06 | Discharge: 2016-03-07 | Disposition: A | Discharge status: Home / Self Care | Payer: No Typology Code available for payment source | Attending: Emergency Medicine | Admitting: Emergency Medicine

## 2016-03-06 ENCOUNTER — Encounter (HOSPITAL_COMMUNITY): Payer: Self-pay | Admitting: Oncology

## 2016-03-06 DIAGNOSIS — Y9389 Activity, other specified: Secondary | ICD-10-CM | POA: Insufficient documentation

## 2016-03-06 DIAGNOSIS — I10 Essential (primary) hypertension: Secondary | ICD-10-CM | POA: Insufficient documentation

## 2016-03-06 DIAGNOSIS — Z791 Long term (current) use of non-steroidal anti-inflammatories (NSAID): Secondary | ICD-10-CM | POA: Insufficient documentation

## 2016-03-06 DIAGNOSIS — J449 Chronic obstructive pulmonary disease, unspecified: Secondary | ICD-10-CM | POA: Insufficient documentation

## 2016-03-06 DIAGNOSIS — W260XXA Contact with knife, initial encounter: Secondary | ICD-10-CM | POA: Insufficient documentation

## 2016-03-06 DIAGNOSIS — Y999 Unspecified external cause status: Secondary | ICD-10-CM | POA: Insufficient documentation

## 2016-03-06 DIAGNOSIS — Y929 Unspecified place or not applicable: Secondary | ICD-10-CM | POA: Insufficient documentation

## 2016-03-06 DIAGNOSIS — F17210 Nicotine dependence, cigarettes, uncomplicated: Secondary | ICD-10-CM | POA: Insufficient documentation

## 2016-03-06 DIAGNOSIS — S61412A Laceration without foreign body of left hand, initial encounter: Secondary | ICD-10-CM

## 2016-03-06 DIAGNOSIS — S61012A Laceration without foreign body of left thumb without damage to nail, initial encounter: Secondary | ICD-10-CM | POA: Insufficient documentation

## 2016-03-06 DIAGNOSIS — Z79899 Other long term (current) drug therapy: Secondary | ICD-10-CM | POA: Insufficient documentation

## 2016-03-06 MED ORDER — ONDANSETRON 4 MG PO TBDP
4.0000 mg | ORAL_TABLET | Freq: Once | ORAL | Status: AC
  Administered 2016-03-06: 4 mg via ORAL
  Filled 2016-03-06: qty 1

## 2016-03-06 MED ORDER — OXYCODONE-ACETAMINOPHEN 5-325 MG PO TABS
1.0000 | ORAL_TABLET | Freq: Once | ORAL | Status: AC
  Administered 2016-03-06: 1 via ORAL
  Filled 2016-03-06: qty 1

## 2016-03-06 MED ORDER — HYDROCODONE-ACETAMINOPHEN 5-325 MG PO TABS: 1.0000 | ORAL_TABLET | ORAL | Status: DC | PRN

## 2016-03-06 MED ORDER — LIDOCAINE-EPINEPHRINE (PF) 2 %-1:200000 IJ SOLN
20.0000 mL | Freq: Once | INTRAMUSCULAR | Status: AC
  Administered 2016-03-07: 20 mL
  Filled 2016-03-06: qty 20

## 2016-03-06 NOTE — Discharge Instructions (Signed)

## 2016-03-06 NOTE — ED Notes (Signed)
PA at bedside.

## 2016-03-06 NOTE — ED Notes (Signed)
Suture cart at bedside 

## 2016-03-06 NOTE — ED Notes (Signed)
Pt has a laceration to his left hand from falling on a knife that he had been using to cut apples with.  Pt denies dizziness prior to fall.  States the fall is d/t chronic neck pain that occasionally makes him fall.

## 2016-03-06 NOTE — ED Provider Notes (Signed)
CSN: [id number]     Arrival date & time 03/06/16  1927 History   First MD Initiated Contact with Patient 03/06/16 2133     Chief Complaint  Patient presents with  . Extremity Laceration     (Consider location/radiation/quality/duration/timing/severity/associated sxs/prior Treatment) HPI   Presents to the emergency department with complaints of laceration to the palm of his left hand. He states that he was using a knife to cut apples with when he lost his balance accidentally cut his hand. He denies being unable to feel or move his thumb. Bleeding is controlled.  Past Medical History  Diagnosis Date  . Hypertension   . COPD (chronic obstructive pulmonary disease) (HCC)   . Bronchitis     hx of  . Asthma   . Concussion 1983  . Headache(784.0)   . Anxiety   . Depression   . Umbilical hernia   . GERD (gastroesophageal reflux disease)     takes tums/rolaids  . Arthritis   . Head trauma   . 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 Knee surgery    . Thumb surgery (left)    . Umbilical hernia repair  05/15/2012    Procedure: HERNIA REPAIR UMBILICAL ADULT;  Surgeon: Clovis Pu. Cornett, MD;  Location: [facility] OR;  Service: General;  Laterality: N/A;  . Hernia repair  05/2012    umbilical hernia  . Eye surgery  20 years ago    Metal foreign body removed from Right eye  . Esophagogastroduodenoscopy N/A 08/25/2014    Procedure: ESOPHAGOGASTRODUODENOSCOPY (EGD);  Surgeon: Louis Meckel, MD;  Location: [facility] ENDOSCOPY;  Service: Endoscopy;  Laterality: N/A;   Family History  Problem Relation Age of Onset  . Diabetes Mother   . Diabetes Father   . Heart failure Brother    Social History  Substance Use Topics  . Smoking status: Current Every Day Smoker -- 1.00 packs/day for 20 years    Types: Cigarettes  . Smokeless tobacco: Never Used  . Alcohol Use: No     Comment: rarely  - quit approx. June 2013      Review of Systems  Review of Systems All other systems negative except as documented in the HPI. All pertinent positives and negatives as reviewed in the HPI.   Allergies  Bee venom and Clonidine derivatives  Home Medications   Prior to Admission medications   Medication Sig Start Date End Date Taking? Authorizing Provider  citalopram (CELEXA) 40 MG tablet Take 1 tablet (40 mg total) by mouth daily. 02/23/16  Yes Tobey Grim, MD  lisinopril-hydrochlorothiazide (PRINZIDE,ZESTORETIC) 20-25 MG tablet Take 1 tablet by mouth every morning. 08/04/15  Yes Tobey Grim, MD  loratadine (CLARITIN) 10 MG tablet Take 1 tablet (10 mg total) by mouth every morning. 09/09/15  Yes Tobey Grim, MD  LORazepam (ATIVAN) 0.5 MG tablet TAKE ONE TABLET BY MOUTH AT BEDTIME AS NEEDED FOR ANXIETY 02/23/16  Yes Tobey Grim, MD  methocarbamol (ROBAXIN) 500 MG tablet Take 1 tablet (500 mg total) by mouth 2 (two) times daily as needed for muscle spasms. 02/23/16  Yes Tobey Grim, MD  traMADol (ULTRAM) 50 MG tablet TAKE TWO TABLETS BY MOUTH EVERY 6 HOURS AS NEEDED Patient taking differently: TAKE TWO TABLETS BY MOUTH EVERY 6 HOURS AS NEEDED 02/23/16  Yes Tobey Grim, MD  albuterol (PROVENTIL HFA;VENTOLIN HFA) 108 (90 BASE)  MCG/ACT inhaler Inhale 2 puffs into the lungs every 6 (six) hours as needed for wheezing. Patient not taking: Reported on 03/06/2016 09/02/14   Tobey Grim, MD  cyclobenzaprine (FLEXERIL) 10 MG tablet Take 1 tablet (10 mg total) by mouth 2 (two) times daily as needed for muscle spasms. 01/27/16   Ivery Quale, PA-C  diclofenac (VOLTAREN) 75 MG EC tablet Take 1 tablet (75 mg total) by mouth 2 (two) times daily. 01/27/16   Ivery Quale, PA-C  HYDROcodone-acetaminophen (NORCO/VICODIN) 5-325 MG tablet Take 1 tablet by mouth every 4 (four) hours as needed. 03/06/16   Joycelynn Fritsche Neva Seat, PA-C   BP 122/87 mmHg  Pulse 97  Temp(Src) 98.9 F (37.2 C) (Oral)  Resp 18  Ht  (1.803  m)  Wt 106.595 kg  BMI 32.79 kg/m2  SpO2 97% Physical Exam  Constitutional: He appears well-developed and well-nourished. No distress.  HENT:  Head: Normocephalic and atraumatic.  Eyes: Pupils are equal, round, and reactive to light.  Neck: Normal range of motion. Neck supple.  Cardiovascular: Normal rate and regular rhythm.   Pulmonary/Chest: Effort normal.  Abdominal: Soft.  Musculoskeletal:       Hands: 5 cm laceration at the base of the left anterior thumb. No musculature or tendons visualized. Wound is clean, edges are smooth. The patient has full range of motion against resistance and passively. Intact sensation and capillary refills less than 2 seconds  Neurological: He is alert.  Skin: Skin is warm and dry.  Nursing note and vitals reviewed.   ED Course  Procedures (including critical care time) Labs Review Labs Reviewed - No data to display  Imaging Review No results found. I have personally reviewed and evaluated these images and lab results as part of my medical decision-making.   EKG Interpretation None     MDM   Final diagnoses:  Hand laceration, left, initial encounter   LACERATION REPAIR Performed by: Dorthula Matas Authorized by: Dorthula Matas Consent: Verbal consent obtained. Risks and benefits: risks, benefits and alternatives were discussed Consent given by: patient Patient identity confirmed: provided demographic data Prepped and Draped in normal sterile fashion Wound explored  Laceration Location: left hand laceration  Laceration Length: 5 cm  No Foreign Bodies seen or palpated  Anesthesia: local infiltration  Local anesthetic: lidocaine 2% with epinephrine  Anesthetic total: 5 ml  Irrigation method: syringe Amount of cleaning: standard  Skin closure: sutures  Number of sutures: 4'0 prolene, 6 sutures  Technique: simple interrupted  Patient tolerance: Patient tolerated the procedure well with no immediate  complications.  Tetanus UTD. Laceration occurred < 12 hours prior to repair. Discussed laceration care with pt and answered questions. Pt to f-u for suture removal in 14 days and wound check sooner should there be signs of dehiscence or infection. Pt is hemodynamically stable with no complaints prior to dc.    Medications  lidocaine-EPINEPHrine (XYLOCAINE W/EPI) 2 %-1:200000 (PF) injection 20 mL (not administered)  oxyCODONE-acetaminophen (PERCOCET/ROXICET) 5-325 MG per tablet 1 tablet (1 tablet Oral Given 03/06/16 2212)  ondansetron (ZOFRAN-ODT) disintegrating tablet 4 mg (4 mg Oral Given 03/06/16 2212)   I discussed results, diagnoses and plan with [facility]. They voice there understanding and questions were answered. We discussed follow-up recommendations and return precautions.    Marlon Pel, PA-C 03/06/16 2349  Doug Sou, MD 03/07/16 [phone]

## 2016-03-07 NOTE — ED Notes (Signed)
Patient d/c'd self care.  F/U and medications discussed.  Patient verbalized understanding. 

## 2016-03-09 ENCOUNTER — Other Ambulatory Visit (HOSPITAL_COMMUNITY): Payer: Self-pay | Admitting: Orthopaedic Surgery

## 2016-03-09 DIAGNOSIS — M545 Low back pain: Secondary | ICD-10-CM

## 2016-03-14 ENCOUNTER — Telehealth: Payer: Self-pay | Admitting: *Deleted

## 2016-03-14 ENCOUNTER — Other Ambulatory Visit (HOSPITAL_COMMUNITY): Payer: Self-pay | Admitting: Orthopaedic Surgery

## 2016-03-14 DIAGNOSIS — M4802 Spinal stenosis, cervical region: Secondary | ICD-10-CM

## 2016-03-14 NOTE — Telephone Encounter (Signed)
Otho Najjar called on behalf of patient.  Patient would like a second opinion for his neck pain.  States that Dr. Roda Shutters is wanting to do a MRI on his back and not of his neck.  They did call the office and ask to have this order changed but haven't heard back from him or his nurse.  I advised patient after speaking with referral coordinator, there are no other options for CFA/GCCN for orthopedic.  Informed wife that patient can seek care on his own with another ortho but it would be out of pocket.  Advised wife to contact Dr. Warren Danes office again and inquire more about this specific imaging and even ask about getting an appt to discuss in person.  She voiced understanding.  I will forward to Md to make him aware. Jazmin Hartsell,CMA

## 2016-03-21 ENCOUNTER — Ambulatory Visit (HOSPITAL_COMMUNITY): Admission: RE | Admit: 2016-03-21 | Payer: No Typology Code available for payment source | Source: Ambulatory Visit

## 2016-03-22 ENCOUNTER — Other Ambulatory Visit (HOSPITAL_COMMUNITY): Payer: Self-pay | Admitting: Orthopaedic Surgery

## 2016-03-22 ENCOUNTER — Ambulatory Visit (HOSPITAL_COMMUNITY)
Admission: RE | Admit: 2016-03-22 | Discharge: 2016-03-22 | Disposition: A | Discharge status: Home / Self Care | Payer: No Typology Code available for payment source | Source: Ambulatory Visit | Attending: Orthopaedic Surgery | Admitting: Orthopaedic Surgery

## 2016-03-22 DIAGNOSIS — M4802 Spinal stenosis, cervical region: Secondary | ICD-10-CM

## 2016-03-24 NOTE — Telephone Encounter (Signed)
Appreciate the recommendation and care.

## 2016-04-04 ENCOUNTER — Other Ambulatory Visit: Payer: Self-pay | Admitting: *Deleted

## 2016-04-04 NOTE — Telephone Encounter (Signed)
Pt needs refill and MRI is tomorrow. Deseree Bruna Potter, CMA

## 2016-04-06 ENCOUNTER — Other Ambulatory Visit (HOSPITAL_COMMUNITY): Payer: Self-pay | Admitting: Orthopaedic Surgery

## 2016-04-06 ENCOUNTER — Ambulatory Visit (HOSPITAL_COMMUNITY)
Admission: RE | Admit: 2016-04-06 | Discharge: 2016-04-06 | Disposition: A | Discharge status: Home / Self Care | Payer: No Typology Code available for payment source | Source: Ambulatory Visit | Attending: Orthopaedic Surgery | Admitting: Orthopaedic Surgery

## 2016-04-06 ENCOUNTER — Ambulatory Visit (HOSPITAL_COMMUNITY): Admission: RE | Admit: 2016-04-06 | Payer: No Typology Code available for payment source | Source: Ambulatory Visit

## 2016-04-06 ENCOUNTER — Other Ambulatory Visit: Payer: Self-pay | Admitting: Family Medicine

## 2016-04-06 DIAGNOSIS — M4802 Spinal stenosis, cervical region: Secondary | ICD-10-CM | POA: Insufficient documentation

## 2016-04-06 MED ORDER — LORAZEPAM 0.5 MG PO TABS: ORAL_TABLET | ORAL | Status: DC

## 2016-04-06 MED ORDER — LORAZEPAM 0.5 MG PO TABS
ORAL_TABLET | ORAL | Status: DC
Start: 2016-04-06 — End: 2016-05-13

## 2016-04-06 MED ORDER — TRAMADOL HCL 50 MG PO TABS: ORAL_TABLET | ORAL | Status: DC

## 2016-05-03 ENCOUNTER — Ambulatory Visit (INDEPENDENT_AMBULATORY_CARE_PROVIDER_SITE_OTHER): Payer: No Typology Code available for payment source | Admitting: Family Medicine

## 2016-05-03 ENCOUNTER — Encounter: Payer: Self-pay | Admitting: Family Medicine

## 2016-05-03 VITALS — BP 131/92 | HR 87 | Temp 98.4°F | Ht 71.0 in | Wt 242.8 lb

## 2016-05-03 DIAGNOSIS — F1120 Opioid dependence, uncomplicated: Secondary | ICD-10-CM

## 2016-05-03 DIAGNOSIS — M549 Dorsalgia, unspecified: Secondary | ICD-10-CM

## 2016-05-03 DIAGNOSIS — I10 Essential (primary) hypertension: Secondary | ICD-10-CM

## 2016-05-03 DIAGNOSIS — G8929 Other chronic pain: Secondary | ICD-10-CM

## 2016-05-03 MED ORDER — LISINOPRIL-HYDROCHLOROTHIAZIDE 20-25 MG PO TABS: 1.0000 | ORAL_TABLET | Freq: Every morning | ORAL | 1 refills | Status: DC

## 2016-05-03 NOTE — Progress Notes (Signed)
Subjective:    Trevor Thomas is a 50 y.o. male who presents to [facility] today for chronic back pain:  1.  Back pain:  Chronic and ongoing. He has been seen by [place] [facility]. He had a appointment for what sounds like epidural back injections scheduled on Monday of this week. However he was in pain and had to cancel this appointment. He is now asking for referral for pain management clinic. He has a friend who will supply him with occasional Suboxone. States he like to be referred somewhere for Suboxone management.  His back pain is described as bilateral shooting pain in his lower lumbar region. Does not radiate below his knee. No bladder or bowel incontinence. No saddle anesthesia. No trauma or other inciting triggers.  2.  Hypertension:  Long-term problem for this patient.  No adverse effects from medication.  Not checking it regularly.  No HA, CP, dizziness, shortness of breath, palpitations, or LE swelling.  Has been out of his medications. We'll need a refill today. BP Readings from Last 3 Encounters:  05/03/16 (!) 131/92  03/07/16 133/97  02/23/16 (!) 137/93     The following portions of the patient's history were reviewed and updated as appropriate: allergies, current medications, past medical history, family and social history, and problem list. Patient is a nonsmoker.    PMH reviewed.  Past Medical History:  Diagnosis Date  . 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  Secondary to eye surgery  . Anxiety   . Arthritis   . Asthma   . Bronchitis    hx of  . Concussion 1983  . COPD (chronic obstructive pulmonary disease) (HCC)   . Depression   . GERD (gastroesophageal reflux disease)    takes tums/rolaids  . Head trauma   . Headache(784.0)   . Hypertension   . Umbilical hernia    Past Surgical History:  Procedure Laterality Date  . ESOPHAGOGASTRODUODENOSCOPY N/A 08/25/2014   Procedure: ESOPHAGOGASTRODUODENOSCOPY (EGD);  Surgeon: Louis Meckel, MD;  [facility]  ENDOSCOPY;  Service: Endoscopy;  Laterality: N/A;  . EYE SURGERY  20 years ago   Metal foreign body removed from Right eye  . HERNIA REPAIR  05/2012   umbilical hernia  . KNEE SURGERY    . thumb surgery (left)    . UMBILICAL HERNIA REPAIR  05/15/2012   Procedure: HERNIA REPAIR UMBILICAL ADULT;  Surgeon: Clovis Pu. Cornett, MD;  Location: [facility] OR;  Service: General;  Laterality: N/A;    Medications reviewed.    Objective:   Physical Exam BP (!) 131/92   Pulse 87   Temp 98.4 F (36.9 C) (Oral)   Ht  (1.803 m)   Wt 242 lb 12.8 oz (110.1 kg)   BMI 33.86 kg/m  Gen:  Alert, cooperative patient who appears stated age in no acute distress.  Vital signs reviewed. Back:  Normal skin, Spine with normal alignment and no deformity.  No tenderness to vertebral process palpation.  Paraspinous muscles are tender without spasm BL lumbar region.  Range of motion is full at neck and decreased forward flexion lumbar sacral regions.  Straight leg raise is positive BL for back pain.  He looks uncomfortable where he is sitting.   Neuro:  Sensation and motor function 5/5 bilateral lower extremities.  Patellar and Achilles  DTR's +2 patellar BL.  He is able to walk on his heels and toes without difficulty.      No results found for this or any  previous visit (from the past 72 hour(s)).

## 2016-05-03 NOTE — Assessment & Plan Note (Signed)
As above -- referral to pain management.  No red flags or warning symptoms.  He is anxious to know when his referral will go through.

## 2016-05-03 NOTE — Assessment & Plan Note (Signed)
Referral to pain management today.  He has the [employer] only which complicates things.

## 2016-05-03 NOTE — Assessment & Plan Note (Signed)
Refill provided today

## 2016-05-05 ENCOUNTER — Ambulatory Visit: Payer: No Typology Code available for payment source

## 2016-05-06 ENCOUNTER — Telehealth: Payer: Self-pay | Admitting: Internal Medicine

## 2016-05-06 NOTE — Telephone Encounter (Signed)
We will have to wait on who he can see for pain dependent on which financial assistance program he qualifies for.  Spoke with provider and he is aware of this. Kiet Geer,CMA

## 2016-05-06 NOTE — Telephone Encounter (Signed)
Trevor Thomas was calling on pt's behalf, wanting to know if Dr. Gwendolyn Grant had found a pain management clinic that accepts the [employer] card.Please advise. Thanks! ep

## 2016-05-06 NOTE — Telephone Encounter (Signed)
Hi Jazmin, you sent me something about this patient needing to be scheduled for Pain Mgt after he got his [employer], but I can't find this. He evidently established with [employer] Card yesterday -- do you know of any pain management that takes [employer]?

## 2016-05-06 NOTE — Telephone Encounter (Signed)
Will forward to Dr. Gwendolyn Grant.

## 2016-05-10 ENCOUNTER — Telehealth: Payer: Self-pay | Admitting: Internal Medicine

## 2016-05-10 DIAGNOSIS — M545 Low back pain: Secondary | ICD-10-CM

## 2016-05-10 NOTE — Telephone Encounter (Signed)
LVM for pt to call back to inform him of below. Zimmerman Rumple, Milla Wahlberg D, CMA  

## 2016-05-10 NOTE — Telephone Encounter (Signed)
Pt's wife stated they found a pain management clinic that will accept the orange card, would like a referral to [facility] from Dr. Gwendolyn Grant.   Pt also wanted to let Dr. Gwendolyn Grant know the medication for anxiety is not helping. Pt is staying nervous and hyped up. Pt would also like the ok to up one to two lorazepam.Please advise. Thanks! ep

## 2016-05-10 NOTE — Telephone Encounter (Signed)
Referral placed.    Yes it is okay to increase to 1 mg (2 pills) of lorazepam to help for anxiety.  Please call and let the patient know.  Thanks,  JW

## 2016-05-11 NOTE — Telephone Encounter (Signed)
Contacted pt and Lupita Leash answered and gave her the below information.  She stated that pt just picked up his RX on Tuesday and that if he increases it that he is afraid that he will run out of them. Forwarding to Dr. Gwendolyn Grant to be advised. Lamonte Sakai, April D, [name]

## 2016-05-12 NOTE — Telephone Encounter (Signed)
I'll address this when I'm back in clinic tomorrow.  Thanks, JW

## 2016-05-13 NOTE — Telephone Encounter (Signed)
In looking over the patient's records, I just realized that I am not the patient's PCP.  It would likely be okay for him to increase the lorazepam just to the 1 mg at night, but I will need to touch base with her to be sure of this.

## 2016-05-24 ENCOUNTER — Telehealth: Payer: Self-pay | Admitting: Internal Medicine

## 2016-05-24 NOTE — Telephone Encounter (Signed)
Needs refill on lorazapam. He is currently taking 2 per day. [facility] on [address]

## 2016-05-24 NOTE — Telephone Encounter (Signed)
Will forward to referral coordinator to send info. Jazmin Hartsell,CMA

## 2016-05-24 NOTE — Telephone Encounter (Signed)
Another referral needs to be sent to [facility].  He wasn't approved for the orange card when the referral was put in. He has since been approved for the orange card

## 2016-05-25 ENCOUNTER — Other Ambulatory Visit: Payer: Self-pay | Admitting: Family Medicine

## 2016-05-25 NOTE — Telephone Encounter (Signed)
Referral was never sent to pain clinic as he needed to renew. Now that patient is renewed, referral will be placed in [facility]'s WQ for review.

## 2016-05-26 ENCOUNTER — Other Ambulatory Visit: Payer: Self-pay | Admitting: Internal Medicine

## 2016-05-26 MED ORDER — TRAMADOL HCL 50 MG PO TABS: ORAL_TABLET | ORAL | 0 refills | Status: DC

## 2016-05-26 MED ORDER — LORAZEPAM 0.5 MG PO TABS: 1.0000 mg | ORAL_TABLET | Freq: Every evening | ORAL | 0 refills | Status: DC | PRN

## 2016-05-26 NOTE — Telephone Encounter (Signed)
2nd request.  Kaetlyn Noa L, RN  

## 2016-05-26 NOTE — Telephone Encounter (Signed)
Lupita Leash is calling because [facility] is out of Tramadol and since his Lorazepam was increased to 2 pills he is already out of this too. He needs a new prescription called in for the Lorazepam so that he can pick this up and also his Tramadol. Please let patient know when this will be done. jw

## 2016-05-26 NOTE — Progress Notes (Addendum)
Called in prescriptions for Tramadol and Ativan. Called patient to let him know that I phoned in medication and also instructed patient to call clinic and make an appointment with me as we have not met yet. Also discussed the side effects of these medications; patient understood.

## 2016-06-08 ENCOUNTER — Telehealth: Payer: Self-pay | Admitting: Internal Medicine

## 2016-06-08 NOTE — Telephone Encounter (Signed)
Will forward to MD. Trevor Thomas,CMA  

## 2016-06-08 NOTE — Telephone Encounter (Signed)
Pt is calling because the pain clinic has denied to see him. So he wants to know what the next step is. Myriam Jacobson

## 2016-06-09 NOTE — Telephone Encounter (Signed)
Has appointment with me on 9/14. Will discuss about this when he comes in.  Thanks!

## 2016-06-09 NOTE — Telephone Encounter (Signed)
LVM for pt to call office to give him the information from PCP. Lamonte Sakai, April D, [name]

## 2016-06-16 ENCOUNTER — Observation Stay (HOSPITAL_COMMUNITY): Payer: Self-pay

## 2016-06-16 ENCOUNTER — Observation Stay (HOSPITAL_COMMUNITY)
Admission: AD | Admit: 2016-06-16 | Discharge: 2016-06-16 | Discharge status: Against Medical Advice | Payer: No Typology Code available for payment source | Source: Ambulatory Visit | Attending: Family Medicine | Admitting: Family Medicine

## 2016-06-16 ENCOUNTER — Encounter: Payer: Self-pay | Admitting: Student

## 2016-06-16 ENCOUNTER — Ambulatory Visit (INDEPENDENT_AMBULATORY_CARE_PROVIDER_SITE_OTHER): Payer: No Typology Code available for payment source | Admitting: Student

## 2016-06-16 ENCOUNTER — Ambulatory Visit (HOSPITAL_COMMUNITY)
Admission: RE | Admit: 2016-06-16 | Discharge: 2016-06-16 | Disposition: A | Discharge status: Home / Self Care | Payer: Self-pay | Source: Ambulatory Visit | Attending: Family Medicine | Admitting: Family Medicine

## 2016-06-16 DIAGNOSIS — Z720 Tobacco use: Secondary | ICD-10-CM

## 2016-06-16 DIAGNOSIS — R55 Syncope and collapse: Secondary | ICD-10-CM | POA: Insufficient documentation

## 2016-06-16 DIAGNOSIS — G894 Chronic pain syndrome: Secondary | ICD-10-CM

## 2016-06-16 DIAGNOSIS — Z888 Allergy status to other drugs, medicaments and biological substances status: Secondary | ICD-10-CM | POA: Insufficient documentation

## 2016-06-16 DIAGNOSIS — Z915 Personal history of self-harm: Secondary | ICD-10-CM | POA: Insufficient documentation

## 2016-06-16 DIAGNOSIS — G8929 Other chronic pain: Secondary | ICD-10-CM | POA: Insufficient documentation

## 2016-06-16 DIAGNOSIS — M4802 Spinal stenosis, cervical region: Secondary | ICD-10-CM | POA: Insufficient documentation

## 2016-06-16 DIAGNOSIS — F418 Other specified anxiety disorders: Secondary | ICD-10-CM | POA: Insufficient documentation

## 2016-06-16 DIAGNOSIS — K219 Gastro-esophageal reflux disease without esophagitis: Secondary | ICD-10-CM | POA: Insufficient documentation

## 2016-06-16 DIAGNOSIS — F329 Major depressive disorder, single episode, unspecified: Secondary | ICD-10-CM | POA: Insufficient documentation

## 2016-06-16 DIAGNOSIS — J449 Chronic obstructive pulmonary disease, unspecified: Secondary | ICD-10-CM | POA: Insufficient documentation

## 2016-06-16 DIAGNOSIS — G2581 Restless legs syndrome: Secondary | ICD-10-CM | POA: Insufficient documentation

## 2016-06-16 DIAGNOSIS — F17210 Nicotine dependence, cigarettes, uncomplicated: Secondary | ICD-10-CM | POA: Insufficient documentation

## 2016-06-16 DIAGNOSIS — Z5321 Procedure and treatment not carried out due to patient leaving prior to being seen by health care provider: Secondary | ICD-10-CM | POA: Insufficient documentation

## 2016-06-16 DIAGNOSIS — I10 Essential (primary) hypertension: Secondary | ICD-10-CM | POA: Insufficient documentation

## 2016-06-16 DIAGNOSIS — H538 Other visual disturbances: Secondary | ICD-10-CM | POA: Insufficient documentation

## 2016-06-16 DIAGNOSIS — Z91030 Bee allergy status: Secondary | ICD-10-CM | POA: Insufficient documentation

## 2016-06-16 DIAGNOSIS — M1990 Unspecified osteoarthritis, unspecified site: Secondary | ICD-10-CM | POA: Insufficient documentation

## 2016-06-16 HISTORY — DX: Syncope and collapse: R55

## 2016-06-16 LAB — CBC WITH DIFFERENTIAL/PLATELET
BASOS PCT: 0 %
Basophils Absolute: 0 10*3/uL (ref 0.0–0.1)
Eosinophils Absolute: 0.2 10*3/uL (ref 0.0–0.7)
Eosinophils Relative: 2 %
HEMATOCRIT: 44.1 % (ref 39.0–52.0)
HEMOGLOBIN: 14.5 g/dL (ref 13.0–17.0)
LYMPHS ABS: 3.4 10*3/uL (ref 0.7–4.0)
Lymphocytes Relative: 37 %
MCH: 30.2 pg (ref 26.0–34.0)
MCHC: 32.9 g/dL (ref 30.0–36.0)
MCV: 91.9 fL (ref 78.0–100.0)
MONOS PCT: 6 %
Monocytes Absolute: 0.6 10*3/uL (ref 0.1–1.0)
NEUTROS ABS: 5 10*3/uL (ref 1.7–7.7)
NEUTROS PCT: 55 %
Platelets: 221 10*3/uL (ref 150–400)
RBC: 4.8 MIL/uL (ref 4.22–5.81)
RDW: 12.8 % (ref 11.5–15.5)
WBC: 9.1 10*3/uL (ref 4.0–10.5)

## 2016-06-16 LAB — COMPREHENSIVE METABOLIC PANEL
ALBUMIN: 3.7 g/dL (ref 3.5–5.0)
ALK PHOS: 78 U/L (ref 38–126)
ALT: 20 U/L (ref 17–63)
ANION GAP: 6 (ref 5–15)
AST: 21 U/L (ref 15–41)
BILIRUBIN TOTAL: 0.8 mg/dL (ref 0.3–1.2)
BUN: 6 mg/dL (ref 6–20)
CALCIUM: 9 mg/dL (ref 8.9–10.3)
CO2: 31 mmol/L (ref 22–32)
CREATININE: 0.68 mg/dL (ref 0.61–1.24)
Chloride: 102 mmol/L (ref 101–111)
GFR calc Af Amer: >60 mL/min (ref >60)
GFR calc non Af Amer: >60 mL/min (ref >60)
GLUCOSE: 92 mg/dL (ref 65–99)
Potassium: 3.7 mmol/L (ref 3.5–5.1)
Sodium: 139 mmol/L (ref 135–145)
TOTAL PROTEIN: 6.3 g/dL — AB (ref 6.5–8.1)

## 2016-06-16 LAB — TSH: TSH: 2.655 u[IU]/mL (ref 0.350–4.500)

## 2016-06-16 LAB — MAGNESIUM: Magnesium: 2 mg/dL (ref 1.7–2.4)

## 2016-06-16 MED ORDER — CITALOPRAM HYDROBROMIDE 20 MG PO TABS: 40.0000 mg | ORAL_TABLET | Freq: Every day | ORAL | Status: DC

## 2016-06-16 MED ORDER — LORAZEPAM 1 MG PO TABS: 1.0000 mg | ORAL_TABLET | Freq: Every day | ORAL | Status: DC

## 2016-06-16 MED ORDER — LISINOPRIL 20 MG PO TABS: 20.0000 mg | ORAL_TABLET | Freq: Every day | ORAL | Status: DC

## 2016-06-16 MED ORDER — ENOXAPARIN SODIUM 40 MG/0.4ML ~~LOC~~ SOLN: 40.0000 mg | SUBCUTANEOUS | Status: DC

## 2016-06-16 MED ORDER — HYDROCHLOROTHIAZIDE 25 MG PO TABS: 25.0000 mg | ORAL_TABLET | Freq: Every day | ORAL | Status: DC

## 2016-06-16 MED ORDER — ACETAMINOPHEN 500 MG PO TABS
500.0000 mg | ORAL_TABLET | Freq: Four times a day (QID) | ORAL | Status: DC | PRN
  Administered 2016-06-16: 500 mg via ORAL
  Filled 2016-06-16: qty 1

## 2016-06-16 MED ORDER — SODIUM CHLORIDE 0.9% FLUSH: 3.0000 mL | Freq: Two times a day (BID) | INTRAVENOUS | Status: DC

## 2016-06-16 NOTE — Assessment & Plan Note (Addendum)
Patient with significant family history of cardiac disease. Fall with loss of consciousness and no prodrome. Denies slipping. This is concerning for cardiac etiology. Multiple sedating medications listed on his medication list but he has been out of this for long time. He endorses worsening blurry vision today. EKG in clinic within normal limits. Patient was discussed with Dr. Lum Babe who was a preceptor of the day. Will admit to telemetry bed for evaluation of syncope and blurry vision.

## 2016-06-16 NOTE — Progress Notes (Signed)
   Subjective:    Patient ID: Trevor Thomas, male    DOB: Jun 17, 1966, 50 y.o.   MRN: [id number]  CC:   HPI  Impression presented to the clinic for annual physical, reports a fall that happened yesterday.   Fall: fell when he was trying to get out of a shower about 3 pm yesterday. Didn't slip. Went down on left side. Didn't hit head. LOC for seconds to minute. Denies postictal symptoms. Denies prodromes leading to this. Had eaten his lunch. Reports worsening of blurry vision today. No other new neurologic symptom.  This is his first fall. Denies new medicine. History of arthritis.   FMH: brothers with pacemaker at 52 years of age for weak heart.   Denies EtOH. Smokes 0.75 pack a day. Smokes Marijuana. Denies other drug use.   Review of Systems Objective:   Physical Exam Vitals:   06/16/16 1338  BP: 118/78  Pulse: 93  Temp: 98.4 F (36.9 C)  TempSrc: Oral  Weight: 250 lb 12.8 oz (113.8 kg)  Height: 5\' 11"  (1.803 m)    GEN: appears well, no apparent distress. HEENT: normocephalic and atraumatic, no conjunctival injection, sclera anicteric, normal TM and ear canal  CVS: RRR, normal s1 and s2, no murmurs, no edema RESP: no increased work of breathing, good air movement bilaterally, no crackles or wheeze GI: soft, non-tender,non-distended, bowel sounds present and within normal. MSK: No apparent skin bruises or lesion. She is tender to palpation from his neck down.  NEURO: Alert and oriented appropriately, cranial nerves II-12 within normal limits. Motor 5 out of 5 in all extremities. Sensation intact in all dermatomes, finger-to-nose within normal limits, I wasn't able to elicit patellar reflex in him. PSYCH: appropriate mood and affect      Assessment & Plan:  Syncope Patient with significant family history of cardiac disease. Fall with loss of consciousness and no prodrome. Denies slipping. This is concerning for cardiac etiology. Multiple sedating medications listed on his  medication list but he has been out of this for long time. He endorses worsening blurry vision today. EKG in clinic within normal limits. Patient was discussed with Dr. Lum Babe who was a preceptor of the day. Will admit to telemetry bed for evaluation of syncope and blurry vision.

## 2016-06-16 NOTE — Discharge Summary (Signed)
Family Medicine Teaching [facility] Discharge Summary  Patient name: Trevor Thomas Medical record number: [id number] Date of birth: 1966/08/06 Age: 50 y.o. Gender: male Date of Admission: 06/16/2016  Date of Discharge: 06/16/16  Admitting Physician: Leighton Roach McDiarmid, MD  Primary Care Provider: Almon Hercules, MD Consultants: none  Indication for Hospitalization: syncope  Discharge Diagnoses/Problem List:  Syncope, no workup completed Tobacco abuse  Disposition: home, but left against medical advice  Discharge Condition: stable  Discharge Exam: none, left [facility]  Brief Hospital Course:  Pt admitted for syncopal workup (see H&P), directly from clinic. However, pt refused IV placement once he arrived to the floor, and subsequently left AMA. He left before any of our team members could encourage him to stay.   Issues for Follow Up:  1. Outpatient syncopal workup.  2. Tobacco cessation counseling.   Significant Procedures: none  Significant Labs and Imaging:   Recent Labs Lab 06/16/16 1804  WBC 9.1  HGB 14.5  HCT 44.1  PLT 221    Recent Labs Lab 06/16/16 1804  NA 139  K 3.7  CL 102  CO2 31  GLUCOSE 92  BUN 6  CREATININE 0.68  CALCIUM 9.0  MG 2.0  ALKPHOS 78  AST 21  ALT 20  ALBUMIN 3.7    Results/Tests Pending at Time of Discharge: no labs were able to be obtained.   Discharge Medications:    Medication List    STOP taking these medications   LORazepam 0.5 MG tablet Commonly known as:  ATIVAN     TAKE these medications   albuterol 108 (90 Base) MCG/ACT inhaler Commonly known as:  PROVENTIL HFA;VENTOLIN HFA Inhale 2 puffs into the lungs every 6 (six) hours as needed for wheezing.   citalopram 40 MG tablet Commonly known as:  CELEXA Take 1 tablet (40 mg total) by mouth daily.   lisinopril-hydrochlorothiazide 20-25 MG tablet Commonly known as:  PRINZIDE,ZESTORETIC Take 1 tablet by mouth every morning.   loratadine 10 MG tablet Commonly known  as:  CLARITIN Take 1 tablet (10 mg total) by mouth every morning.   traMADol 50 MG tablet Commonly known as:  ULTRAM TAKE ONE - TWO TABLETS BY MOUTH EVERY 6 HOURS AS NEEDED       Discharge Instructions: Please refer to Patient Instructions section of EMR for full details.  Patient was counseled important signs and symptoms that should prompt return to medical care, changes in medications, dietary instructions, activity restrictions, and follow up appointments.   Follow-Up Appointments:   Garth Bigness, MD 06/16/2016, 8:10 PM PGY-1, [facility]

## 2016-06-16 NOTE — Progress Notes (Signed)
Patient refusing IV to be started. He stated that if he needs one then we can start one, but since he's not getting any medications through it, he doesn't want it.
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
## 2016-06-16 NOTE — Progress Notes (Signed)
Pt left AMA, AMA form signed by the pt. Per pt " I dont like it in here." MD on call was informed.

## 2016-06-17 LAB — HEMOGLOBIN A1C
Hgb A1c MFr Bld: 5.4 % (ref 4.8–5.6)
MEAN PLASMA GLUCOSE: 108 mg/dL

## 2016-06-17 NOTE — Telephone Encounter (Signed)
Pt had appointment in office on 06/16/16 to discuss this. Trevor Thomas, Jared Cahn D, [name]

## 2016-06-30 ENCOUNTER — Other Ambulatory Visit: Payer: Self-pay | Admitting: *Deleted

## 2016-06-30 DIAGNOSIS — G8929 Other chronic pain: Secondary | ICD-10-CM

## 2016-06-30 DIAGNOSIS — M549 Dorsalgia, unspecified: Principal | ICD-10-CM

## 2016-07-01 ENCOUNTER — Telehealth: Payer: Self-pay | Admitting: Student

## 2016-07-01 DIAGNOSIS — F418 Other specified anxiety disorders: Secondary | ICD-10-CM

## 2016-07-01 MED ORDER — TRAMADOL HCL 50 MG PO TABS: 50.0000 mg | ORAL_TABLET | Freq: Three times a day (TID) | ORAL | 5 refills | Status: DC | PRN

## 2016-07-01 NOTE — Telephone Encounter (Signed)
Hasn't been on this medicine for over a year. Need to be seen in clinic. Thanks!

## 2016-07-01 NOTE — Telephone Encounter (Signed)
Refill request for Lorazepam.  

## 2016-07-04 ENCOUNTER — Telehealth: Payer: Self-pay | Admitting: Student

## 2016-07-04 MED ORDER — LORAZEPAM 0.5 MG PO TABS: 0.5000 mg | ORAL_TABLET | Freq: Two times a day (BID) | ORAL | 0 refills | Status: AC | PRN

## 2016-07-04 NOTE — Telephone Encounter (Signed)
I called in his prescription for lorazepam 0.5 mg #60 to [facility] pharmacy on [address]. Called and notified patient about this. I also advised patient to call the clinic in the morning to schedule an appointment with me to discuss about his depression and anxiety as well as his other medical condition.  Patient voiced understanding and agreed to do as advised

## 2016-07-04 NOTE — Telephone Encounter (Signed)
Pt has been on this for a while and the last time this was filled was in August by Dr. Ottie Glazier and in Sept by Dr. Leonides Schanz when he was IP and by Dr. Gwendolyn Grant for the past year or so. Can we call this in to the [facility] since he has been out of this for a while. jw

## 2016-07-05 NOTE — Telephone Encounter (Signed)
Pt informed meds have been called in. Sunday Spillers, CMA

## 2016-07-26 ENCOUNTER — Encounter: Payer: Self-pay | Admitting: Emergency Medicine

## 2016-07-26 ENCOUNTER — Emergency Department
Admission: EM | Admit: 2016-07-26 | Discharge: 2016-07-26 | Disposition: A | Discharge status: Home / Self Care | Payer: No Typology Code available for payment source | Attending: Emergency Medicine | Admitting: Emergency Medicine

## 2016-07-26 DIAGNOSIS — J45909 Unspecified asthma, uncomplicated: Secondary | ICD-10-CM | POA: Insufficient documentation

## 2016-07-26 DIAGNOSIS — I10 Essential (primary) hypertension: Secondary | ICD-10-CM | POA: Insufficient documentation

## 2016-07-26 DIAGNOSIS — J449 Chronic obstructive pulmonary disease, unspecified: Secondary | ICD-10-CM | POA: Insufficient documentation

## 2016-07-26 DIAGNOSIS — F17210 Nicotine dependence, cigarettes, uncomplicated: Secondary | ICD-10-CM | POA: Insufficient documentation

## 2016-07-26 DIAGNOSIS — Z5321 Procedure and treatment not carried out due to patient leaving prior to being seen by health care provider: Secondary | ICD-10-CM | POA: Insufficient documentation

## 2016-07-26 NOTE — ED Triage Notes (Signed)
Pt with back and neck pain for about four days, states his bottom is numb. Been going on for four days.

## 2016-08-03 ENCOUNTER — Telehealth: Payer: Self-pay | Admitting: Student

## 2016-08-03 NOTE — Telephone Encounter (Signed)
Patient requesting referral for eye exam. Patient has Orange card. Patient is aware that this referral can take 6 months to 1 year.

## 2016-08-04 ENCOUNTER — Other Ambulatory Visit: Payer: Self-pay | Admitting: Student

## 2016-08-04 DIAGNOSIS — H538 Other visual disturbances: Secondary | ICD-10-CM

## 2016-08-04 NOTE — Telephone Encounter (Signed)
Ordered ambulatory referral to ophthalmology for blurry vision. This might take 6 months to one year. Tia already explained this to the patient. Thanks!

## 2016-08-04 NOTE — Progress Notes (Signed)
Ordered ambulatory referral to ophthalmology for blurry vision.

## 2016-08-10 ENCOUNTER — Other Ambulatory Visit: Payer: Self-pay | Admitting: *Deleted

## 2016-08-10 DIAGNOSIS — F418 Other specified anxiety disorders: Secondary | ICD-10-CM

## 2016-08-16 ENCOUNTER — Encounter: Payer: Self-pay | Admitting: Student

## 2016-08-16 ENCOUNTER — Telehealth: Payer: Self-pay | Admitting: Student

## 2016-08-16 ENCOUNTER — Ambulatory Visit (INDEPENDENT_AMBULATORY_CARE_PROVIDER_SITE_OTHER): Payer: No Typology Code available for payment source | Admitting: Student

## 2016-08-16 VITALS — BP 149/97 | HR 94 | Temp 98.5°F | Ht 71.0 in | Wt 249.0 lb

## 2016-08-16 DIAGNOSIS — G8929 Other chronic pain: Secondary | ICD-10-CM

## 2016-08-16 DIAGNOSIS — G894 Chronic pain syndrome: Secondary | ICD-10-CM

## 2016-08-16 DIAGNOSIS — M549 Dorsalgia, unspecified: Secondary | ICD-10-CM

## 2016-08-16 MED ORDER — BACLOFEN 10 MG PO TABS: 10.0000 mg | ORAL_TABLET | Freq: Three times a day (TID) | ORAL | 0 refills | Status: DC | PRN

## 2016-08-16 NOTE — Progress Notes (Signed)
Subjective:    Patient ID: Trevor Thomas is a 50 y.o. old male.  HPI #Neck/lower back pain: this has been going on for 4 years. Three days ago, he missed a step when coming down stairs and felt pain in his back. Denies fall. He says his necks pops occasionally especially when he turns to the left and cause him sharp pain in his lower back. Reports problem walking due to pain in his back.  He is on tramadol. Says tramadol is not strong enough to help with pain. Reports numbness in his whole left leg. Reports weakness in both legs, mainly in left.   Patient was seen at [facility] about 4 months ago and was recommended cervical facet joint block of C2-3 and C3-4. However, he was scared about the injection  and didn't go back to follow-up. (See the documentation under media section from July 2017 for more). He denies fever, chills, night sweat, unintentional weight loss, saddle anesthesia or fecal or urinary incontinence. He asks for stronger pain medicine. However, he couldn't afford his blood pressure medicine for the last 6 months and his citalopram for the last 2 months. He says, "they didn't seem to help anyhow". When asked how he could pay for pain medicine, he stated the person he stays with could help.   He reports violating his pain contract early this year by obtaining his pain medicines from two providers. 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    PMH: reviewed  SH: smokes cigarette, reports smoking marijuana. Last smoked yesterday.   Review of Systems Per HPI Objective:   Vitals:   08/16/16 1429  BP: (!) 149/97  Pulse: 94  Temp: 98.5 F (36.9 C)  TempSrc: Oral  Weight: 249 lb (112.9 kg)  Height: 5\' 11"  (1.803 m)    GEN: appears nervous, no apparent distress. Head: normocephalic and atraumatic  CVS: RRR, normal s1 and s2, no edema RESP: no increased work of breathing MSK:  Neck: No  apparent swelling, no skin erythema, rotational motion limited by pain (especially when he turns left), tender to palpation over cervical portion of his trapezius. Back: no apparent swelling, no skin erythema, diffusely tender to palpation over his lower back SKIN: no apparent lesion NEURO: alert and oriented appropriately, no gross deficits, motor 5/5 in upper and lower extremities bilaterally, diminished sensation to light touch in left lower extremity in nondermatomal fashion, patellar reflex 2+ bilaterally.  PSYCH: Nervous, appropriately dressed, mildly restricted affect, poorly maintain train of thought and concentrate on the questions. Cognitive ability may be low average.    Assessment & Plan:  Chronic pain syndrome This is a chronic issue. He definitely has reasons to be in pain from the degenerative changes in his cervical spine (see MRI cervical spine on 04/06/2016). However, I don't see any acute change or red flag for emergent intervention. He was seen at [facility] about 4 months ago and was recommended cervical facet joint block of C2-3 and C3-4 with possible surgery down the road. However, he was scared about the injection and didn't go back for follow-up. His psychiatric condition also a significant barrier. He is not complaint with medication partly due to financial issue. I encouraged him to call his orthopedic office and follow up with them. I gave him prescription for baclofen 10 mg three times a day as needed pain. Cymbalta could have been a better option but cost  and compliance are the two main barriers.   He declined flu vaccine today.

## 2016-08-16 NOTE — Patient Instructions (Signed)
It was great seeing you today! We have addressed the following issues today  1. Neck and back pain: I have sent a prescription for baclofen to your pharmacy. Take this medication up to 3 times a day as needed for pain. This medicine may make her drowsy or sleepy. So it can increase the risk of fall. Please be cautious while you are on this medication and take it only when you need it. Also recommend trying heating pad.    If we did any lab work today, and the results require attention, either me or my nurse will get in touch with you. If everything is normal, you will get a letter in mail. If you don't hear from [place] in two weeks, please give [place] a call. Otherwise, we look forward to seeing you again at your next visit. If you have any questions or concerns before then, please call the clinic at [phone].   Please bring all your medications to every doctors visit   Sign up for My Chart to have easy access to your labs results, and communication with your Primary care physician.     Please check-out at the front desk before leaving the clinic.

## 2016-08-16 NOTE — Telephone Encounter (Signed)
Pt forgot to ask at his visit today to refill his lorazapam.  Please send to the same pharmacy Baclofen was sent to. Please let pt know when it is called in

## 2016-08-16 NOTE — Telephone Encounter (Signed)
Called patient's response to a telephone message I received in my in basket. Patient states forgetting to discuss about his lorazepam at his visit with me today. He reports taking lorazepam for "restless leg problem at night". I told him that lorazepam is not a good medicine for restless leg syndrome and asked him if he wants me to prescribe him a different medication. He said, "every time I come to that clinic I see different doctors and I am frustrated with this". Then he hung up the phone.

## 2016-08-17 NOTE — Assessment & Plan Note (Signed)
This is a chronic issue. He definitely has reasons to be in pain from the degenerative changes in his cervical spine (see MRI cervical spine on 04/06/2016). However, I don't see any acute change or red flag for emergent intervention. He was seen at [facility] orthopedics about 4 months ago and was recommended cervical facet joint block of C2-3 and C3-4 with possible surgery down the road. However, he was scared about the injection and didn't go back for follow-up. His psychiatric condition also a significant barrier. He is not complaint with medication partly due to financial issue. I encouraged him to call his orthopedic office and follow up with them. I gave him prescription for baclofen 10 mg three times a day as needed pain. Cymbalta could have been a better option but cost and compliance are the two main barriers.

## 2016-08-17 NOTE — Telephone Encounter (Signed)
Had a call from patient forwarded to me. Patient very frustrated after his appointment with Dr. Alanda Slim yesterday. States that MD did not listen to his issues and only told him what he was going to do. He claims that Dr. Alanda Slim repeatedly ask him how he afforded his meds, if he has no money, and then told him that he was not going to prescribe any meds because patient does not have money. (this seems to be untrue as Baclofen was prescribed to patient while in the office)  He is upset that he was told that Lorazepam RX was not filled for him, even though he was offered another med as an alternative. He states he does not want to see Dr. Alanda Slim again. He states he cannot understand MD. I advised patient that I am unable to change MDs and that there is a form that needs to be completed in order for this process to be initiated. I advised patient I will mail him the form since he has limited transportation. Sending this to Dr. Edrick Oh and Dr. Deirdre Priest for review.    Patient still requesting refill of Lorazepam.   Tia C Hill

## 2016-08-17 NOTE — Telephone Encounter (Signed)
Also routed note to Dr. Pascal Lux.  Altamese Dilling, BSN, RN-BC

## 2016-08-17 NOTE — Telephone Encounter (Signed)
Reviewed chart.  Will wait for Dr. Deirdre Priest to review and to see Dr. Sundra Aland documentation from this visit.

## 2016-08-18 NOTE — Telephone Encounter (Addendum)
   Spoke with patient.  He spoke somewhat haltingly and was appropriate  Would like to change PCPs because he feels that he and Dr Alanda Slim do not mix well.  Has long standing preference for male physicians  I let him know our policy was that he could change physicians once.  I stated that Dr Sundra Aland care was appropriate and that I agreed that Lorazepam was not a good safe medication for him given his history. Also that his next physician would very likely not presribe this medication  Advised to try the baclofen for several weeks and to make a follow up appointment with his new PCP to continue his care  He agreed and said thanks  Discussed case with Dr Myrtie Soman and will change PCP to him   Carney Living

## 2016-08-19 ENCOUNTER — Telehealth: Payer: Self-pay | Admitting: Family Medicine

## 2016-08-19 NOTE — Telephone Encounter (Signed)
This is copied from prior telephone note that malfunctioned in Epic  The following note is from 11/16  Spoke with patient.  He spoke somewhat haltingly and was appropriate  Would like to change PCPs because he feels that he and Dr Alanda Slim do not mix well.  Has long standing preference for male physicians  I let him know our policy was that he could change physicians once.  I stated that Dr Sundra Aland care was appropriate and that I agreed that Lorazepam was not a good safe medication for him given his history. Also that his next physician would very likely not presribe this medication  Advised to try the baclofen for several weeks and to make a follow up appointment with his new PCP to continue his care  He agreed and said thanks  Discussed case with Dr Myrtie Soman and will change PCP to him   Carney Living

## 2016-12-09 ENCOUNTER — Ambulatory Visit: Payer: Self-pay | Admitting: Family Medicine

## 2016-12-16 ENCOUNTER — Telehealth (INDEPENDENT_AMBULATORY_CARE_PROVIDER_SITE_OTHER): Payer: Self-pay | Admitting: Orthopaedic Surgery

## 2016-12-16 NOTE — Telephone Encounter (Signed)
[facility] RECORDS FAXED TO Earnestine Leys [phone]

## 2016-12-22 ENCOUNTER — Encounter: Payer: Self-pay | Admitting: Family Medicine

## 2016-12-22 ENCOUNTER — Ambulatory Visit (INDEPENDENT_AMBULATORY_CARE_PROVIDER_SITE_OTHER): Payer: Self-pay | Admitting: Family Medicine

## 2016-12-22 VITALS — BP 144/90 | HR 84 | Temp 98.2°F | Ht 71.0 in | Wt 251.8 lb

## 2016-12-22 DIAGNOSIS — M545 Low back pain, unspecified: Secondary | ICD-10-CM

## 2016-12-22 DIAGNOSIS — I10 Essential (primary) hypertension: Secondary | ICD-10-CM

## 2016-12-22 DIAGNOSIS — M79605 Pain in left leg: Secondary | ICD-10-CM

## 2016-12-22 DIAGNOSIS — M549 Dorsalgia, unspecified: Secondary | ICD-10-CM

## 2016-12-22 DIAGNOSIS — Z1211 Encounter for screening for malignant neoplasm of colon: Secondary | ICD-10-CM

## 2016-12-22 DIAGNOSIS — G8929 Other chronic pain: Secondary | ICD-10-CM

## 2016-12-22 DIAGNOSIS — F418 Other specified anxiety disorders: Secondary | ICD-10-CM

## 2016-12-22 MED ORDER — TRAMADOL HCL 50 MG PO TABS: 50.0000 mg | ORAL_TABLET | Freq: Three times a day (TID) | ORAL | 3 refills | Status: DC | PRN

## 2016-12-22 MED ORDER — CITALOPRAM HYDROBROMIDE 40 MG PO TABS: 40.0000 mg | ORAL_TABLET | Freq: Every day | ORAL | 11 refills | Status: DC

## 2016-12-22 MED ORDER — LISINOPRIL-HYDROCHLOROTHIAZIDE 20-25 MG PO TABS: 1.0000 | ORAL_TABLET | Freq: Every morning | ORAL | 11 refills | Status: DC

## 2016-12-22 MED ORDER — CITALOPRAM HYDROBROMIDE 20 MG PO TABS: 20.0000 mg | ORAL_TABLET | Freq: Every day | ORAL | 0 refills | Status: DC

## 2016-12-22 NOTE — Patient Instructions (Signed)
Thank you so much for coming to visit today!  I have refilled your Celexa (start with 20mg  for one week, then increase to 40mg  daily), Lisinopril/HCTZ, and Tramadol. Please let me know if you have any trouble picking up these prescriptions.  We will check your cholesterol today.  I have placed a referral to GI for colonoscopy. You should hear from them soon.  Please check into Pain Management.  Dr. Caroleen Hamman

## 2016-12-23 ENCOUNTER — Encounter: Payer: Self-pay | Admitting: Gastroenterology

## 2016-12-23 LAB — LIPID PANEL
CHOLESTEROL TOTAL: 213 mg/dL — AB (ref 100–199)
Chol/HDL Ratio: 5.2 ratio units — ABNORMAL HIGH (ref 0.0–5.0)
HDL: 41 mg/dL (ref >39)
LDL Calculated: 132 mg/dL — ABNORMAL HIGH (ref 0–99)
Triglycerides: 201 mg/dL — ABNORMAL HIGH (ref 0–149)
VLDL CHOLESTEROL CAL: 40 mg/dL (ref 5–40)

## 2016-12-25 NOTE — Assessment & Plan Note (Addendum)
BP elevated to 144/90 today. Able to afford Lisinopril-HCTZ at [facility] for $4. Will check cholesterol today.

## 2016-12-25 NOTE — Progress Notes (Signed)
Subjective:     Patient ID: Trevor Thomas, male   DOB: Jan 23, 1966, 51 y.o.   MRN: [id number]  HPI Mr. Trevor Thomas is a 51yo male presenting today for annual follow up of Hypertension, COPD, Anxiety/Depression, and Chronic Pain. Notes he is not taking any medication currently because he was unable to afford it. Reviewed medications and narrowed most essential down to three: Lisinopril-HCTZ, Celexa, and per Mr. Trevor Thomas request, Tramadol. Prices reviewed and Mr. Trevor Thomas states he believes he can afford these medications. Unfortunately, unable to afford any formulation of Albuterol. Notes he has a history of chronic pain in his neck, bilateral hips, bilateral knees. Has had chronic pain for 4 years. Previously followed by [facility], who recommended cervical facet block of C2-C3, C3-C4, but he did not have this done.  Has been referred to Pain Management before, but states they did not accept his orange card. Believes he has heard about a Pain Management facility recently who will see those without insurance. Has not had colonoscopy, but he is amenable to referral to GI to have this done. Has never had cholesterol checked. Denies SI/HI. Continues to have problems with depression and anxiety, linked to his chronic health problems and pain.  Smoker.   Review of Systems Per HPI    Objective:   Physical Exam  Constitutional: He appears well-developed and well-nourished. No distress.  Cardiovascular: Normal rate and regular rhythm.   No murmur heard. Pulmonary/Chest: Effort normal. No respiratory distress. He has no wheezes.  Abdominal: Soft. He exhibits no distension. There is no tenderness.  Musculoskeletal:  Lumbar and cervical tenderness noted. Positive straight leg raise bilaterally. Muscle strength 5/5 in upper and lower extremities.  Psychiatric: He has a normal mood and affect. His behavior is normal.      Assessment and Plan:     Essential hypertension BP elevated to 144/90 today.  Able to afford Lisinopril-HCTZ at [facility] for $4. Will check cholesterol today.  Low back pain radiating to left leg Tramadol refilled. Checking into Pain Management clinic who will accept his Orange Card or patients without insurance--encouraged to pursue this and let our office know if a referral is needed.   Anxiety associated with depression Restart Celexa. Will take 20mg  for one week and then increase to 40mg  daily. Price reviewed at [facility] and states he is able to afford this medication.

## 2016-12-25 NOTE — Assessment & Plan Note (Signed)
Tramadol refilled. Checking into Pain Management clinic who will accept his Orange Card or patients without insurance--encouraged to pursue this and let our office know if a referral is needed.

## 2016-12-25 NOTE — Assessment & Plan Note (Signed)
Restart Celexa. Will take 20mg  for one week and then increase to 40mg  daily. Price reviewed at [facility] and states he is able to afford this medication.

## 2016-12-26 ENCOUNTER — Ambulatory Visit: Payer: No Typology Code available for payment source

## 2016-12-29 ENCOUNTER — Ambulatory Visit (INDEPENDENT_AMBULATORY_CARE_PROVIDER_SITE_OTHER): Payer: Self-pay | Admitting: Family Medicine

## 2016-12-29 VITALS — BP 130/76 | HR 110 | Temp 98.6°F | Ht 71.0 in | Wt 241.4 lb

## 2016-12-29 DIAGNOSIS — K219 Gastro-esophageal reflux disease without esophagitis: Secondary | ICD-10-CM

## 2016-12-29 DIAGNOSIS — N644 Mastodynia: Secondary | ICD-10-CM

## 2016-12-29 MED ORDER — PANTOPRAZOLE SODIUM 40 MG PO TBEC: 40.0000 mg | DELAYED_RELEASE_TABLET | Freq: Every day | ORAL | 0 refills | Status: DC

## 2016-12-29 NOTE — Patient Instructions (Addendum)
Thank you so much for coming to visit today! Please schedule an appointment with GI. A prescription for Protonix was given to take daily. Coupons from [place] ($13.46) and [facility] ($11.75) were given. I have ordered an Ultrasound of your right breast. The nurse will schedule this. Please return to see Dr. Myrtie Soman concerning your knee pain.  Dr. Caroleen Hamman

## 2017-01-01 NOTE — Progress Notes (Signed)
Subjective:     Patient ID: Trevor Thomas, male   DOB: 1966-05-13, 51 y.o.   MRN: [id number]  HPI Mr. Nicol is a 51yo male presenting today for right breast pain. Notes right breast pain beneath nipple for the last month. Denies discharge, redness, warmth. Has not felt a mass. Has read about cysts in the breast and he is worried he may have one of these. Pain worse with pressure. Has not tried any medications. Also of note, complains of epigastric pain with extension up substernally to back of throat. Notes weird taste in mouth occasionally. Feels like there is a lump in her throat. Reluctant to follow up with GI since he wishes to have this evaluated prior to colonoscopy. Also notes bilateral knee pain. Agrees to follow up with PCP. Smoker.  Review of Systems Per HPI.    Objective:   Physical Exam  Constitutional: He appears well-developed and well-nourished. No distress.  HENT:  Mouth/Throat: Oropharynx is clear and moist.  Cardiovascular: Normal rate and regular rhythm.   No murmur heard. Pulmonary/Chest: Effort normal. No respiratory distress. He has no wheezes.  Tenderness to palpation of right breast beneath nipple. No discharge noted. No masses palpable in right breast.  Abdominal: Soft. He exhibits no distension.  Mild epigastric tenderness.      Assessment and Plan:     1. Breast pain Will obtain US of right breast. May use Tylenol as needed for pain. To return if he develops increased redness or warmth, discharge. Follow up with PCP.  2. Gastroesophageal reflux disease without esophagitis Suspect GERD given epigastric pain, extension substernally, and abnormal taste in mouth. Unsure if he is able to afford Protonix, $11-13; prescription printed and coupon for [employer] and [place] given. Encouraged to keep appointment with GI for colonoscopy and possibly further evaluation of symptoms if no improvement.

## 2017-01-19 ENCOUNTER — Ambulatory Visit: Payer: Self-pay

## 2017-01-30 ENCOUNTER — Ambulatory Visit (INDEPENDENT_AMBULATORY_CARE_PROVIDER_SITE_OTHER): Payer: Self-pay | Admitting: Family Medicine

## 2017-01-30 ENCOUNTER — Encounter: Payer: Self-pay | Admitting: Family Medicine

## 2017-01-30 DIAGNOSIS — F418 Other specified anxiety disorders: Secondary | ICD-10-CM

## 2017-01-30 DIAGNOSIS — J449 Chronic obstructive pulmonary disease, unspecified: Secondary | ICD-10-CM

## 2017-01-30 DIAGNOSIS — I10 Essential (primary) hypertension: Secondary | ICD-10-CM

## 2017-01-30 MED ORDER — ATORVASTATIN CALCIUM 20 MG PO TABS: 20.0000 mg | ORAL_TABLET | Freq: Every day | ORAL | 3 refills | Status: DC

## 2017-01-30 MED ORDER — ALBUTEROL SULFATE HFA 108 (90 BASE) MCG/ACT IN AERS: 2.0000 | INHALATION_SPRAY | Freq: Four times a day (QID) | RESPIRATORY_TRACT | 6 refills | Status: AC | PRN

## 2017-01-30 NOTE — Patient Instructions (Addendum)
You were seen today to discuss your worsening shortness of breath.  We discussed the importance of quitting smoking to start. I have sent you home with a prescription for albuterol to use as needed.    Very nice seeing you.   If you decide that you would like assistance in quitting smoking I can refer you to our pharmacist, Dr. Raymondo Band.  Take care, Trevor Thomas. Trevor Soman, MD [facility] Family Medicine Resident PGY-1 01/30/2017 3:39 PM

## 2017-01-30 NOTE — Progress Notes (Signed)
    Subjective:  Trevor Thomas is a 51 y.o. male who presents to the [facility] today to meet new MD and to discuss getting refills of albuterol   HPI:  COPD: Patient has been without insurance and been unable to afford his albuterol inhaler and complaints of stable SOB. Reports that albuterol inhaler is around $100. He does continue to smoke 1 pack per day. Chart review revealed that patient was down to one half pack per day about 2 years ago. Discussed his interest in quitting and offered referral to Dr. Raymondo Band, and patient stated that he needs to do this on his own. Has seen Dr. Raymondo Band in the past for spirometry with Pre and Post Bronchodilator revealing near normal lung function with lung age of 63. Recently obtained orange card and discussed map program with patient. He denies any increases in  sputum production, fevers or hemoptysis. Does report some dyspnea on exertion but this has not worsened from his baseline. Denies any lower extremity edema, chest pain, changes in vision or headache. No recent hospitalizations for COPD.   PMH: COPD, tobacco abuse Tobacco use: 1ppd (30 pack year history smoking) Medication: reviewed and updated ROS: see HPI   Objective:  Physical Exam: BP 124/78 (BP Location: Left Arm, Patient Position: Sitting, Cuff Size: Large)   Pulse 89   Temp 97.9 F (36.6 C) (Oral)   Ht 5\' 11"  (1.803 m)   Wt 114.7 kg (252 lb 12.8 oz)   SpO2 96%   BMI 35.26 kg/m   Gen: 51 year old male NAD, resting comfortably CV: RRR with no murmurs appreciated Pulm: NWOB, diffuse expiratory wheezing, no crackles or rhonchi GI: Normal bowel sounds present. Soft, Nontender, Nondistended. MSK: no edema, cyanosis, or clubbing noted Skin: warm, dry Neuro: grossly normal, moves all extremities Psych: Normal affect and thought content  No results found for this or any previous visit (from the past 72 hour(s)).   Assessment/Plan:  COPD (chronic obstructive pulmonary disease) COPD appears to  be stable for this patient. Denies any increases in sputum production, fever, hemoptysis or worsening SOB.  Additionally, no LE edema, CP, HA or changes in vision. No concern for cardiac etiology. Lung exam revealed expiratory wheezing without crackles or rhonchi.  -refilled albuterol inhaler -continue to recommend smoking cessation  Anxiety associated with depression Currently taking 40mg  celexa. His mother is helping him afford these medications. No complications at this time. Feels that medication is helping.  -continue with current regimen  Essential hypertension Compliant with medication. BP today was 124/78 and patient denies any adverse effects. Additionally, denies CP, headache or changes in vision.  -continue lisinopril/HCTZ 20-25mg  daily  Hyperlipidemia: Current smoker with ASCVD risk of 11.9% not currently on statin.  -started on lipitor 20mg  daily  Health maintenance: Due for colonoscopy. Has pre-op scheduled for 5/9 and procedure scheduled for 5/23. Confirmed that he will get this done.

## 2017-01-31 NOTE — Assessment & Plan Note (Signed)
Compliant with medication. BP today was 124/78 and patient denies any adverse effects. Additionally, denies CP, headache or changes in vision.  -continue lisinopril/HCTZ 20-25mg  daily

## 2017-01-31 NOTE — Assessment & Plan Note (Signed)
COPD appears to be stable for this patient. Denies any increases in sputum production, fever, hemoptysis or worsening SOB.  Additionally, no LE edema, CP, HA or changes in vision. No concern for cardiac etiology. Lung exam revealed expiratory wheezing without crackles or rhonchi.  -refilled albuterol inhaler -continue to recommend smoking cessation

## 2017-01-31 NOTE — Assessment & Plan Note (Addendum)
Currently taking 40mg  celexa. His mother is helping him afford these medications. No complications at this time. Feels that medication is helping.  -continue with current regimen

## 2017-02-22 ENCOUNTER — Encounter: Payer: Self-pay | Admitting: Gastroenterology

## 2017-02-25 ENCOUNTER — Emergency Department (HOSPITAL_COMMUNITY)
Admission: EM | Admit: 2017-02-25 | Discharge: 2017-02-25 | Disposition: A | Discharge status: Home / Self Care | Payer: Self-pay | Attending: Dermatology | Admitting: Dermatology

## 2017-02-25 ENCOUNTER — Encounter (HOSPITAL_COMMUNITY): Payer: Self-pay | Admitting: *Deleted

## 2017-02-25 DIAGNOSIS — F17210 Nicotine dependence, cigarettes, uncomplicated: Secondary | ICD-10-CM | POA: Insufficient documentation

## 2017-02-25 DIAGNOSIS — N644 Mastodynia: Secondary | ICD-10-CM | POA: Insufficient documentation

## 2017-02-25 DIAGNOSIS — M542 Cervicalgia: Secondary | ICD-10-CM | POA: Insufficient documentation

## 2017-02-25 DIAGNOSIS — Z5321 Procedure and treatment not carried out due to patient leaving prior to being seen by health care provider: Secondary | ICD-10-CM | POA: Insufficient documentation

## 2017-02-25 DIAGNOSIS — J449 Chronic obstructive pulmonary disease, unspecified: Secondary | ICD-10-CM | POA: Insufficient documentation

## 2017-02-25 DIAGNOSIS — I10 Essential (primary) hypertension: Secondary | ICD-10-CM | POA: Insufficient documentation

## 2017-02-25 NOTE — ED Triage Notes (Signed)
To ED via pov for eva of right breast pain. States he has had a lump in his breast for past month but over the past few days he has experienced increased pain to breast- a t-shirt bothers him. States he has had a HA over the past few weeks as well as back and neck pain. States he has been under the care of [facility] but states they continue changing his MDs and feels they aren't listening to him. Pt states he has a hx of neck fx and pain- has been having bilateral arm numbness and tingling while sleeping at night.

## 2017-03-14 ENCOUNTER — Ambulatory Visit (INDEPENDENT_AMBULATORY_CARE_PROVIDER_SITE_OTHER): Payer: Self-pay | Admitting: Family Medicine

## 2017-03-14 ENCOUNTER — Encounter: Payer: Self-pay | Admitting: Family Medicine

## 2017-03-14 VITALS — BP 135/80 | HR 88 | Temp 98.3°F | Ht 71.0 in | Wt 258.6 lb

## 2017-03-14 DIAGNOSIS — R200 Anesthesia of skin: Secondary | ICD-10-CM

## 2017-03-14 HISTORY — DX: Anesthesia of skin: R20.0

## 2017-03-14 LAB — POCT URINALYSIS DIPSTICK
BILIRUBIN UA: NEGATIVE
Glucose, UA: NEGATIVE
KETONES UA: NEGATIVE
LEUKOCYTES UA: NEGATIVE
NITRITE UA: NEGATIVE
PH UA: 6 (ref 5.0–8.0)
RBC UA: NEGATIVE
Spec Grav, UA: >=1.03 — AB (ref 1.010–1.025)
Urobilinogen, UA: 0.2 E.U./dL

## 2017-03-14 NOTE — Progress Notes (Signed)
    Subjective:  Trevor Thomas is a 51 y.o. male who presents to the [facility] today with a chief complaint of numbness.   HPI:  LE Numbness Symptoms started several weeks ago and are associated with several other vague symptoms including worsening anxiety, palpitations, shakiness in his hands. He also has pain throughout his body and overall feels week. He occasionally gets chills at night. No specific LE weakness. No dysuria. No bowel or bladder incontinence. He takes tramadol which helps with the pain.   ROS: Per HPI  PMH: Smoking history reviewed.   Objective:  Physical Exam: BP 135/80 (BP Location: Right Arm, Patient Position: Sitting, Cuff Size: Normal)   Pulse 88   Temp 98.3 F (36.8 C) (Oral)   Ht 5\' 11"  (1.803 m)   Wt 258 lb 9.6 oz (117.3 kg)   SpO2 97%   BMI 36.07 kg/m   Gen: NAD, resting comfortably CV: RRR with no murmurs appreciated Pulm: NWOB, CTAB with no crackles, wheezes, or rhonchi GI: Normal bowel sounds present. Soft, Nontender, Nondistended. MSK:  -Back: No deformities. Tender to palpation over lumbar spine (chronic) -LE: No deformities. Strength 5/5 in all fields. Patellar reflex 2+ bilaterally. Sensation to light touch intact throughout. Skin: warm, dry Neuro: grossly normal, moves all extremities Psych: Normal affect and thought content  Assessment/Plan:  Bilateral leg numbness Patient with several multiple vague complaints today, but this seems to be his main concern. No red flag signs or symptoms. Serotonin syndrome is a consideration given that he is on tramadol and celexa, however this seems to be less likely as he has been on this for awhile and tramadol seems to help his symptoms. His symptoms are likely related to anxiety, however he does have risk factors for electrolyte disturbances or nerve root irritation. Will rule out metabolic disorders today. Will check CBC, CMET, B12, Folate, B12, and TSH. IF unremarkable, would consider starting low dose  gabapentin and obtaining low back imaging. Return precautions reviewed. Follow up as needed.   Katina Degree. Jimmey Ralph, MD [facility] Family Medicine Resident PGY-3 03/14/2017 3:19 PM

## 2017-03-14 NOTE — Assessment & Plan Note (Signed)
Patient with several multiple vague complaints today, but this seems to be his main concern. No red flag signs or symptoms. Serotonin syndrome is a consideration given that he is on tramadol and celexa, however this seems to be less likely as he has been on this for awhile and tramadol seems to help his symptoms. His symptoms are likely related to anxiety, however he does have risk factors for electrolyte disturbances or nerve root irritation. Will rule out metabolic disorders today. Will check CBC, CMET, B12, Folate, B12, and TSH. IF unremarkable, would consider starting low dose gabapentin and obtaining low back imaging. Return precautions reviewed. Follow up as needed.

## 2017-03-14 NOTE — Patient Instructions (Signed)
We will check your blood and urine today.  If your symptoms get worse please come back soon.  Take care,  Dr Jimmey Ralph

## 2017-03-15 ENCOUNTER — Telehealth: Payer: Self-pay | Admitting: Family Medicine

## 2017-03-15 LAB — CK: Total CK: 99 U/L (ref 24–204)

## 2017-03-15 LAB — CMP14+EGFR
A/G RATIO: 1.7 (ref 1.2–2.2)
ALT: 20 IU/L (ref 0–44)
AST: 18 IU/L (ref 0–40)
Albumin: 4.1 g/dL (ref 3.5–5.5)
Alkaline Phosphatase: 89 IU/L (ref 39–117)
BILIRUBIN TOTAL: 0.3 mg/dL (ref 0.0–1.2)
BUN/Creatinine Ratio: 15 (ref 9–20)
BUN: 10 mg/dL (ref 6–24)
CALCIUM: 9.2 mg/dL (ref 8.7–10.2)
CO2: 25 mmol/L (ref 20–29)
Chloride: 98 mmol/L (ref 96–106)
Creatinine, Ser: 0.66 mg/dL — ABNORMAL LOW (ref 0.76–1.27)
GFR calc Af Amer: 129 mL/min/{1.73_m2} (ref >59)
GFR, EST NON AFRICAN AMERICAN: 112 mL/min/{1.73_m2} (ref >59)
GLOBULIN, TOTAL: 2.4 g/dL (ref 1.5–4.5)
Glucose: 81 mg/dL (ref 65–99)
POTASSIUM: 3.6 mmol/L (ref 3.5–5.2)
SODIUM: 139 mmol/L (ref 134–144)
Total Protein: 6.5 g/dL (ref 6.0–8.5)

## 2017-03-15 LAB — CBC
HEMATOCRIT: 46.4 % (ref 37.5–51.0)
Hemoglobin: 15.9 g/dL (ref 13.0–17.7)
MCH: 30.1 pg (ref 26.6–33.0)
MCHC: 34.3 g/dL (ref 31.5–35.7)
MCV: 88 fL (ref 79–97)
Platelets: 265 10*3/uL (ref 150–379)
RBC: 5.28 x10E6/uL (ref 4.14–5.80)
RDW: 13.9 % (ref 12.3–15.4)
WBC: 9.4 10*3/uL (ref 3.4–10.8)

## 2017-03-15 LAB — FOLATE: Folate: 11.1 ng/mL (ref >3.0)

## 2017-03-15 LAB — TSH: TSH: 1.97 u[IU]/mL (ref 0.450–4.500)

## 2017-03-15 LAB — VITAMIN B12: VITAMIN B 12: 214 pg/mL — AB (ref 232–1245)

## 2017-03-15 MED ORDER — VITAMIN B-12 1000 MCG PO TABS: 1000.0000 ug | ORAL_TABLET | Freq: Every day | ORAL | 11 refills | Status: DC

## 2017-03-15 NOTE — Telephone Encounter (Signed)
Called patient to discuss lab results. B12 low. Discussed replacement options. Patient opted for oral replacement. Rx sent in to pharmacy. Advised follow up within the next month to recheck values to ensure it is being absorbed properly.  Katina Degree. Jimmey Ralph, MD [facility] Family Medicine Resident PGY-3 03/15/2017 2:07 PM

## 2017-03-16 ENCOUNTER — Ambulatory Visit: Payer: Self-pay | Admitting: Family Medicine

## 2017-04-14 ENCOUNTER — Encounter: Payer: Self-pay | Admitting: Family Medicine

## 2017-04-14 ENCOUNTER — Ambulatory Visit (INDEPENDENT_AMBULATORY_CARE_PROVIDER_SITE_OTHER): Payer: Self-pay | Admitting: Family Medicine

## 2017-04-14 VITALS — BP 120/90 | HR 100 | Temp 98.4°F | Ht 71.0 in | Wt 267.6 lb

## 2017-04-14 DIAGNOSIS — G8929 Other chronic pain: Secondary | ICD-10-CM

## 2017-04-14 DIAGNOSIS — M549 Dorsalgia, unspecified: Secondary | ICD-10-CM

## 2017-04-14 DIAGNOSIS — G894 Chronic pain syndrome: Secondary | ICD-10-CM

## 2017-04-14 DIAGNOSIS — Z1211 Encounter for screening for malignant neoplasm of colon: Secondary | ICD-10-CM

## 2017-04-14 MED ORDER — TRAMADOL HCL 50 MG PO TABS: 50.0000 mg | ORAL_TABLET | Freq: Three times a day (TID) | ORAL | 3 refills | Status: DC | PRN

## 2017-04-14 NOTE — Progress Notes (Signed)
    Subjective:  Trevor Thomas is a 51 y.o. male who presents to the [facility] today for medication refills  HPI:  Chronic pain management: - reporting ongoing right upper shoulder pain and mid thoracic pain - taking tramadol 50mg  q6hrs - recommended by Dr. Caroleen Hamman to call pain clinic to inquire about insurance. Called and was unclear about coverage. Asking for referral today  ROS: denies numbness, tingling, weakness, fevers, chills, loss of bowel or bladder control  Headaches:   - had two days of on and off again headaches with nausea, vomiting and blurry vision.   - no symptoms today - no history of migraine, trauma - continues to smoke cigarettes - lipid panel at upper end of normal - needs repeat hemoglobin A1C - blood pressure WNL  ROS: currently denies nausea, vomiting, changes in vision, CP, SOB, weakness   PMH: HTN, chronic pain syndrome, opioid dependence Tobacco use: current smoker Medication: reviewed and updated ROS: see HPI   Objective:  Physical Exam: BP 120/90   Pulse 100   Temp 98.4 F (36.9 C) (Oral)   Ht 5\' 11"  (1.803 m)   Wt 267 lb 9.6 oz (121.4 kg)   SpO2 94%   BMI 37.32 kg/m   Gen: 51yo M in NAD, resting comfortably CV: RRR with no murmurs appreciated Pulm: NWOB, CTAB with no crackles, minimal expiratory wheezing throughout MSK: TTP at sternocleidomastoid and trapezius bilaterally, as well as thoracic paraspinal muscles on R side. FROM, with no edema or joint tenderness.  Skin: warm, dry Neuro: CN 2-12 WNL, no changes in sensation, strength 5/5 throughout, no FND Psych: Normal affect and thought content  No results found for this or any previous visit (from the past 72 hour(s)).   Assessment/Plan:  Chronic pain syndrome As documented by Dr. Alanda Slim 8 months ago the patient was seen at [facility] orthopedics about this past July and was recommended cervical facet joint block of C2-3 and C3-4 with possible surgery down the road. He declined the  procedure, as he was nervous.  He claims to be poorly controlled with current regimen of tramadol and is asking to be referred to a pain clinic.  - refilled tramadol - referred to pain clinic - will encourage patient to have nerve block at next visit  Headaches with red flags:  Discussed seriousness with patient regarding is symptoms.  He seemed to be concerned, however did not call 911 because he did not want to "bother" his landlord.  Patient understands red flags, as mentioned above and assured me that he will go to emergency department if he has headache with changes in vision or vomiting.  - follow up as needed.  - will need repeat A1C at next visit and LDL   Tobacco use:  - encouraged patient to quit smoking. States he is not currently ready.   Healthcare maintenance:  -Due for colonoscopy. Placed referral.

## 2017-04-14 NOTE — Assessment & Plan Note (Signed)
As documented by Dr. Alanda Slim 8 months ago the patient was seen at [facility] orthopedics about this past July and was recommended cervical facet joint block of C2-3 and C3-4 with possible surgery down the road. He declined the procedure, as he was nervous.  He claims to be poorly controlled with current regimen of tramadol and is asking to be referred to a pain clinic.  - refilled tramadol - referred to pain clinic - will encourage patient to have nerve block at next visit

## 2017-04-14 NOTE — Patient Instructions (Addendum)
Trevor Thomas, you were seen today for medication follow up and to discuss ongoing headaches over the past few days.   I have refilled your tramadol today.  If you do develop headaches with changes in vision, weakness or vomiting you need to go to the emergency department.   I will also place a referral to the pain clinic for you.   You are also due for a colonoscopy and I have placed a referral for you.   Very nice to see you, Trevor Thomas L. Myrtie Soman, MD [facility] Family Medicine Resident PGY-2 04/14/2017 5:12 PM

## 2017-05-02 ENCOUNTER — Ambulatory Visit (INDEPENDENT_AMBULATORY_CARE_PROVIDER_SITE_OTHER): Payer: Self-pay | Admitting: Physician Assistant

## 2017-05-04 ENCOUNTER — Other Ambulatory Visit: Payer: Self-pay | Admitting: *Deleted

## 2017-05-04 DIAGNOSIS — G8929 Other chronic pain: Secondary | ICD-10-CM

## 2017-05-04 DIAGNOSIS — M549 Dorsalgia, unspecified: Principal | ICD-10-CM

## 2017-05-05 MED ORDER — TRAMADOL HCL 50 MG PO TABS: 50.0000 mg | ORAL_TABLET | Freq: Three times a day (TID) | ORAL | 3 refills | Status: DC | PRN

## 2017-05-22 ENCOUNTER — Other Ambulatory Visit: Payer: Self-pay

## 2017-05-25 ENCOUNTER — Other Ambulatory Visit: Payer: Self-pay

## 2017-06-19 ENCOUNTER — Encounter: Payer: Self-pay | Admitting: Family Medicine

## 2017-06-21 ENCOUNTER — Telehealth: Payer: Self-pay | Admitting: Family Medicine

## 2017-06-21 NOTE — Telephone Encounter (Signed)
Cannot afford to pay for tramadol at [facility]. Please switch it outpt pharmacy.  He has the orange card and can pay for it there

## 2017-06-22 ENCOUNTER — Other Ambulatory Visit: Payer: Self-pay | Admitting: *Deleted

## 2017-06-22 DIAGNOSIS — F418 Other specified anxiety disorders: Secondary | ICD-10-CM

## 2017-06-22 MED FILL — traMADol HCL 50 MG TABS: 50 | 30 days supply | Qty: 90 | Fill #0

## 2017-06-30 ENCOUNTER — Ambulatory Visit (INDEPENDENT_AMBULATORY_CARE_PROVIDER_SITE_OTHER): Payer: Self-pay | Admitting: Family Medicine

## 2017-06-30 ENCOUNTER — Encounter: Payer: Self-pay | Admitting: Family Medicine

## 2017-06-30 DIAGNOSIS — G894 Chronic pain syndrome: Secondary | ICD-10-CM

## 2017-06-30 DIAGNOSIS — G8929 Other chronic pain: Secondary | ICD-10-CM

## 2017-06-30 DIAGNOSIS — M549 Dorsalgia, unspecified: Secondary | ICD-10-CM

## 2017-06-30 MED ORDER — TRAMADOL HCL 50 MG PO TABS: ORAL_TABLET | ORAL | 0 refills | Status: DC

## 2017-06-30 MED ORDER — GABAPENTIN 300 MG PO CAPS: ORAL_CAPSULE | ORAL | 6 refills | Status: DC

## 2017-06-30 NOTE — Patient Instructions (Signed)
[name], you were seen today for ongoing pain in your legs and neck and arms. You fortunately have no weakness or changes in sensation.  Because your tramadol is no longer working, I will be giving you some pills to taper off of it.  Take 3 pills daily for 3 days and then 2 pills daily for 3 days and then 1 pill daily for the remaining pills.   I am prescribing you gabapentin 300mg  to take 3 times a day (900mg  total) for the first week.  Then add one pill each week (ex: 2nd week 4 times a day (1200mg  total)).  Your goal will be to get to 2400mg .    Please follow up with me in 6 weeks and we can reassess your pain.  Nice to see you today, Trevor Thomas. Myrtie Soman, MD [facility] Family Medicine Resident PGY-2 06/30/2017 10:53 AM

## 2017-06-30 NOTE — Progress Notes (Signed)
    Subjective:  Trevor Thomas is a 51 y.o. male who presents to the [facility] today with a chief complaint of pain.  HPI:  Chronic pain: Patient presents today for a visit for ongoing pain. He has history of restless leg syndrome, narcotic dependence and chronic pain syndrome. Reports that his pain is in his arms, legs and neck and upper back. Denies any numbness, tingling or weakness in his extremities, fevers, chills or loss of bowel or bladder control.   PMH: see above Tobacco use: yes Medication: reviewed and updated ROS: see HPI   Objective:  Physical Exam: BP (!) 160/100 (BP Location: Right Arm, Patient Position: Sitting, Cuff Size: Large)   Pulse 96   Temp 98.3 F (36.8 C) (Oral)   Wt 269 lb (122 kg)   SpO2 95%   BMI 37.52 kg/m   Gen: 51yo M in NAD, resting comfortably CV: RRR with no murmurs appreciated Pulm: NWOB, CTAB with no crackles, wheezes, or rhonchi GI: Normal bowel sounds present. Soft, Nontender, Nondistended. MSK: no gross deformities or edema. Has full AROM of cervical spine with some discomfort particularly with side flexion. Does have TTP in bilateral sternocleidomastoids and trapezius bilaterally. No spinal tenderness, does have diffuse paraspinal tenderness. Negative straight leg raise bilaterally.  Skin: warm, dry Neuro: CN 2-12 WNL, sensation intact throughout and 5/5 strength throughout Psych: Normal affect and thought content  No results found for this or any previous visit (from the past 72 hour(s)).   Assessment/Plan:  Chronic pain syndrome Patient with complex chronic pain syndrome with narcotic dependence and restless leg syndrome. Currently on tramadol for pain. Reports that he has been taking THREE 50mg  tablets 4 times daily. I discussed with him that he is taking way too much of this medication. He also reports that it is not helping. We discussed weaning him off and I provided him with a taper. I will begin him on gabapentin 300mg  TID and we will  increase by 300mg  weekly with a goal of 2400mg  (the upper limit for RLS). Will have him follow up in 6 weeks an we can increase up to 3600mg  if needed.   Alura Olveda L. Myrtie Soman, MD [facility] Family Medicine Resident PGY-2 07/04/2017 10:29 AM

## 2017-07-04 NOTE — Assessment & Plan Note (Signed)
Patient with complex chronic pain syndrome with narcotic dependence and restless leg syndrome. Currently on tramadol for pain. Reports that he has been taking THREE 50mg  tablets 4 times daily. I discussed with him that he is taking way too much of this medication. He also reports that it is not helping. We discussed weaning him off and I provided him with a taper. I will begin him on gabapentin 300mg  TID and we will increase by 300mg  weekly with a goal of 2400mg  (the upper limit for RLS). Will have him follow up in 6 weeks an we can increase up to 3600mg  if needed.

## 2017-07-19 MED FILL — traMADol HCL 50 MG TABS: 50 | 30 days supply | Qty: 90 | Fill #1

## 2017-08-17 MED FILL — traMADol HCL 50 MG TABS: 50 | 30 days supply | Qty: 90 | Fill #2

## 2017-09-15 MED FILL — traMADol HCL 50 MG TABS: 50 | 30 days supply | Qty: 90 | Fill #3

## 2017-09-19 ENCOUNTER — Ambulatory Visit: Payer: Self-pay

## 2017-09-22 ENCOUNTER — Telehealth: Payer: Self-pay | Admitting: Family Medicine

## 2017-09-22 NOTE — Telephone Encounter (Signed)
Wife is calling to check the prior authorization on her husband medication. Was it approved or are we still waiting on an answer from  The, Please call and update patient. jw

## 2017-09-28 ENCOUNTER — Ambulatory Visit: Payer: Self-pay

## 2017-09-29 NOTE — Telephone Encounter (Signed)
I did not see anything in my box as of 8:30AM 09/29/17.   Anne Sebring L. Myrtie Soman, MD [facility] Family Medicine Resident PGY-2 09/29/2017 8:36 AM

## 2017-09-29 NOTE — Telephone Encounter (Signed)
Tried to contact pt wife to inform her of below and number was currently not working.  If pt or wife calls back please inform them of below. Lamonte Sakai, April D, [name]

## 2017-10-04 ENCOUNTER — Ambulatory Visit: Payer: Self-pay

## 2017-10-04 ENCOUNTER — Ambulatory Visit (INDEPENDENT_AMBULATORY_CARE_PROVIDER_SITE_OTHER): Payer: Self-pay | Admitting: Family Medicine

## 2017-10-04 DIAGNOSIS — G894 Chronic pain syndrome: Secondary | ICD-10-CM

## 2017-10-04 NOTE — Progress Notes (Signed)
    Subjective:  Trevor Thomas is a 52 y.o. male who presents to the [facility] today for follow-up chronic pain right lower extremity spasm  HPI:  Patient is a 52 year old male with history of chronic pain presenting with new reports of right lower extremity spasm with pain radiating from right lower back to his ankle occasionally into his groin with ambulation.  He denies any weakness.  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  He denies any subjective weakness or new onset incontinence.  Patient was last seen by me on 06/30/2017 at that time was on tramadol for pain control and reported that he was taking 3 mg tablets 4 times daily and reported adverse side effects.  At that time he reported that he was not getting any relief from his tramadol and we agreed to a trial of gabapentin starting with 300 mg 3 times daily and tapering off of the tramadol.  Patient had apparently tried gabapentin for about a week before that he had symptoms including nausea and vomiting and stopped.  Of note patient reported that he had additional refills of tramadol available from me and continued this over the past few months and comes in today requesting additional refills of tramadol.   PMH: Chronic pain, polysubstance abuse Tobacco use: Current smoker Medication: reviewed and updated ROS: see HPI   Objective:  Physical Exam: BP (!) 150/120 (BP Location: Left Arm, Patient Position: Sitting, Cuff Size: Large)   Pulse 92   Temp 98.2 F (36.8 C) (Oral)   Ht 5\' 11"  (1.803 m)   Wt 272 lb 3.2 oz (123.5 kg)   SpO2 94%   BMI 37.96 kg/m   Gen: 52 year old male in NAD, resting comfortably CV: RRR with no murmurs appreciated Pulm: NWOB, CTAB with no crackles, wheezes, or rhonchi GI: Normal bowel sounds present. Soft, Nontender, Nondistended. MSK: No gross deformities or edema.  Hips with full active and passive range of motion.  He  has tenderness to palpation of the greater trochanters bilaterally and pain was elicited bilaterally with flexion abduction external rotation at the greater trochanters bilaterally.  There is no radiation of pain into the groin with these maneuvers.  He had no spinal tenderness but did have diffuse paraspinal tenderness.  Trunk with good range of motion and elicited minimal tenderness.  Skin: warm, dry Neuro: grossly normal, moves all extremities Psych: Normal affect and thought content  No results found for this or any previous visit (from the past 72 hour(s)).   Assessment/Plan:  Chronic pain syndrome Plan as of September was to start patient on gabapentin for pain relief and taper off of tramadol.  Without my knowledge patient had continued tramadol over the past few months and is requesting additional refills.  Due to his history of narcotic abuse, excessive use of the tramadol back in September and continuation of this medication despite a conversation I will not be refilling his tramadol.  Prior to discussing this I offered him duloxetine after learning that he has not been taking Celexa for at least a year.  After our discussion with the tramadol he walked out without any words.  I will be happy to see this patient in the future and discuss other options for pain control.   Ranyia Witting L. Myrtie Soman, MD [facility] Family Medicine Resident PGY-2 10/06/2017 9:16 AM

## 2017-10-05 ENCOUNTER — Ambulatory Visit: Payer: Self-pay

## 2017-10-06 ENCOUNTER — Encounter: Payer: Self-pay | Admitting: Family Medicine

## 2017-10-06 NOTE — Telephone Encounter (Signed)
Pt was seen in office on 10/04/17. Trevor Thomas, Trevor Thomas, [name]

## 2017-10-06 NOTE — Patient Instructions (Signed)
Have discussing that I will not be refilling his tramadol patient left the exam room without saying a word.  We will be happy to see patient in the future to discuss options for pain control aside from narcotics.

## 2017-10-06 NOTE — Assessment & Plan Note (Signed)
Plan as of September was to start patient on gabapentin for pain relief and taper off of tramadol.  Without my knowledge patient had continued tramadol over the past few months and is requesting additional refills.  Due to his history of narcotic abuse, excessive use of the tramadol back in September and continuation of this medication despite a conversation I will not be refilling his tramadol.  Prior to discussing this I offered him duloxetine after learning that he has not been taking Celexa for at least a year.  After our discussion with the tramadol he walked out without any words.  I will be happy to see this patient in the future and discuss other options for pain control.

## 2017-11-27 ENCOUNTER — Emergency Department (HOSPITAL_COMMUNITY): Admission: EM | Admit: 2017-11-27 | Discharge: 2017-11-27 | Discharge status: Against Medical Advice | Payer: Self-pay

## 2017-11-27 ENCOUNTER — Other Ambulatory Visit: Payer: Self-pay

## 2017-11-27 NOTE — ED Notes (Signed)
Called pt's name for triage, no answer.

## 2018-01-24 ENCOUNTER — Emergency Department (HOSPITAL_COMMUNITY): Payer: Self-pay

## 2018-01-24 ENCOUNTER — Other Ambulatory Visit: Payer: Self-pay

## 2018-01-24 ENCOUNTER — Emergency Department (HOSPITAL_COMMUNITY)
Admission: EM | Admit: 2018-01-24 | Discharge: 2018-01-24 | Disposition: A | Discharge status: Home / Self Care | Payer: Self-pay | Attending: Emergency Medicine | Admitting: Emergency Medicine

## 2018-01-24 ENCOUNTER — Encounter (HOSPITAL_COMMUNITY): Payer: Self-pay

## 2018-01-24 DIAGNOSIS — M5030 Other cervical disc degeneration, unspecified cervical region: Secondary | ICD-10-CM

## 2018-01-24 DIAGNOSIS — M5136 Other intervertebral disc degeneration, lumbar region: Secondary | ICD-10-CM | POA: Insufficient documentation

## 2018-01-24 DIAGNOSIS — Z79899 Other long term (current) drug therapy: Secondary | ICD-10-CM | POA: Insufficient documentation

## 2018-01-24 DIAGNOSIS — F17210 Nicotine dependence, cigarettes, uncomplicated: Secondary | ICD-10-CM | POA: Insufficient documentation

## 2018-01-24 DIAGNOSIS — I10 Essential (primary) hypertension: Secondary | ICD-10-CM | POA: Insufficient documentation

## 2018-01-24 DIAGNOSIS — J449 Chronic obstructive pulmonary disease, unspecified: Secondary | ICD-10-CM | POA: Insufficient documentation

## 2018-01-24 LAB — BASIC METABOLIC PANEL
Anion gap: 9 (ref 5–15)
BUN: 5 mg/dL — ABNORMAL LOW (ref 6–20)
CHLORIDE: 102 mmol/L (ref 101–111)
CO2: 26 mmol/L (ref 22–32)
Calcium: 9 mg/dL (ref 8.9–10.3)
Creatinine, Ser: 0.75 mg/dL (ref 0.61–1.24)
GFR calc Af Amer: >60 mL/min (ref >60)
GLUCOSE: 144 mg/dL — AB (ref 65–99)
POTASSIUM: 3.5 mmol/L (ref 3.5–5.1)
Sodium: 137 mmol/L (ref 135–145)

## 2018-01-24 LAB — I-STAT TROPONIN, ED
Troponin i, poc: 0 ng/mL (ref 0.00–0.08)
Troponin i, poc: 0 ng/mL (ref 0.00–0.08)

## 2018-01-24 LAB — CBC
HEMATOCRIT: 48.9 % (ref 39.0–52.0)
HEMOGLOBIN: 16.7 g/dL (ref 13.0–17.0)
MCH: 30.9 pg (ref 26.0–34.0)
MCHC: 34.2 g/dL (ref 30.0–36.0)
MCV: 90.4 fL (ref 78.0–100.0)
Platelets: 221 10*3/uL (ref 150–400)
RBC: 5.41 MIL/uL (ref 4.22–5.81)
RDW: 12.6 % (ref 11.5–15.5)
WBC: 6.8 10*3/uL (ref 4.0–10.5)

## 2018-01-24 MED ORDER — TRAMADOL HCL 50 MG PO TABS: 50.0000 mg | ORAL_TABLET | Freq: Four times a day (QID) | ORAL | 0 refills | Status: DC | PRN

## 2018-01-24 MED ORDER — PREDNISONE 20 MG PO TABS
60.0000 mg | ORAL_TABLET | Freq: Once | ORAL | Status: AC
  Administered 2018-01-24: 60 mg via ORAL
  Filled 2018-01-24: qty 3

## 2018-01-24 MED ORDER — METHYLPREDNISOLONE 4 MG PO TBPK: ORAL_TABLET | ORAL | 0 refills | Status: DC

## 2018-01-24 MED ORDER — METHOCARBAMOL 500 MG PO TABS
500.0000 mg | ORAL_TABLET | Freq: Once | ORAL | Status: AC
  Administered 2018-01-24: 500 mg via ORAL
  Filled 2018-01-24: qty 1

## 2018-01-24 MED ORDER — MELOXICAM 15 MG PO TABS: 15.0000 mg | ORAL_TABLET | Freq: Every day | ORAL | 0 refills | Status: AC

## 2018-01-24 MED ORDER — OXYCODONE-ACETAMINOPHEN 5-325 MG PO TABS
2.0000 | ORAL_TABLET | Freq: Once | ORAL | Status: AC
  Administered 2018-01-24: 2 via ORAL
  Filled 2018-01-24: qty 2

## 2018-01-24 NOTE — ED Notes (Signed)
Pt ambulated to the restroom with no problems. Pt had not facial grimacing or moans while walking.

## 2018-01-24 NOTE — ED Provider Notes (Signed)
[facility] EMERGENCY DEPARTMENT Provider Note   CSN: [id number] Arrival date & time: 01/24/18  1211     History   Chief Complaint Chief Complaint  Patient presents with  . Shortness of Breath    HPI Trevor Thomas is a 52 y.o. male.  HPI 52 year old male with past medical history as below here with multiple complaints.  The patient states his primary complaint is progressive worsening lower back pain.  He has known, chronic pain in his neck and back due to degenerative disease.  He has been seen previously and was recently discontinued off of his opiates several months ago by the resident clinic.  He reports that he has had progressively worsening aching, throbbing, severe, lower back pain.  He has occasional sharp, stabbing, shooting pains that radiate down his bilateral legs.  These caused him to fall when they are severe.  He strongly denies any loss of bowel or bladder function.  Denies any incontinence.  He reports that his pain is progressively worsened so he presented for evaluation.  While in the waiting room, he reports that he has a history of anxiety and he began to feel very anxious.  He began to feel short of breath as well with chest pressure.  This is very common for him.  He reportedly told this to the nurse, who obtained screening lab work.  He denies any chest pain or shortness of breath currently.  No fevers or chills.  His pain is worse with any kind of movement or palpation.  No alleviating factors.  He does not take any analgesics now.  Past Medical History:  Diagnosis Date  . 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  Secondary to eye surgery  . Anxiety   . Arthritis   . Asthma   . Bronchitis    hx of  . Concussion 1983  . COPD (chronic obstructive pulmonary disease) (HCC)   . Depression   . GERD (gastroesophageal reflux disease)    takes tums/rolaids  . Head trauma   . Headache(784.0)   . Hypertension   . Umbilical hernia      Patient Active Problem List   Diagnosis Date Noted  . Bilateral leg numbness 03/14/2017  . Syncope 06/16/2016  . Chronic pain syndrome   . Pain of left hand   . Left hand weakness 05/22/2015  . Swelling of left hand 05/22/2015  . Uncomplicated opioid dependence (HCC)   . Narcotic dependence (HCC) 01/30/2015  . Acute narcotic withdrawal (HCC) 01/30/2015  . Sebaceous cyst 01/23/2015  . Encounter for chronic pain management 10/28/2014  . Constipation 10/28/2014  . Anemia, iron deficiency 09/02/2014  . Numbness and tingling in left arm 09/02/2014  . Gastric ulcer with hemorrhage 08/25/2014  . Bleeding gastrointestinal   . Internal nasal lesion 06/04/2014  . Knee pain 05/19/2014  . Allergy to bee sting 04/08/2014  . Anxiety associated with depression 04/08/2014  . Restless leg syndrome 10/22/2013  . Chronic back pain 05/01/2013  . Tobacco abuse 09/11/2012  . COPD (chronic obstructive pulmonary disease) (HCC) 09/11/2012  . Low back pain radiating to left leg 08/10/2012  . Brachial neuritis or radiculitis 12/07/2010  . Essential hypertension 10/26/2010  . ALLERGIC RHINITIS 10/26/2010    Past Surgical History:  Procedure Laterality Date  . ESOPHAGOGASTRODUODENOSCOPY N/A 08/25/2014   Procedure: ESOPHAGOGASTRODUODENOSCOPY (EGD);  Surgeon: Louis Meckel, MD;  Location: [facility] ENDOSCOPY;  Service: Endoscopy;  Laterality: N/A;  . EYE SURGERY  20 years ago  Metal foreign body removed from Right eye  . HERNIA REPAIR  05/2012   umbilical hernia  . KNEE SURGERY    . thumb surgery (left)    . UMBILICAL HERNIA REPAIR  05/15/2012   Procedure: HERNIA REPAIR UMBILICAL ADULT;  Surgeon: Clovis Pu. Cornett, MD;  Location: [facility] OR;  Service: General;  Laterality: N/A;        Home Medications    Prior to Admission medications   Medication Sig Start Date End Date Taking? Authorizing Provider  albuterol (PROVENTIL HFA;VENTOLIN HFA) 108 (90 Base) MCG/ACT inhaler Inhale 2 puffs into the lungs  every 6 (six) hours as needed for wheezing. 01/30/17   Renne Musca, MD  citalopram (CELEXA) 40 MG tablet Take 1 tablet (40 mg total) by mouth daily. 12/22/16   Rumley, Lora Havens, DO  gabapentin (NEURONTIN) 300 MG capsule Take 1 pill three times daily (900mg  total) for the next week and increase by 300mg  weekly until you reach 2400mg  total. 06/30/17   Renne Musca, MD  lisinopril-hydrochlorothiazide (PRINZIDE,ZESTORETIC) 20-25 MG tablet Take 1 tablet by mouth every morning. 12/22/16   Rumley, Lora Havens, DO  meloxicam (MOBIC) 15 MG tablet Take 1 tablet (15 mg total) by mouth daily for 10 days. 01/24/18 02/03/18  Shaune Pollack, MD  methylPREDNISolone (MEDROL DOSEPAK) 4 MG TBPK tablet Take as directed on packaging 01/24/18   Shaune Pollack, MD  traMADol (ULTRAM) 50 MG tablet Take 1 tablet (50 mg total) by mouth every 6 (six) hours as needed for severe pain. 01/24/18   Shaune Pollack, MD  vitamin B-12 (CYANOCOBALAMIN) 1000 MCG tablet Take 1 tablet (1,000 mcg total) by mouth daily. 03/15/17   Ardith Dark, MD    Family History Family History  Problem Relation Age of Onset  . Diabetes Mother   . Diabetes Father   . Heart failure Brother     Social History Social History   Tobacco Use  . Smoking status: Current Every Day Smoker    Packs/day: 1.00    Years: 20.00    Pack years: 20.00    Types: Cigarettes  . Smokeless tobacco: Never Used  Substance Use Topics  . Alcohol use: No    Alcohol/week: 0.0 oz    Comment: rarely  - quit approx. June 2013  . Drug use: Yes    Types: Marijuana    Comment: occasionally - once every couple of months.     Allergies   Bee venom and Clonidine derivatives   Review of Systems Review of Systems  Constitutional: Negative for chills, fatigue and fever.  HENT: Negative for congestion and rhinorrhea.   Eyes: Negative for visual disturbance.  Respiratory: Negative for cough, shortness of breath and wheezing.   Cardiovascular: Negative for chest  pain and leg swelling.  Gastrointestinal: Negative for abdominal pain, diarrhea, nausea and vomiting.  Genitourinary: Negative for dysuria and flank pain.  Musculoskeletal: Positive for arthralgias, back pain and neck pain. Negative for neck stiffness.  Skin: Negative for rash and wound.  Allergic/Immunologic: Negative for immunocompromised state.  Neurological: Positive for numbness. Negative for syncope, weakness and headaches.  All other systems reviewed and are negative.    Physical Exam Updated Vital Signs BP 136/85   Pulse 83   Temp 98.4 F (36.9 C) (Oral)   Resp 17   SpO2 (!) 89%   Physical Exam  Constitutional: He is oriented to person, place, and time. He appears well-developed and well-nourished. No distress.  HENT:  Head: Normocephalic and atraumatic.  Eyes: Conjunctivae are normal.  Neck: Neck supple.  Cardiovascular: Normal rate, regular rhythm and normal heart sounds. Exam reveals no friction rub.  No murmur heard. Pulmonary/Chest: Effort normal and breath sounds normal. No respiratory distress. He has no wheezes. He has no rales.  Abdominal: Soft. He exhibits no distension.  Musculoskeletal: He exhibits no edema.  Neurological: He is alert and oriented to person, place, and time. He exhibits normal muscle tone.  Skin: Skin is warm. Capillary refill takes less than 2 seconds.  Psychiatric: He has a normal mood and affect.  Nursing note and vitals reviewed.  Spine Exam: Inspection/Palpation: Moderate TTP throughout entire C,L spine. No deformity. No step-offs. Strength: 5/5 throughout UE and LE bilaterally (hip flexion/extension, adduction/abduction; knee flexion/extension; foot dorsiflexion/plantarflexion, inversion/eversion; great toe inversion) Sensation: Intact to light touch in proximal and distal LE bilaterally Reflexes: 2+ quadriceps and achilles reflexes  ED Treatments / Results  Labs (all labs ordered are listed, but only abnormal results are  displayed) Labs Reviewed  BASIC METABOLIC PANEL - Abnormal; Notable for the following components:      Result Value   Glucose, Bld 144 (*)    BUN 5 (*)    All other components within normal limits  CBC  I-STAT TROPONIN, ED  I-STAT TROPONIN, ED  I-STAT TROPONIN, ED    EKG EKG Interpretation  Date/Time:  Wednesday January 24 2018 12:21:06 EDT Ventricular Rate:  110 PR Interval:  150 QRS Duration: 100 QT Interval:  350 QTC Calculation: 473 R Axis:   52 Text Interpretation:  Sinus tachycardia Possible Left atrial enlargement Borderline ECG Since last EKG, heart rate has increased Confirmed by Shaune Pollack [phone]) on 01/24/2018 5:27:58 PM   Radiology Dg Chest 2 View  Result Date: 01/24/2018 CLINICAL DATA:  Shortness of breath for 2 days EXAM: CHEST - 2 VIEW COMPARISON:  06/16/2016 FINDINGS: Cardiac shadow is within normal limits. The lungs are well aerated bilaterally. No focal infiltrate or sizable effusion is seen. No acute bony abnormality is noted. IMPRESSION: No acute abnormality noted.  No change from the prior exam. Electronically Signed   By: Alcide Clever M.D.   On: 01/24/2018 13:25   Dg Cervical Spine 2-3 Views  Result Date: 01/24/2018 CLINICAL DATA:  Neck pain for the past few weeks. EXAM: CERVICAL SPINE - 2-3 VIEW COMPARISON:  CT cervical spine 01/27/2016 FINDINGS: There is no evidence of cervical spine fracture or prevertebral soft tissue swelling. Intact atlantodental interval. No splaying of the lateral masses of C1 on C2. Odontoid process appears intact. Slight degenerative disc flattening at C5-6 and C6-7. Redemonstration of right-sided C3-4 facet arthropathy with joint space narrowing sclerosis. Lesser degree of facet arthropathy at C4-5. IMPRESSION: No acute cervical spine fracture or listhesis. Right upper cervical facet arthropathy from C3 through C5. Degenerative disc disease C5-6 and C6-7. Electronically Signed   By: Tollie Eth M.D.   On: 01/24/2018 18:50   Dg  Lumbar Spine Complete  Result Date: 01/24/2018 CLINICAL DATA:  Lower back pain for the past few weeks. EXAM: LUMBAR SPINE - COMPLETE 4+ VIEW COMPARISON:  03/08/2015 FINDINGS: Chronic mild anterior wedging of L1. No acute fracture of the lumbar spine. No suspicious osseous lesions. Pars defects of L5 without listhesis. No significant disc flattening. Partially visualized aorta with atherosclerosis. No definite aneurysm. 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 Chronic mild anterior compression of L1 unchanged. Electronically Signed   By: Tollie Eth M.D.   On: 01/24/2018 18:46    Procedures Procedures (including  critical care time)  Medications Ordered in ED Medications  oxyCODONE-acetaminophen (PERCOCET/ROXICET) 5-325 MG per tablet 2 tablet (2 tablets Oral Given 01/24/18 1810)  predniSONE (DELTASONE) tablet 60 mg (60 mg Oral Given 01/24/18 1810)  methocarbamol (ROBAXIN) tablet 500 mg (500 mg Oral Given 01/24/18 1810)     Initial Impression / Assessment and Plan / ED Course  I have reviewed the triage vital signs and the nursing notes.  Pertinent labs & imaging results that were available during my care of the patient were reviewed by me and considered in my medical decision making (see chart for details).    52 yo M here with diffuse spine pain. This is a chronic, ongoing issue for him. He reports radicular sx down b/l legs, as well as arms occasionally, which have been documented for >1 year. He strongly denies any LE weakness, numbness, loss of bowel or bladder fxn, or signs of cauda equina. He has no fever, chills, h/o IVDU, or risk factors for osteo, epidural abscess. No other red flags. Plain films are c/w degenerative disease which is c/w his history. Will give prednisone for sciatica/radicular sx, analgesia (reviewed  database), and d/c.  Regarding his CP, this was during what sounds like a panic attack. This is also a chronic issue. Trop neg x 2, EKG non-ischemic, CXR  clear , doubt ACS, PE, dissection. Resolved with reassurance. Not related to his pain in his back which is chronic and do not suspect dissection.  Final Clinical Impressions(s) / ED Diagnoses   Final diagnoses:  Degenerative disc disease, cervical  Degenerative disc disease, lumbar    ED Discharge Orders        Ordered    methylPREDNISolone (MEDROL DOSEPAK) 4 MG TBPK tablet     01/24/18 2023    meloxicam (MOBIC) 15 MG tablet  Daily     01/24/18 2023    traMADol (ULTRAM) 50 MG tablet  Every 6 hours PRN     01/24/18 2023       Shaune Pollack, MD 01/24/18 2327

## 2018-01-24 NOTE — ED Notes (Signed)
Pt reports his pain had decreased but is now coming back however not as bad as it initially was.

## 2018-01-24 NOTE — Discharge Instructions (Addendum)
Your X Rays today show that you have degenerative disc disease. The steroids and Mobic should help.

## 2018-01-24 NOTE — ED Notes (Signed)
Patient transported to X-ray 

## 2018-01-24 NOTE — ED Triage Notes (Signed)
Pt presents for evaluation of sob, hx of copd. Diaphoresis on exam. Denies cp.

## 2018-01-24 NOTE — ED Notes (Signed)
Results reviewed.  No changes in acuity at this time 

## 2018-01-25 MED FILL — traMADol HCL 50 MG TABS: 50 | 4 days supply | Qty: 15 | Fill #0

## 2018-03-03 ENCOUNTER — Other Ambulatory Visit: Payer: Self-pay

## 2018-03-03 ENCOUNTER — Encounter (HOSPITAL_COMMUNITY): Payer: Self-pay

## 2018-03-03 ENCOUNTER — Emergency Department (HOSPITAL_COMMUNITY): Payer: Medicaid Other

## 2018-03-03 ENCOUNTER — Emergency Department (HOSPITAL_COMMUNITY)
Admission: EM | Admit: 2018-03-03 | Discharge: 2018-03-03 | Disposition: A | Discharge status: Home / Self Care | Payer: Medicaid Other | Attending: Emergency Medicine | Admitting: Emergency Medicine

## 2018-03-03 DIAGNOSIS — Z5321 Procedure and treatment not carried out due to patient leaving prior to being seen by health care provider: Secondary | ICD-10-CM | POA: Insufficient documentation

## 2018-03-03 DIAGNOSIS — R079 Chest pain, unspecified: Secondary | ICD-10-CM | POA: Insufficient documentation

## 2018-03-03 LAB — CBC
HEMATOCRIT: 46 % (ref 39.0–52.0)
Hemoglobin: 15.3 g/dL (ref 13.0–17.0)
MCH: 30.2 pg (ref 26.0–34.0)
MCHC: 33.3 g/dL (ref 30.0–36.0)
MCV: 90.9 fL (ref 78.0–100.0)
PLATELETS: 195 10*3/uL (ref 150–400)
RBC: 5.06 MIL/uL (ref 4.22–5.81)
RDW: 12.6 % (ref 11.5–15.5)
WBC: 7.4 10*3/uL (ref 4.0–10.5)

## 2018-03-03 LAB — BASIC METABOLIC PANEL
Anion gap: 7 (ref 5–15)
BUN: 12 mg/dL (ref 6–20)
CO2: 30 mmol/L (ref 22–32)
CREATININE: 0.66 mg/dL (ref 0.61–1.24)
Calcium: 9.1 mg/dL (ref 8.9–10.3)
Chloride: 103 mmol/L (ref 101–111)
GFR calc Af Amer: >60 mL/min (ref >60)
GLUCOSE: 132 mg/dL — AB (ref 65–99)
POTASSIUM: 3.6 mmol/L (ref 3.5–5.1)
SODIUM: 140 mmol/L (ref 135–145)

## 2018-03-03 LAB — I-STAT TROPONIN, ED: Troponin i, poc: 0 ng/mL (ref 0.00–0.08)

## 2018-03-03 NOTE — ED Notes (Signed)
Bed: [facility]3 Expected date:  Expected time:  Means of arrival:  Comments: 

## 2018-03-03 NOTE — ED Triage Notes (Signed)
He c/o diminution of hearing and "clogged" bilat. Ears since yesterday. He c/o "stumbling about" today with balance issues , plus some chest discomfort with some shortness of breath coupled with a "panicky feeling". He is in no distress.

## 2018-03-05 NOTE — ED Notes (Signed)
Follow up call made  1110  03/05/18 s Julianna Vanwagner rn

## 2018-05-11 ENCOUNTER — Other Ambulatory Visit: Payer: Self-pay

## 2018-05-11 MED ORDER — LISINOPRIL-HYDROCHLOROTHIAZIDE 20-25 MG PO TABS: 1.0000 | ORAL_TABLET | Freq: Every morning | ORAL | 11 refills | Status: DC

## 2018-06-13 ENCOUNTER — Encounter (HOSPITAL_COMMUNITY): Payer: Self-pay | Admitting: Emergency Medicine

## 2018-06-13 ENCOUNTER — Emergency Department (HOSPITAL_COMMUNITY)
Admission: EM | Admit: 2018-06-13 | Discharge: 2018-06-13 | Disposition: A | Discharge status: Home / Self Care | Payer: Medicaid Other | Attending: Emergency Medicine | Admitting: Emergency Medicine

## 2018-06-13 DIAGNOSIS — M25552 Pain in left hip: Secondary | ICD-10-CM | POA: Diagnosis not present

## 2018-06-13 DIAGNOSIS — Z5321 Procedure and treatment not carried out due to patient leaving prior to being seen by health care provider: Secondary | ICD-10-CM | POA: Insufficient documentation

## 2018-06-13 DIAGNOSIS — M25559 Pain in unspecified hip: Secondary | ICD-10-CM

## 2018-06-13 LAB — COMPREHENSIVE METABOLIC PANEL
ALT: 19 U/L (ref 0–44)
AST: 20 U/L (ref 15–41)
Albumin: 3.8 g/dL (ref 3.5–5.0)
Alkaline Phosphatase: 91 U/L (ref 38–126)
Anion gap: 11 (ref 5–15)
BILIRUBIN TOTAL: 0.5 mg/dL (ref 0.3–1.2)
BUN: 7 mg/dL (ref 6–20)
CALCIUM: 9.7 mg/dL (ref 8.9–10.3)
CHLORIDE: 100 mmol/L (ref 98–111)
CO2: 28 mmol/L (ref 22–32)
CREATININE: 0.59 mg/dL — AB (ref 0.61–1.24)
GFR calc Af Amer: >60 mL/min (ref >60)
Glucose, Bld: 100 mg/dL — ABNORMAL HIGH (ref 70–99)
Potassium: 3.6 mmol/L (ref 3.5–5.1)
Sodium: 139 mmol/L (ref 135–145)
TOTAL PROTEIN: 7.2 g/dL (ref 6.5–8.1)

## 2018-06-13 LAB — CBC
HCT: 50.6 % (ref 39.0–52.0)
Hemoglobin: 17 g/dL (ref 13.0–17.0)
MCH: 29.9 pg (ref 26.0–34.0)
MCHC: 33.6 g/dL (ref 30.0–36.0)
MCV: 88.9 fL (ref 78.0–100.0)
PLATELETS: 284 10*3/uL (ref 150–400)
RBC: 5.69 MIL/uL (ref 4.22–5.81)
RDW: 12.2 % (ref 11.5–15.5)
WBC: 10.6 10*3/uL — AB (ref 4.0–10.5)

## 2018-06-13 LAB — LIPASE, BLOOD: Lipase: 29 U/L (ref 11–51)

## 2018-06-13 MED ORDER — KETOROLAC TROMETHAMINE 30 MG/ML IJ SOLN
30.0000 mg | Freq: Once | INTRAMUSCULAR | Status: AC
  Administered 2018-06-13: 30 mg via INTRAMUSCULAR
  Filled 2018-06-13: qty 1

## 2018-06-13 NOTE — ED Triage Notes (Signed)
Pt presents to ED after having a fall 2 days ago and now has left sided pain, but the worst is his left hip and groin.  Pt denies swelling to testicle, but states pain increasing with movement.  C/o vomiting every time he tries to eat, generalized abdominal pain, denies diarrhea.  Denies changes in urination.

## 2018-06-13 NOTE — ED Notes (Signed)
Pt left while I was in back at triage. Pt did not want to stay.

## 2018-06-13 NOTE — ED Notes (Signed)
Pt given UA cup and informed he needs to provide a UA. Pt verbalized understanding / does not have to go at this time.

## 2018-06-13 NOTE — ED Provider Notes (Cosign Needed)
Patient placed in Quick Look pathway, seen and evaluated   Chief Complaint: left hip pain  HPI:   Trevor Thomas is a 52 y.o. male who presents to the ED with left side pain. Patient reports that 4 days ago he tripped and fell. He reports that he felt a pop in his left hip and left arm. The pain in the left hip radiates to the left groin and testicle. Patient states that today he started having nausea and vomiting. Patient reports vomiting x 3 today and once yesterday.   ROS: GI: n/v, abdominal pain  M/S: left hip and left arm pain  GU: left testicular pain  Physical Exam:  BP (!) 165/115 (BP Location: Left Arm)   Pulse (!) 104   Temp 98.3 F (36.8 C) (Oral)   Resp 18   Ht 5\' 11"  (1.803 m)   Wt 113.4 kg   SpO2 98%   BMI 34.87 kg/m    Gen: No distress  Neuro: Awake and Alert  Skin: Warm and dry  M/S: left hip tender with plapation  Abdomen: soft, non tender  Initiation of care has begun. The patient has been counseled on the process, plan, and necessity for staying for the completion/evaluation, and the remainder of the medical screening examination    Janne Napoleon, NP 06/13/18 1609

## 2018-06-15 ENCOUNTER — Emergency Department (HOSPITAL_COMMUNITY): Payer: Medicaid Other

## 2018-06-15 ENCOUNTER — Encounter (HOSPITAL_COMMUNITY): Payer: Self-pay | Admitting: Emergency Medicine

## 2018-06-15 ENCOUNTER — Emergency Department (HOSPITAL_COMMUNITY)
Admission: EM | Admit: 2018-06-15 | Discharge: 2018-06-15 | Disposition: A | Discharge status: Home / Self Care | Payer: Medicaid Other | Attending: Emergency Medicine | Admitting: Emergency Medicine

## 2018-06-15 DIAGNOSIS — J449 Chronic obstructive pulmonary disease, unspecified: Secondary | ICD-10-CM | POA: Diagnosis not present

## 2018-06-15 DIAGNOSIS — Z79899 Other long term (current) drug therapy: Secondary | ICD-10-CM | POA: Insufficient documentation

## 2018-06-15 DIAGNOSIS — M25552 Pain in left hip: Secondary | ICD-10-CM | POA: Insufficient documentation

## 2018-06-15 DIAGNOSIS — F17210 Nicotine dependence, cigarettes, uncomplicated: Secondary | ICD-10-CM | POA: Insufficient documentation

## 2018-06-15 DIAGNOSIS — I10 Essential (primary) hypertension: Secondary | ICD-10-CM | POA: Insufficient documentation

## 2018-06-15 MED ORDER — IBUPROFEN 200 MG PO TABS
600.0000 mg | ORAL_TABLET | Freq: Once | ORAL | Status: AC
  Administered 2018-06-15: 600 mg via ORAL
  Filled 2018-06-15: qty 3

## 2018-06-15 MED ORDER — ACETAMINOPHEN 325 MG PO TABS
650.0000 mg | ORAL_TABLET | Freq: Once | ORAL | Status: AC
  Administered 2018-06-15: 650 mg via ORAL
  Filled 2018-06-15: qty 2

## 2018-06-15 NOTE — ED Provider Notes (Signed)
[facility]-EMERGENCY DEPT Provider Note   CSN: [id number] Arrival date & time: 06/15/18  1132     History   Chief Complaint Chief Complaint  Patient presents with  . Hip Pain  . Hypertension    HPI Trevor Thomas is a 52 y.o. male.  HPI  Trevor Thomas is a 52 y.o. male, with a history of anxiety, asthma, COPD, HTN, and chronic back pain, presenting to the ED with left hip pain over the past 2 weeks.  States he twisted his hip inward while walking, felt a pop, and has had pain since that time.  His pain is located in the left posterior, lateral, and anterior hip, radiates down the left leg, moderate to severe, sharp.  He has been taking ibuprofen and tramadol. He endorses some muscle spasms, but states he does not want a muscle relaxer. Patient was at his PCPs office today and was sent to the ED due to elevated blood pressure at 150/100.  Patient voices compliance with his lisinopril. Denies headache, dizziness, syncope, chest pain, shortness of breath, vision loss, numbness, weakness, fever/chills, abdominal pain, or any other complaints.     Past Medical History:  Diagnosis Date  . 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  Secondary to eye surgery  . Anxiety   . Arthritis   . Asthma   . Bronchitis    hx of  . Concussion 1983  . COPD (chronic obstructive pulmonary disease) (HCC)   . Depression   . GERD (gastroesophageal reflux disease)    takes tums/rolaids  . Head trauma   . Headache(784.0)   . Hypertension   . Umbilical hernia     Patient Active Problem List   Diagnosis Date Noted  . Bilateral leg numbness 03/14/2017  . Syncope 06/16/2016  . Chronic pain syndrome   . Pain of left hand   . Left hand weakness 05/22/2015  . Swelling of left hand 05/22/2015  . Uncomplicated opioid dependence (HCC)   . Narcotic dependence (HCC) 01/30/2015  . Acute narcotic withdrawal (HCC) 01/30/2015  . Sebaceous cyst 01/23/2015  . Encounter for  chronic pain management 10/28/2014  . Constipation 10/28/2014  . Anemia, iron deficiency 09/02/2014  . Numbness and tingling in left arm 09/02/2014  . Gastric ulcer with hemorrhage 08/25/2014  . Bleeding gastrointestinal   . Internal nasal lesion 06/04/2014  . Knee pain 05/19/2014  . Allergy to bee sting 04/08/2014  . Anxiety associated with depression 04/08/2014  . Restless leg syndrome 10/22/2013  . Chronic back pain 05/01/2013  . Tobacco abuse 09/11/2012  . COPD (chronic obstructive pulmonary disease) (HCC) 09/11/2012  . Low back pain radiating to left leg 08/10/2012  . Brachial neuritis or radiculitis 12/07/2010  . Essential hypertension 10/26/2010  . ALLERGIC RHINITIS 10/26/2010    Past Surgical History:  Procedure Laterality Date  . ESOPHAGOGASTRODUODENOSCOPY N/A 08/25/2014   Procedure: ESOPHAGOGASTRODUODENOSCOPY (EGD);  Surgeon: Louis Meckel, MD;  Location: [facility] ENDOSCOPY;  Service: Endoscopy;  Laterality: N/A;  . EYE SURGERY  20 years ago   Metal foreign body removed from Right eye  . HERNIA REPAIR  05/2012   umbilical hernia  . KNEE SURGERY    . thumb surgery (left)    . UMBILICAL HERNIA REPAIR  05/15/2012   Procedure: HERNIA REPAIR UMBILICAL ADULT;  Surgeon: Clovis Pu. Cornett, MD;  Location: [facility] OR;  Service: General;  Laterality: N/A;        Home Medications    Prior to Admission  medications   Medication Sig Start Date End Date Taking? Authorizing Provider  albuterol (PROVENTIL HFA;VENTOLIN HFA) 108 (90 Base) MCG/ACT inhaler Inhale 2 puffs into the lungs every 6 (six) hours as needed for wheezing. 01/30/17   Renne Musca, MD  citalopram (CELEXA) 40 MG tablet Take 1 tablet (40 mg total) by mouth daily. 12/22/16   Rumley, Lora Havens, DO  gabapentin (NEURONTIN) 300 MG capsule Take 1 pill three times daily (900mg  total) for the next week and increase by 300mg  weekly until you reach 2400mg  total. 06/30/17   Renne Musca, MD  lisinopril-hydrochlorothiazide  (PRINZIDE,ZESTORETIC) 20-25 MG tablet Take 1 tablet by mouth every morning. 05/11/18   Oralia Manis, DO  methylPREDNISolone (MEDROL DOSEPAK) 4 MG TBPK tablet Take as directed on packaging 01/24/18   Shaune Pollack, MD  traMADol (ULTRAM) 50 MG tablet Take 1 tablet (50 mg total) by mouth every 6 (six) hours as needed for severe pain. 01/24/18   Shaune Pollack, MD  vitamin B-12 (CYANOCOBALAMIN) 1000 MCG tablet Take 1 tablet (1,000 mcg total) by mouth daily. 03/15/17   Ardith Dark, MD    Family History Family History  Problem Relation Age of Onset  . Diabetes Mother   . Diabetes Father   . Heart failure Brother     Social History Social History   Tobacco Use  . Smoking status: Current Every Day Smoker    Packs/day: 1.00    Years: 20.00    Pack years: 20.00    Types: Cigarettes  . Smokeless tobacco: Never Used  Substance Use Topics  . Alcohol use: No    Alcohol/week: 0.0 standard drinks    Comment: rarely  - quit approx. June 2013  . Drug use: Yes    Types: Marijuana    Comment: occasionally - once every couple of months.     Allergies   Bee venom and Clonidine derivatives   Review of Systems Review of Systems  Constitutional: Negative for chills, diaphoresis and fever.  Eyes: Negative for visual disturbance.  Respiratory: Negative for shortness of breath.   Cardiovascular: Negative for chest pain.  Gastrointestinal: Negative for abdominal pain, nausea and vomiting.  Musculoskeletal: Positive for arthralgias. Negative for back pain and neck pain.  Neurological: Negative for dizziness, syncope, weakness, light-headedness, numbness and headaches.  All other systems reviewed and are negative.    Physical Exam Updated Vital Signs BP (!) 150/97 (BP Location: Left Arm)   Pulse (!) 101   Temp 98.7 F (37.1 C) (Oral)   Resp 16   Ht 5\' 11"  (1.803 m)   Wt 115.2 kg   SpO2 97%   BMI 35.43 kg/m   Physical Exam  Constitutional: He appears well-developed and  well-nourished. No distress.  HENT:  Head: Normocephalic and atraumatic.  Eyes: Pupils are equal, round, and reactive to light. Conjunctivae and EOM are normal.  Neck: Neck supple.  Cardiovascular: Normal rate, regular rhythm, normal heart sounds and intact distal pulses.  Pulmonary/Chest: Effort normal and breath sounds normal. No respiratory distress.  Abdominal: Soft. There is no tenderness. There is no guarding.  Musculoskeletal: He exhibits tenderness. He exhibits no edema.       Legs: Pain with range of motion in the left hip.  Neurological: He is alert.  Sensation grossly intact to light touch in the extremities. Strength 5/5 in all extremities.  Antalgic gait, but ambulatory without assistance.  Coordination intact. Cranial nerves III-XII grossly intact. No facial droop.   Skin: Skin is warm and  dry. He is not diaphoretic.  Psychiatric: He has a normal mood and affect. His behavior is normal.  Nursing note and vitals reviewed.    ED Treatments / Results  Labs (all labs ordered are listed, but only abnormal results are displayed) Labs Reviewed - No data to display  EKG None  Radiology Dg Lumbar Spine Complete  Result Date: 06/15/2018 CLINICAL DATA:  Pain for several days EXAM: LUMBAR SPINE - COMPLETE 4+ VIEW COMPARISON:  January 24, 2018 FINDINGS: Frontal, lateral, spot lumbosacral lateral, and bilateral oblique views were obtained. There are 5 non-rib-bearing lumbar type vertebral bodies. There is no acute fracture or spondylolisthesis. There is stable slight anterior wedging of the L1 vertebral body. Disc spaces appear unremarkable. There are pars defects at L5 bilaterally without spondylolisthesis. There is facet osteoarthritic change at L5-S1 bilaterally. There is aortic atherosclerosis. IMPRESSION: Slight anterior wedging of the L1 vertebral body, stable. No acute fracture or spondylolisthesis. Mild facet osteoarthritic change at L5-S1 bilaterally. No appreciable disc space  narrowing. Pars defects at L5 bilaterally without appreciable spondylolisthesis. There is aortic atherosclerosis. Aortic Atherosclerosis (ICD10-I70.0). Electronically Signed   By: Bretta Bang III M.D.   On: 06/15/2018 13:27   Dg Hip Unilat W Or Wo Pelvis 2-3 Views Left  Result Date: 06/15/2018 CLINICAL DATA:  Twisting injury EXAM: DG HIP (WITH OR WITHOUT PELVIS) 2-3V LEFT COMPARISON:  None. FINDINGS: Frontal pelvis as well as frontal and lateral left hip images obtained. There is moderate osteoarthritic change in both hip joints, slightly more severe on the left than on the right. No erosive change. No fracture or dislocation. IMPRESSION: Osteoarthritic change in the hip joints, slightly more on the left than on the right. No fracture or dislocation. Electronically Signed   By: Bretta Bang III M.D.   On: 06/15/2018 13:24    Procedures Procedures (including critical care time)  Medications Ordered in ED Medications  ibuprofen (ADVIL,MOTRIN) tablet 600 mg (600 mg Oral Given 06/15/18 1241)  acetaminophen (TYLENOL) tablet 650 mg (650 mg Oral Given 06/15/18 1241)     Initial Impression / Assessment and Plan / ED Course  I have reviewed the triage vital signs and the nursing notes.  Pertinent labs & imaging results that were available during my care of the patient were reviewed by me and considered in my medical decision making (see chart for details).  Clinical Course as of Jun 16 1519  Fri Jun 15, 2018  1404 Patient states his pain has greatly improved. He states he needs to go so he can meet a family member.    [SJ]  1418 Patient did not want to address this value before leaving.  He appears to be symptomatic to this and states he will follow-up with his PCP on this matter.  BP(!): 163/110 [SJ]    Clinical Course User Index [SJ] Mykale Gandolfo C, PA-C    Patient presents with acute on chronic left hip pain.  Neurovascularly intact.  Quite a few chronic abnormalities noted on  x-ray, but nothing that appears to be acute.  Regardless, patient will need to follow-up with orthopedic specialist for any further management of this issue. The patient was given instructions for home care as well as return precautions. Patient voices understanding of these instructions, accepts the plan, and is comfortable with discharge.  As for the patient's hypertension, he does not appear to be symptomatic to it at this time.  Doubt hypertensive emergency.  This issue should be managed by his PCP.  Final  Clinical Impressions(s) / ED Diagnoses   Final diagnoses:  Left hip pain  Hypertension, unspecified type    ED Discharge Orders    None       Concepcion Living 06/15/18 1524    Jacalyn Lefevre, MD 06/19/18 1740

## 2018-06-15 NOTE — ED Triage Notes (Signed)
Pt stated that EMS was called to transport patient due to elevated BP. Pt unable to put pressure on l/leg due to pain.

## 2018-06-15 NOTE — ED Triage Notes (Signed)
Per EMS- Patient c/o right hip "nerve pain" x 2 weeks. Patient states he was prescribed Tramadol and Tramadol did not help the pain. Patient was at his PCP office today and had a BP-150/100. PCP called EMS for hypertension.

## 2018-06-15 NOTE — Discharge Instructions (Signed)
You have been seen today for a hip injury. There were no acute abnormalities on the x-rays, including no sign of fracture or dislocation, however, there could be injuries to the soft tissues, such as the ligaments or tendons that are not seen on xrays. There could also be what are called occult fractures that are small fractures not seen on xray. Antiinflammatory medications: Take 600 mg of ibuprofen every 6 hours or 440 mg (over the counter dose) to 500 mg (prescription dose) of naproxen every 12 hours for the next 3 days. After this time, these medications may be used as needed for pain. Take these medications with food to avoid upset stomach. Choose only one of these medications, do not take them together. Acetaminophen (generic for Tylenol): Should you continue to have additional pain while taking the ibuprofen or naproxen, you may add in acetaminophen as needed. Your daily total maximum amount of acetaminophen from all sources should be limited to 4000mg /day for persons without liver problems, or 2000mg /day for those with liver problems. Ice: May apply ice to the area over the next 24 hours for 15 minutes at a time to reduce swelling. Elevation: Keep the extremity elevated as often as possible to reduce pain and inflammation. Follow up: Follow-up with orthopedic specialist on this matter, ideally within 2 weeks. Return: Return to the ED for numbness, weakness, increasing pain, overall worsening symptoms, loss of function, or if symptoms are not improving, you have tried to follow up with the orthopedic specialist, and have been unable to do so.

## 2018-06-15 NOTE — ED Notes (Signed)
Bed: WTR6 Expected date:  Expected time:  Means of arrival:  Comments: 

## 2018-06-27 ENCOUNTER — Ambulatory Visit (INDEPENDENT_AMBULATORY_CARE_PROVIDER_SITE_OTHER): Payer: Self-pay | Admitting: Orthopaedic Surgery

## 2018-07-04 ENCOUNTER — Other Ambulatory Visit: Payer: Self-pay | Admitting: Orthopedic Surgery

## 2018-07-09 NOTE — Patient Instructions (Signed)
Trevor Thomas  07/09/2018   Your procedure is scheduled on: 07-17-18   Report to [facility] Main  Entrance    Report to admitting at 7:30AM    Call this number if you have problems the morning of surgery [phone]     Remember: Do not eat food or drink liquids :After Midnight. BRUSH YOUR TEETH MORNING OF SURGERY AND RINSE YOUR MOUTH OUT, NO CHEWING GUM CANDY OR MINTS.     Take these medicines the morning of surgery with A SIP OF WATER: amlodipine                                You may not have any metal on your body including hair pins and              piercings  Do not wear jewelry, make-up, lotions, powders or perfumes, deodorant              Men may shave face and neck.   Do not bring valuables to the hospital. [facility] IS NOT             RESPONSIBLE   FOR VALUABLES.  Contacts, dentures or bridgework may not be worn into surgery.  Leave suitcase in the car. After surgery it may be brought to your room.     r:               Please read over the following fact sheets you were given: _____________________________________________________________________             [facility] - Preparing for Surgery Before surgery, you can play an important role.  Because skin is not sterile, your skin needs to be as free of germs as possible.  You can reduce the number of germs on your skin by washing with CHG (chlorahexidine gluconate) soap before surgery.  CHG is an antiseptic cleaner which kills germs and bonds with the skin to continue killing germs even after washing. Please DO NOT use if you have an allergy to CHG or antibacterial soaps.  If your skin becomes reddened/irritated stop using the CHG and inform your nurse when you arrive at Short Stay. Do not shave (including legs and underarms) for at least 48 hours prior to the first CHG shower.  You may shave your face/neck. Please follow these instructions carefully:  1.  Shower with CHG Soap the night  before surgery and the  morning of Surgery.  2.  If you choose to wash your hair, wash your hair first as usual with your  normal  shampoo.  3.  After you shampoo, rinse your hair and body thoroughly to remove the  shampoo.                           4.  Use CHG as you would any other liquid soap.  You can apply chg directly  to the skin and wash                       Gently with a scrungie or clean washcloth.  5.  Apply the CHG Soap to your body ONLY FROM THE NECK DOWN.   Do not use on face/ open  Wound or open sores. Avoid contact with eyes, ears mouth and genitals (private parts).                       Wash face,  Genitals (private parts) with your normal soap.             6.  Wash thoroughly, paying special attention to the area where your surgery  will be performed.  7.  Thoroughly rinse your body with warm water from the neck down.  8.  DO NOT shower/wash with your normal soap after using and rinsing off  the CHG Soap.                9.  Pat yourself dry with a clean towel.            10.  Wear clean pajamas.            11.  Place clean sheets on your bed the night of your first shower and do not  sleep with pets. Day of Surgery : Do not apply any lotions/deodorants the morning of surgery.  Please wear clean clothes to the hospital/surgery center.  FAILURE TO FOLLOW THESE INSTRUCTIONS MAY RESULT IN THE CANCELLATION OF YOUR SURGERY PATIENT SIGNATURE_________________________________  NURSE SIGNATURE__________________________________  ________________________________________________________________________   [name]  An incentive spirometer is a tool that can help keep your lungs clear and active. This tool measures how well you are filling your lungs with each breath. Taking long deep breaths may help reverse or decrease the chance of developing breathing (pulmonary) problems (especially infection) following:  A long period of time when you are  unable to move or be active. BEFORE THE PROCEDURE   If the spirometer includes an indicator to show your best effort, your nurse or respiratory therapist will set it to a desired goal.  If possible, sit up straight or lean slightly forward. Try not to slouch.  Hold the incentive spirometer in an upright position. INSTRUCTIONS FOR USE  1. Sit on the edge of your bed if possible, or sit up as far as you can in bed or on a chair. 2. Hold the incentive spirometer in an upright position. 3. Breathe out normally. 4. Place the mouthpiece in your mouth and seal your lips tightly around it. 5. Breathe in slowly and as deeply as possible, raising the piston or the ball toward the top of the column. 6. Hold your breath for 3-5 seconds or for as long as possible. Allow the piston or ball to fall to the bottom of the column. 7. Remove the mouthpiece from your mouth and breathe out normally. 8. Rest for a few seconds and repeat Steps 1 through 7 at least 10 times every 1-2 hours when you are awake. Take your time and take a few normal breaths between deep breaths. 9. The spirometer may include an indicator to show your best effort. Use the indicator as a goal to work toward during each repetition. 10. After each set of 10 deep breaths, practice coughing to be sure your lungs are clear. If you have an incision (the cut made at the time of surgery), support your incision when coughing by placing a pillow or rolled up towels firmly against it. Once you are able to get out of bed, walk around indoors and cough well. You may stop using the incentive spirometer when instructed by your caregiver.  RISKS AND COMPLICATIONS  Take your time so you do not get  dizzy or light-headed.  If you are in pain, you may need to take or ask for pain medication before doing incentive spirometry. It is harder to take a deep breath if you are having pain. AFTER USE  Rest and breathe slowly and easily.  It can be helpful to  keep track of a log of your progress. Your caregiver can provide you with a simple table to help with this. If you are using the spirometer at home, follow these instructions: [place] IF:   You are having difficultly using the spirometer.  You have trouble using the spirometer as often as instructed.  Your pain medication is not giving enough relief while using the spirometer.  You develop fever of 100.5 F (38.1 C) or higher. SEEK IMMEDIATE MEDICAL CARE IF:   You cough up bloody sputum that had not been present before.  You develop fever of 102 F (38.9 C) or greater.  You develop worsening pain at or near the incision site. MAKE SURE YOU:   Understand these instructions.  Will watch your condition.  Will get help right away if you are not doing well or get worse. Document Released: 01/30/2007 Document Revised: 12/12/2011 Document Reviewed: 04/02/2007 [facility] Patient Information 2014 [place], [place].   ________________________________________________________________________

## 2018-07-12 ENCOUNTER — Inpatient Hospital Stay (HOSPITAL_COMMUNITY)
Admission: RE | Admit: 2018-07-12 | Discharge: 2018-07-12 | Disposition: A | Discharge status: Home / Self Care | Payer: Medicaid Other | Source: Ambulatory Visit

## 2018-07-12 NOTE — Progress Notes (Signed)
Patient not present at time of pre-op appt. RN called patient to determine whereabouts. Patient reports that he has been working with his NP to get his BP under control and that NP is not wanting him to proceed with surgery until his BP is under control. RN advised patient to contact his surgeons office to make aware.   RN then called and spoke with surgery scheduler Alfonso Ramus to relay info received from patient. Per Cordelia Pen , their office is aware that patient has been having BP issues but has not been cleared for surgery thus they will now have to cancel his surgery until they receive clearance. Lynnell Grain for call.

## 2018-07-17 ENCOUNTER — Inpatient Hospital Stay: Admit: 2018-07-17 | Payer: Medicaid Other | Admitting: Orthopedic Surgery

## 2018-07-17 SURGERY — ARTHROPLASTY, HIP, TOTAL,POSTERIOR APPROACH
Anesthesia: Choice | Site: Hip | Laterality: Left

## 2018-10-22 NOTE — Patient Instructions (Addendum)
Trevor Thomas  30-Sep-1966    Your procedure is scheduled on:  10-30-2018   Report to [facility] Main  Entrance, Report to admitting at  10:30 AM    Call this number if you have problems the morning of surgery [phone]                Remember: Do not eat food  :After Midnight.  May have clear liquid diet from midnight until 7:00 AM morning of surgery.  After 7:00 AM nothing by mouth including candy, gum, mints.  BRUSH YOUR TEETH MORNING OF SURGERY AND RINSE YOUR MOUTH.        Take these medicines the morning of surgery with A SIP OF WATER:  Amlodipine (norvasc),  Tylenol if needed,  Flexeril if needed, Bring rescue with you day of surgery  SPECIAL INSTRUCTIONS: STOP SUBOXONE 3 DAYS PRIOR TO SURGERY                                  You may not have any metal on your body including hair pins and               piercings  Do not wear jewelry, make-up, lotions, powders or perfumes, deodorant                        Men may shave face and neck.      Do not bring valuables to the hospital. [facility] IS NOT             RESPONSIBLE   FOR VALUABLES.  Contacts, dentures or bridgework may not be worn into surgery.  Leave suitcase in the car. After surgery it may be brought to your room.     _____________________________________________________________________     CLEAR LIQUID DIET   Foods Allowed                                                                     Foods Excluded  Coffee and tea, regular and decaf                             liquids that you cannot  Plain Jell-O in any flavor                                             see through such as: Fruit ices (not with fruit pulp)                                     milk, soups, orange juice  Iced Popsicles                                    All solid food Carbonated beverages, regular and diet  Cranberry, grape and apple juices Sports drinks like  Gatorade Lightly seasoned clear broth or consume(fat free) Sugar, honey syrup  Sample Menu Breakfast                                Lunch                                     Supper Cranberry juice                    Beef broth                            Chicken broth Jell-O                                     Grape juice                           Apple juice Coffee or tea                        Jell-O                                      Popsicle                                                Coffee or tea                        Coffee or tea  _____________________________________________________________________             [facility] - Preparing for Surgery Before surgery, you can play an important role.  Because skin is not sterile, your skin needs to be as free of germs as possible.  You can reduce the number of germs on your skin by washing with CHG (chlorahexidine gluconate) soap before surgery.  CHG is an antiseptic cleaner which kills germs and bonds with the skin to continue killing germs even after washing. Please DO NOT use if you have an allergy to CHG or antibacterial soaps.  If your skin becomes reddened/irritated stop using the CHG and inform your nurse when you arrive at Short Stay. Do not shave (including legs and underarms) for at least 48 hours prior to the first CHG shower.  You may shave your face/neck. Please follow these instructions carefully:  1.  Shower with CHG Soap the night before surgery and the  morning of Surgery.  2.  If you choose to wash your hair, wash your hair first as usual with your  normal  shampoo.  3.  After you shampoo, rinse your hair and body thoroughly to remove the  shampoo.                            4.  Use CHG as you would any other liquid soap.  You can apply  chg directly  to the skin and wash                       Gently with a scrungie or clean washcloth.   5.  Apply the CHG Soap to your body ONLY FROM THE NECK DOWN.   Do not use on  face/ open                           Wound or open sores. Avoid contact with eyes, ears mouth and genitals (private parts).                       Wash face,  Genitals (private parts) with your normal soap.             6.  Wash thoroughly, paying special attention to the area where your surgery  will be performed.  7.  Thoroughly rinse your body with warm water from the neck down.  8.  DO NOT shower/wash with your normal soap after using and rinsing off  the CHG Soap.             9.  Pat yourself dry with a clean towel.            10.  Wear clean pajamas.            11.  Place clean sheets on your bed the night of your first shower and do not  sleep with pets. Day of Surgery : Do not apply any lotions/deodorants the morning of surgery.  Please wear clean clothes to the hospital/surgery center.  FAILURE TO FOLLOW THESE INSTRUCTIONS MAY RESULT IN THE CANCELLATION OF YOUR SURGERY PATIENT SIGNATURE_________________________________  NURSE SIGNATURE__________________________________  ________________________________________________________________________

## 2018-10-23 ENCOUNTER — Encounter (HOSPITAL_COMMUNITY)
Admission: RE | Admit: 2018-10-23 | Discharge: 2018-10-23 | Disposition: A | Discharge status: Home / Self Care | Payer: Medicaid Other | Source: Ambulatory Visit | Attending: Orthopedic Surgery | Admitting: Orthopedic Surgery

## 2018-10-23 ENCOUNTER — Encounter (HOSPITAL_COMMUNITY): Payer: Self-pay

## 2018-10-23 ENCOUNTER — Other Ambulatory Visit: Payer: Self-pay

## 2018-10-23 DIAGNOSIS — Z01812 Encounter for preprocedural laboratory examination: Secondary | ICD-10-CM | POA: Insufficient documentation

## 2018-10-23 HISTORY — DX: Cannabis use, unspecified, uncomplicated: F12.90

## 2018-10-23 HISTORY — DX: Other specified postprocedural states: R11.2

## 2018-10-23 HISTORY — DX: Other chronic pain: G89.29

## 2018-10-23 HISTORY — DX: Opioid dependence, uncomplicated: F11.20

## 2018-10-23 HISTORY — DX: Other symptoms and signs involving the musculoskeletal system: R29.898

## 2018-10-23 HISTORY — DX: Personal history of other mental and behavioral disorders: Z86.59

## 2018-10-23 HISTORY — DX: Unspecified osteoarthritis, unspecified site: M19.90

## 2018-10-23 HISTORY — DX: Other cervical disc degeneration, unspecified cervical region: M50.30

## 2018-10-23 HISTORY — DX: Personal history of other specified conditions: Z87.898

## 2018-10-23 HISTORY — DX: Personal history of other diseases of the digestive system: Z87.19

## 2018-10-23 HISTORY — DX: Pain in left hip: M25.552

## 2018-10-23 HISTORY — DX: Personal history of other (healed) physical injury and trauma: Z87.828

## 2018-10-23 HISTORY — DX: Other specified postprocedural states: Z98.890

## 2018-10-23 HISTORY — DX: Presence of dental prosthetic device (complete) (partial): Z97.2

## 2018-10-23 LAB — BASIC METABOLIC PANEL
ANION GAP: 8 (ref 5–15)
BUN: 11 mg/dL (ref 6–20)
CO2: 26 mmol/L (ref 22–32)
Calcium: 8.9 mg/dL (ref 8.9–10.3)
Chloride: 104 mmol/L (ref 98–111)
Creatinine, Ser: 0.61 mg/dL (ref 0.61–1.24)
GFR calc Af Amer: >60 mL/min (ref >60)
GLUCOSE: 117 mg/dL — AB (ref 70–99)
POTASSIUM: 4 mmol/L (ref 3.5–5.1)
Sodium: 138 mmol/L (ref 135–145)

## 2018-10-23 LAB — SURGICAL PCR SCREEN
MRSA, PCR: NEGATIVE
STAPHYLOCOCCUS AUREUS: POSITIVE — AB

## 2018-10-23 LAB — CBC
HEMATOCRIT: 45.9 % (ref 39.0–52.0)
HEMOGLOBIN: 14.7 g/dL (ref 13.0–17.0)
MCH: 29.8 pg (ref 26.0–34.0)
MCHC: 32 g/dL (ref 30.0–36.0)
MCV: 93.1 fL (ref 80.0–100.0)
Platelets: 247 10*3/uL (ref 150–400)
RBC: 4.93 MIL/uL (ref 4.22–5.81)
RDW: 12.8 % (ref 11.5–15.5)
WBC: 7.3 10*3/uL (ref 4.0–10.5)
nRBC: 0 % (ref 0.0–0.2)

## 2018-10-23 NOTE — Progress Notes (Addendum)
EKG dated 03-03-2018 in epic.  CXR dated 03-03-2018 in epic.   ADDENDUM:  postivie PCR screen result dated 10-23-2018 routed to dr Dion Saucier in epic. Spoke w/ pt via phone , informed of positive test and to pick up mupirocin at his pharmacy [facility].  Called and lvm for pharmacist at [facility] for mupirocin prescription.

## 2018-10-26 NOTE — Anesthesia Preprocedure Evaluation (Addendum)
Anesthesia Evaluation  Patient identified by MRN, date of birth, ID band Patient awake    Reviewed: Allergy & Precautions, NPO status , Patient's Chart, lab work & pertinent test results  History of Anesthesia Complications (+) PONV  Airway Mallampati: II  TM Distance: >3 FB Neck ROM: Full    Dental no notable dental hx.    Pulmonary COPD, Current Smoker,    Pulmonary exam normal breath sounds clear to auscultation       Cardiovascular hypertension, Normal cardiovascular exam Rhythm:Regular Rate:Normal     Neuro/Psych Anxiety Narcotic dependence on suboxone    GI/Hepatic Neg liver ROS, PUD,   Endo/Other  obesity  Renal/GU negative Renal ROS  negative genitourinary   Musculoskeletal negative musculoskeletal ROS (+)   Abdominal   Peds negative pediatric ROS (+)  Hematology negative hematology ROS (+)   Anesthesia Other Findings   Reproductive/Obstetrics negative OB ROS                          Anesthesia Physical Anesthesia Plan  ASA: III  Anesthesia Plan: Spinal   Post-op Pain Management:    Induction: Intravenous  PONV Risk Score and Plan: 2 and Ondansetron and Dexamethasone  Airway Management Planned: Simple Face Mask  Additional Equipment:   Intra-op Plan:   Post-operative Plan:   Informed Consent: I have reviewed the patients History and Physical, chart, labs and discussed the procedure including the risks, benefits and alternatives for the proposed anesthesia with the patient or authorized representative who has indicated his/her understanding and acceptance.     Dental advisory given  Plan Discussed with: CRNA and Surgeon  Anesthesia Plan Comments: (See PST note 10/23/18, Jodell Cipro, PA-C)       Anesthesia Quick Evaluation
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
## 2018-10-30 ENCOUNTER — Encounter (HOSPITAL_COMMUNITY): Payer: Self-pay | Admitting: Emergency Medicine

## 2018-10-30 ENCOUNTER — Inpatient Hospital Stay (HOSPITAL_COMMUNITY): Payer: Medicaid Other | Admitting: Physician Assistant

## 2018-10-30 ENCOUNTER — Inpatient Hospital Stay (HOSPITAL_COMMUNITY): Payer: Medicaid Other

## 2018-10-30 ENCOUNTER — Other Ambulatory Visit: Payer: Self-pay | Admitting: Orthopedic Surgery

## 2018-10-30 ENCOUNTER — Inpatient Hospital Stay (HOSPITAL_COMMUNITY): Payer: Medicaid Other | Admitting: Anesthesiology

## 2018-10-30 ENCOUNTER — Observation Stay (HOSPITAL_COMMUNITY)
Admission: RE | Admit: 2018-10-30 | Discharge: 2018-10-31 | Disposition: A | Discharge status: Home / Self Care | Payer: Medicaid Other | Attending: Orthopedic Surgery | Admitting: Orthopedic Surgery

## 2018-10-30 ENCOUNTER — Other Ambulatory Visit: Payer: Self-pay

## 2018-10-30 ENCOUNTER — Encounter (HOSPITAL_COMMUNITY)
Admission: RE | Disposition: A | Discharge status: Home / Self Care | Payer: Self-pay | Source: Home / Self Care | Attending: Orthopedic Surgery

## 2018-10-30 DIAGNOSIS — E669 Obesity, unspecified: Secondary | ICD-10-CM | POA: Insufficient documentation

## 2018-10-30 DIAGNOSIS — Z6836 Body mass index (BMI) 36.0-36.9, adult: Secondary | ICD-10-CM | POA: Diagnosis not present

## 2018-10-30 DIAGNOSIS — Z87892 Personal history of anaphylaxis: Secondary | ICD-10-CM | POA: Insufficient documentation

## 2018-10-30 DIAGNOSIS — Z79891 Long term (current) use of opiate analgesic: Secondary | ICD-10-CM | POA: Diagnosis not present

## 2018-10-30 DIAGNOSIS — M1612 Unilateral primary osteoarthritis, left hip: Secondary | ICD-10-CM | POA: Diagnosis not present

## 2018-10-30 DIAGNOSIS — J449 Chronic obstructive pulmonary disease, unspecified: Secondary | ICD-10-CM | POA: Insufficient documentation

## 2018-10-30 DIAGNOSIS — F17210 Nicotine dependence, cigarettes, uncomplicated: Secondary | ICD-10-CM | POA: Insufficient documentation

## 2018-10-30 DIAGNOSIS — Z96649 Presence of unspecified artificial hip joint: Secondary | ICD-10-CM

## 2018-10-30 DIAGNOSIS — Z91030 Bee allergy status: Secondary | ICD-10-CM | POA: Diagnosis not present

## 2018-10-30 DIAGNOSIS — I10 Essential (primary) hypertension: Secondary | ICD-10-CM | POA: Insufficient documentation

## 2018-10-30 DIAGNOSIS — Z888 Allergy status to other drugs, medicaments and biological substances status: Secondary | ICD-10-CM | POA: Diagnosis not present

## 2018-10-30 DIAGNOSIS — Z79899 Other long term (current) drug therapy: Secondary | ICD-10-CM | POA: Insufficient documentation

## 2018-10-30 HISTORY — DX: Unilateral primary osteoarthritis, left hip: M16.12

## 2018-10-30 HISTORY — PX: TOTAL HIP ARTHROPLASTY: SHX124

## 2018-10-30 SURGERY — ARTHROPLASTY, HIP, TOTAL,POSTERIOR APPROACH
Anesthesia: Spinal | Site: Hip | Laterality: Left

## 2018-10-30 MED ORDER — BUPIVACAINE HCL (PF) 0.25 % IJ SOLN
INTRAMUSCULAR | Status: DC | PRN
  Administered 2018-10-30: 30 mL

## 2018-10-30 MED ORDER — PROPOFOL 10 MG/ML IV BOLUS
INTRAVENOUS | Status: AC
  Filled 2018-10-30: qty 60

## 2018-10-30 MED ORDER — PROPOFOL 10 MG/ML IV BOLUS
INTRAVENOUS | Status: DC | PRN
  Administered 2018-10-30: 20 mg via INTRAVENOUS
  Administered 2018-10-30: 30 mg via INTRAVENOUS
  Administered 2018-10-30: 20 mg via INTRAVENOUS

## 2018-10-30 MED ORDER — OXYCODONE HCL 5 MG PO TABS
10.0000 mg | ORAL_TABLET | ORAL | Status: DC | PRN
  Administered 2018-10-30: 5 mg via ORAL
  Administered 2018-10-30: 15 mg via ORAL
  Administered 2018-10-30: 10 mg via ORAL
  Administered 2018-10-31 (×2): 15 mg via ORAL
  Filled 2018-10-30: qty 2
  Filled 2018-10-30 (×3): qty 3

## 2018-10-30 MED ORDER — METOCLOPRAMIDE HCL 5 MG PO TABS: 5.0000 mg | ORAL_TABLET | Freq: Three times a day (TID) | ORAL | Status: DC | PRN

## 2018-10-30 MED ORDER — ONDANSETRON HCL 4 MG/2ML IJ SOLN: 4.0000 mg | Freq: Four times a day (QID) | INTRAMUSCULAR | Status: DC | PRN

## 2018-10-30 MED ORDER — PHENYLEPHRINE HCL 10 MG/ML IJ SOLN
INTRAMUSCULAR | Status: AC
  Filled 2018-10-30: qty 1

## 2018-10-30 MED ORDER — HYDROMORPHONE HCL 1 MG/ML IJ SOLN
0.5000 mg | INTRAMUSCULAR | Status: DC | PRN
  Administered 2018-10-30: 1 mg via INTRAVENOUS
  Filled 2018-10-30: qty 1

## 2018-10-30 MED ORDER — PROPOFOL 10 MG/ML IV BOLUS
INTRAVENOUS | Status: AC
  Filled 2018-10-30: qty 40

## 2018-10-30 MED ORDER — BUPIVACAINE HCL (PF) 0.25 % IJ SOLN
INTRAMUSCULAR | Status: AC
  Filled 2018-10-30: qty 30

## 2018-10-30 MED ORDER — PROPOFOL 500 MG/50ML IV EMUL
INTRAVENOUS | Status: DC | PRN
  Administered 2018-10-30: 75 ug/kg/min via INTRAVENOUS

## 2018-10-30 MED ORDER — POLYETHYLENE GLYCOL 3350 17 G PO PACK: 17.0000 g | PACK | Freq: Every day | ORAL | Status: DC | PRN

## 2018-10-30 MED ORDER — SODIUM BICARBONATE 8.4 % IV SOLN
INTRAVENOUS | Status: AC
  Filled 2018-10-30: qty 50

## 2018-10-30 MED ORDER — LISINOPRIL 20 MG PO TABS
40.0000 mg | ORAL_TABLET | Freq: Every day | ORAL | Status: DC
  Administered 2018-10-31: 40 mg via ORAL
  Filled 2018-10-30 (×2): qty 2

## 2018-10-30 MED ORDER — FENTANYL CITRATE (PF) 100 MCG/2ML IJ SOLN
INTRAMUSCULAR | Status: DC | PRN
  Administered 2018-10-30: 100 ug via INTRAVENOUS

## 2018-10-30 MED ORDER — HYDROMORPHONE HCL 1 MG/ML IJ SOLN
0.2500 mg | INTRAMUSCULAR | Status: DC | PRN
  Administered 2018-10-30: 0.5 mg via INTRAVENOUS
  Administered 2018-10-30 (×2): 0.25 mg via INTRAVENOUS
  Administered 2018-10-30: 0.5 mg via INTRAVENOUS

## 2018-10-30 MED ORDER — DOCUSATE SODIUM 100 MG PO CAPS
100.0000 mg | ORAL_CAPSULE | Freq: Two times a day (BID) | ORAL | Status: DC
  Administered 2018-10-31: 100 mg via ORAL
  Filled 2018-10-30: qty 1

## 2018-10-30 MED ORDER — DEXAMETHASONE SODIUM PHOSPHATE 10 MG/ML IJ SOLN
INTRAMUSCULAR | Status: AC
  Filled 2018-10-30: qty 1

## 2018-10-30 MED ORDER — DEXAMETHASONE SODIUM PHOSPHATE 10 MG/ML IJ SOLN
10.0000 mg | Freq: Once | INTRAMUSCULAR | Status: AC
  Administered 2018-10-31: 10 mg via INTRAVENOUS
  Filled 2018-10-30: qty 1

## 2018-10-30 MED ORDER — METHOCARBAMOL 500 MG PO TABS
500.0000 mg | ORAL_TABLET | Freq: Four times a day (QID) | ORAL | Status: DC | PRN
  Administered 2018-10-31: 500 mg via ORAL
  Filled 2018-10-30: qty 1

## 2018-10-30 MED ORDER — ACETAMINOPHEN 325 MG PO TABS: 325.0000 mg | ORAL_TABLET | Freq: Four times a day (QID) | ORAL | Status: DC | PRN

## 2018-10-30 MED ORDER — PROPOFOL 10 MG/ML IV BOLUS
INTRAVENOUS | Status: AC
  Filled 2018-10-30: qty 20

## 2018-10-30 MED ORDER — ALBUTEROL SULFATE (2.5 MG/3ML) 0.083% IN NEBU: 2.5000 mg | INHALATION_SOLUTION | Freq: Four times a day (QID) | RESPIRATORY_TRACT | Status: DC | PRN

## 2018-10-30 MED ORDER — LACTATED RINGERS IV SOLN
INTRAVENOUS | Status: DC
  Administered 2018-10-30 (×3): via INTRAVENOUS

## 2018-10-30 MED ORDER — DIPHENHYDRAMINE HCL 12.5 MG/5ML PO ELIX: 12.5000 mg | ORAL_SOLUTION | ORAL | Status: DC | PRN

## 2018-10-30 MED ORDER — CEFAZOLIN SODIUM-DEXTROSE 2-4 GM/100ML-% IV SOLN
2.0000 g | Freq: Four times a day (QID) | INTRAVENOUS | Status: AC
  Administered 2018-10-30 – 2018-10-31 (×2): 2 g via INTRAVENOUS
  Filled 2018-10-30 (×2): qty 100

## 2018-10-30 MED ORDER — BISACODYL 10 MG RE SUPP: 10.0000 mg | Freq: Every day | RECTAL | Status: DC | PRN

## 2018-10-30 MED ORDER — PHENYLEPHRINE 40 MCG/ML (10ML) SYRINGE FOR IV PUSH (FOR BLOOD PRESSURE SUPPORT)
PREFILLED_SYRINGE | INTRAVENOUS | Status: DC | PRN
  Administered 2018-10-30 (×2): 80 ug via INTRAVENOUS

## 2018-10-30 MED ORDER — KETOROLAC TROMETHAMINE 30 MG/ML IJ SOLN
INTRAMUSCULAR | Status: DC | PRN
  Administered 2018-10-30: 30 mg

## 2018-10-30 MED ORDER — ONDANSETRON HCL 4 MG/2ML IJ SOLN
INTRAMUSCULAR | Status: AC
  Filled 2018-10-30: qty 2

## 2018-10-30 MED ORDER — 0.9 % SODIUM CHLORIDE (POUR BTL) OPTIME
TOPICAL | Status: DC | PRN
  Administered 2018-10-30: 1000 mL

## 2018-10-30 MED ORDER — CHLORHEXIDINE GLUCONATE 4 % EX LIQD: 60.0000 mL | Freq: Once | CUTANEOUS | Status: DC

## 2018-10-30 MED ORDER — AMLODIPINE BESYLATE 10 MG PO TABS
10.0000 mg | ORAL_TABLET | Freq: Every day | ORAL | Status: DC
  Administered 2018-10-31: 10 mg via ORAL
  Filled 2018-10-30 (×2): qty 1

## 2018-10-30 MED ORDER — MAGNESIUM CITRATE PO SOLN: 1.0000 | Freq: Once | ORAL | Status: DC | PRN

## 2018-10-30 MED ORDER — CEFAZOLIN SODIUM-DEXTROSE 2-4 GM/100ML-% IV SOLN
2.0000 g | INTRAVENOUS | Status: AC
  Administered 2018-10-30: 2 g via INTRAVENOUS
  Filled 2018-10-30: qty 100

## 2018-10-30 MED ORDER — METOCLOPRAMIDE HCL 5 MG/ML IJ SOLN: 5.0000 mg | Freq: Three times a day (TID) | INTRAMUSCULAR | Status: DC | PRN

## 2018-10-30 MED ORDER — KETOROLAC TROMETHAMINE 15 MG/ML IJ SOLN
7.5000 mg | Freq: Four times a day (QID) | INTRAMUSCULAR | Status: DC
  Administered 2018-10-30 – 2018-10-31 (×3): 7.5 mg via INTRAVENOUS
  Filled 2018-10-30 (×2): qty 1

## 2018-10-30 MED ORDER — PROMETHAZINE HCL 25 MG/ML IJ SOLN: 6.2500 mg | INTRAMUSCULAR | Status: DC | PRN

## 2018-10-30 MED ORDER — ASPIRIN EC 325 MG PO TBEC
325.0000 mg | DELAYED_RELEASE_TABLET | Freq: Two times a day (BID) | ORAL | Status: DC
  Administered 2018-10-30 – 2018-10-31 (×2): 325 mg via ORAL
  Filled 2018-10-30 (×2): qty 1

## 2018-10-30 MED ORDER — DEXAMETHASONE SODIUM PHOSPHATE 10 MG/ML IJ SOLN
INTRAMUSCULAR | Status: DC | PRN
  Administered 2018-10-30: 10 mg via INTRAVENOUS

## 2018-10-30 MED ORDER — KETOROLAC TROMETHAMINE 30 MG/ML IJ SOLN
INTRAMUSCULAR | Status: AC
  Filled 2018-10-30: qty 1

## 2018-10-30 MED ORDER — FENTANYL CITRATE (PF) 100 MCG/2ML IJ SOLN
INTRAMUSCULAR | Status: AC
  Filled 2018-10-30: qty 2

## 2018-10-30 MED ORDER — METHOCARBAMOL 500 MG IVPB - SIMPLE MED
INTRAVENOUS | Status: AC
  Filled 2018-10-30: qty 50

## 2018-10-30 MED ORDER — PHENYLEPHRINE 40 MCG/ML (10ML) SYRINGE FOR IV PUSH (FOR BLOOD PRESSURE SUPPORT)
PREFILLED_SYRINGE | INTRAVENOUS | Status: AC
  Filled 2018-10-30: qty 10

## 2018-10-30 MED ORDER — ACETAMINOPHEN 500 MG PO TABS
1000.0000 mg | ORAL_TABLET | Freq: Four times a day (QID) | ORAL | Status: DC
  Administered 2018-10-30 – 2018-10-31 (×3): 1000 mg via ORAL
  Filled 2018-10-30 (×2): qty 2

## 2018-10-30 MED ORDER — ZOLPIDEM TARTRATE 5 MG PO TABS
5.0000 mg | ORAL_TABLET | Freq: Every evening | ORAL | Status: DC | PRN
  Administered 2018-10-30 (×2): 5 mg via ORAL
  Filled 2018-10-30 (×2): qty 1

## 2018-10-30 MED ORDER — OXYCODONE HCL 5 MG PO TABS
5.0000 mg | ORAL_TABLET | ORAL | Status: DC | PRN
  Filled 2018-10-30: qty 1

## 2018-10-30 MED ORDER — KETAMINE HCL 10 MG/ML IJ SOLN
INTRAMUSCULAR | Status: DC | PRN
  Administered 2018-10-30: 40 mg via INTRAVENOUS

## 2018-10-30 MED ORDER — STERILE WATER FOR IRRIGATION IR SOLN
Status: DC | PRN
  Administered 2018-10-30: 2000 mL

## 2018-10-30 MED ORDER — POTASSIUM CHLORIDE IN NACL 20-0.45 MEQ/L-% IV SOLN
INTRAVENOUS | Status: DC
  Administered 2018-10-30: 21:00:00 via INTRAVENOUS
  Filled 2018-10-30 (×2): qty 1000

## 2018-10-30 MED ORDER — ONDANSETRON HCL 4 MG PO TABS: 4.0000 mg | ORAL_TABLET | Freq: Four times a day (QID) | ORAL | Status: DC | PRN

## 2018-10-30 MED ORDER — MENTHOL 3 MG MT LOZG: 1.0000 | LOZENGE | OROMUCOSAL | Status: DC | PRN

## 2018-10-30 MED ORDER — MIDAZOLAM HCL 2 MG/2ML IJ SOLN
INTRAMUSCULAR | Status: AC
  Filled 2018-10-30: qty 2

## 2018-10-30 MED ORDER — ALUM & MAG HYDROXIDE-SIMETH 200-200-20 MG/5ML PO SUSP: 30.0000 mL | ORAL | Status: DC | PRN

## 2018-10-30 MED ORDER — TRANEXAMIC ACID-NACL 1000-0.7 MG/100ML-% IV SOLN
1000.0000 mg | Freq: Once | INTRAVENOUS | Status: AC
  Administered 2018-10-30: 1000 mg via INTRAVENOUS
  Filled 2018-10-30: qty 100

## 2018-10-30 MED ORDER — MIDAZOLAM HCL 5 MG/5ML IJ SOLN
INTRAMUSCULAR | Status: DC | PRN
  Administered 2018-10-30: 2 mg via INTRAVENOUS

## 2018-10-30 MED ORDER — LIDOCAINE 2% (20 MG/ML) 5 ML SYRINGE
INTRAMUSCULAR | Status: AC
  Filled 2018-10-30: qty 5

## 2018-10-30 MED ORDER — HYDROMORPHONE HCL 1 MG/ML IJ SOLN
INTRAMUSCULAR | Status: AC
  Administered 2018-10-30: 0.5 mg via INTRAVENOUS
  Filled 2018-10-30: qty 2

## 2018-10-30 MED ORDER — PHENOL 1.4 % MT LIQD
1.0000 | OROMUCOSAL | Status: DC | PRN
  Filled 2018-10-30: qty 177

## 2018-10-30 MED ORDER — BUPIVACAINE IN DEXTROSE 0.75-8.25 % IT SOLN
INTRATHECAL | Status: DC | PRN
  Administered 2018-10-30: 2 mL via INTRATHECAL

## 2018-10-30 MED ORDER — SODIUM CHLORIDE 0.9 % IV SOLN
INTRAVENOUS | Status: DC | PRN
  Administered 2018-10-30: 50 ug/min via INTRAVENOUS

## 2018-10-30 MED ORDER — METHOCARBAMOL 500 MG IVPB - SIMPLE MED
500.0000 mg | Freq: Four times a day (QID) | INTRAVENOUS | Status: DC | PRN
  Administered 2018-10-30: 500 mg via INTRAVENOUS
  Filled 2018-10-30: qty 50

## 2018-10-30 SURGICAL SUPPLY — 53 items
ACETAB CUP W/GRIPTION 54 (Plate) ×2 IMPLANT
ARTICULEZE HEAD (Hips) ×2 IMPLANT
BIT DRILL 2.0X128 (BIT) ×2 IMPLANT
BLADE SAW SGTL 73X25 THK (BLADE) ×2 IMPLANT
BLADE SURG SZ10 CARB STEEL (BLADE) ×4 IMPLANT
COVER SURGICAL LIGHT HANDLE (MISCELLANEOUS) ×2 IMPLANT
COVER WAND RF STERILE (DRAPES) ×2 IMPLANT
CUP ACETAB W/GRIPTION 54 (Plate) ×1 IMPLANT
DRAPE INCISE IOBAN 66X45 STRL (DRAPES) ×4 IMPLANT
DRAPE ORTHO SPLIT 77X108 STRL (DRAPES) ×2
DRAPE POUCH INSTRU U-SHP 10X18 (DRAPES) ×2 IMPLANT
DRAPE SHEET LG 3/4 BI-LAMINATE (DRAPES) ×2 IMPLANT
DRAPE SURG 17X11 SM STRL (DRAPES) ×2 IMPLANT
DRAPE SURG ORHT 6 SPLT 77X108 (DRAPES) ×2 IMPLANT
DRAPE U-SHAPE 47X51 STRL (DRAPES) ×2 IMPLANT
DRSG MEPILEX BORDER 4X8 (GAUZE/BANDAGES/DRESSINGS) ×2 IMPLANT
DURAPREP 26ML APPLICATOR (WOUND CARE) ×4 IMPLANT
ELECT BLADE TIP CTD 4 INCH (ELECTRODE) ×2 IMPLANT
ELECT REM PT RETURN 15FT ADLT (MISCELLANEOUS) ×2 IMPLANT
ELIMINATOR HOLE APEX DEPUY (Hips) ×2 IMPLANT
GLOVE BIO SURGEON STRL SZ7.5 (GLOVE) ×2 IMPLANT
GLOVE BIO SURGEON STRL SZ8 (GLOVE) ×2 IMPLANT
GLOVE BIOGEL PI IND STRL 8 (GLOVE) ×2 IMPLANT
GLOVE BIOGEL PI INDICATOR 8 (GLOVE) ×2
GOWN STRL REUS W/TWL 2XL LVL3 (GOWN DISPOSABLE) ×2 IMPLANT
GOWN STRL REUS W/TWL LRG LVL3 (GOWN DISPOSABLE) ×2 IMPLANT
HEAD ARTICULEZE (Hips) ×1 IMPLANT
HOOD PEEL AWAY FLYTE STAYCOOL (MISCELLANEOUS) ×6 IMPLANT
KIT BASIN OR (CUSTOM PROCEDURE TRAY) ×2 IMPLANT
LINER NEUTRAL 54X36MM PLUS 4 (Hips) ×2 IMPLANT
MANIFOLD NEPTUNE II (INSTRUMENTS) ×2 IMPLANT
NEEDLE HYPO 22GX1.5 SAFETY (NEEDLE) ×2 IMPLANT
NS IRRIG 1000ML POUR BTL (IV SOLUTION) ×2 IMPLANT
PACK TOTAL JOINT (CUSTOM PROCEDURE TRAY) ×2 IMPLANT
PROTECTOR NERVE ULNAR (MISCELLANEOUS) ×2 IMPLANT
RETRIEVER SUT HEWSON (MISCELLANEOUS) ×2 IMPLANT
STEM HIP DEPUY (Hips) ×2 IMPLANT
STRIP CLOSURE SKIN 1/2X4 (GAUZE/BANDAGES/DRESSINGS) ×2 IMPLANT
SUCTION FRAZIER HANDLE 12FR (TUBING) ×1
SUCTION TUBE FRAZIER 12FR DISP (TUBING) ×1 IMPLANT
SUT FIBERWIRE #2 38 T-5 BLUE (SUTURE) ×6
SUT MNCRL AB 4-0 PS2 18 (SUTURE) IMPLANT
SUT VIC AB 0 CT1 36 (SUTURE) ×2 IMPLANT
SUT VIC AB 2-0 CT1 27 (SUTURE) ×2
SUT VIC AB 2-0 CT1 TAPERPNT 27 (SUTURE) ×2 IMPLANT
SUT VIC AB 3-0 SH 8-18 (SUTURE) ×2 IMPLANT
SUTURE FIBERWR #2 38 T-5 BLUE (SUTURE) ×3 IMPLANT
SYR CONTROL 10ML LL (SYRINGE) ×2 IMPLANT
TOWEL OR 17X26 10 PK STRL BLUE (TOWEL DISPOSABLE) ×4 IMPLANT
TOWEL OR NON WOVEN STRL DISP B (DISPOSABLE) ×2 IMPLANT
TRAY FOLEY MTR SLVR 16FR STAT (SET/KITS/TRAYS/PACK) ×2 IMPLANT
WATER STERILE IRR 1000ML POUR (IV SOLUTION) ×4 IMPLANT
YANKAUER SUCT BULB TIP 10FT TU (MISCELLANEOUS) ×2 IMPLANT

## 2018-10-30 NOTE — Progress Notes (Signed)
Around 1830, pt became very anxious about having foley catheter.  He became very adamant that it be removed "now" instead of the ordered pod day 1. Pt repeatedly stood up trying to be becoming more and more agitated, stating over and over he wanted catheter out now.  Dr. Dion Saucier paged and order given to to remove foley. Pt much calmer, RN will monitor. Pt DTV at this time.
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
## 2018-10-30 NOTE — Op Note (Signed)
10/30/2018  3:32 PM  PATIENT:  Trevor Thomas   MRN: [id number]  PRE-OPERATIVE DIAGNOSIS:  Left hip primary localized osteoarthritis  POST-OPERATIVE DIAGNOSIS:  same  PROCEDURE:  Procedure(s): TOTAL HIP ARTHROPLASTY  PREOPERATIVE INDICATIONS:    Trevor Thomas is an 53 y.o. male who has a diagnosis of left hip oa and elected for surgical management after failing conservative treatment.  The risks benefits and alternatives were discussed with the patient including but not limited to the risks of nonoperative treatment, versus surgical intervention including infection, bleeding, nerve injury, periprosthetic fracture, the need for revision surgery, dislocation, leg length discrepancy, blood clots, cardiopulmonary complications, morbidity, mortality, among others, and they were willing to proceed.     OPERATIVE REPORT     SURGEON:  Marchia Bond, MD    ASSISTANT:  Joya Gaskins, OPA-C  (Present throughout the entire procedure,  necessary for completion of procedure in a timely manner, assisting with retraction, instrumentation, and closure)     ANESTHESIA:  Spinal with intraoperative injection of marcaine and toradol  ESTIMATED BLOOD LOSS: 976 ml    COMPLICATIONS:  None.     UNIQUE ASPECTS OF THE CASE:  The anterior acetabulum was somewhat shallow.  The femur seemed to need high offset to recreate soft tissue tension.    COMPONENTS:  [employer] fit femur size 5 high offset with a 36 mm +5 metallic head ball and a Gription Acetabular shell size 54, with an apex hole eliminator and a +4 neutral polyethylene liner.    PROCEDURE IN DETAIL:   The patient was met in the holding area and  identified.  The appropriate hip was identified and marked at the operative site.  The patient was then transported to the OR  and  placed under anesthesia.  At that point, the patient was  placed in the lateral decubitus position with the operative side up and  secured to the operating room  table and all bony prominences padded.     The operative lower extremity was prepped from the iliac crest to the distal leg.  Sterile draping was performed.  Time out was performed prior to incision.      A routine posterolateral approach was utilized via sharp dissection  carried down to the subcutaneous tissue.  Gross bleeders were Bovie coagulated.  The iliotibial band was identified and incised along the length of the skin incision.  Self-retaining retractors were  inserted.  With the hip internally rotated, the short external rotators  were identified. The piriformis and capsule was tagged with FiberWire, and the hip capsule released in a T-type fashion.  The femoral neck was exposed, and I resected the femoral neck using the appropriate jig. This was performed at approximately a thumb's breadth above the lesser trochanter.    I then exposed the deep acetabulum, cleared out any tissue including the ligamentum teres.  A wing retractor was placed.  After adequate visualization, I excised the labrum, and then sequentially reamed.  I placed the trial acetabulum, which seated nicely, and then impacted the real cup into place.  Appropriate version and inclination was confirmed clinically matching their bony anatomy, and also with the use of the jig.  I placed a cancellous screw to augment fixation.  A trial polyethylene liner was placed and the wing retractor removed.    I then prepared the proximal femur using the cookie-cutter, the lateralizing reamer, and then sequentially reamed and broached.  A trial broach, neck, and head was utilized, and I  reduced the hip and it was found to have excellent stability with functional range of motion. The trial components were then removed, and the real polyethylene liner was placed.  I then impacted the real femoral prosthesis into place into the appropriate version, slightly anteverted to the normal anatomy, and I impacted the real head ball into place. The hip  was then reduced and taken through functional range of motion and found to have excellent stability. Leg lengths were restored.  I then used a 2 mm drill bits to pass the FiberWire suture from the capsule and piriformis through the greater trochanter, and secured this. Excellent posterior capsular repair was achieved. I also closed the T in the capsule.  I then irrigated the hip copiously again with pulse lavage, and repaired the fascia with Vicryl, followed by Vicryl for the subcutaneous tissue, Monocryl for the skin, Steri-Strips and sterile gauze. The wounds were injected. The patient was then awakened and returned to PACU in stable and satisfactory condition. There were no complications.  Marchia Bond, MD Orthopedic Surgeon [phone]   10/30/2018 3:32 PM

## 2018-10-30 NOTE — Anesthesia Postprocedure Evaluation (Signed)
Anesthesia Post Note  Patient: TEFL teacher  Procedure(s) Performed: TOTAL HIP ARTHROPLASTY (Left Hip)     Patient location during evaluation: PACU Anesthesia Type: Spinal Level of consciousness: oriented and awake and alert Pain management: pain level controlled Vital Signs Assessment: post-procedure vital signs reviewed and stable Respiratory status: spontaneous breathing, respiratory function stable and patient connected to nasal cannula oxygen Cardiovascular status: blood pressure returned to baseline and stable Postop Assessment: no headache, no backache, no apparent nausea or vomiting, spinal receding and patient able to bend at knees Anesthetic complications: no    Last Vitals:  Vitals:   10/30/18 1700 10/30/18 1743  BP:  (!) 128/98  Pulse: 77 89  Resp: 14 18  Temp: 36.7 C 36.7 C  SpO2: 95% 99%    Last Pain:  Vitals:   10/30/18 1829  TempSrc:   PainSc: 8                  Anyeli Hockenbury,W. EDMOND

## 2018-10-30 NOTE — Anesthesia Procedure Notes (Signed)
Spinal  Patient location during procedure: OR Start time: 10/30/2018 1:45 PM End time: 10/30/2018 1:52 PM Staffing Anesthesiologist: Myrtie Soman, MD Performed: anesthesiologist  Preanesthetic Checklist Completed: patient identified, site marked, surgical consent, pre-op evaluation, timeout performed, IV checked, risks and benefits discussed and monitors and equipment checked Spinal Block Patient position: sitting Prep: Betadine Patient monitoring: heart rate, continuous pulse ox and blood pressure Location: L3-4 Injection technique: single-shot Needle Needle type: Sprotte  Needle gauge: 24 G Needle length: 9 cm Additional Notes Expiration date of kit checked and confirmed. Patient tolerated procedure well, without complications.

## 2018-10-30 NOTE — Transfer of Care (Signed)
Immediate Anesthesia Transfer of Care Note  Patient: TEFL teacher  Procedure(s) Performed: TOTAL HIP ARTHROPLASTY (Left Hip)  Patient Location: PACU  Anesthesia Type:Spinal  Level of Consciousness: drowsy  Airway & Oxygen Therapy: Patient Spontanous Breathing and Patient connected to face mask oxygen  Post-op Assessment: Report given to RN and Post -op Vital signs reviewed and stable  Post vital signs: Reviewed and stable  Last Vitals:  Vitals Value Taken Time  BP 112/87 10/30/2018  4:15 PM  Temp    Pulse 67 10/30/2018  4:28 PM  Resp 12 10/30/2018  4:28 PM  SpO2 100 % 10/30/2018  4:28 PM  Vitals shown include unvalidated device data.  Last Pain:  Vitals:   10/30/18 1105  TempSrc:   PainSc: 8       Patients Stated Pain Goal: 4 (10/30/18 1105)  Complications: No apparent anesthesia complications

## 2018-10-30 NOTE — Progress Notes (Signed)
PT Cancellation Note  Patient Details Name: Trevor Thomas MRN: [id number] DOB: 10-Jun-1966   Cancelled Treatment:    Reason Eval/Treat Not Completed: Other (comment)(PT unable to see pt this evening, but RN reached out to PT due to concerns for pt safety with posterior hip precautions. Per RN, pt has been sitting up and attempting to mobilize already this evening, and is insisting his catheter be removed this pm. For safety, PT administered, reviewed, and demoed posterior hip precautions. Pt's significant other at bedside. Pt encouraged not to mobilize without assist. To see pt tomorrow am.)  Nicola Police, PT Acute Rehabilitation Services Pager [phone]  Office [phone]   Tyrone Apple D Despina Hidden 10/30/2018, 8:05 PM

## 2018-10-30 NOTE — Anesthesia Procedure Notes (Signed)
Date/Time: 10/30/2018 1:46 PM Performed by: Florene Route, CRNA Oxygen Delivery Method: Simple face mask

## 2018-10-31 DIAGNOSIS — M1612 Unilateral primary osteoarthritis, left hip: Secondary | ICD-10-CM | POA: Diagnosis not present

## 2018-10-31 LAB — BASIC METABOLIC PANEL
Anion gap: 6 (ref 5–15)
BUN: 12 mg/dL (ref 6–20)
CO2: 25 mmol/L (ref 22–32)
CREATININE: 0.62 mg/dL (ref 0.61–1.24)
Calcium: 8.9 mg/dL (ref 8.9–10.3)
Chloride: 104 mmol/L (ref 98–111)
GFR calc Af Amer: >60 mL/min (ref >60)
GFR calc non Af Amer: >60 mL/min (ref >60)
Glucose, Bld: 150 mg/dL — ABNORMAL HIGH (ref 70–99)
Potassium: 4.2 mmol/L (ref 3.5–5.1)
Sodium: 135 mmol/L (ref 135–145)

## 2018-10-31 LAB — CBC
HCT: 40 % (ref 39.0–52.0)
Hemoglobin: 13 g/dL (ref 13.0–17.0)
MCH: 29.2 pg (ref 26.0–34.0)
MCHC: 32.5 g/dL (ref 30.0–36.0)
MCV: 89.9 fL (ref 80.0–100.0)
Platelets: 266 10*3/uL (ref 150–400)
RBC: 4.45 MIL/uL (ref 4.22–5.81)
RDW: 12.5 % (ref 11.5–15.5)
WBC: 15.1 10*3/uL — ABNORMAL HIGH (ref 4.0–10.5)
nRBC: 0 % (ref 0.0–0.2)

## 2018-10-31 MED ORDER — SENNA-DOCUSATE SODIUM 8.6-50 MG PO TABS: 2.0000 | ORAL_TABLET | Freq: Every day | ORAL | 1 refills | Status: DC

## 2018-10-31 MED ORDER — ASPIRIN EC 325 MG PO TBEC: 325.0000 mg | DELAYED_RELEASE_TABLET | Freq: Two times a day (BID) | ORAL | 0 refills | Status: DC

## 2018-10-31 MED ORDER — OXYCODONE HCL 5 MG PO TABS: 5.0000 mg | ORAL_TABLET | ORAL | 0 refills | Status: DC | PRN
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
## 2018-10-31 NOTE — Progress Notes (Signed)
Patient assisted outside by wheelchair by NT. Fiance with the vehicle. All discharge education reviewed and all questions answered. Vital signs WNL, pain controlled. All belongings sent home with patient.

## 2018-10-31 NOTE — Evaluation (Signed)
Occupational Therapy Evaluation Patient Details Name: Trevor Thomas MRN: [id number] DOB: [birth year] Today's Date: 10/31/2018    History of Present Illness s/p L posterior approach THA.  PMH:  anxiety, COPD, TBI   Clinical Impression   This 53 year old man was admitted for the above.  He was independent with adls at baseline.  Reinforced precautions with him; he does move quickly and will need 24/7 supervision.  Gave him a reacher and long sponge, and wife will assist as needed.    Follow Up Recommendations  Supervision/Assistance - 24 hour    Equipment Recommendations  3 in 1 bedside commode(delivered)    Recommendations for Other Services       Precautions / Restrictions Precautions Precautions: Posterior Hip Restrictions Weight Bearing Restrictions: No      Mobility Bed Mobility               General bed mobility comments: oob  Transfers Overall transfer level: Needs assistance Equipment used: Rolling walker (2 wheeled) Transfers: Sit to/from Stand;Stand Pivot Transfers Sit to Stand: Min guard Stand pivot transfers: Min guard       General transfer comment: cues for UE/LE placement    Balance                                           ADL either performed or assessed with clinical judgement   ADL Overall ADL's : Needs assistance/impaired             Lower Body Bathing: Moderate assistance;Sit to/from stand       Lower Body Dressing: Maximal assistance;Sit to/from stand   Toilet Transfer: Minimal assistance;Stand-pivot;BSC;RW   Toileting- Architect and Hygiene: Min guard;Sit to/from stand         General ADL Comments: pt can perform UB adls with set up/supervision.  Needs cues/supervision for thps.  Educated on reacher and long sponge. Wife will assist as needed also.  Reviewed precautions x 4 times.   Pt ID 90 degrees without cues and other 2 with general cues. Talked about positioning in bed when sidelying.   Educated on using 3:1 as shower seat.       Vision         Perception     Praxis      Pertinent Vitals/Pain Pain Assessment: Faces Faces Pain Scale: Hurts a little bit Pain Location: L hip Pain Descriptors / Indicators: Sore Pain Intervention(s): Limited activity within patient's tolerance;Monitored during session;Premedicated before session;Repositioned     Hand Dominance     Extremity/Trunk Assessment Upper Extremity Assessment Upper Extremity Assessment: Overall WFL for tasks assessed           Communication Communication Communication: No difficulties   Cognition Arousal/Alertness: Awake/alert Behavior During Therapy: WFL for tasks assessed/performed                                   General Comments: needs reinforcement for THPs; tends to move quickly   General Comments       Exercises     Shoulder Instructions      Home Living Family/patient expects to be discharged to:: Private residence Living Arrangements: Spouse/significant other Available Help at Discharge: Family Type of Home: Mobile home             Bathroom Shower/Tub: Walk-in shower  Bathroom Toilet: Standard     Home Equipment: Bedside commode;Walker - 2 wheels(delivered)          Prior Functioning/Environment Level of Independence: Independent                 OT Problem List:        OT Treatment/Interventions:      OT Goals(Current goals can be found in the care plan section) Acute Rehab OT Goals Patient Stated Goal: none stated OT Goal Formulation: All assessment and education complete, DC therapy  OT Frequency:     Barriers to D/C:            Co-evaluation              AM-PAC OT "6 Clicks" Daily Activity     Outcome Measure Help from another person eating meals?: None Help from another person taking care of personal grooming?: A Little Help from another person toileting, which includes using toliet, bedpan, or urinal?: A  Little Help from another person bathing (including washing, rinsing, drying)?: A Lot Help from another person to put on and taking off regular upper body clothing?: A Little Help from another person to put on and taking off regular lower body clothing?: A Lot 6 Click Score: 17   End of Session    Activity Tolerance: Patient tolerated treatment well Patient left: in chair;with call bell/phone within reach;with family/visitor present  OT Visit Diagnosis: Pain Pain - Right/Left: Left Pain - part of body: Hip                Time: 1030-1314 OT Time Calculation (min): 20 min Charges:  OT General Charges $OT Visit: 1 Visit OT Evaluation $OT Eval Low Complexity: 1 Low  Marica Otter, OTR/L Acute Rehabilitation Services [phone] [facility] pager [phone] office 10/31/2018  Tarius Stangelo 10/31/2018, 10:58 AM

## 2018-10-31 NOTE — Evaluation (Signed)
Physical Therapy Evaluation Patient Details Name: Trevor Thomas MRN: [id number] DOB: Nov 17, 1965 Today's Date: 10/31/2018   History of Present Illness  s/p L posterior approach THA.  PMH:  anxiety, COPD, TBI  Clinical Impression  Pt s/p L THR and presents with decreased L LE strength/ROM, post op pain and posterior THP limiting functional mobility.  Pt mobilizing at min guard/supervisioin level this am and eager for dc home this date.    Follow Up Recommendations Home health PT;Follow surgeon's recommendation for DC plan and follow-up therapies    Equipment Recommendations  None recommended by PT    Recommendations for Other Services       Precautions / Restrictions Precautions Precautions: Posterior Hip Restrictions Weight Bearing Restrictions: No      Mobility  Bed Mobility               General bed mobility comments: Pt up in chair and requests back to same.  Pt states minimal difficulty OOB this am  Transfers Overall transfer level: Needs assistance Equipment used: Rolling walker (2 wheeled) Transfers: Sit to/from Stand Sit to Stand: Min guard;Supervision Stand pivot transfers: Min guard       General transfer comment: cues for UE/LE placement and adherence to THP  Ambulation/Gait Ambulation/Gait assistance: Min guard;Supervision Gait Distance (Feet): 150 Feet Assistive device: Rolling walker (2 wheeled) Gait Pattern/deviations: Step-to pattern;Step-through pattern;Decreased step length - right;Decreased step length - left;Shuffle;Trunk flexed     General Gait Details: cues for posture, position from RW, ER on L and initial sequence  Stairs Stairs: Yes Stairs assistance: Min guard Stair Management: Two rails;Step to pattern;Forwards Number of Stairs: 5 General stair comments: cues for sequence  Wheelchair Mobility    Modified Rankin (Stroke Patients Only)       Balance Overall balance assessment: Mild deficits observed, not formally tested                                            Pertinent Vitals/Pain Pain Assessment: Faces Faces Pain Scale: Hurts a little bit Pain Location: L hip Pain Descriptors / Indicators: Sore Pain Intervention(s): Limited activity within patient's tolerance;Monitored during session;Premedicated before session;Ice applied    Home Living Family/patient expects to be discharged to:: Private residence Living Arrangements: Spouse/significant other Available Help at Discharge: Family Type of Home: Mobile home Home Access: Stairs to enter Entrance Stairs-Rails: Right;Left;Can reach both Entrance Stairs-Number of Steps: 5 Home Layout: One level Home Equipment: Bedside commode;Walker - 2 wheels;Cane - single point      Prior Function Level of Independence: Independent               Hand Dominance        Extremity/Trunk Assessment   Upper Extremity Assessment Upper Extremity Assessment: Overall WFL for tasks assessed    Lower Extremity Assessment Lower Extremity Assessment: LLE deficits/detail LLE Deficits / Details: 3-/5 strength at hip with AAROM at hip to 85 flex and 15 abd       Communication   Communication: No difficulties  Cognition Arousal/Alertness: Awake/alert Behavior During Therapy: WFL for tasks assessed/performed                                   General Comments: needs reinforcement for THPs; tends to move quickly      General Comments  Exercises Total Joint Exercises Ankle Circles/Pumps: AROM;Both;15 reps;Supine Quad Sets: AROM;Both;10 reps;Supine Heel Slides: AAROM;Left;20 reps;Supine Hip ABduction/ADduction: AAROM;Left;15 reps;Supine   Assessment/Plan    PT Assessment Patient needs continued PT services  PT Problem List Decreased strength;Decreased range of motion;Decreased activity tolerance;Decreased mobility;Decreased knowledge of use of DME;Pain;Obesity;Decreased knowledge of precautions       PT Treatment  Interventions DME instruction;Gait training;Stair training;Functional mobility training;Therapeutic activities;Therapeutic exercise;Patient/family education    PT Goals (Current goals can be found in the Care Plan section)  Acute Rehab PT Goals Patient Stated Goal: HOME PT Goal Formulation: All assessment and education complete, DC therapy    Frequency 7X/week   Barriers to discharge        Co-evaluation               AM-PAC PT "6 Clicks" Mobility  Outcome Measure Help needed turning from your back to your side while in a flat bed without using bedrails?: A Little Help needed moving from lying on your back to sitting on the side of a flat bed without using bedrails?: A Little Help needed moving to and from a bed to a chair (including a wheelchair)?: A Little Help needed standing up from a chair using your arms (e.g., wheelchair or bedside chair)?: A Little Help needed to walk in hospital room?: A Little Help needed climbing 3-5 steps with a railing? : A Little 6 Click Score: 18    End of Session Equipment Utilized During Treatment: Gait belt Activity Tolerance: Patient tolerated treatment well Patient left: in chair;with call bell/phone within reach;with family/visitor present Nurse Communication: Mobility status PT Visit Diagnosis: Difficulty in walking, not elsewhere classified (R26.2)    Time: 7543-6067 PT Time Calculation (min) (ACUTE ONLY): 34 min   Charges:   PT Evaluation $PT Eval Low Complexity: 1 Low          Mauro Kaufmann PT Acute Rehabilitation Services Pager [phone] Office [phone]   Yamin Swingler 10/31/2018, 12:24 PM

## 2018-10-31 NOTE — Care Management Note (Signed)
Case Management Note  Patient Details  Name: Trevor Thomas MRN: [id number] Date of Birth: [birth year]  Subjective/Objective:    Discharge planning, spoke with patient and spouse at bedside. Have chosen [facility] PT, evaluate and treat.                 Action/Plan: Contacted [facility] for referral. They have accepted. Needs RW and 3n1, [employer] to deliver to room. [phone]  Expected Discharge Date:  10/31/18               Expected Discharge Plan:  Home w Home Health Services  In-House Referral:  NA  Discharge planning Services  CM Consult  Post Acute Care Choice:  Home Health Choice offered to:  Patient, Spouse  DME Arranged:  3-N-1, Walker rolling DME Agency:  [employer]  HH Arranged:  PT HH Agency:  [facility]  Status of Service:  Completed, signed off  If discussed at [employer] [facility] [employer], dates discussed:    Additional Comments:  Alexis Goodell, RN 10/31/2018, 11:01 AM

## 2018-10-31 NOTE — Progress Notes (Signed)
Patient is non compliant with posterior hip precaution despite teaching. Patient not keeping pillows between his legs while turning. Educated the risks, verbalize understanding. Will monitor.

## 2018-10-31 NOTE — Discharge Instructions (Signed)
Total Hip Replacement, Posterior, Care After This sheet gives you information about how to care for yourself after your procedure. Your health care provider may also give you more specific instructions. If you have problems or questions, contact your health care provider. What can I expect after the procedure? After the procedure, it is common to have:  Pain and swelling.  A small amount of blood or clear fluid coming from your incision. Follow these instructions at home: Medicines  Take over-the-counter and prescription medicines only as told by your health care provider.  If you were prescribed a blood thinner (anticoagulant), take it as told by your health care provider. Activity  Ask your health care provider what activities are safe for you.  Do exercises as told by your health care provider or physical therapist.  Rest often, but move around as much as you can tolerate. Movement helps you heal and helps to prevent stiffness, skin sores, and blood clots.  Do not use your legs to support (bear) your body weight until your health care provider approves. Follow instructions about how much weight you may safely support on your affected leg (weight-bearing restrictions).  Use crutches or a walker as told by your health care provider. Movement restrictions   To help prevent hip dislocation, follow instructions about movement restrictions as told by your health care provider. For example: 1. Do not cross your legs at the knees. To remind yourself about this, you may keep a pillow between your legs while lying in bed. 2. Do not bend at the hip and waist or bend farther than 90 degrees of hip flexion. To avoid bending this far:  Do not bring your knees higher than your hips.  Do not pick up something from the floor while sitting in a chair.  Avoid sitting in low chairs.  Use a raised toilet seat.  When standing up from a seated position, keep the injured leg out in front of  you. 3. Avoid twisting at your waist and reaching across your body to the side of the affected leg. 4. Avoid rotating your toes inward on the affected leg.  When getting into a car: 1. Raise the seat as high as possible, move the seat as far back as it will go, and recline the upper part of the seat slightly. 2. Sit down into the seat with your injured leg extended out of the car. 3. Scoot back in the seat as you move the lower half of your body into the car. Try to avoid bumping your foot or leg as you bring it into the car. To make this motion smooth and easy on a cloth seat, try placing a plastic bag on the seat. Incision care   Follow instructions from your health care provider about caring for your incision. Make sure you: ? Wash your hands with soap and water before you change your bandage (dressing). If soap and water are not available, use alcohol-based hand sanitizer. ? Change your dressing as told by your health care provider. ? Leave stitches (sutures), skin glue, or adhesive strips in place. These skin closures may need to stay in place for 2 weeks or longer. If adhesive strip edges start to loosen and curl up, you may trim the loose edges. Do not remove adhesive strips completely unless your health care provider tells you to do that.  Do not take baths, swim, or use a hot tub until your health care provider approves.  Check your incision area  every day for signs of infection. Check for: ? More redness, swelling, or pain. ? More fluid or blood. ? Warmth. ? Pus or a bad smell. Managing pain, stiffness, and swelling   If directed, put ice on the incision area. ? Put ice in a plastic bag. ? Place a towel between your skin and the bag. ? Leave the ice on for 20 minutes, 2-3 times a day.  Move your toes often to avoid stiffness and to lessen swelling.  Raise (elevate) your leg above the level of your heart while you are sitting or lying down. Driving  Ask your health care  provider when it is safe to drive. This may not be recommended for up to 6 weeks after surgery.  Do not drive or use heavy machinery while taking prescription pain medicine. General instructions  Do not use any products that contain nicotine or tobacco, such as cigarettes and e-cigarettes. These can delay bone healing. If you need help quitting, ask your health care provider.  Before you have any dental procedures done, including teeth cleaning, tell your health care provider.  Wear compression stockings as told by your health care provider. These stockings help to prevent blood clots and reduce swelling in your legs.  If you are taking prescription pain medicine, take actions to prevent or treat constipation. Your health care provider may recommend that you: ? Drink enough fluid to keep your urine pale yellow. ? Eat foods that are high in fiber, such as fresh fruits and vegetables, whole grains, and beans. ? Limit foods that are high in fat and processed sugars, such as fried or sweet foods. ? Take an over-the-counter or prescription medicine for constipation.  Keep all follow-up visits as told by your health care provider. This is important. Contact a health care provider if:  You have difficulty breathing.  You have either of these at your incision site: ? Any symptoms of infection. ? Persistent bleeding.  Your incision breaks open after sutures (stitches) or staples are removed.  You have a fever. Get help right away if:  You have more redness, swelling, or pain in the leg you had surgery on.  You have pain or swelling in your calf or thigh.  You have shortness of breath or chest pain.  You have pain that is not relieved by pain medicine. Summary  Do not use your legs to support your body weight until your health care provider approves. Use crutches or a walker as told.  To help prevent hip dislocation, follow instructions about movement restrictions as told.  If you  were prescribed a blood thinner (anticoagulant), take it as told by your health care provider.  Keep all follow-up visits as told by your health care provider. This is important. This information is not intended to replace advice given to you by your health care provider. Make sure you discuss any questions you have with your health care provider. Document Released: 04/08/2005 Document Revised: 06/14/2017 Document Reviewed: 06/14/2017 Elsevier Interactive Patient Education  2019 [employer] INSTRUCTIONS AFTER JOINT REPLACEMENT   o Remove items at home which could result in a fall. This includes throw rugs or furniture in walking pathways o ICE to the affected joint every three hours while awake for 30 minutes at a time, for at least the first 3-5 days, and then as needed for pain and swelling.  Continue to use ice for pain and swelling. You may notice swelling that will progress down to the foot and ankle.  This is normal after surgery.  Elevate your leg when you are not up walking on it.   o Continue to use the breathing machine you got in the hospital (incentive spirometer) which will help keep your temperature down.  It is common for your temperature to cycle up and down following surgery, especially at night when you are not up moving around and exerting yourself.  The breathing machine keeps your lungs expanded and your temperature down.   DIET:  As you were doing prior to hospitalization, we recommend a well-balanced diet.  DRESSING / WOUND CARE / SHOWERING  You may change your dressing 3-5 days after surgery.  Then change the dressing every day with sterile gauze.  Please use good hand washing techniques before changing the dressing.  Do not use any lotions or creams on the incision until instructed by your surgeon.  ACTIVITY  o Increase activity slowly as tolerated, but follow the weight bearing instructions below.   o No driving for 6 weeks or until further direction given by your  physician.  You cannot drive while taking narcotics.  o No lifting or carrying greater than 10 lbs. until further directed by your surgeon. o Avoid periods of inactivity such as sitting longer than an hour when not asleep. This helps prevent blood clots.  o You may return to work once you are authorized by your doctor.     WEIGHT BEARING   Weight bearing as tolerated with assist device (walker, cane, etc) as directed, use it as long as suggested by your surgeon or therapist, typically at least 4-6 weeks.   EXERCISES  Results after joint replacement surgery are often greatly improved when you follow the exercise, range of motion and muscle strengthening exercises prescribed by your doctor. Safety measures are also important to protect the joint from further injury. Any time any of these exercises cause you to have increased pain or swelling, decrease what you are doing until you are comfortable again and then slowly increase them. If you have problems or questions, call your caregiver or physical therapist for advice.   Rehabilitation is important following a joint replacement. After just a few days of immobilization, the muscles of the leg can become weakened and shrink (atrophy).  These exercises are designed to build up the tone and strength of the thigh and leg muscles and to improve motion. Often times heat used for twenty to thirty minutes before working out will loosen up your tissues and help with improving the range of motion but do not use heat for the first two weeks following surgery (sometimes heat can increase post-operative swelling).   These exercises can be done on a training (exercise) mat, on the floor, on a table or on a bed. Use whatever works the best and is most comfortable for you.    Use music or television while you are exercising so that the exercises are a pleasant break in your day. This will make your life better with the exercises acting as a break in your routine that  you can look forward to.   Perform all exercises about fifteen times, three times per day or as directed.  You should exercise both the operative leg and the other leg as well.  Exercises include:    Quad Sets - Tighten up the muscle on the front of the thigh (Quad) and hold for 5-10 seconds.    Straight Leg Raises - With your knee straight (if you were given a brace,  keep it on), lift the leg to 60 degrees, hold for 3 seconds, and slowly lower the leg.  Perform this exercise against resistance later as your leg gets stronger.   Leg Slides: Lying on your back, slowly slide your foot toward your buttocks, bending your knee up off the floor (only go as far as is comfortable). Then slowly slide your foot back down until your leg is flat on the floor again.   Angel Wings: Lying on your back spread your legs to the side as far apart as you can without causing discomfort.   Hamstring Strength:  Lying on your back, push your heel against the floor with your leg straight by tightening up the muscles of your buttocks.  Repeat, but this time bend your knee to a comfortable angle, and push your heel against the floor.  You may put a pillow under the heel to make it more comfortable if necessary.   A rehabilitation program following joint replacement surgery can speed recovery and prevent re-injury in the future due to weakened muscles. Contact your doctor or a physical therapist for more information on knee rehabilitation.    CONSTIPATION  Constipation is defined medically as fewer than three stools per week and severe constipation as less than one stool per week.  Even if you have a regular bowel pattern at home, your normal regimen is likely to be disrupted due to multiple reasons following surgery.  Combination of anesthesia, postoperative narcotics, change in appetite and fluid intake all can affect your bowels.   YOU MUST use at least one of the following options; they are listed in order of  increasing strength to get the job done.  They are all available over the counter, and you may need to use some, POSSIBLY even all of these options:    Drink plenty of fluids (prune juice may be helpful) and high fiber foods Colace 100 mg by mouth twice a day  Senokot for constipation as directed and as needed Dulcolax (bisacodyl), take with full glass of water  Miralax (polyethylene glycol) once or twice a day as needed.  If you have tried all these things and are unable to have a bowel movement in the first 3-4 days after surgery call either your surgeon or your primary doctor.    If you experience loose stools or diarrhea, hold the medications until you stool forms back up.  If your symptoms do not get better within 1 week or if they get worse, check with your doctor.  If you experience "the worst abdominal pain ever" or develop nausea or vomiting, please contact the office immediately for further recommendations for treatment.   ITCHING:  If you experience itching with your medications, try taking only a single pain pill, or even half a pain pill at a time.  You can also use Benadryl over the counter for itching or also to help with sleep.   TED HOSE STOCKINGS:  Use stockings on both legs until for at least 2 weeks or as directed by physician office. They may be removed at night for sleeping.  MEDICATIONS:  See your medication summary on the After Visit Summary that nursing will review with you.  You may have some home medications which will be placed on hold until you complete the course of blood thinner medication.  It is important for you to complete the blood thinner medication as prescribed.  PRECAUTIONS:  If you experience chest pain or shortness of breath - call 911  immediately for transfer to the hospital emergency department.   If you develop a fever greater that 101 F, purulent drainage from wound, increased redness or drainage from wound, foul odor from the wound/dressing, or  calf pain - CONTACT YOUR SURGEON.                                                   FOLLOW-UP APPOINTMENTS:  If you do not already have a post-op appointment, please call the office for an appointment to be seen by your surgeon.  Guidelines for how soon to be seen are listed in your After Visit Summary, but are typically between 1-4 weeks after surgery.  OTHER INSTRUCTIONS:   Knee Replacement:  Do not place pillow under knee, focus on keeping the knee straight while resting. CPM instructions: 0-90 degrees, 2 hours in the morning, 2 hours in the afternoon, and 2 hours in the evening. Place foam block, curve side up under heel at all times except when in CPM or when walking.  DO NOT modify, tear, cut, or change the foam block in any way.  MAKE SURE YOU:   Understand these instructions.   Get help right away if you are not doing well or get worse.    Thank you for letting us be a part of your medical care team.  It is a privilege we respect greatly.  We hope these instructions will help you stay on track for a fast and full recovery!

## 2018-10-31 NOTE — Plan of Care (Signed)
  Problem: Education: Goal: Knowledge of General Education information will improve Description: Including pain rating scale, medication(s)/side effects and non-pharmacologic comfort measures Outcome: Progressing   Problem: Clinical Measurements: Goal: Will remain free from infection Outcome: Progressing   Problem: Coping: Goal: Level of anxiety will decrease Outcome: Progressing   Problem: Elimination: Goal: Will not experience complications related to urinary retention Outcome: Progressing   

## 2018-11-08 ENCOUNTER — Encounter (HOSPITAL_COMMUNITY): Payer: Self-pay | Admitting: Orthopedic Surgery

## 2018-11-09 ENCOUNTER — Encounter (HOSPITAL_COMMUNITY): Payer: Self-pay | Admitting: Orthopedic Surgery

## 2018-11-17 ENCOUNTER — Emergency Department (HOSPITAL_COMMUNITY)
Admission: EM | Admit: 2018-11-17 | Discharge: 2018-11-17 | Disposition: A | Discharge status: Home / Self Care | Payer: Medicaid Other | Attending: Emergency Medicine | Admitting: Emergency Medicine

## 2018-11-17 ENCOUNTER — Other Ambulatory Visit: Payer: Self-pay

## 2018-11-17 ENCOUNTER — Emergency Department (HOSPITAL_COMMUNITY): Payer: Medicaid Other

## 2018-11-17 ENCOUNTER — Encounter (HOSPITAL_COMMUNITY): Payer: Self-pay

## 2018-11-17 DIAGNOSIS — M545 Low back pain, unspecified: Secondary | ICD-10-CM

## 2018-11-17 DIAGNOSIS — I10 Essential (primary) hypertension: Secondary | ICD-10-CM | POA: Diagnosis not present

## 2018-11-17 DIAGNOSIS — F17210 Nicotine dependence, cigarettes, uncomplicated: Secondary | ICD-10-CM | POA: Insufficient documentation

## 2018-11-17 DIAGNOSIS — Z96642 Presence of left artificial hip joint: Secondary | ICD-10-CM | POA: Diagnosis not present

## 2018-11-17 DIAGNOSIS — J449 Chronic obstructive pulmonary disease, unspecified: Secondary | ICD-10-CM | POA: Insufficient documentation

## 2018-11-17 DIAGNOSIS — F329 Major depressive disorder, single episode, unspecified: Secondary | ICD-10-CM | POA: Diagnosis not present

## 2018-11-17 DIAGNOSIS — F419 Anxiety disorder, unspecified: Secondary | ICD-10-CM | POA: Insufficient documentation

## 2018-11-17 DIAGNOSIS — F1290 Cannabis use, unspecified, uncomplicated: Secondary | ICD-10-CM | POA: Diagnosis not present

## 2018-11-17 MED ORDER — KETOROLAC TROMETHAMINE 30 MG/ML IJ SOLN
30.0000 mg | Freq: Once | INTRAMUSCULAR | Status: AC
  Administered 2018-11-17: 30 mg via INTRAMUSCULAR
  Filled 2018-11-17: qty 1

## 2018-11-17 MED ORDER — METHOCARBAMOL 500 MG PO TABS
1000.0000 mg | ORAL_TABLET | Freq: Once | ORAL | Status: AC
  Administered 2018-11-17: 1000 mg via ORAL
  Filled 2018-11-17: qty 2

## 2018-11-17 NOTE — ED Notes (Signed)
Bed: WA01 Expected date:  Expected time:  Means of arrival:  Comments: 53 yo hip dislocation?

## 2018-11-17 NOTE — ED Triage Notes (Signed)
Pt BIBA from home.  Pt reports L hip replacement 2 weeks ago, ambulatory w/o walker x1 week.  Pt reports walking last night and pivoted slightly "felt a pop" Denies pain, reports discomfort.  Hx of subaxone use, has not been taking prescribed pain meds, uses tylenol PRN.

## 2018-11-17 NOTE — Discharge Instructions (Addendum)
You were evaluated in the Emergency Department and after careful evaluation, we did not find any emergent condition requiring admission or further testing in the hospital.  Your symptoms today seem to be due to muscle sprain or spasm of the back.  The Xray of your hip today was normal.  Please return to the Emergency Department if you experience any worsening of your condition.  We encourage you to follow up with a primary care provider.  Thank you for allowing [place] to be a part of your care.

## 2018-11-17 NOTE — ED Triage Notes (Signed)
Patient arrived via [facility] from home. Patient is AOx4 and ambulatory at baseline however no currently due to Left Hip Injury / pain. Patient walked into kitchen last night and patient went to go throw something in trash and felt something pop. Patient layed in bed all night with no relief and has now began to feel numbness and tingling in lower back and left leg. Patient denied pain meds due to being on suboxone.
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
## 2018-11-17 NOTE — ED Notes (Signed)
ED Provider at bedside. 

## 2018-11-17 NOTE — ED Notes (Signed)
Patient transported to X-ray 

## 2020-01-29 IMAGING — CR DG HIP (WITH OR WITHOUT PELVIS) 2-3V*L*
3 series · 3 of 3 positions shown · non-contrast
Comparison: 06/15/2018

CLINICAL DATA: Pt had posterior total hip replacement on [DATE]. Is
c/o pain to posterior left hip onset last night, denies trauma or
fall.

EXAM:
DG HIP (WITH OR WITHOUT PELVIS) 2-3V LEFT

[t pelvis ap]
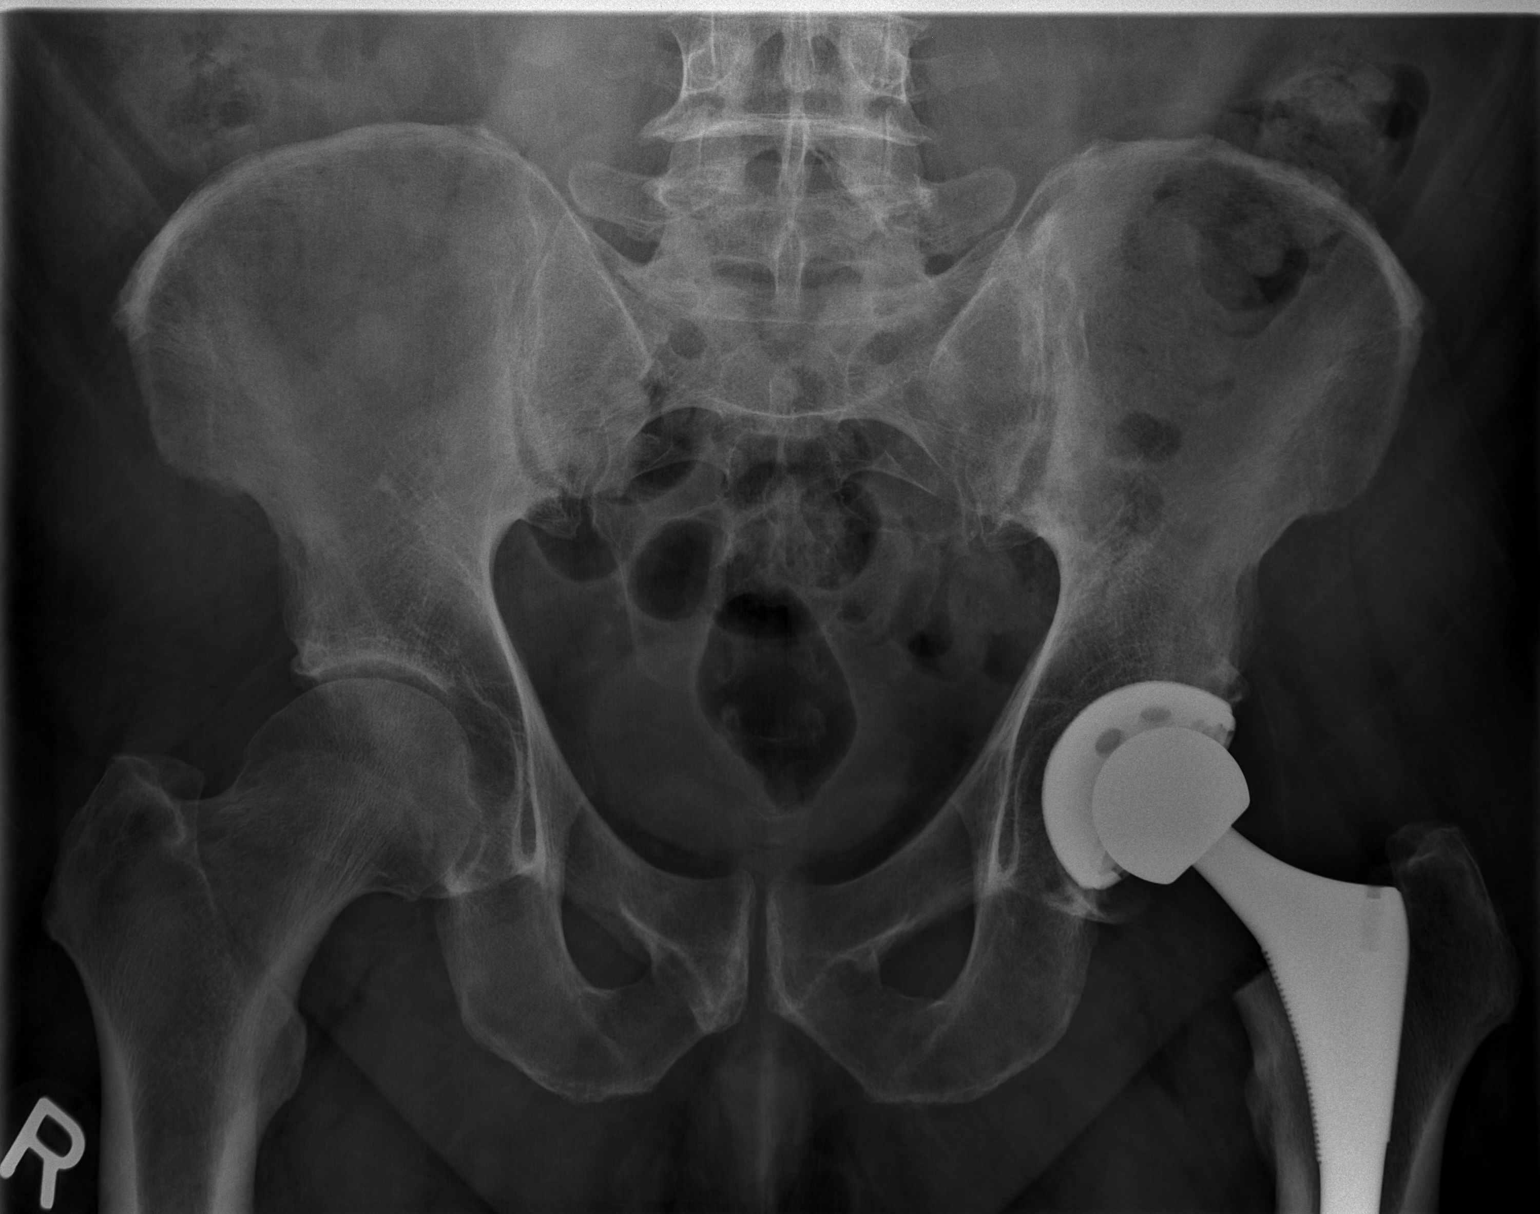

[t hip ap left]
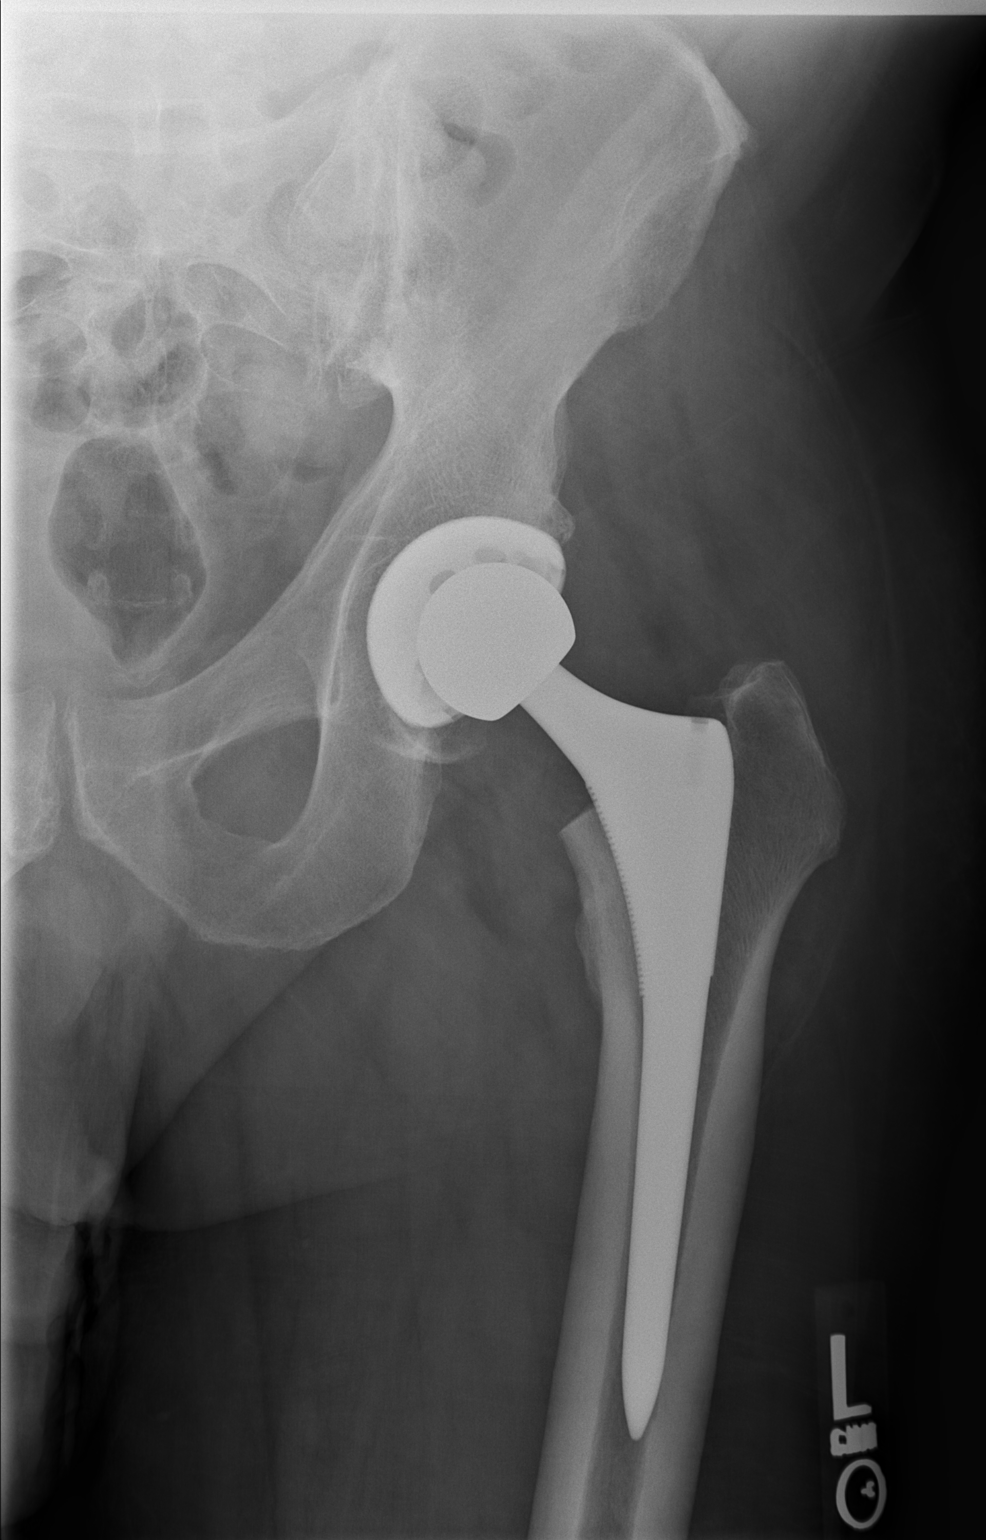

[t hip frog leg left]
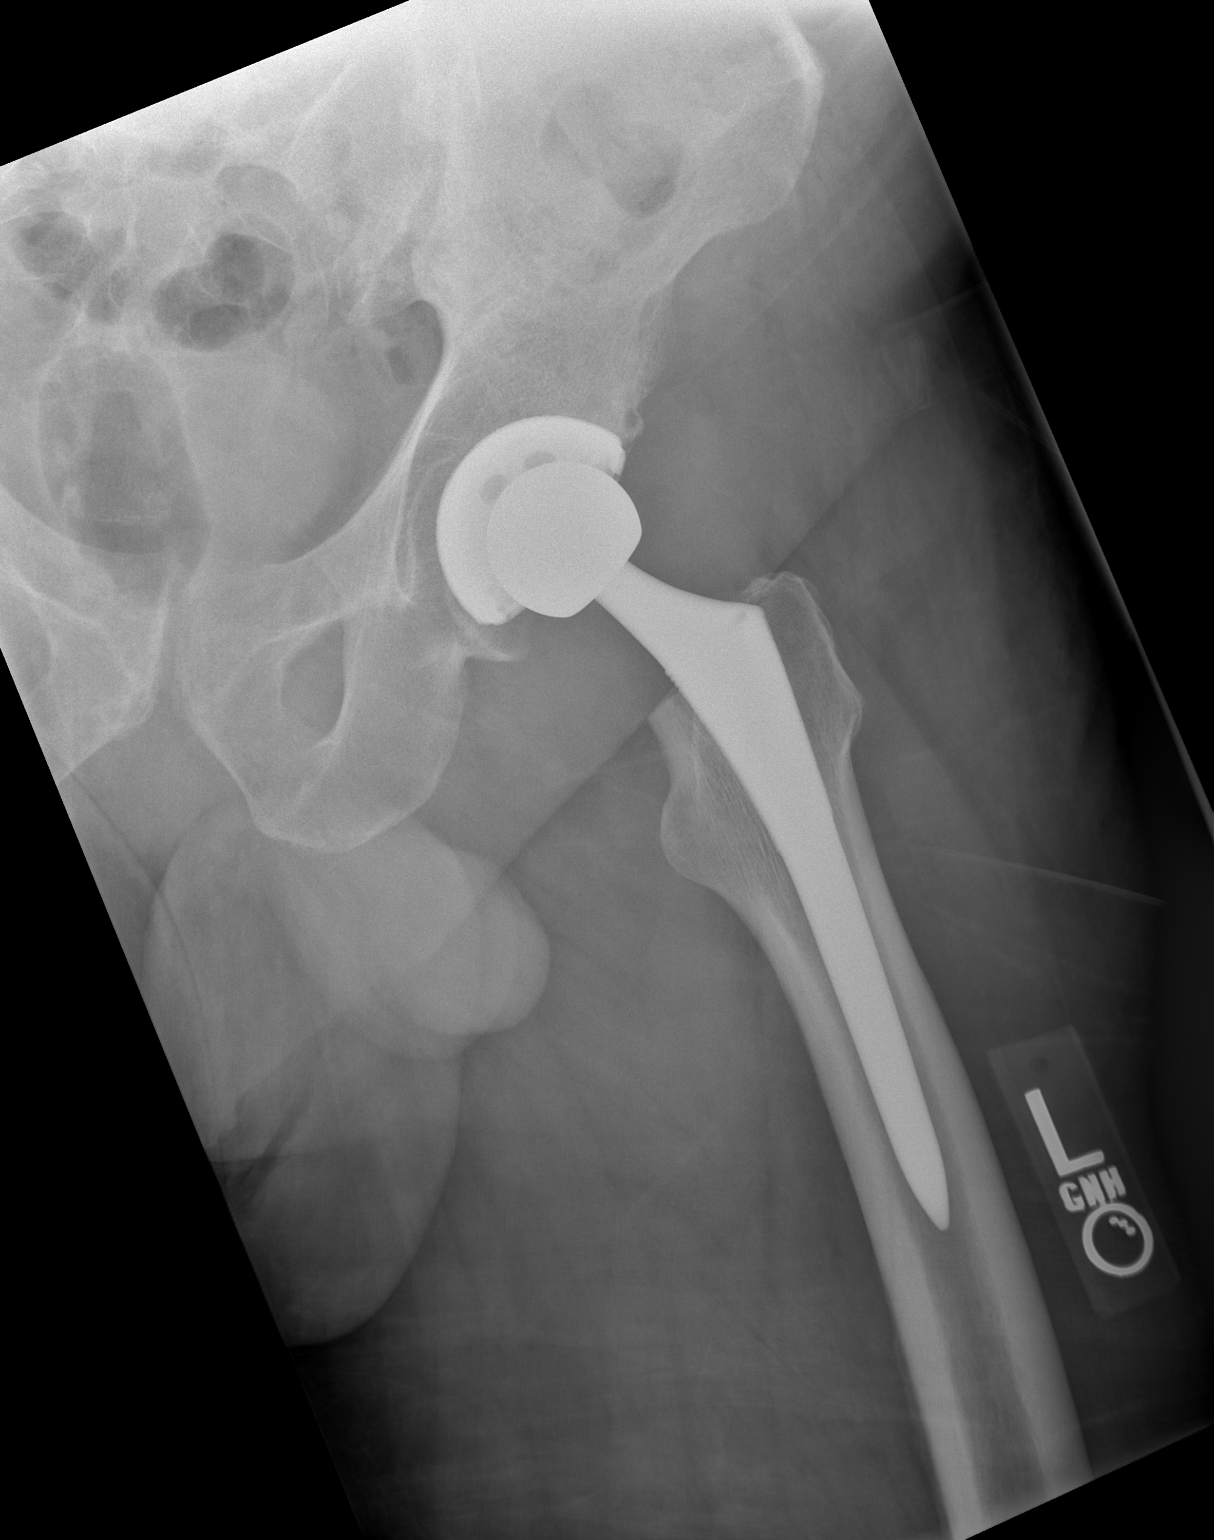

[3 of 3 positions shown; findings below may reference images not displayed]

FINDINGS: Left hip total arthroplasty appears well seated and well aligned. No
acute fracture. No evidence of loosening of the orthopedic hardware.

No pelvic fracture or bone lesion. Mild right hip joint
arthropathic/degenerative changes stable from the prior exam.

Soft tissues are unremarkable.
IMPRESSION: 1. No fracture or bone lesion.
2. Left hip total arthroplasty appears well seated and aligned. No
evidence of prosthesis loosening.
3. Mild right hip joint degenerative/arthropathic change.

## 2020-06-15 ENCOUNTER — Emergency Department (HOSPITAL_COMMUNITY)
Admission: EM | Admit: 2020-06-15 | Discharge: 2020-06-15 | Disposition: A | Discharge status: Home / Self Care | Payer: Medicaid Other | Attending: Emergency Medicine | Admitting: Emergency Medicine

## 2020-06-15 ENCOUNTER — Encounter (HOSPITAL_COMMUNITY): Payer: Self-pay | Admitting: Pharmacy Technician

## 2020-06-15 ENCOUNTER — Other Ambulatory Visit: Payer: Self-pay

## 2020-06-15 ENCOUNTER — Emergency Department (HOSPITAL_COMMUNITY): Payer: Medicaid Other

## 2020-06-15 DIAGNOSIS — M5432 Sciatica, left side: Secondary | ICD-10-CM | POA: Insufficient documentation

## 2020-06-15 DIAGNOSIS — Z7982 Long term (current) use of aspirin: Secondary | ICD-10-CM | POA: Insufficient documentation

## 2020-06-15 DIAGNOSIS — M25552 Pain in left hip: Secondary | ICD-10-CM | POA: Diagnosis not present

## 2020-06-15 DIAGNOSIS — I10 Essential (primary) hypertension: Secondary | ICD-10-CM | POA: Insufficient documentation

## 2020-06-15 DIAGNOSIS — J449 Chronic obstructive pulmonary disease, unspecified: Secondary | ICD-10-CM | POA: Insufficient documentation

## 2020-06-15 DIAGNOSIS — Z79899 Other long term (current) drug therapy: Secondary | ICD-10-CM | POA: Insufficient documentation

## 2020-06-15 DIAGNOSIS — Z96642 Presence of left artificial hip joint: Secondary | ICD-10-CM | POA: Insufficient documentation

## 2020-06-15 DIAGNOSIS — F1590 Other stimulant use, unspecified, uncomplicated: Secondary | ICD-10-CM | POA: Insufficient documentation

## 2020-06-15 DIAGNOSIS — F17210 Nicotine dependence, cigarettes, uncomplicated: Secondary | ICD-10-CM | POA: Insufficient documentation

## 2020-06-15 DIAGNOSIS — M79605 Pain in left leg: Secondary | ICD-10-CM | POA: Diagnosis not present

## 2020-06-15 DIAGNOSIS — M545 Low back pain: Secondary | ICD-10-CM | POA: Diagnosis present

## 2020-06-15 MED ORDER — FENTANYL CITRATE (PF) 100 MCG/2ML IJ SOLN
100.0000 ug | Freq: Once | INTRAMUSCULAR | Status: AC
  Administered 2020-06-15: 100 ug via INTRAVENOUS
  Filled 2020-06-15: qty 2

## 2020-06-15 MED ORDER — KETOROLAC TROMETHAMINE 30 MG/ML IJ SOLN
15.0000 mg | Freq: Once | INTRAMUSCULAR | Status: AC
  Administered 2020-06-15: 15 mg via INTRAVENOUS
  Filled 2020-06-15: qty 1

## 2020-06-15 NOTE — ED Triage Notes (Signed)
Pt bib ems from home with ? Hip dislocation. Pt with L hip replacement a year ago, states since then he has had intermittent episodes where "it didn't feel right". Yesterday morning pt states the hip went out while he was going down the stairs. Pt was unable to ambulate since yesterday afternoon. Given fentanyl pta. VSS with EMS. PMS intact.
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
## 2020-06-15 NOTE — ED Notes (Signed)
Patient Alert and oriented to baseline. Stable and ambulatory to baseline. Patient verbalized understanding of the discharge instructions.  Patient belongings were taken by the patient.   

## 2020-06-15 NOTE — Discharge Instructions (Signed)

## 2020-08-28 ENCOUNTER — Other Ambulatory Visit: Payer: Self-pay

## 2020-08-28 ENCOUNTER — Emergency Department (HOSPITAL_COMMUNITY)
Admission: EM | Admit: 2020-08-28 | Discharge: 2020-08-28 | Disposition: A | Discharge status: Home / Self Care | Payer: Medicaid Other | Attending: Emergency Medicine | Admitting: Emergency Medicine

## 2020-08-28 ENCOUNTER — Emergency Department (HOSPITAL_COMMUNITY): Payer: Medicaid Other

## 2020-08-28 ENCOUNTER — Encounter (HOSPITAL_COMMUNITY): Payer: Self-pay | Admitting: Emergency Medicine

## 2020-08-28 DIAGNOSIS — Z79899 Other long term (current) drug therapy: Secondary | ICD-10-CM | POA: Diagnosis not present

## 2020-08-28 DIAGNOSIS — Z96642 Presence of left artificial hip joint: Secondary | ICD-10-CM | POA: Insufficient documentation

## 2020-08-28 DIAGNOSIS — J449 Chronic obstructive pulmonary disease, unspecified: Secondary | ICD-10-CM | POA: Diagnosis not present

## 2020-08-28 DIAGNOSIS — F17210 Nicotine dependence, cigarettes, uncomplicated: Secondary | ICD-10-CM | POA: Diagnosis not present

## 2020-08-28 DIAGNOSIS — Z7982 Long term (current) use of aspirin: Secondary | ICD-10-CM | POA: Diagnosis not present

## 2020-08-28 DIAGNOSIS — R0789 Other chest pain: Secondary | ICD-10-CM | POA: Diagnosis not present

## 2020-08-28 DIAGNOSIS — R079 Chest pain, unspecified: Secondary | ICD-10-CM

## 2020-08-28 DIAGNOSIS — I10 Essential (primary) hypertension: Secondary | ICD-10-CM | POA: Diagnosis not present

## 2020-08-28 LAB — CBC
HCT: 47.5 % (ref 39.0–52.0)
Hemoglobin: 15.7 g/dL (ref 13.0–17.0)
MCH: 30.7 pg (ref 26.0–34.0)
MCHC: 33.1 g/dL (ref 30.0–36.0)
MCV: 92.8 fL (ref 80.0–100.0)
Platelets: 287 10*3/uL (ref 150–400)
RBC: 5.12 MIL/uL (ref 4.22–5.81)
RDW: 12.3 % (ref 11.5–15.5)
WBC: 11.4 10*3/uL — ABNORMAL HIGH (ref 4.0–10.5)
nRBC: 0 % (ref 0.0–0.2)

## 2020-08-28 LAB — TROPONIN I (HIGH SENSITIVITY)
Troponin I (High Sensitivity): 5 ng/L (ref <18)
Troponin I (High Sensitivity): 4 ng/L (ref <18)

## 2020-08-28 LAB — BASIC METABOLIC PANEL
Anion gap: 10 (ref 5–15)
BUN: 15 mg/dL (ref 6–20)
CO2: 25 mmol/L (ref 22–32)
Calcium: 9.4 mg/dL (ref 8.9–10.3)
Chloride: 101 mmol/L (ref 98–111)
Creatinine, Ser: 0.67 mg/dL (ref 0.61–1.24)
GFR, Estimated: >60 mL/min (ref >60)
Glucose, Bld: 100 mg/dL — ABNORMAL HIGH (ref 70–99)
Potassium: 3.8 mmol/L (ref 3.5–5.1)
Sodium: 136 mmol/L (ref 135–145)

## 2020-08-28 LAB — D-DIMER, QUANTITATIVE: D-Dimer, Quant: <0.27 ug/mL-FEU (ref 0.00–0.50)

## 2020-08-28 MED ORDER — ACETAMINOPHEN 500 MG PO TABS
1000.0000 mg | ORAL_TABLET | Freq: Once | ORAL | Status: AC
  Administered 2020-08-28: 1000 mg via ORAL
  Filled 2020-08-28: qty 2

## 2020-08-28 MED ORDER — ALBUTEROL SULFATE HFA 108 (90 BASE) MCG/ACT IN AERS
2.0000 | INHALATION_SPRAY | Freq: Once | RESPIRATORY_TRACT | Status: AC
  Administered 2020-08-28: 2 via RESPIRATORY_TRACT
  Filled 2020-08-28: qty 6.7

## 2020-08-28 NOTE — Care Management (Addendum)
Provided patient with homelessness resources and food pantries in the [place] area.  Patient was also provided a bus pass.  Updated EDP with plan for discharge. No further ED CM needs identified.

## 2020-08-28 NOTE — ED Notes (Signed)
Pt is ambulatory and no complaint of feeling SOB. Pt's O2 saturation remained at 95% when ambulating.

## 2020-08-28 NOTE — ED Notes (Signed)
Pt asking for sandwiches and drinks. Explained to pt that he can't have anything to eat and drink yet. Pt verbalized understanding
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
## 2020-08-28 NOTE — ED Notes (Signed)
Reviewed discharge instructions with patient. Follow-up care reviewed. Patient verbalized understanding. Patient A&Ox4, VSS, and ambulatory with steady gait upon discharge. Pt received housing resources from social work Char[employer] fundraiser as well as a bus pass.

## 2020-08-28 NOTE — Care Management (Signed)
ED RNCM received consult for homelessness resources. ED RNCM will provide [facility] homeless resource guide to patient.

## 2020-08-28 NOTE — ED Triage Notes (Signed)
Pt BIB [facility] from home. Complaint of chest pain and poor oral intake for 2 days. VSS. NAD.

## 2021-06-29 ENCOUNTER — Emergency Department (HOSPITAL_COMMUNITY): Payer: Medicaid Other

## 2021-06-29 ENCOUNTER — Encounter (HOSPITAL_COMMUNITY): Payer: Self-pay | Admitting: Emergency Medicine

## 2021-06-29 ENCOUNTER — Emergency Department (HOSPITAL_COMMUNITY)
Admission: EM | Admit: 2021-06-29 | Discharge: 2021-06-29 | Discharge status: Against Medical Advice | Payer: Medicaid Other | Attending: Emergency Medicine | Admitting: Emergency Medicine

## 2021-06-29 DIAGNOSIS — Z7951 Long term (current) use of inhaled steroids: Secondary | ICD-10-CM | POA: Insufficient documentation

## 2021-06-29 DIAGNOSIS — Z7982 Long term (current) use of aspirin: Secondary | ICD-10-CM | POA: Diagnosis not present

## 2021-06-29 DIAGNOSIS — R059 Cough, unspecified: Secondary | ICD-10-CM | POA: Diagnosis not present

## 2021-06-29 DIAGNOSIS — J449 Chronic obstructive pulmonary disease, unspecified: Secondary | ICD-10-CM | POA: Insufficient documentation

## 2021-06-29 DIAGNOSIS — R0602 Shortness of breath: Secondary | ICD-10-CM | POA: Insufficient documentation

## 2021-06-29 DIAGNOSIS — R0981 Nasal congestion: Secondary | ICD-10-CM | POA: Diagnosis not present

## 2021-06-29 DIAGNOSIS — Z96642 Presence of left artificial hip joint: Secondary | ICD-10-CM | POA: Diagnosis not present

## 2021-06-29 DIAGNOSIS — I10 Essential (primary) hypertension: Secondary | ICD-10-CM | POA: Insufficient documentation

## 2021-06-29 DIAGNOSIS — F17210 Nicotine dependence, cigarettes, uncomplicated: Secondary | ICD-10-CM | POA: Diagnosis not present

## 2021-06-29 DIAGNOSIS — Z20822 Contact with and (suspected) exposure to covid-19: Secondary | ICD-10-CM | POA: Insufficient documentation

## 2021-06-29 DIAGNOSIS — Z79899 Other long term (current) drug therapy: Secondary | ICD-10-CM | POA: Insufficient documentation

## 2021-06-29 DIAGNOSIS — R0682 Tachypnea, not elsewhere classified: Secondary | ICD-10-CM | POA: Diagnosis not present

## 2021-06-29 LAB — CBC WITH DIFFERENTIAL/PLATELET
Abs Immature Granulocytes: 0.02 10*3/uL (ref 0.00–0.07)
Basophils Absolute: 0 10*3/uL (ref 0.0–0.1)
Basophils Relative: 0 %
Eosinophils Absolute: 0 10*3/uL (ref 0.0–0.5)
Eosinophils Relative: 0 %
HCT: 50 % (ref 39.0–52.0)
Hemoglobin: 16.1 g/dL (ref 13.0–17.0)
Immature Granulocytes: 0 %
Lymphocytes Relative: 41 %
Lymphs Abs: 3.9 10*3/uL (ref 0.7–4.0)
MCH: 31 pg (ref 26.0–34.0)
MCHC: 32.2 g/dL (ref 30.0–36.0)
MCV: 96.3 fL (ref 80.0–100.0)
Monocytes Absolute: 0.5 10*3/uL (ref 0.1–1.0)
Monocytes Relative: 5 %
Neutro Abs: 5.1 10*3/uL (ref 1.7–7.7)
Neutrophils Relative %: 54 %
Platelets: 239 10*3/uL (ref 150–400)
RBC: 5.19 MIL/uL (ref 4.22–5.81)
RDW: 12.6 % (ref 11.5–15.5)
WBC: 9.6 10*3/uL (ref 4.0–10.5)
nRBC: 0 % (ref 0.0–0.2)

## 2021-06-29 LAB — COMPREHENSIVE METABOLIC PANEL
ALT: 10 U/L (ref 0–44)
AST: 14 U/L — ABNORMAL LOW (ref 15–41)
Albumin: 3.5 g/dL (ref 3.5–5.0)
Alkaline Phosphatase: 78 U/L (ref 38–126)
Anion gap: 6 (ref 5–15)
BUN: 9 mg/dL (ref 6–20)
CO2: 34 mmol/L — ABNORMAL HIGH (ref 22–32)
Calcium: 8.9 mg/dL (ref 8.9–10.3)
Chloride: 96 mmol/L — ABNORMAL LOW (ref 98–111)
Creatinine, Ser: 0.77 mg/dL (ref 0.61–1.24)
GFR, Estimated: >60 mL/min (ref >60)
Glucose, Bld: 138 mg/dL — ABNORMAL HIGH (ref 70–99)
Potassium: 4.4 mmol/L (ref 3.5–5.1)
Sodium: 136 mmol/L (ref 135–145)
Total Bilirubin: 0.5 mg/dL (ref 0.3–1.2)
Total Protein: 6.9 g/dL (ref 6.5–8.1)

## 2021-06-29 LAB — SARS CORONAVIRUS 2 (TAT 6-24 HRS): SARS Coronavirus 2: NEGATIVE

## 2021-06-29 MED ORDER — ALBUTEROL SULFATE (2.5 MG/3ML) 0.083% IN NEBU: 2.5000 mg | INHALATION_SOLUTION | Freq: Once | RESPIRATORY_TRACT | Status: AC

## 2021-06-29 MED ORDER — ALBUTEROL SULFATE (2.5 MG/3ML) 0.083% IN NEBU
INHALATION_SOLUTION | RESPIRATORY_TRACT | Status: AC
  Administered 2021-06-29: 2.5 mg via RESPIRATORY_TRACT
  Filled 2021-06-29: qty 3

## 2021-06-29 MED ORDER — ALBUTEROL SULFATE HFA 108 (90 BASE) MCG/ACT IN AERS
2.0000 | INHALATION_SPRAY | RESPIRATORY_TRACT | Status: DC | PRN
  Filled 2021-06-29: qty 6.7

## 2021-06-29 NOTE — ED Notes (Signed)
Pt finished the breathing treatment and then stated he had to go. Pt signed AMA form and verbalized understanding of risks. Pt walked out.

## 2021-06-29 NOTE — ED Notes (Signed)
Pt pacing around and stating he wants his IV out so he can leave. IV removed. Pt agreed to let us give him a breathing treatment while his family member is enroute to pick him up. Pt advised his SpO2 is dangerously low and pt is encouraged to stay.

## 2021-06-29 NOTE — ED Notes (Signed)
Pt expressed that he is tired of waiting and would like to leave. MD notified.

## 2021-06-29 NOTE — ED Notes (Signed)
[place], PA-C at bedside explaining to pt that e is leaving against medical advice if he goes.

## 2021-06-29 NOTE — ED Provider Notes (Signed)
Emergency Medicine Provider Triage Evaluation Note  Trevor Thomas , a 55 y.o. male  was evaluated in triage.  Pt complains of shortness of breath.  Patient has been having symptoms for 4 days.  He developed chest tightness, headache, shortness of breath, congestion, cough.  The symptoms gradually worsen.  He was brought here by EMS where they gave him albuterol, 125 of Solu-Medrol, and 1 Atrovent.  He feels much better after his treatment.  His oxygen Was 88% before and is 99% after on 2 L.  He does not wear oxygen at home. He does have some chest tightness but this is likely associated with his cough that he has developed.  He is productive cough no blood noted. He has a history of COPD chronic bronchitis type.  Says that these symptoms feel like his previous exacerbations.  Review of Systems  Positive: Chest tightness, congestion, cough, shortness of breath, headache Negative: Fever, chills, hemoptysis, nausea, vomiting, abdominal pain  Physical Exam  BP 129/82 (BP Location: Right Arm)   Pulse 96   Temp 98.6 F (37 C) (Oral)   Resp 12   SpO2 97%  Gen:   Awake, no distress.  He is on 2 L of nasal cannula. Resp:  Normal effort.  Lungs with coarse sounds bilaterally.  No wheezing noted. MSK:   Moves extremities without difficulty Other:  Heart sounds are regular rate and rhythm with no murmurs heard.  Skin is warm to the touch however no fever noted in triage.  Medical Decision Making  Medically screening exam initiated at 3:22 PM.  Appropriate orders placed.  Stanislav Raia was informed that the remainder of the evaluation will be completed by another provider, this initial triage assessment does not replace that evaluation, and the importance of remaining in the ED until their evaluation is complete.    Therese Sarah 06/29/21 1526    Mancel Bale, MD 06/29/21 [phone]

## 2021-06-29 NOTE — ED Triage Notes (Addendum)
Pt c/o cough/SHOB x4 days, associated CP w cough. Wheezing & rhonchi noted bilaterally. EMS states "warm to touch" but afebrile. Hx COPD  125 solumedrol, 1 atrovent, 10mg  albuterol  20 R hand  131/80 84 88% before tx, 99% after
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
## 2021-06-29 NOTE — ED Notes (Signed)
Pt advised staff that he is unvaccinated against covid, states he got "head cold" from grandchildren

## 2021-10-19 ENCOUNTER — Emergency Department (HOSPITAL_COMMUNITY): Payer: Medicaid Other

## 2021-10-19 ENCOUNTER — Emergency Department (HOSPITAL_COMMUNITY)
Admission: EM | Admit: 2021-10-19 | Discharge: 2021-10-19 | Disposition: A | Discharge status: Home / Self Care | Payer: Medicaid Other | Attending: Emergency Medicine | Admitting: Emergency Medicine

## 2021-10-19 DIAGNOSIS — R062 Wheezing: Secondary | ICD-10-CM | POA: Insufficient documentation

## 2021-10-19 DIAGNOSIS — F17200 Nicotine dependence, unspecified, uncomplicated: Secondary | ICD-10-CM | POA: Diagnosis not present

## 2021-10-19 DIAGNOSIS — R0602 Shortness of breath: Secondary | ICD-10-CM | POA: Insufficient documentation

## 2021-10-19 DIAGNOSIS — Z79899 Other long term (current) drug therapy: Secondary | ICD-10-CM | POA: Insufficient documentation

## 2021-10-19 DIAGNOSIS — Z7982 Long term (current) use of aspirin: Secondary | ICD-10-CM | POA: Diagnosis not present

## 2021-10-19 DIAGNOSIS — R059 Cough, unspecified: Secondary | ICD-10-CM | POA: Diagnosis not present

## 2021-10-19 DIAGNOSIS — J441 Chronic obstructive pulmonary disease with (acute) exacerbation: Secondary | ICD-10-CM

## 2021-10-19 DIAGNOSIS — J209 Acute bronchitis, unspecified: Secondary | ICD-10-CM

## 2021-10-19 DIAGNOSIS — R058 Other specified cough: Secondary | ICD-10-CM

## 2021-10-19 DIAGNOSIS — J189 Pneumonia, unspecified organism: Secondary | ICD-10-CM

## 2021-10-19 DIAGNOSIS — Z20822 Contact with and (suspected) exposure to covid-19: Secondary | ICD-10-CM | POA: Insufficient documentation

## 2021-10-19 LAB — RESP PANEL BY RT-PCR (FLU A&B, COVID) ARPGX2
Influenza A by PCR: NEGATIVE
Influenza B by PCR: NEGATIVE
SARS Coronavirus 2 by RT PCR: NEGATIVE

## 2021-10-19 MED ORDER — IPRATROPIUM BROMIDE 0.02 % IN SOLN: 0.5000 mg | Freq: Once | RESPIRATORY_TRACT | Status: DC

## 2021-10-19 MED ORDER — METHYLPREDNISOLONE SODIUM SUCC 125 MG IJ SOLR
125.0000 mg | Freq: Once | INTRAMUSCULAR | Status: AC
  Administered 2021-10-19: 125 mg via INTRAVENOUS
  Filled 2021-10-19: qty 2

## 2021-10-19 MED ORDER — IPRATROPIUM BROMIDE 0.02 % IN SOLN
0.5000 mg | Freq: Once | RESPIRATORY_TRACT | Status: AC
  Administered 2021-10-19: 0.5 mg via RESPIRATORY_TRACT
  Filled 2021-10-19: qty 2.5

## 2021-10-19 MED ORDER — ALBUTEROL SULFATE (2.5 MG/3ML) 0.083% IN NEBU: 5.0000 mg | INHALATION_SOLUTION | Freq: Once | RESPIRATORY_TRACT | Status: DC

## 2021-10-19 MED ORDER — ALBUTEROL SULFATE (2.5 MG/3ML) 0.083% IN NEBU
5.0000 mg | INHALATION_SOLUTION | Freq: Once | RESPIRATORY_TRACT | Status: AC
Start: 2021-10-19 — End: 2021-10-19
  Administered 2021-10-19: 5 mg via RESPIRATORY_TRACT
  Filled 2021-10-19: qty 6

## 2021-10-19 NOTE — ED Notes (Signed)
Pt states he is leaving [facility], he does not want or need to be here and is not waiting around for blood work. Pt advised of risks of refusal of treatment up to and including death Pt acknowledged same signed [facility] form. Dr Denton Lank Notified.

## 2021-10-19 NOTE — ED Triage Notes (Signed)
SOB, yellow productive cough x 4 days. 85% on RA per EMS. 6LPM [place] at 96%. Alert and oriented x 4. Denies fever/chills. Hx of COPD.

## 2021-10-19 NOTE — ED Provider Notes (Signed)
[facility] EMERGENCY DEPARTMENT Provider Note   CSN: [id number] Arrival date & time: 10/19/21  1854     History  Chief Complaint  Patient presents with   Shortness of Breath    Trevor Thomas is a 56 y.o. male.  Patient with hx copd, c/o increased productive cough, and wheezing/sob in the past 3- 4 days. Symptoms acute onset, moderate, persistent. +smoker. Uses alb tx prn. Denies current steroid use. No sore throat or runny nose. No chest pain. +sob. No leg pain or swelling. No orthopnea or pnd. EMS noted pulse ox in 80s.   The history is provided by the patient, medical records and the EMS personnel.  Shortness of Breath Associated symptoms: cough and wheezing   Associated symptoms: no abdominal pain, no chest pain, no fever, no headaches, no neck pain, no rash, no sore throat and no vomiting       Home Medications Prior to Admission medications   Medication Sig Start Date End Date Taking? Authorizing Provider  acetaminophen (TYLENOL) 325 MG tablet Take 650 mg by mouth every 6 (six) hours as needed for moderate pain.    [provider]  albuterol (PROVENTIL HFA;VENTOLIN HFA) 108 (90 Base) MCG/ACT inhaler Inhale 2 puffs into the lungs every 6 (six) hours as needed for wheezing. 01/30/17   Renne Musca, MD  amLODipine-benazepril (LOTREL) 10-40 MG capsule Take 1 capsule by mouth daily. 06/03/20   [provider]  aspirin EC 325 MG tablet Take 1 tablet (325 mg total) by mouth 2 (two) times daily. Patient not taking: Reported on 08/28/2020 10/31/18   Teryl Lucy, MD  loratadine (CLARITIN) 10 MG tablet Take 10 mg by mouth daily.    [provider]  sennosides-docusate sodium (SENOKOT-S) 8.6-50 MG tablet Take 2 tablets by mouth daily. Patient not taking: Reported on 08/28/2020 10/31/18   Teryl Lucy, MD  SUBOXONE 4-1 MG FILM Place 4 Film under the tongue daily.  06/03/20   [provider]  tiZANidine (ZANAFLEX) 4 MG tablet Take  4 mg by mouth 3 (three) times daily. 08/13/20   [provider]      Allergies    Bee venom and Clonidine derivatives    Review of Systems   Review of Systems  Constitutional:  Negative for chills and fever.  HENT:  Negative for sore throat.   Eyes:  Negative for redness.  Respiratory:  Positive for cough, shortness of breath and wheezing.   Cardiovascular:  Negative for chest pain, palpitations and leg swelling.  Gastrointestinal:  Negative for abdominal pain, diarrhea and vomiting.  Genitourinary:  Negative for dysuria and flank pain.  Musculoskeletal:  Negative for back pain and neck pain.  Skin:  Negative for rash.  Neurological:  Negative for headaches.  Hematological:  Does not bruise/bleed easily.  Psychiatric/Behavioral:  Negative for confusion.    Physical Exam Updated Vital Signs Pulse 92    Temp 98.6 F (37 C) (Oral)    Resp 15    Ht 1.803 m (5\' 11" )    Wt 117.9 kg    SpO2 96%    BMI 36.26 kg/m  Physical Exam Vitals and nursing note reviewed.  Constitutional:      Appearance: Normal appearance. He is well-developed.  HENT:     Head: Atraumatic.     Nose: Nose normal.     Mouth/Throat:     Mouth: Mucous membranes are moist.     Pharynx: Oropharynx is clear. No oropharyngeal exudate or posterior oropharyngeal  erythema.  Eyes:     General: No scleral icterus.    Conjunctiva/sclera: Conjunctivae normal.     Pupils: Pupils are equal, round, and reactive to light.  Neck:     Trachea: No tracheal deviation.  Cardiovascular:     Rate and Rhythm: Normal rate and regular rhythm.     Pulses: Normal pulses.     Heart sounds: Normal heart sounds. No murmur heard.   No friction rub. No gallop.  Pulmonary:     Effort: Respiratory distress present. No accessory muscle usage.     Breath sounds: Wheezing present.  Abdominal:     General: Bowel sounds are normal. There is no distension.     Palpations: Abdomen is soft.     Tenderness: There is no abdominal  tenderness. There is no guarding.  Genitourinary:    Comments: No cva tenderness. Musculoskeletal:        General: No swelling or tenderness.     Cervical back: Normal range of motion and neck supple. No rigidity.     Right lower leg: No edema.     Left lower leg: No edema.  Skin:    General: Skin is warm and dry.     Findings: No rash.  Neurological:     Mental Status: He is alert.     Comments: Alert, speech clear.   Psychiatric:        Mood and Affect: Mood normal.    ED Results / Procedures / Treatments   Labs (all labs ordered are listed, but only abnormal results are displayed) Results for orders placed or performed during the hospital encounter of 10/19/21  Resp Panel by RT-PCR (Flu A&B, Covid) Nasopharyngeal Swab   Specimen: Nasopharyngeal Swab; Nasopharyngeal(NP) swabs in vial transport medium  Result Value Ref Range   SARS Coronavirus 2 by RT PCR NEGATIVE NEGATIVE   Influenza A by PCR NEGATIVE NEGATIVE   Influenza B by PCR NEGATIVE NEGATIVE     EKG EKG Interpretation  Date/Time:  Tuesday October 19 2021 18:54:29 EST Ventricular Rate:  95 PR Interval:  149 QRS Duration: 97 QT Interval:  364 QTC Calculation: 458 R Axis:   76 Text Interpretation: Sinus rhythm Nonspecific T wave abnormality Confirmed by Cathren Laine ([id number]) on 10/19/2021 7:20:56 PM  Radiology DG Chest Port 1 View  Result Date: 10/19/2021 CLINICAL DATA:  Cough with shortness of breath. EXAM: PORTABLE CHEST 1 VIEW COMPARISON:  Chest x-ray 927 1,022. FINDINGS: The heart is enlarged. There is patchy airspace disease in the bilateral lower lungs, left greater than right. Costophrenic angles are clear. No pneumothorax. No acute fractures. IMPRESSION: 1. Bilateral lower lung airspace disease, left greater than right, worrisome for infection. 2. Stable cardiomegaly. Electronically Signed   By: Darliss Cheney M.D.   On: 10/19/2021 19:52    Procedures Procedures    Medications Ordered in ED Medications   albuterol (PROVENTIL) (2.5 MG/3ML) 0.083% nebulizer solution 5 mg (has no administration in time range)  ipratropium (ATROVENT) nebulizer solution 0.5 mg (has no administration in time range)    ED Course/ Medical Decision Making/ A&P                           Medical Decision Making Amount and/or Complexity of Data Reviewed Radiology: ordered.  Risk Prescription drug management.   Iv ns. Continuous pulse ox and cardiac monitoring. Stat labs and imaging.   Albuterol and atrovent treatment. Solumedrol iv.   Considered and planned  for further testing and admission.   Reviewed nursing notes and prior charts for additional history. External reports reviewed.   Labs reviewed/interpreted by me - covid and flu negative  CXR reviewed/interpreted by me - bil infl ?pna  Recheck pt, persistent wheezing - additional albuterol and atrovent tx.   I went to reassess patient again, and patient had left ED AMA (without notifying me) prior to completion of ordered labs, prior to receiving antibiotic therapy, and prior to completion of advised/plan treatment (including admission).  I went to his room, and looked in waiting room/out front, but patient had walked out without enabling conversation about needing to stay, risks of leaving, etc.           Final Clinical Impression(s) / ED Diagnoses Final diagnoses:  None    Rx / DC Orders ED Discharge Orders     None         Cathren Laine, MD 10/20/21 1105

## 2021-10-28 ENCOUNTER — Observation Stay (HOSPITAL_COMMUNITY): Payer: Medicaid Other

## 2021-10-28 ENCOUNTER — Encounter (HOSPITAL_COMMUNITY): Payer: Self-pay | Admitting: Internal Medicine

## 2021-10-28 ENCOUNTER — Emergency Department (HOSPITAL_COMMUNITY): Payer: Medicaid Other

## 2021-10-28 ENCOUNTER — Other Ambulatory Visit (HOSPITAL_COMMUNITY): Payer: Medicaid Other

## 2021-10-28 ENCOUNTER — Observation Stay (HOSPITAL_COMMUNITY)
Admission: EM | Admit: 2021-10-28 | Discharge: 2021-10-28 | Disposition: A | Discharge status: Home / Self Care | Payer: Medicaid Other | Attending: Internal Medicine | Admitting: Internal Medicine

## 2021-10-28 ENCOUNTER — Other Ambulatory Visit: Payer: Self-pay

## 2021-10-28 DIAGNOSIS — F17210 Nicotine dependence, cigarettes, uncomplicated: Secondary | ICD-10-CM | POA: Diagnosis not present

## 2021-10-28 DIAGNOSIS — Z720 Tobacco use: Secondary | ICD-10-CM | POA: Diagnosis present

## 2021-10-28 DIAGNOSIS — Z20822 Contact with and (suspected) exposure to covid-19: Secondary | ICD-10-CM | POA: Diagnosis not present

## 2021-10-28 DIAGNOSIS — I2609 Other pulmonary embolism with acute cor pulmonale: Secondary | ICD-10-CM | POA: Diagnosis present

## 2021-10-28 DIAGNOSIS — K829 Disease of gallbladder, unspecified: Secondary | ICD-10-CM

## 2021-10-28 DIAGNOSIS — E669 Obesity, unspecified: Secondary | ICD-10-CM | POA: Diagnosis not present

## 2021-10-28 DIAGNOSIS — E441 Mild protein-calorie malnutrition: Secondary | ICD-10-CM | POA: Diagnosis not present

## 2021-10-28 DIAGNOSIS — I10 Essential (primary) hypertension: Secondary | ICD-10-CM | POA: Diagnosis not present

## 2021-10-28 DIAGNOSIS — Z79899 Other long term (current) drug therapy: Secondary | ICD-10-CM | POA: Diagnosis not present

## 2021-10-28 DIAGNOSIS — J441 Chronic obstructive pulmonary disease with (acute) exacerbation: Secondary | ICD-10-CM | POA: Diagnosis not present

## 2021-10-28 DIAGNOSIS — J9601 Acute respiratory failure with hypoxia: Secondary | ICD-10-CM | POA: Diagnosis not present

## 2021-10-28 DIAGNOSIS — Z6836 Body mass index (BMI) 36.0-36.9, adult: Secondary | ICD-10-CM | POA: Insufficient documentation

## 2021-10-28 DIAGNOSIS — R1907 Generalized intra-abdominal and pelvic swelling, mass and lump: Secondary | ICD-10-CM | POA: Diagnosis not present

## 2021-10-28 DIAGNOSIS — R601 Generalized edema: Secondary | ICD-10-CM | POA: Diagnosis present

## 2021-10-28 DIAGNOSIS — E724 Disorders of ornithine metabolism: Secondary | ICD-10-CM | POA: Insufficient documentation

## 2021-10-28 DIAGNOSIS — Z96642 Presence of left artificial hip joint: Secondary | ICD-10-CM | POA: Insufficient documentation

## 2021-10-28 DIAGNOSIS — E7220 Disorder of urea cycle metabolism, unspecified: Secondary | ICD-10-CM | POA: Diagnosis present

## 2021-10-28 DIAGNOSIS — J9602 Acute respiratory failure with hypercapnia: Secondary | ICD-10-CM | POA: Diagnosis not present

## 2021-10-28 DIAGNOSIS — R0602 Shortness of breath: Secondary | ICD-10-CM | POA: Diagnosis present

## 2021-10-28 LAB — BLOOD GAS, VENOUS
Acid-Base Excess: 7.6 mmol/L — ABNORMAL HIGH (ref 0.0–2.0)
Bicarbonate: 36.1 mmol/L — ABNORMAL HIGH (ref 20.0–28.0)
O2 Saturation: 99.3 %
Patient temperature: 98.6
pCO2, Ven: 68.9 mmHg — ABNORMAL HIGH (ref 44.0–60.0)
pH, Ven: 7.339 (ref 7.250–7.430)
pO2, Ven: 156 mmHg — ABNORMAL HIGH (ref 32.0–45.0)

## 2021-10-28 LAB — RESP PANEL BY RT-PCR (FLU A&B, COVID) ARPGX2
Influenza A by PCR: NEGATIVE
Influenza B by PCR: NEGATIVE
SARS Coronavirus 2 by RT PCR: NEGATIVE

## 2021-10-28 LAB — COMPREHENSIVE METABOLIC PANEL
ALT: 14 U/L (ref 0–44)
AST: 18 U/L (ref 15–41)
Albumin: 3.2 g/dL — ABNORMAL LOW (ref 3.5–5.0)
Alkaline Phosphatase: 65 U/L (ref 38–126)
Anion gap: 5 (ref 5–15)
BUN: 10 mg/dL (ref 6–20)
CO2: 37 mmol/L — ABNORMAL HIGH (ref 22–32)
Calcium: 8.3 mg/dL — ABNORMAL LOW (ref 8.9–10.3)
Chloride: 93 mmol/L — ABNORMAL LOW (ref 98–111)
Creatinine, Ser: 0.63 mg/dL (ref 0.61–1.24)
GFR, Estimated: >60 mL/min (ref >60)
Glucose, Bld: 117 mg/dL — ABNORMAL HIGH (ref 70–99)
Potassium: 3.6 mmol/L (ref 3.5–5.1)
Sodium: 135 mmol/L (ref 135–145)
Total Bilirubin: 0.5 mg/dL (ref 0.3–1.2)
Total Protein: 6.3 g/dL — ABNORMAL LOW (ref 6.5–8.1)

## 2021-10-28 LAB — PROTIME-INR
INR: 1.1 (ref 0.8–1.2)
Prothrombin Time: 14.1 seconds (ref 11.4–15.2)

## 2021-10-28 LAB — HEPATITIS PANEL, ACUTE
HCV Ab: NONREACTIVE
Hep A IgM: NONREACTIVE
Hep B C IgM: NONREACTIVE
Hepatitis B Surface Ag: NONREACTIVE

## 2021-10-28 LAB — CBC
HCT: 56.6 % — ABNORMAL HIGH (ref 39.0–52.0)
Hemoglobin: 16.5 g/dL (ref 13.0–17.0)
MCH: 28.8 pg (ref 26.0–34.0)
MCHC: 29.2 g/dL — ABNORMAL LOW (ref 30.0–36.0)
MCV: 99 fL (ref 80.0–100.0)
Platelets: 198 10*3/uL (ref 150–400)
RBC: 5.72 MIL/uL (ref 4.22–5.81)
RDW: 14.6 % (ref 11.5–15.5)
WBC: 5.7 10*3/uL (ref 4.0–10.5)
nRBC: 0.4 % — ABNORMAL HIGH (ref 0.0–0.2)

## 2021-10-28 LAB — AMMONIA: Ammonia: 57 umol/L — ABNORMAL HIGH (ref 9–35)

## 2021-10-28 LAB — BRAIN NATRIURETIC PEPTIDE: B Natriuretic Peptide: 176.7 pg/mL — ABNORMAL HIGH (ref 0.0–100.0)

## 2021-10-28 LAB — PHOSPHORUS: Phosphorus: 4 mg/dL (ref 2.5–4.6)

## 2021-10-28 LAB — MAGNESIUM: Magnesium: 1.9 mg/dL (ref 1.7–2.4)

## 2021-10-28 MED ORDER — LACTULOSE 10 GM/15ML PO SOLN
20.0000 g | Freq: Two times a day (BID) | ORAL | Status: DC
  Administered 2021-10-28: 20 g via ORAL
  Filled 2021-10-28: qty 30

## 2021-10-28 MED ORDER — LACTULOSE 10 GM/15ML PO SOLN: 20.0000 g | Freq: Once | ORAL | Status: DC

## 2021-10-28 MED ORDER — FUROSEMIDE 10 MG/ML IJ SOLN
20.0000 mg | Freq: Two times a day (BID) | INTRAMUSCULAR | Status: DC
  Administered 2021-10-28: 20 mg via INTRAVENOUS
  Filled 2021-10-28: qty 2

## 2021-10-28 MED ORDER — ONDANSETRON HCL 4 MG PO TABS
4.0000 mg | ORAL_TABLET | Freq: Four times a day (QID) | ORAL | Status: DC | PRN
Start: 2021-10-28 — End: 2021-10-29

## 2021-10-28 MED ORDER — IOHEXOL 9 MG/ML PO SOLN
500.0000 mL | ORAL | Status: AC
  Administered 2021-10-28 (×2): 500 mL via ORAL

## 2021-10-28 MED ORDER — IOHEXOL 9 MG/ML PO SOLN
ORAL | Status: AC
  Filled 2021-10-28: qty 1000

## 2021-10-28 MED ORDER — FUROSEMIDE 10 MG/ML IJ SOLN: 40.0000 mg | Freq: Two times a day (BID) | INTRAMUSCULAR | Status: DC

## 2021-10-28 MED ORDER — FUROSEMIDE 10 MG/ML IJ SOLN
20.0000 mg | Freq: Once | INTRAMUSCULAR | Status: AC
  Administered 2021-10-28: 20 mg via INTRAVENOUS
  Filled 2021-10-28: qty 4

## 2021-10-28 MED ORDER — IPRATROPIUM-ALBUTEROL 0.5-2.5 (3) MG/3ML IN SOLN
3.0000 mL | Freq: Once | RESPIRATORY_TRACT | Status: AC
  Administered 2021-10-28: 3 mL via RESPIRATORY_TRACT
  Filled 2021-10-28: qty 3

## 2021-10-28 MED ORDER — METHYLPREDNISOLONE SODIUM SUCC 40 MG IJ SOLR
40.0000 mg | Freq: Two times a day (BID) | INTRAMUSCULAR | Status: AC
  Administered 2021-10-28: 40 mg via INTRAVENOUS
  Filled 2021-10-28: qty 1

## 2021-10-28 MED ORDER — METHYLPREDNISOLONE SODIUM SUCC 125 MG IJ SOLR
125.0000 mg | INTRAMUSCULAR | Status: AC
  Administered 2021-10-28: 125 mg via INTRAVENOUS
  Filled 2021-10-28: qty 2

## 2021-10-28 MED ORDER — LACTULOSE 10 GM/15ML PO SOLN: 30.0000 g | Freq: Once | ORAL | Status: DC

## 2021-10-28 MED ORDER — IPRATROPIUM-ALBUTEROL 0.5-2.5 (3) MG/3ML IN SOLN: 3.0000 mL | RESPIRATORY_TRACT | Status: DC | PRN

## 2021-10-28 MED ORDER — ONDANSETRON HCL 4 MG/2ML IJ SOLN: 4.0000 mg | Freq: Four times a day (QID) | INTRAMUSCULAR | Status: DC | PRN

## 2021-10-28 MED ORDER — LISINOPRIL 5 MG PO TABS: 5.0000 mg | ORAL_TABLET | Freq: Every day | ORAL | Status: DC

## 2021-10-28 MED ORDER — IPRATROPIUM-ALBUTEROL 0.5-2.5 (3) MG/3ML IN SOLN
3.0000 mL | Freq: Once | RESPIRATORY_TRACT | Status: AC
Start: 2021-10-28 — End: 2021-10-28
  Administered 2021-10-28: 3 mL via RESPIRATORY_TRACT
  Filled 2021-10-28: qty 3

## 2021-10-28 MED ORDER — SODIUM CHLORIDE (PF) 0.9 % IJ SOLN
INTRAMUSCULAR | Status: AC
  Filled 2021-10-28: qty 50

## 2021-10-28 MED ORDER — BUPRENORPHINE HCL-NALOXONE HCL 2-0.5 MG SL SUBL: 2.0000 | SUBLINGUAL_TABLET | Freq: Every day | SUBLINGUAL | Status: DC

## 2021-10-28 MED ORDER — PREDNISONE 20 MG PO TABS: 40.0000 mg | ORAL_TABLET | Freq: Every day | ORAL | Status: DC

## 2021-10-28 MED ORDER — NICOTINE 21 MG/24HR TD PT24: 21.0000 mg | MEDICATED_PATCH | Freq: Every day | TRANSDERMAL | Status: DC | PRN

## 2021-10-28 MED ORDER — IOHEXOL 300 MG/ML  SOLN
100.0000 mL | Freq: Once | INTRAMUSCULAR | Status: AC | PRN
  Administered 2021-10-28: 100 mL via INTRAVENOUS

## 2021-10-28 NOTE — ED Triage Notes (Signed)
Pt BIBA from home for c/o sweling in legs, feet, belly x2 weeks. Thought 'it was from dehydration'. Now endorses [facility] with exertion. Family reports increased fatigue and drowsiness last night and this morning, difficulty getting up d/t weakness. Rales RLL. Hx COPD. Has not had his suboxone this morning.  BP 130/70 HR 106  SpO2 80% , 92% 3L [place], does not wear at home Temp 98.1

## 2021-10-28 NOTE — Progress Notes (Signed)
Patient refused bipap.

## 2021-10-28 NOTE — ED Notes (Signed)
Pt reports intermitting twitching in arms, mainly left, over the last few weeks. Went to dr a few days ago with no answers.

## 2021-10-28 NOTE — Progress Notes (Signed)
RT NOTE:  RT attempted to place pt on CPAP for him to take a nap and pt was unable to sustain his O2 saturations while on the CPAP, switched over to the BiPAP machine with 50% FiO2 with no complications noted and oxygen saturations increased to 96%. RN aware and at bedside.
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
## 2021-10-28 NOTE — ED Notes (Signed)
Respiratory at bedside.

## 2021-10-28 NOTE — ED Provider Notes (Signed)
[facility]-EMERGENCY DEPT Provider Note   CSN: [id number] Arrival date & time: 10/28/21  1103     History  Chief Complaint  Patient presents with   Shortness of Breath   Leg Swelling    Trevor Thomas is a 56 y.o. male with a medical history significant for hypertension, COPD, history of alcohol use.  Patient presents to the ED for evaluation of shortness of breath via EMS.  Patient states that he has become increasingly short of breath for "the last couple weeks, 2 weeks".  Patient states that he has had bilateral ankle and feet swelling as well as flapping of his left wrist.  Patient states that the shortness of breath is constant and has gotten worse over the last 2 weeks.  Patient states that this feels similar to COPD exacerbation has had in the past.  Patient also complaining of excessive drowsiness, he is unsure of the reason for this.  Patient endorses bilateral ankle and feet swelling, shortness of breath.  Patient denies chest pain, nausea, vomiting, fever, cough with sputum.   Shortness of Breath Associated symptoms: no abdominal pain, no chest pain, no fever and no vomiting       Home Medications Prior to Admission medications   Medication Sig Start Date End Date Taking? Authorizing Provider  acetaminophen (TYLENOL) 325 MG tablet Take 650 mg by mouth every 6 (six) hours as needed for moderate pain.    [provider]  albuterol (PROVENTIL HFA;VENTOLIN HFA) 108 (90 Base) MCG/ACT inhaler Inhale 2 puffs into the lungs every 6 (six) hours as needed for wheezing. 01/30/17   Renne Musca, MD  amLODipine-benazepril (LOTREL) 10-40 MG capsule Take 1 capsule by mouth daily. 06/03/20   [provider]  aspirin EC 325 MG tablet Take 1 tablet (325 mg total) by mouth 2 (two) times daily. Patient not taking: Reported on 08/28/2020 10/31/18   Teryl Lucy, MD  loratadine (CLARITIN) 10 MG tablet Take 10 mg by mouth daily.    [provider]   sennosides-docusate sodium (SENOKOT-S) 8.6-50 MG tablet Take 2 tablets by mouth daily. Patient not taking: Reported on 08/28/2020 10/31/18   Teryl Lucy, MD  SUBOXONE 4-1 MG FILM Place 4 Film under the tongue daily.  06/03/20   [provider]  tiZANidine (ZANAFLEX) 4 MG tablet Take 4 mg by mouth 3 (three) times daily. 08/13/20   [provider]      Allergies    Bee venom and Clonidine derivatives    Review of Systems   Review of Systems  Constitutional:  Negative for chills and fever.  Respiratory:  Positive for shortness of breath.   Cardiovascular:  Positive for leg swelling. Negative for chest pain.  Gastrointestinal:  Negative for abdominal pain, diarrhea, nausea and vomiting.  All other systems reviewed and are negative.  Physical Exam Updated Vital Signs BP 124/76    Pulse (!) 101    Temp 98.3 F (36.8 C) (Oral)    Resp 19    Ht 5\' 11"  (1.803 m)    Wt 120.2 kg    SpO2 93%    BMI 36.96 kg/m  Physical Exam Vitals and nursing note reviewed.  Constitutional:      General: He is not in acute distress.    Appearance: He is not ill-appearing or toxic-appearing.  HENT:     Head: Normocephalic and atraumatic.     Nose: Nose normal.     Mouth/Throat:     Mouth: Mucous membranes  are moist.  Eyes:     Extraocular Movements: Extraocular movements intact.     Pupils: Pupils are equal, round, and reactive to light.  Neck:     Vascular: No JVD.  Cardiovascular:     Rate and Rhythm: Normal rate and regular rhythm.  Pulmonary:     Effort: Pulmonary effort is normal.     Breath sounds: Examination of the right-middle field reveals wheezing. Examination of the left-middle field reveals wheezing. Examination of the left-lower field reveals wheezing. Wheezing present.  Abdominal:     General: Bowel sounds are normal. There is distension.     Tenderness: There is no abdominal tenderness. There is no guarding.     Comments: Abdomen is markedly distended throughout.   Appearance of caput medusa near umbilicus.  Musculoskeletal:     Cervical back: Normal range of motion and neck supple.  Skin:    General: Skin is warm and dry.     Capillary Refill: Capillary refill takes less than 2 seconds.  Neurological:     Mental Status: He is alert and oriented to person, place, and time.    ED Results / Procedures / Treatments   Labs (all labs ordered are listed, but only abnormal results are displayed) Labs Reviewed  BRAIN NATRIURETIC PEPTIDE - Abnormal; Notable for the following components:      Result Value   B Natriuretic Peptide 176.7 (*)    All other components within normal limits  BLOOD GAS, VENOUS - Abnormal; Notable for the following components:   pCO2, Ven 68.9 (*)    pO2, Ven 156.0 (*)    Bicarbonate 36.1 (*)    Acid-Base Excess 7.6 (*)    All other components within normal limits  COMPREHENSIVE METABOLIC PANEL - Abnormal; Notable for the following components:   Chloride 93 (*)    CO2 37 (*)    Glucose, Bld 117 (*)    Calcium 8.3 (*)    Total Protein 6.3 (*)    Albumin 3.2 (*)    All other components within normal limits  AMMONIA - Abnormal; Notable for the following components:   Ammonia 57 (*)    All other components within normal limits  RESP PANEL BY RT-PCR (FLU A&B, COVID) [id number]    EKG None  Radiology DG Chest 2 View  Result Date: 10/28/2021 CLINICAL DATA:  Shortness of breath and lower extremity swelling for 2 weeks. EXAM: CHEST - 2 VIEW COMPARISON:  10/19/2021 and 06/29/2021 FINDINGS: Stable borderline cardiomegaly given portable technique. No evidence of pulmonary infiltrate or edema. No evidence of pleural effusion. IMPRESSION: No active disease. Electronically Signed   By: Danae Orleans M.D.   On: 10/28/2021 10:30    Procedures Procedures    Medications Ordered in ED Medications  lactulose (CHRONULAC) 10 GM/15ML solution 30 g (has no administration in time range)  furosemide (LASIX) injection 20 mg (20 mg Intravenous  Given 10/28/21 1028)  ipratropium-albuterol (DUONEB) 0.5-2.5 (3) MG/3ML nebulizer solution 3 mL (3 mLs Nebulization Given 10/28/21 1035)  methylPREDNISolone sodium succinate (SOLU-MEDROL) 125 mg/2 mL injection 125 mg (125 mg Intravenous Given 10/28/21 1028)  ipratropium-albuterol (DUONEB) 0.5-2.5 (3) MG/3ML nebulizer solution 3 mL (3 mLs Nebulization Given 10/28/21 1147)    ED Course/ Medical Decision Making/ A&P Clinical Course as of 10/28/21 1226  Thu Oct 28, 2021  1044 Ammonia(!): 57 [CG]    Clinical Course User Index [CG] Al Decant, PA-C  Medical Decision Making Amount and/or Complexity of Data Reviewed Labs: ordered. Decision-making details documented in ED Course. Radiology: ordered.  Risk Prescription drug management. Decision regarding hospitalization.   56 year old male presents due to shortness of breath along with bilateral lower extremity swelling and flapping of left wrist for "last couple weeks".  On examination, patient afebrile, hypoxic with oxygen saturation of 88% on room air, slightly tachycardic with pulse rate of 101.  The patient has 1+ pitting edema bilaterally to his lower extremities.  Patient does have appearance of asterixis on examination.  Patient also has wheezing to lower lung fields bilaterally, most likely related to COPD.  Patient complaining of slight orthopnea when assessed.  Patient was placed on 3 L of oxygen via nasal cannula due to his decreased oxygen saturation, hypoxia.  Patient will be worked up utilizing the following labs which I have personally ordered and interpreted: BNP, ammonia, CMP, VBG, chest x-ray, EKG  BNP elevated at 176 Ammonia elevated 57 CMP shows elevated CO2 to 37, slightly decreased protein 6.3, slightly decreased albumin at 3.2 Patient VBG shows bicarb of 36.1, PCO2 68.9, pH is 7.34 Patient chest x-ray shows stable cardiomegaly, no signs of pleural effusion or infiltrate.  Patient  treated with DuoNeb x2.  After initial DuoNeb treatment, patient still has pronounced wheezing.  Patient treated with second DuoNeb which resolved this wheezing.  Patient also treated with 125 mg Solu-Medrol. Patient given 10 g lactulose to decrease ammonia level.  I believe this is the cause of the patient's "wrist flapping". Patient given 20 mg Lasix to alleviate fluid overload. Patient noted to be retaining CO2, most likely related to undiagnosed sleep apnea.  The patient states that he has never had a sleep study conducted and does not wear CPAP at home.  I discussed these findings with the respiratory therapist, who believes that the best course of action for this patient would be CPAP use while in the hospital.  I agree with her opinion.  Due to the patient's new oxygen requirement, respiratory failure and hypoxia, I feel this patient meets inpatient admission requirements.  I have contacted the hospitalist, Dr. Robb Matar, who was agreed to admit the patient for hypoxia and respiratory failure.  I have explained this to the patient and he is agreed to come into the hospital for further treatment and evaluation.  The patient is stable at time of admission.   Final Clinical Impression(s) / ED Diagnoses Final diagnoses:  Acute respiratory failure with hypoxia and hypercapnia [facility])    Rx / DC Orders ED Discharge Orders     None         Clent Ridges 10/28/21 1226    Linwood Dibbles, MD 10/28/21 2012

## 2021-10-28 NOTE — ED Notes (Signed)
Pt asleep and SpO2 dropping into 80s. Respiratory called for CPAP

## 2021-10-28 NOTE — Progress Notes (Signed)
Pt became frustrated and said he was leaving AMA. Pt informed of the severity of his illness, verbalized understanding and said he still wants to leave. On call NP X. Blount notified. Pt removed his IV, signed the AMA papers, and left. Pt fiance tried to call pt to provide transportation but pt refused and said he would find a ride himself.

## 2021-10-29 NOTE — Discharge Summary (Signed)
Physician Discharge Summary  Trevor Thomas PVV:[id number] DOB: 20-Feb-1966 DOA: 10/28/2021  PCP: [facility]  Admit date: 10/28/2021 Discharge date: 10/29/2021  Admitted From: Home.  Disposition:  Left AMA  Recommendations for Outpatient Follow-up:  Follow up with PCP in 1-2 weeks Please obtain BMP/CBC in one week Please follow up on the following pending results:  Home Health:NO Equipment/Devices: None. Discharge Condition:Left AMA. CODE STATUS:Full code.  Diet recommendation: Heart Healthy / Carb Modified / Regular / Dysphagia   Brief/Interim Summary: 56 y.o. male with medical history significant of anxiety, panic attacks, chronic hip pain, COPD, depression, history of suicidal ideation, headache, history of remote alcohol abuse, history of GI bleed, history of traumatic brain injury, hypertension, left lower extremity weakness, cannabis use, narcotic dependence, osteoarthritis, class II obesity who is coming to the emergency department due to progressively worse edema of extremities and abdomen for the past 2 weeks associated with progressively worse dyspnea, wheezing and occasionally productive cough.  He endorses having "a cold" 2 weeks ago.  He has been weaker than usual and very somnolent.  He denied fever, chills, rhinorrhea, sore throat or hemoptysis.  No chest pain, palpitations, diaphoresis, PND, positive orthopnea.  Positive abdominal pain when coughing, but no nausea, emesis, diarrhea, constipation, melena or hematochezia.  No dysuria, frequency or hematuria.  No polyuria, polydipsia, polyphagia or blurred vision.  Discharge Diagnoses:  Principal Problem:   Acute respiratory failure with hypoxia and hypercapnia (HCC) Active Problems:   Essential hypertension   Tobacco abuse   Anasarca   COPD with acute exacerbation (HCC)   Hyperammonemia (HCC)   Class 2 obesity   Protein-calorie malnutrition, mild (HCC)   Discharge Instructions Left AMA.  Allergies as of  10/28/2021       Reactions   Bee Venom Anaphylaxis   Clonidine Derivatives Other (See Comments)   Stomach pain        Medication List     ASK your doctor about these medications    acetaminophen 325 MG tablet Commonly known as: TYLENOL Take 650 mg by mouth every 6 (six) hours as needed for moderate pain.   albuterol 108 (90 Base) MCG/ACT inhaler Commonly known as: VENTOLIN HFA Inhale 2 puffs into the lungs every 6 (six) hours as needed for wheezing.   amLODipine-benazepril 10-40 MG capsule Commonly known as: LOTREL Take 1 capsule by mouth daily.   loratadine 10 MG tablet Commonly known as: CLARITIN Take 10 mg by mouth daily.   sennosides-docusate sodium 8.6-50 MG tablet Commonly known as: SENOKOT-S Take 2 tablets by mouth daily.   Suboxone 4-1 MG Film Generic drug: Buprenorphine HCl-Naloxone HCl Place 4 Film under the tongue 4 (four) times daily.        Allergies  Allergen Reactions   Bee Venom Anaphylaxis   Clonidine Derivatives Other (See Comments)    Stomach pain   Consultations:  Procedures/Studies: CT ABDOMEN PELVIS W WO CONTRAST  Result Date: 10/28/2021 CLINICAL DATA:  Ascites EXAM: CT ABDOMEN AND PELVIS WITHOUT AND WITH CONTRAST TECHNIQUE: Multidetector CT imaging of the abdomen and pelvis was performed following the standard protocol before and following the bolus administration of intravenous contrast. RADIATION DOSE REDUCTION: This exam was performed according to the departmental dose-optimization program which includes automated exposure control, adjustment of the mA and/or kV according to patient size and/or use of iterative reconstruction technique. CONTRAST:  OMNIPAQUE IOHEXOL 300 MG/ML  SOLN COMPARISON:  CT abdomen and pelvis 10/20/2014 FINDINGS: Lower chest: Cardiomegaly and small pericardial effusion. Irregular densities in  the bilateral lung bases/lower lobes, right-greater-than-left. Hepatobiliary: Liver is normal in size with normal  contour. Hypodense parenchyma suggesting hepatic steatosis. Chronic faint hypodensities in the right hepatic lobe centrally and near the falciform ligament, likely represent focal fatty infiltration. Gallbladder wall thickening and mild surrounding fat stranding. No biliary ductal dilatation identified. Pancreas: Mildly atrophic with no suspicious mass or ductal dilatation identified. Spleen: Normal in size without focal abnormality. Adrenals/Urinary Tract: Adrenal glands appear normal. Kidneys appear normal. Urinary bladder is significantly distended with no suspicious mass or wall thickening identified. Stomach/Bowel: No bowel obstruction, free air or pneumatosis. No bowel wall edema identified. Appendix is normal. Vascular/Lymphatic: Aortic atherosclerosis. No enlarged abdominal or pelvic lymph nodes. Reproductive: Prostate is unremarkable. Other: No ascites. Presacral fat stranding edema. Diffuse subcutaneous soft tissue edema. Musculoskeletal: No suspicious bony lesions identified. IMPRESSION: 1. No ascites visualized. 2. Gallbladder wall thickening and mild surrounding fat stranding. Correlate clinically and consider follow-up ultrasound if indicated. 3. Cardiomegaly and small pericardial effusion. 4. Irregular densities in the bilateral lung bases right greater than left which likely represent atelectatic changes, or possibly infiltrate. 5. Diffuse subcutaneous soft tissue edema suggesting anasarca. 6. Significantly distended gallbladder without wall thickening, correlate clinically. Electronically Signed   By: Ofilia Neas M.D.   On: 10/28/2021 15:56   DG Chest 2 View  Result Date: 10/28/2021 CLINICAL DATA:  Shortness of breath and lower extremity swelling for 2 weeks. EXAM: CHEST - 2 VIEW COMPARISON:  10/19/2021 and 06/29/2021 FINDINGS: Stable borderline cardiomegaly given portable technique. No evidence of pulmonary infiltrate or edema. No evidence of pleural effusion. IMPRESSION: No active  disease. Electronically Signed   By: Marlaine Hind M.D.   On: 10/28/2021 10:30   DG Chest Port 1 View  Result Date: 10/19/2021 CLINICAL DATA:  Cough with shortness of breath. EXAM: PORTABLE CHEST 1 VIEW COMPARISON:  Chest x-ray 927 1,022. FINDINGS: The heart is enlarged. There is patchy airspace disease in the bilateral lower lungs, left greater than right. Costophrenic angles are clear. No pneumothorax. No acute fractures. IMPRESSION: 1. Bilateral lower lung airspace disease, left greater than right, worrisome for infection. 2. Stable cardiomegaly. Electronically Signed   By: Ronney Asters M.D.   On: 10/19/2021 19:52    Subjective:  Discharge Exam: Vitals:   10/28/21 1749 10/28/21 2135  BP: (!) 141/92 126/81  Pulse: 95 99  Resp: 19   Temp: 98.6 F (37 C) 98.8 F (37.1 C)  SpO2: 93%    Left AMA and did not get examined.  The results of significant diagnostics from this hospitalization (including imaging, microbiology, ancillary and laboratory) are listed below for reference.     Microbiology: Recent Results (from the past 240 hour(s))  Resp Panel by RT-PCR (Flu A&B, Covid) Nasopharyngeal Swab     Status: None   Collection Time: 10/19/21  7:23 PM   Specimen: Nasopharyngeal Swab; Nasopharyngeal(NP) swabs in vial transport medium  Result Value Ref Range Status   SARS Coronavirus 2 by RT PCR NEGATIVE NEGATIVE Final    Comment: (NOTE) SARS-CoV-2 target nucleic acids are NOT DETECTED.  The SARS-CoV-2 RNA is generally detectable in upper respiratory specimens during the acute phase of infection. The lowest concentration of SARS-CoV-2 viral copies this assay can detect is 138 copies/mL. A negative result does not preclude SARS-Cov-2 infection and should not be used as the sole basis for treatment or other patient management decisions. A negative result may occur with  improper specimen collection/handling, submission of specimen other than nasopharyngeal swab, presence of  viral  mutation(s) within the areas targeted by this assay, and inadequate number of viral copies(<138 copies/mL). A negative result must be combined with clinical observations, patient history, and epidemiological information. The expected result is Negative.  Fact Sheet for Patients:  EntrepreneurPulse.com.au  Fact Sheet for Healthcare Providers:  IncredibleEmployment.be  This test is no t yet approved or cleared by the [place] FDA and  has been authorized for detection and/or diagnosis of SARS-CoV-2 by FDA under an Emergency Use Authorization (EUA). This EUA will remain  in effect (meaning this test can be used) for the duration of the COVID-19 declaration under Section 564(b)(1) of the Act, 21 U.S.C.section 360bbb-3(b)(1), unless the authorization is terminated  or revoked sooner.       Influenza A by PCR NEGATIVE NEGATIVE Final   Influenza B by PCR NEGATIVE NEGATIVE Final    Comment: (NOTE) The Xpert Xpress SARS-CoV-2/FLU/RSV plus assay is intended as an aid in the diagnosis of influenza from Nasopharyngeal swab specimens and should not be used as a sole basis for treatment. Nasal washings and aspirates are unacceptable for Xpert Xpress SARS-CoV-2/FLU/RSV testing.  Fact Sheet for Patients: EntrepreneurPulse.com.au  Fact Sheet for Healthcare Providers: IncredibleEmployment.be  This test is not yet approved or cleared by the [place] FDA and has been authorized for detection and/or diagnosis of SARS-CoV-2 by FDA under an Emergency Use Authorization (EUA). This EUA will remain in effect (meaning this test can be used) for the duration of the COVID-19 declaration under Section 564(b)(1) of the Act, 21 U.S.C. section 360bbb-3(b)(1), unless the authorization is terminated or revoked.  Performed at [facility], [place] [address] [place], [place] [address]   Resp Panel by RT-PCR (Flu A&B,  Covid) Nasopharyngeal Swab     Status: None   Collection Time: 10/28/21 12:08 PM   Specimen: Nasopharyngeal Swab; Nasopharyngeal(NP) swabs in vial transport medium  Result Value Ref Range Status   SARS Coronavirus 2 by RT PCR NEGATIVE NEGATIVE Final    Comment: (NOTE) SARS-CoV-2 target nucleic acids are NOT DETECTED.  The SARS-CoV-2 RNA is generally detectable in upper respiratory specimens during the acute phase of infection. The lowest concentration of SARS-CoV-2 viral copies this assay can detect is 138 copies/mL. A negative result does not preclude SARS-Cov-2 infection and should not be used as the sole basis for treatment or other patient management decisions. A negative result may occur with  improper specimen collection/handling, submission of specimen other than nasopharyngeal swab, presence of viral mutation(s) within the areas targeted by this assay, and inadequate number of viral copies(<138 copies/mL). A negative result must be combined with clinical observations, patient history, and epidemiological information. The expected result is Negative.  Fact Sheet for Patients:  EntrepreneurPulse.com.au  Fact Sheet for Healthcare Providers:  IncredibleEmployment.be  This test is no t yet approved or cleared by the [place] FDA and  has been authorized for detection and/or diagnosis of SARS-CoV-2 by FDA under an Emergency Use Authorization (EUA). This EUA will remain  in effect (meaning this test can be used) for the duration of the COVID-19 declaration under Section 564(b)(1) of the Act, 21 U.S.C.section 360bbb-3(b)(1), unless the authorization is terminated  or revoked sooner.       Influenza A by PCR NEGATIVE NEGATIVE Final   Influenza B by PCR NEGATIVE NEGATIVE Final    Comment: (NOTE) The Xpert Xpress SARS-CoV-2/FLU/RSV plus assay is intended as an aid in the diagnosis of influenza from Nasopharyngeal swab specimens and should  not be  used as a sole basis for treatment. Nasal washings and aspirates are unacceptable for Xpert Xpress SARS-CoV-2/FLU/RSV testing.  Fact Sheet for Patients: EntrepreneurPulse.com.au  Fact Sheet for Healthcare Providers: IncredibleEmployment.be  This test is not yet approved or cleared by the [place] FDA and has been authorized for detection and/or diagnosis of SARS-CoV-2 by FDA under an Emergency Use Authorization (EUA). This EUA will remain in effect (meaning this test can be used) for the duration of the COVID-19 declaration under Section 564(b)(1) of the Act, 21 U.S.C. section 360bbb-3(b)(1), unless the authorization is terminated or revoked.  Performed at [facility], [place] [address], [place], [place] [address]      Labs: BNP (last 3 results) Recent Labs    10/28/21 0944  BNP [id number]*   Basic Metabolic Panel: Recent Labs  Lab 10/28/21 0944  NA 135  K 3.6  CL 93*  CO2 37*  GLUCOSE 117*  BUN 10  CREATININE 0.63  CALCIUM 8.3*  MG 1.9  PHOS 4.0   Liver Function Tests: Recent Labs  Lab 10/28/21 0944  AST 18  ALT 14  ALKPHOS 65  BILITOT 0.5  PROT 6.3*  ALBUMIN 3.2*   No results for input(s): LIPASE, AMYLASE in the last 168 hours. Recent Labs  Lab 10/28/21 0944  AMMONIA 57*   CBC: Recent Labs  Lab 10/28/21 1845  WBC 5.7  HGB 16.5  HCT 56.6*  MCV 99.0  PLT 198   Cardiac Enzymes: No results for input(s): CKTOTAL, CKMB, CKMBINDEX, TROPONINI in the last 168 hours. BNP: Invalid input(s): POCBNP CBG: No results for input(s): GLUCAP in the last 168 hours. D-Dimer No results for input(s): DDIMER in the last 72 hours. Hgb A1c No results for input(s): HGBA1C in the last 72 hours. Lipid Profile No results for input(s): CHOL, HDL, LDLCALC, TRIG, CHOLHDL, LDLDIRECT in the last 72 hours. Thyroid function studies No results for input(s): TSH, T4TOTAL, T3FREE, THYROIDAB in the last 72  hours.  Invalid input(s): FREET3 Anemia work up No results for input(s): VITAMINB12, FOLATE, FERRITIN, TIBC, IRON, RETICCTPCT in the last 72 hours. Urinalysis    Component Value Date/Time   COLORURINE YELLOW 10/20/2014 2104   APPEARANCEUR CLEAR 10/20/2014 2104   LABSPEC 1.034 (H) 10/20/2014 2104   PHURINE 6.5 10/20/2014 2104   GLUCOSEU NEGATIVE 10/20/2014 2104   HGBUR NEGATIVE 10/20/2014 2104   BILIRUBINUR neg 03/14/2017 1351   KETONESUR NEGATIVE 10/20/2014 2104   PROTEINUR trace 03/14/2017 1351   PROTEINUR NEGATIVE 10/20/2014 2104   UROBILINOGEN 0.2 03/14/2017 1351   UROBILINOGEN 0.2 10/20/2014 2104   NITRITE neg 03/14/2017 1351   NITRITE NEGATIVE 10/20/2014 2104   LEUKOCYTESUR Negative 03/14/2017 1351   Sepsis Labs Invalid input(s): PROCALCITONIN,  WBC,  LACTICIDVEN Microbiology Recent Results (from the past 240 hour(s))  Resp Panel by RT-PCR (Flu A&B, Covid) Nasopharyngeal Swab     Status: None   Collection Time: 10/19/21  7:23 PM   Specimen: Nasopharyngeal Swab; Nasopharyngeal(NP) swabs in vial transport medium  Result Value Ref Range Status   SARS Coronavirus 2 by RT PCR NEGATIVE NEGATIVE Final    Comment: (NOTE) SARS-CoV-2 target nucleic acids are NOT DETECTED.  The SARS-CoV-2 RNA is generally detectable in upper respiratory specimens during the acute phase of infection. The lowest concentration of SARS-CoV-2 viral copies this assay can detect is 138 copies/mL. A negative result does not preclude SARS-Cov-2 infection and should not be used as the sole basis for treatment or other patient management decisions. A negative result may  occur with  improper specimen collection/handling, submission of specimen other than nasopharyngeal swab, presence of viral mutation(s) within the areas targeted by this assay, and inadequate number of viral copies(<138 copies/mL). A negative result must be combined with clinical observations, patient history, and  epidemiological information. The expected result is Negative.  Fact Sheet for Patients:  EntrepreneurPulse.com.au  Fact Sheet for Healthcare Providers:  IncredibleEmployment.be  This test is no t yet approved or cleared by the [place] FDA and  has been authorized for detection and/or diagnosis of SARS-CoV-2 by FDA under an Emergency Use Authorization (EUA). This EUA will remain  in effect (meaning this test can be used) for the duration of the COVID-19 declaration under Section 564(b)(1) of the Act, 21 U.S.C.section 360bbb-3(b)(1), unless the authorization is terminated  or revoked sooner.       Influenza A by PCR NEGATIVE NEGATIVE Final   Influenza B by PCR NEGATIVE NEGATIVE Final    Comment: (NOTE) The Xpert Xpress SARS-CoV-2/FLU/RSV plus assay is intended as an aid in the diagnosis of influenza from Nasopharyngeal swab specimens and should not be used as a sole basis for treatment. Nasal washings and aspirates are unacceptable for Xpert Xpress SARS-CoV-2/FLU/RSV testing.  Fact Sheet for Patients: EntrepreneurPulse.com.au  Fact Sheet for Healthcare Providers: IncredibleEmployment.be  This test is not yet approved or cleared by the [place] FDA and has been authorized for detection and/or diagnosis of SARS-CoV-2 by FDA under an Emergency Use Authorization (EUA). This EUA will remain in effect (meaning this test can be used) for the duration of the COVID-19 declaration under Section 564(b)(1) of the Act, 21 U.S.C. section 360bbb-3(b)(1), unless the authorization is terminated or revoked.  Performed at [facility], [place] [address], [place], [place] [address]   Resp Panel by RT-PCR (Flu A&B, Covid) Nasopharyngeal Swab     Status: None   Collection Time: 10/28/21 12:08 PM   Specimen: Nasopharyngeal Swab; Nasopharyngeal(NP) swabs in vial transport medium  Result Value Ref Range Status    SARS Coronavirus 2 by RT PCR NEGATIVE NEGATIVE Final    Comment: (NOTE) SARS-CoV-2 target nucleic acids are NOT DETECTED.  The SARS-CoV-2 RNA is generally detectable in upper respiratory specimens during the acute phase of infection. The lowest concentration of SARS-CoV-2 viral copies this assay can detect is 138 copies/mL. A negative result does not preclude SARS-Cov-2 infection and should not be used as the sole basis for treatment or other patient management decisions. A negative result may occur with  improper specimen collection/handling, submission of specimen other than nasopharyngeal swab, presence of viral mutation(s) within the areas targeted by this assay, and inadequate number of viral copies(<138 copies/mL). A negative result must be combined with clinical observations, patient history, and epidemiological information. The expected result is Negative.  Fact Sheet for Patients:  EntrepreneurPulse.com.au  Fact Sheet for Healthcare Providers:  IncredibleEmployment.be  This test is no t yet approved or cleared by the [place] FDA and  has been authorized for detection and/or diagnosis of SARS-CoV-2 by FDA under an Emergency Use Authorization (EUA). This EUA will remain  in effect (meaning this test can be used) for the duration of the COVID-19 declaration under Section 564(b)(1) of the Act, 21 U.S.C.section 360bbb-3(b)(1), unless the authorization is terminated  or revoked sooner.       Influenza A by PCR NEGATIVE NEGATIVE Final   Influenza B by PCR NEGATIVE NEGATIVE Final    Comment: (NOTE) The Xpert Xpress SARS-CoV-2/FLU/RSV plus assay is intended as an  aid in the diagnosis of influenza from Nasopharyngeal swab specimens and should not be used as a sole basis for treatment. Nasal washings and aspirates are unacceptable for Xpert Xpress SARS-CoV-2/FLU/RSV testing.  Fact Sheet for  Patients: EntrepreneurPulse.com.au  Fact Sheet for Healthcare Providers: IncredibleEmployment.be  This test is not yet approved or cleared by the [place] FDA and has been authorized for detection and/or diagnosis of SARS-CoV-2 by FDA under an Emergency Use Authorization (EUA). This EUA will remain in effect (meaning this test can be used) for the duration of the COVID-19 declaration under Section 564(b)(1) of the Act, 21 U.S.C. section 360bbb-3(b)(1), unless the authorization is terminated or revoked.  Performed at [facility], [place] [address], [place], [place] [address]     Time coordinating discharge: Less than 30 minutes  SIGNED:  Reubin Milan, MD  [facility] 10/29/2021, 10:09 AM Pager   If 7PM-7AM, please contact night-coverage [email or web address] Password [email or web address]

## 2021-11-15 ENCOUNTER — Encounter (HOSPITAL_COMMUNITY): Payer: Self-pay

## 2021-11-15 ENCOUNTER — Emergency Department (HOSPITAL_COMMUNITY): Payer: Medicaid Other

## 2021-11-15 ENCOUNTER — Other Ambulatory Visit: Payer: Self-pay

## 2021-11-15 ENCOUNTER — Inpatient Hospital Stay (HOSPITAL_COMMUNITY)
Admission: EM | Admit: 2021-11-15 | Discharge: 2021-11-17 | DRG: 291 | Discharge status: Against Medical Advice | Payer: Medicaid Other | Attending: Internal Medicine | Admitting: Internal Medicine

## 2021-11-15 DIAGNOSIS — J441 Chronic obstructive pulmonary disease with (acute) exacerbation: Secondary | ICD-10-CM | POA: Diagnosis present

## 2021-11-15 DIAGNOSIS — R188 Other ascites: Secondary | ICD-10-CM | POA: Diagnosis present

## 2021-11-15 DIAGNOSIS — Z5329 Procedure and treatment not carried out because of patient's decision for other reasons: Secondary | ICD-10-CM | POA: Diagnosis present

## 2021-11-15 DIAGNOSIS — Z720 Tobacco use: Secondary | ICD-10-CM | POA: Diagnosis not present

## 2021-11-15 DIAGNOSIS — R0602 Shortness of breath: Secondary | ICD-10-CM | POA: Diagnosis present

## 2021-11-15 DIAGNOSIS — I4510 Unspecified right bundle-branch block: Secondary | ICD-10-CM | POA: Diagnosis present

## 2021-11-15 DIAGNOSIS — I2609 Other pulmonary embolism with acute cor pulmonale: Secondary | ICD-10-CM | POA: Diagnosis present

## 2021-11-15 DIAGNOSIS — J9601 Acute respiratory failure with hypoxia: Secondary | ICD-10-CM | POA: Diagnosis present

## 2021-11-15 DIAGNOSIS — F1011 Alcohol abuse, in remission: Secondary | ICD-10-CM | POA: Diagnosis present

## 2021-11-15 DIAGNOSIS — Z8249 Family history of ischemic heart disease and other diseases of the circulatory system: Secondary | ICD-10-CM

## 2021-11-15 DIAGNOSIS — F17210 Nicotine dependence, cigarettes, uncomplicated: Secondary | ICD-10-CM | POA: Diagnosis present

## 2021-11-15 DIAGNOSIS — E662 Morbid (severe) obesity with alveolar hypoventilation: Secondary | ICD-10-CM | POA: Diagnosis present

## 2021-11-15 DIAGNOSIS — Z20822 Contact with and (suspected) exposure to covid-19: Secondary | ICD-10-CM | POA: Diagnosis present

## 2021-11-15 DIAGNOSIS — R0902 Hypoxemia: Secondary | ICD-10-CM

## 2021-11-15 DIAGNOSIS — G8929 Other chronic pain: Secondary | ICD-10-CM | POA: Diagnosis present

## 2021-11-15 DIAGNOSIS — R601 Generalized edema: Secondary | ICD-10-CM

## 2021-11-15 DIAGNOSIS — I10 Essential (primary) hypertension: Secondary | ICD-10-CM | POA: Diagnosis not present

## 2021-11-15 DIAGNOSIS — E441 Mild protein-calorie malnutrition: Secondary | ICD-10-CM | POA: Diagnosis present

## 2021-11-15 DIAGNOSIS — Z888 Allergy status to other drugs, medicaments and biological substances status: Secondary | ICD-10-CM

## 2021-11-15 DIAGNOSIS — M79605 Pain in left leg: Secondary | ICD-10-CM | POA: Diagnosis present

## 2021-11-15 DIAGNOSIS — M5030 Other cervical disc degeneration, unspecified cervical region: Secondary | ICD-10-CM | POA: Diagnosis present

## 2021-11-15 DIAGNOSIS — Z96642 Presence of left artificial hip joint: Secondary | ICD-10-CM | POA: Diagnosis present

## 2021-11-15 DIAGNOSIS — M1990 Unspecified osteoarthritis, unspecified site: Secondary | ICD-10-CM | POA: Diagnosis present

## 2021-11-15 DIAGNOSIS — R609 Edema, unspecified: Secondary | ICD-10-CM

## 2021-11-15 DIAGNOSIS — I3139 Other pericardial effusion (noninflammatory): Secondary | ICD-10-CM | POA: Diagnosis present

## 2021-11-15 DIAGNOSIS — I509 Heart failure, unspecified: Secondary | ICD-10-CM

## 2021-11-15 DIAGNOSIS — J449 Chronic obstructive pulmonary disease, unspecified: Secondary | ICD-10-CM | POA: Diagnosis present

## 2021-11-15 DIAGNOSIS — I5033 Acute on chronic diastolic (congestive) heart failure: Secondary | ICD-10-CM | POA: Diagnosis present

## 2021-11-15 DIAGNOSIS — Z87820 Personal history of traumatic brain injury: Secondary | ICD-10-CM | POA: Diagnosis not present

## 2021-11-15 DIAGNOSIS — Z91030 Bee allergy status: Secondary | ICD-10-CM

## 2021-11-15 DIAGNOSIS — Z833 Family history of diabetes mellitus: Secondary | ICD-10-CM | POA: Diagnosis not present

## 2021-11-15 DIAGNOSIS — I110 Hypertensive heart disease with heart failure: Secondary | ICD-10-CM | POA: Diagnosis present

## 2021-11-15 DIAGNOSIS — J9602 Acute respiratory failure with hypercapnia: Secondary | ICD-10-CM | POA: Diagnosis present

## 2021-11-15 DIAGNOSIS — Z79899 Other long term (current) drug therapy: Secondary | ICD-10-CM | POA: Diagnosis not present

## 2021-11-15 DIAGNOSIS — Z6838 Body mass index (BMI) 38.0-38.9, adult: Secondary | ICD-10-CM | POA: Diagnosis not present

## 2021-11-15 DIAGNOSIS — I2729 Other secondary pulmonary hypertension: Secondary | ICD-10-CM | POA: Diagnosis not present

## 2021-11-15 LAB — BLOOD GAS, VENOUS
Acid-Base Excess: 14.9 mmol/L — ABNORMAL HIGH (ref 0.0–2.0)
Bicarbonate: 41.6 mmol/L — ABNORMAL HIGH (ref 20.0–28.0)
FIO2: 21
O2 Saturation: 89.1 %
Patient temperature: 37
pCO2, Ven: 82.8 mmHg (ref 44.0–60.0)
pH, Ven: 7.321 (ref 7.250–7.430)
pO2, Ven: 60.5 mmHg — ABNORMAL HIGH (ref 32.0–45.0)

## 2021-11-15 LAB — AMMONIA: Ammonia: 59 umol/L — ABNORMAL HIGH (ref 9–35)

## 2021-11-15 LAB — CBC WITH DIFFERENTIAL/PLATELET
Abs Immature Granulocytes: 0.02 10*3/uL (ref 0.00–0.07)
Basophils Absolute: 0 10*3/uL (ref 0.0–0.1)
Basophils Relative: 0 %
Eosinophils Absolute: 0 10*3/uL (ref 0.0–0.5)
Eosinophils Relative: 1 %
HCT: 55.1 % — ABNORMAL HIGH (ref 39.0–52.0)
Hemoglobin: 15.7 g/dL (ref 13.0–17.0)
Immature Granulocytes: 0 %
Lymphocytes Relative: 31 %
Lymphs Abs: 1.7 10*3/uL (ref 0.7–4.0)
MCH: 26.3 pg (ref 26.0–34.0)
MCHC: 28.5 g/dL — ABNORMAL LOW (ref 30.0–36.0)
MCV: 92.4 fL (ref 80.0–100.0)
Monocytes Absolute: 0.4 10*3/uL (ref 0.1–1.0)
Monocytes Relative: 8 %
Neutro Abs: 3.4 10*3/uL (ref 1.7–7.7)
Neutrophils Relative %: 60 %
Platelets: 205 10*3/uL (ref 150–400)
RBC: 5.96 MIL/uL — ABNORMAL HIGH (ref 4.22–5.81)
RDW: 16.6 % — ABNORMAL HIGH (ref 11.5–15.5)
WBC: 5.6 10*3/uL (ref 4.0–10.5)
nRBC: 0 % (ref 0.0–0.2)

## 2021-11-15 LAB — COMPREHENSIVE METABOLIC PANEL
ALT: 10 U/L (ref 0–44)
AST: 19 U/L (ref 15–41)
Albumin: 2.9 g/dL — ABNORMAL LOW (ref 3.5–5.0)
Alkaline Phosphatase: 64 U/L (ref 38–126)
Anion gap: 8 (ref 5–15)
BUN: 6 mg/dL (ref 6–20)
CO2: 38 mmol/L — ABNORMAL HIGH (ref 22–32)
Calcium: 8.8 mg/dL — ABNORMAL LOW (ref 8.9–10.3)
Chloride: 90 mmol/L — ABNORMAL LOW (ref 98–111)
Creatinine, Ser: 0.73 mg/dL (ref 0.61–1.24)
GFR, Estimated: >60 mL/min (ref >60)
Glucose, Bld: 109 mg/dL — ABNORMAL HIGH (ref 70–99)
Potassium: 3.8 mmol/L (ref 3.5–5.1)
Sodium: 136 mmol/L (ref 135–145)
Total Bilirubin: 0.5 mg/dL (ref 0.3–1.2)
Total Protein: 5.7 g/dL — ABNORMAL LOW (ref 6.5–8.1)

## 2021-11-15 LAB — I-STAT VENOUS BLOOD GAS, ED
Acid-Base Excess: 13 mmol/L — ABNORMAL HIGH (ref 0.0–2.0)
Bicarbonate: 42.1 mmol/L — ABNORMAL HIGH (ref 20.0–28.0)
Calcium, Ion: 0.96 mmol/L — ABNORMAL LOW (ref 1.15–1.40)
HCT: 54 % — ABNORMAL HIGH (ref 39.0–52.0)
Hemoglobin: 18.4 g/dL — ABNORMAL HIGH (ref 13.0–17.0)
O2 Saturation: 84 %
Potassium: 7.4 mmol/L (ref 3.5–5.1)
Sodium: 133 mmol/L — ABNORMAL LOW (ref 135–145)
TCO2: 44 mmol/L — ABNORMAL HIGH (ref 22–32)
pCO2, Ven: 65.6 mmHg — ABNORMAL HIGH (ref 44.0–60.0)
pH, Ven: 7.415 (ref 7.250–7.430)
pO2, Ven: 51 mmHg — ABNORMAL HIGH (ref 32.0–45.0)

## 2021-11-15 LAB — BASIC METABOLIC PANEL
Anion gap: 7 (ref 5–15)
BUN: 5 mg/dL — ABNORMAL LOW (ref 6–20)
CO2: 40 mmol/L — ABNORMAL HIGH (ref 22–32)
Calcium: 8.7 mg/dL — ABNORMAL LOW (ref 8.9–10.3)
Chloride: 90 mmol/L — ABNORMAL LOW (ref 98–111)
Creatinine, Ser: 0.7 mg/dL (ref 0.61–1.24)
GFR, Estimated: >60 mL/min (ref >60)
Glucose, Bld: 83 mg/dL (ref 70–99)
Potassium: 3.8 mmol/L (ref 3.5–5.1)
Sodium: 137 mmol/L (ref 135–145)

## 2021-11-15 LAB — PROTIME-INR
INR: 1.2 (ref 0.8–1.2)
Prothrombin Time: 15.1 seconds (ref 11.4–15.2)

## 2021-11-15 LAB — URINALYSIS, ROUTINE W REFLEX MICROSCOPIC
Bacteria, UA: NONE SEEN
Bilirubin Urine: NEGATIVE
Glucose, UA: NEGATIVE mg/dL
Hgb urine dipstick: NEGATIVE
Ketones, ur: NEGATIVE mg/dL
Leukocytes,Ua: NEGATIVE
Nitrite: NEGATIVE
Protein, ur: 30 mg/dL — AB
Specific Gravity, Urine: 1.011 (ref 1.005–1.030)
pH: 6 (ref 5.0–8.0)

## 2021-11-15 LAB — RESP PANEL BY RT-PCR (FLU A&B, COVID) ARPGX2
Influenza A by PCR: NEGATIVE
Influenza B by PCR: NEGATIVE
SARS Coronavirus 2 by RT PCR: NEGATIVE

## 2021-11-15 LAB — TROPONIN I (HIGH SENSITIVITY)
Troponin I (High Sensitivity): 14 ng/L (ref <18)
Troponin I (High Sensitivity): 14 ng/L (ref <18)

## 2021-11-15 LAB — LIPASE, BLOOD: Lipase: 21 U/L (ref 11–51)

## 2021-11-15 LAB — BRAIN NATRIURETIC PEPTIDE: B Natriuretic Peptide: 284.4 pg/mL — ABNORMAL HIGH (ref 0.0–100.0)

## 2021-11-15 LAB — C-REACTIVE PROTEIN: CRP: 0.5 mg/dL (ref <1.0)

## 2021-11-15 LAB — HIV ANTIBODY (ROUTINE TESTING W REFLEX): HIV Screen 4th Generation wRfx: NONREACTIVE

## 2021-11-15 MED ORDER — NICOTINE 21 MG/24HR TD PT24: 21.0000 mg | MEDICATED_PATCH | Freq: Every day | TRANSDERMAL | Status: DC | PRN

## 2021-11-15 MED ORDER — ENOXAPARIN SODIUM 40 MG/0.4ML IJ SOSY
40.0000 mg | PREFILLED_SYRINGE | INTRAMUSCULAR | Status: DC
  Administered 2021-11-15 – 2021-11-17 (×3): 40 mg via SUBCUTANEOUS
  Filled 2021-11-15 (×3): qty 0.4

## 2021-11-15 MED ORDER — BUPRENORPHINE HCL-NALOXONE HCL 2-0.5 MG SL SUBL
2.0000 | SUBLINGUAL_TABLET | Freq: Four times a day (QID) | SUBLINGUAL | Status: DC
  Administered 2021-11-15 – 2021-11-17 (×8): 2 via SUBLINGUAL
  Filled 2021-11-15 (×9): qty 2

## 2021-11-15 MED ORDER — BENAZEPRIL HCL 40 MG PO TABS
40.0000 mg | ORAL_TABLET | Freq: Every day | ORAL | Status: DC
  Administered 2021-11-15 – 2021-11-16 (×2): 40 mg via ORAL
  Filled 2021-11-15 (×3): qty 1

## 2021-11-15 MED ORDER — ACETAMINOPHEN 325 MG PO TABS: 650.0000 mg | ORAL_TABLET | Freq: Four times a day (QID) | ORAL | Status: DC | PRN

## 2021-11-15 MED ORDER — SODIUM CHLORIDE 0.9% FLUSH
3.0000 mL | Freq: Two times a day (BID) | INTRAVENOUS | Status: DC
  Administered 2021-11-15 – 2021-11-17 (×5): 3 mL via INTRAVENOUS

## 2021-11-15 MED ORDER — AMLODIPINE BESYLATE 10 MG PO TABS
10.0000 mg | ORAL_TABLET | Freq: Every day | ORAL | Status: DC
  Administered 2021-11-15 – 2021-11-16 (×2): 10 mg via ORAL
  Filled 2021-11-15 (×2): qty 1

## 2021-11-15 MED ORDER — ALBUTEROL SULFATE (2.5 MG/3ML) 0.083% IN NEBU: 2.5000 mg | INHALATION_SOLUTION | Freq: Four times a day (QID) | RESPIRATORY_TRACT | Status: DC | PRN

## 2021-11-15 MED ORDER — ONDANSETRON HCL 4 MG/2ML IJ SOLN: 4.0000 mg | Freq: Four times a day (QID) | INTRAMUSCULAR | Status: DC | PRN

## 2021-11-15 MED ORDER — FUROSEMIDE 10 MG/ML IJ SOLN
40.0000 mg | Freq: Two times a day (BID) | INTRAMUSCULAR | Status: DC
  Administered 2021-11-15 – 2021-11-17 (×4): 40 mg via INTRAVENOUS
  Filled 2021-11-15 (×4): qty 4

## 2021-11-15 MED ORDER — ALBUTEROL SULFATE (2.5 MG/3ML) 0.083% IN NEBU
2.5000 mg | INHALATION_SOLUTION | RESPIRATORY_TRACT | Status: DC | PRN
  Filled 2021-11-15: qty 3

## 2021-11-15 MED ORDER — PREDNISONE 20 MG PO TABS
40.0000 mg | ORAL_TABLET | Freq: Every day | ORAL | Status: DC
  Administered 2021-11-16 – 2021-11-17 (×2): 40 mg via ORAL
  Filled 2021-11-15 (×2): qty 2

## 2021-11-15 MED ORDER — LORATADINE 10 MG PO TABS
10.0000 mg | ORAL_TABLET | Freq: Every day | ORAL | Status: DC
  Administered 2021-11-15 – 2021-11-16 (×2): 10 mg via ORAL
  Filled 2021-11-15 (×2): qty 1

## 2021-11-15 MED ORDER — ACETAMINOPHEN 650 MG RE SUPP: 650.0000 mg | Freq: Four times a day (QID) | RECTAL | Status: DC | PRN

## 2021-11-15 MED ORDER — IOHEXOL 300 MG/ML  SOLN
100.0000 mL | Freq: Once | INTRAMUSCULAR | Status: AC | PRN
  Administered 2021-11-15: 100 mL via INTRAVENOUS

## 2021-11-15 MED ORDER — ONDANSETRON HCL 4 MG PO TABS: 4.0000 mg | ORAL_TABLET | Freq: Four times a day (QID) | ORAL | Status: DC | PRN

## 2021-11-15 MED ORDER — FUROSEMIDE 10 MG/ML IJ SOLN
20.0000 mg | Freq: Once | INTRAMUSCULAR | Status: AC
  Administered 2021-11-15: 20 mg via INTRAVENOUS
  Filled 2021-11-15: qty 2

## 2021-11-15 NOTE — Progress Notes (Signed)
Patient refused BIPAP at this time. Decreased patients fio2 to 3lpm. .

## 2021-11-15 NOTE — Plan of Care (Signed)

## 2021-11-15 NOTE — Assessment & Plan Note (Addendum)
On admission blood pressures anywhere from 119/82 to 150/91.  Home blood pressure regimen includes amlodipine-benazepril 10-40 mg daily. -Continue amlodipine 10 mg daily

## 2021-11-15 NOTE — Progress Notes (Addendum)
Cross-coverage note:   Hypercarbia has worsened, patient remains awake and alert. Plan to start BiPAP now. The elevated potassium level is likely spurious, repeat chem panel is in process.   ADDENDUM:  Patient refusing BiPAP, does not feel like he needs it. We discussed rationale for it and concern that he will become obtunded and could die over night if CO2 continues to rise. He reports that he understands the concern but disagrees and does not believe he needs it.

## 2021-11-15 NOTE — ED Provider Notes (Signed)
[facility] DEPART[facility] Provider Note   CSN: [id number] Arrival date & time: 11/15/21  1002     History  No chief complaint on file.   Trevor Thomas is a 56 y.o. male.  The history is provided by the patient and medical records. No language interpreter was used.  Shortness of Breath Severity:  Moderate Onset quality:  Gradual Duration:  1 week Timing:  Constant Progression:  Worsening Chronicity:  New Context: not URI   Relieved by:  Nothing Worsened by:  Nothing Ineffective treatments:  None tried Associated symptoms: abdominal pain   Associated symptoms: no chest pain, no cough, no diaphoresis, no fever, no headaches, no hemoptysis, no sputum production, no vomiting and no wheezing       Home Medications Prior to Admission medications   Medication Sig Start Date End Date Taking? Authorizing Provider  acetaminophen (TYLENOL) 325 MG tablet Take 650 mg by mouth every 6 (six) hours as needed for moderate pain.    [provider]  albuterol (PROVENTIL HFA;VENTOLIN HFA) 108 (90 Base) MCG/ACT inhaler Inhale 2 puffs into the lungs every 6 (six) hours as needed for wheezing. 01/30/17   Eloise Levels, MD  amLODipine-benazepril (LOTREL) 10-40 MG capsule Take 1 capsule by mouth daily. 06/03/20   [provider]  loratadine (CLARITIN) 10 MG tablet Take 10 mg by mouth daily.    [provider]  sennosides-docusate sodium (SENOKOT-S) 8.6-50 MG tablet Take 2 tablets by mouth daily. Patient taking differently: Take 1 tablet by mouth daily as needed. 10/31/18   Marchia Bond, MD  SUBOXONE 4-1 MG FILM Place 4 Film under the tongue 4 (four) times daily. 06/03/20   [provider]      Allergies    Bee venom and Clonidine derivatives    Review of Systems   Review of Systems  Constitutional:  Positive for fatigue. Negative for chills, diaphoresis and fever.  HENT:  Negative for congestion.   Eyes:  Negative for visual  disturbance.  Respiratory:  Positive for shortness of breath. Negative for cough, hemoptysis, sputum production, chest tightness, wheezing and stridor.   Cardiovascular:  Positive for leg swelling. Negative for chest pain and palpitations.  Gastrointestinal:  Positive for abdominal pain. Negative for constipation, diarrhea, nausea and vomiting.  Genitourinary:  Negative for dysuria.  Musculoskeletal:  Negative for back pain.  Neurological:  Negative for light-headedness and headaches.  Psychiatric/Behavioral:  Negative for agitation.   All other systems reviewed and are negative.  Physical Exam Updated Vital Signs BP 123/86    Pulse (!) 114    Temp 98.1 F (36.7 C) (Oral)    Resp 17    SpO2 90%  Physical Exam Vitals and nursing note reviewed.  Constitutional:      General: He is not in acute distress.    Appearance: He is well-developed. He is not ill-appearing, toxic-appearing or diaphoretic.  HENT:     Head: Normocephalic and atraumatic.     Nose: No congestion or rhinorrhea.     Mouth/Throat:     Mouth: Mucous membranes are moist.     Pharynx: No oropharyngeal exudate or posterior oropharyngeal erythema.  Eyes:     Extraocular Movements: Extraocular movements intact.     Conjunctiva/sclera: Conjunctivae normal.     Pupils: Pupils are equal, round, and reactive to light.  Cardiovascular:     Rate and Rhythm: Normal rate and regular rhythm.     Heart sounds: No murmur heard. Pulmonary:  Effort: Pulmonary effort is normal. No respiratory distress.     Breath sounds: Rales present. No wheezing or rhonchi.  Chest:     Chest wall: No tenderness.  Abdominal:     General: Abdomen is flat. There is distension.     Palpations: Abdomen is soft.     Tenderness: There is no abdominal tenderness. There is no right CVA tenderness, left CVA tenderness, guarding or rebound.  Musculoskeletal:        General: No swelling or tenderness.     Cervical back: Neck supple. No tenderness.      Right lower leg: Edema present.     Left lower leg: Edema present.  Skin:    General: Skin is warm and dry.     Capillary Refill: Capillary refill takes less than 2 seconds.     Findings: No erythema.  Neurological:     General: No focal deficit present.     Mental Status: He is alert.     Sensory: No sensory deficit.     Motor: No weakness.  Psychiatric:        Mood and Affect: Mood normal.    ED Results / Procedures / Treatments   Labs (all labs ordered are listed, but only abnormal results are displayed) Labs Reviewed  URINALYSIS, ROUTINE W REFLEX MICROSCOPIC - Abnormal; Notable for the following components:      Result Value   Protein, ur 30 (*)    All other components within normal limits  CBC WITH DIFFERENTIAL/PLATELET - Abnormal; Notable for the following components:   RBC 5.96 (*)    HCT 55.1 (*)    MCHC 28.5 (*)    RDW 16.6 (*)    All other components within normal limits  COMPREHENSIVE METABOLIC PANEL - Abnormal; Notable for the following components:   Chloride 90 (*)    CO2 38 (*)    Glucose, Bld 109 (*)    Calcium 8.8 (*)    Total Protein 5.7 (*)    Albumin 2.9 (*)    All other components within normal limits  BRAIN NATRIURETIC PEPTIDE - Abnormal; Notable for the following components:   B Natriuretic Peptide 284.4 (*)    All other components within normal limits  I-STAT VENOUS BLOOD GAS, ED - Abnormal; Notable for the following components:   pCO2, Ven 65.6 (*)    pO2, Ven 51.0 (*)    Bicarbonate 42.1 (*)    TCO2 44 (*)    Acid-Base Excess 13.0 (*)    Sodium 133 (*)    Potassium 7.4 (*)    Calcium, Ion 0.96 (*)    HCT 54.0 (*)    Hemoglobin 18.4 (*)    All other components within normal limits  RESP PANEL BY RT-PCR (FLU A&B, COVID) ARPGX2  LIPASE, BLOOD  PROTIME-INR  BLOOD GAS, VENOUS  HIV ANTIBODY (ROUTINE TESTING W REFLEX)  C-REACTIVE PROTEIN  AMMONIA  TROPONIN I (HIGH SENSITIVITY)  TROPONIN I (HIGH SENSITIVITY)    EKG EKG  Interpretation  Date/Time:  Monday November 15 2021 10:14:15 EST Ventricular Rate:  99 PR Interval:  162 QRS Duration: 80 QT Interval:  330 QTC Calculation: 423 R Axis:   120 Text Interpretation: Normal sinus rhythm Right axis deviation Low voltage QRS Septal infarct , age undetermined Abnormal ECG when compared to prior, similar appearance. No STEMI Confirmed by Antony Blackbird [phone]) on 11/15/2021 4:37:06 PM  Radiology DG Chest 2 View  Result Date: 11/15/2021 CLINICAL DATA:  Cough, swelling, and shortness of  breath.  COPD. EXAM: CHEST - 2 VIEW COMPARISON:  10/28/2021 FINDINGS: Mild cardiomegaly. Indistinct interstitial accentuation at the lung bases, with some confluent linear opacities at the right lung base suggesting superimposed atelectasis. No overt cephalization of blood flow or Kerley B lines. IMPRESSION: 1. Mild enlargement of the cardiopericardial silhouette. Accentuated interstitium at the lung basis, nonspecific for early interstitial edema versus atypical pneumonia. 2. Suspected mild atelectasis in the right lower lobe. Electronically Signed   By: Van Clines M.D.   On: 11/15/2021 10:52   CT ABDOMEN PELVIS W CONTRAST  Result Date: 11/15/2021 CLINICAL DATA:  Acute nonlocalized abdominal pain, shortness of breath, cough, abdominal swelling EXAM: CT ABDOMEN AND PELVIS WITH CONTRAST TECHNIQUE: Multidetector CT imaging of the abdomen and pelvis was performed using the standard protocol following bolus administration of intravenous contrast. RADIATION DOSE REDUCTION: This exam was performed according to the departmental dose-optimization program which includes automated exposure control, adjustment of the mA and/or kV according to patient size and/or use of iterative reconstruction technique. CONTRAST:  135mL OMNIPAQUE IOHEXOL 300 MG/ML  SOLN COMPARISON:  10/28/2021 FINDINGS: Lower chest: Mild cardiomegaly with trace pericardial fluid. Very small right pleural effusion with bibasilar  atelectasis noted. Degenerative changes noted of the thoracic spine. Lower thoracic subcutaneous strandy body wall edema noted compatible with anasarca. Hepatobiliary: No large focal hepatic abnormality or biliary dilatation pattern. Focal fatty infiltration of the liver along the falciform ligament anteriorly. Patent portal vein. Gallbladder nondistended. Query noncalcified gallstones. Common bile duct nondilated. Small amount of perihepatic free fluid. Pancreas: Unremarkable. No pancreatic ductal dilatation or surrounding inflammatory changes. Spleen: Normal in size without focal abnormality. Adrenals/Urinary Tract: Adrenal glands are unremarkable. Kidneys are normal, without renal calculi, focal lesion, or hydronephrosis. Bladder is unremarkable. Stomach/Bowel: Negative for bowel obstruction, significant dilatation, ileus, or free air. Normal appearing appendix. No fluid collection, hemorrhage, hematoma, or abscess. Vascular/Lymphatic: Intact aorta. Aortoiliac atherosclerosis noted with tortuosity but no occlusive process. Mesenteric and renal vasculature all remain patent. No veno-occlusive finding or process. Negative for adenopathy. Reproductive: No significant finding by CT. Other: Intact abdominal wall. No hernia. Small amount of abdominopelvic ascites. Mild nonspecific ill-defined retroperitoneal strandy edema along the psoas muscles and also within the presacral space. Increased diffuse abdominal wall and pelvic subcutaneous edema compatible with anasarca. Musculoskeletal: Left hip arthroplasty changes noted. Degenerative changes throughout the spine. Lower lumbar facet arthropathy. No acute osseous finding. IMPRESSION: No acute intra-abdominal or pelvic finding by CT. Evidence of increased fluid retention with worsening anasarca, small volume of abdominopelvic ascites, and nonspecific retroperitoneal abdominopelvic free fluid/edema. Mild cardiomegaly and trace pericardial effusion. Small right pleural  effusion and bibasilar atelectasis. Query noncalcified gallstones Aortic Atherosclerosis (ICD10-I70.0). Electronically Signed   By: Jerilynn Mages.  Shick M.D.   On: 11/15/2021 12:34    Procedures Procedures    Medications Ordered in ED Medications  enoxaparin (LOVENOX) injection 40 mg (40 mg Subcutaneous Given 11/15/21 1527)  sodium chloride flush (NS) 0.9 % injection 3 mL (has no administration in time range)  acetaminophen (TYLENOL) tablet 650 mg (has no administration in time range)    Or  acetaminophen (TYLENOL) suppository 650 mg (has no administration in time range)  ondansetron (ZOFRAN) tablet 4 mg (has no administration in time range)    Or  ondansetron (ZOFRAN) injection 4 mg (has no administration in time range)  furosemide (LASIX) injection 40 mg (has no administration in time range)  nicotine (NICODERM CQ - dosed in mg/24 hours) patch 21 mg (has no administration in time range)  amLODipine (NORVASC) tablet 10 mg (has no administration in time range)    And  benazepril (LOTENSIN) tablet 40 mg (has no administration in time range)  buprenorphine-naloxone (SUBOXONE) 2-0.5 mg per SL tablet 2 tablet (2 tablets Sublingual Given 11/15/21 1528)  loratadine (CLARITIN) tablet 10 mg (has no administration in time range)  albuterol (PROVENTIL) (2.5 MG/3ML) 0.083% nebulizer solution 2.5 mg (has no administration in time range)  predniSONE (DELTASONE) tablet 40 mg (has no administration in time range)  iohexol (OMNIPAQUE) 300 MG/ML solution 100 mL (100 mLs Intravenous Contrast Given 11/15/21 1220)  furosemide (LASIX) injection 20 mg (20 mg Intravenous Given 11/15/21 1347)    ED Course/ Medical Decision Making/ A&P                           Medical Decision Making Amount and/or Complexity of Data Reviewed Labs: ordered.  Risk Prescription drug management. Decision regarding hospitalization.    Trevor Thomas is a 56 y.o. male with a past medical history significant for recent mission for  hypoxia and acute respiratory failure left AMA 2 and half weeks ago, previous opioid dependence on Suboxone, COPD but not on oxygen at home, hyperammonemia, hypertension, and previous GI bleeding who presents with worsening peripheral edema, abdominal discomfort, and shortness of breath.  According to patient, he has had worsening edema for the last week or so in the legs, abdomen, groin, hands, and face.  He has not been on any diuretics before and denies any history of heart failure.  He says that he is never on oxygen and has been having worsening shortness of breath recently.  He reports he is not having acute cough or chest pain.  Denies fevers or chills.  He reports he recently was admitted for pneumonia.  He reports his abdomen is getting more distended and aching at times.  He reports he has had some constipation but had a bowel movement earlier today without difficulty.  He denies any dysuria or hematuria but reports his edema has made it more difficult to urinate.  He reports no recent trauma and denies other complaints at this time.  Of note, oxygen saturations in the mid 80s on room air.  He was placed on 2 L with improvement into the 90s.  On exam, lungs have rales bilaterally.  No significant rhonchi or wheezing.  Chest and abdomen nontender.  Normal bowel sounds on my exam.  Back and flanks nontender.  Did not appreciate a murmur.  Patient has diffuse edema in his legs, arms, face, and abdomen.  Patient had work-up starting in triage.  CT of the abdomen pelvis showed some anasarca and some edema as well as some small volume ascites.  No large pocket to drain on my review of the images.  Imaging of the chest and CT show edema in the lungs.  Chest x-ray showed possible pneumonia however suspect is more related edema given lack of cough and lack of fevers or chills at this time.  Patient was really treated for pneumonia so we will likely talk to medicine about this.  Patient's kidney function and  liver function are not elevated however BNP is more elevated than several weeks ago.  Clinically I suspect that patient is having new heart failure that is worsening causing this ascites and peripheral edema.  His urine does not show evidence of UTI, just some protein in it.  Initial troponin was negative, will trend.  We will order some  Lasix and patient will need admission for likely echo and further oxygen support and determine etiology of his diffuse edema and likely new heart failure.    Patient be admitted for further management of worsened fluid overload leading to hypoxia.          Final Clinical Impression(s) / ED Diagnoses Final diagnoses:  Hypoxia  Peripheral edema  Shortness of breath     Clinical Impression: 1. Hypoxia   2. Peripheral edema   3. Shortness of breath     Disposition: Admit  This note was prepared with assistance of Dragon voice recognition software. Occasional wrong-word or sound-a-like substitutions may have occurred due to the inherent limitations of voice recognition software.      [name] Kunst, Gwenyth Allegra, MD 11/15/21 [phone]

## 2021-11-15 NOTE — Assessment & Plan Note (Deleted)
Patient noted to have swelling all over with concern for anasarca.  BNP was elevated at 284.4 higher than previous hospitalization last month.  Chest x-ray noted cardiomegaly with pericardial effusion and interstitial edema.  Patient has been given Lasix 20 mg IV.  Strict I&O's and daily weight  -Check echocardiogram -Check TSH -Continue Lasix 40 mg IV twice daily -PT to evaluate and treat in a.m. -Cardiology consulted follow-up for any further recommendations.

## 2021-11-15 NOTE — ED Provider Triage Note (Signed)
Emergency Medicine Provider Triage Evaluation Note  Trevor Thomas , a 56 y.o. male  was evaluated in triage.  Pt complains of shortness of breath and cough for the past 2 weeks.  He reports abdominal swelling gradually worsening for the past week.  He also thinks he may possibly have hemorrhoids.  He denies any nausea or vomiting.  He reports some abdominal distention.  Denies any pain when he urinates or blood in his urine, but mentions he has had to "milk" his penis in order to get urine to come out.  Review of Systems  Positive: abdominal pain, abdominal distention, shortness of breath, cough Negative: Nausea, vomiting, diarrhea, constipation, fevers, chest pain  Physical Exam  BP (!) 150/91 (BP Location: Left Arm)    Pulse 99    Temp 98.1 F (36.7 C) (Oral)    Resp (!) 22    SpO2 (!) 86%  Gen:   Awake, no distress   Resp:  Normal effort, wheezing heard throughout MSK:   Moves extremities without difficulty  Other:  Patient initially satting 86% on room air but is speaking in full sentences with ease.  Patient has a history of COPD although will put on 2 L of nasal cannula.  Patient has a large distended abdomen with diffuse skin changes overlying which patient reports is chronic from when he was a child.  Slightly hypoactive bowel sounds  Medical Decision Making  Medically screening exam initiated at 10:04 AM.  Appropriate orders placed.  Trevor Thomas was informed that the remainder of the evaluation will be completed by another provider, this initial triage assessment does not replace that evaluation, and the importance of remaining in the ED until their evaluation is complete.  Labs and imaging ordered   Achille Rich, [place] 11/15/21 1020
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
## 2021-11-15 NOTE — Progress Notes (Signed)
Heart Failure Nurse Navigator Progress Note  Following this hospitalization to assess for HV TOC readiness. Pt currently in ED.   Pending ECHO.   Will continue to follow.  Ozella Rocks, MSN, RN Heart Failure Nurse Navigator [phone]

## 2021-11-15 NOTE — Consult Note (Addendum)
Progress Note  Patient Name: Trevor Thomas Date of Encounter: 11/15/2021  Primary Cardiologist: None   Subjective   Trevor Thomas is a 56 year old male with past medical history of hypertension, TBI, OUD on Suboxone, depression, COPD who presents to the ED for worsening shortness of breath and swelling.  Said that he noticed worsening LE edema about 1 month ago.  Also had orthopnea with dyspnea on exertion.  States that his activities are limited due to dyspnea.  Endorses mild wheezing and intermittent productive cough.  He denies chest pain or palpitation.  Endorses normal urine output daily.  No GI issue.  Endorses adherence to his blood pressure medications.  He denies history of heart disease.  Does not see a cardiologist.  Used to smoke 1 pack a day but cut back to few cigarettes a day.  He quit alcohol 12 years ago.  Only smoked marijuana and denying other substance use.  Patient was seen in the ED on 1/16 for COPD exacerbation and left AMA.  He was admitted on 1/26 for acute hypoxic respiratory failure requiring BiPAP but also left AMA.   Inpatient Medications    Scheduled Meds:  amLODipine  10 mg Oral QHS   And   benazepril  40 mg Oral QHS   buprenorphine-naloxone  2 tablet Sublingual QID   enoxaparin (LOVENOX) injection  40 mg Subcutaneous Q24H   furosemide  40 mg Intravenous BID   loratadine  10 mg Oral QHS   [START ON 11/16/2021] predniSONE  40 mg Oral Q breakfast   sodium chloride flush  3 mL Intravenous Q12H   Continuous Infusions:  PRN Meds: acetaminophen **OR** acetaminophen, albuterol, nicotine, ondansetron **OR** ondansetron (ZOFRAN) IV   Vital Signs    Vitals:   11/15/21 1200 11/15/21 1314 11/15/21 1315 11/15/21 1600  BP: 119/82  (!) 124/91 121/84  Pulse: 83 (!) 101 86 95  Resp: 15 (!) 22 14 18   Temp:      TempSrc:      SpO2: 95% 97% 97% 93%   No intake or output data in the 24 hours ending 11/15/21 1614 There were no vitals filed for this  visit.  Telemetry      ECG    2/13 normal sinus rhythm, right axis deviation- Personally Reviewed  Physical Exam   GEN: No acute distress Neck: Positive JVD, close to mandible angle Cardiac: RRR, no murmurs, rubs, or gallops.  Respiratory: Diminished breath sound bilaterally.  No obvious wheezing GI: Distended, nontender, bowel sound present. MS: +2 edema bilateral LE up to bilateral knees.  Also have trace edema of abdominal wall.  Venous stasis dermatosis of bilateral LE noted.  Neuro:  Nonfocal  Psych: Normal affect   Labs    Chemistry Recent Labs  Lab 11/15/21 1020 11/15/21 1403  NA 136 133*  K 3.8 7.4*  CL 90*  --   CO2 38*  --   GLUCOSE 109*  --   BUN 6  --   CREATININE 0.73  --   CALCIUM 8.8*  --   PROT 5.7*  --   ALBUMIN 2.9*  --   AST 19  --   ALT 10  --   ALKPHOS 64  --   BILITOT 0.5  --   GFRNONAA >60  --   ANIONGAP 8  --      Hematology Recent Labs  Lab 11/15/21 1020 11/15/21 1403  WBC 5.6  --   RBC 5.96*  --   HGB 15.7 18.4*  HCT 55.1* 54.0*  MCV 92.4  --   MCH 26.3  --   MCHC 28.5*  --   RDW 16.6*  --   PLT 205  --     Cardiac EnzymesNo results for input(s): TROPONINI in the last 168 hours. No results for input(s): TROPIPOC in the last 168 hours.   BNP Recent Labs  Lab 11/15/21 1020  BNP 284.4*     DDimer No results for input(s): DDIMER in the last 168 hours.   Radiology    DG Chest 2 View  Result Date: 11/15/2021 CLINICAL DATA:  Cough, swelling, and shortness of breath.  COPD. EXAM: CHEST - 2 VIEW COMPARISON:  10/28/2021 FINDINGS: Mild cardiomegaly. Indistinct interstitial accentuation at the lung bases, with some confluent linear opacities at the right lung base suggesting superimposed atelectasis. No overt cephalization of blood flow or Kerley B lines. IMPRESSION: 1. Mild enlargement of the cardiopericardial silhouette. Accentuated interstitium at the lung basis, nonspecific for early interstitial edema versus atypical  pneumonia. 2. Suspected mild atelectasis in the right lower lobe. Electronically Signed   By: Gaylyn Rong M.D.   On: 11/15/2021 10:52   CT ABDOMEN PELVIS W CONTRAST  Result Date: 11/15/2021 CLINICAL DATA:  Acute nonlocalized abdominal pain, shortness of breath, cough, abdominal swelling EXAM: CT ABDOMEN AND PELVIS WITH CONTRAST TECHNIQUE: Multidetector CT imaging of the abdomen and pelvis was performed using the standard protocol following bolus administration of intravenous contrast. RADIATION DOSE REDUCTION: This exam was performed according to the departmental dose-optimization program which includes automated exposure control, adjustment of the mA and/or kV according to patient size and/or use of iterative reconstruction technique. CONTRAST:  OMNIPAQUE IOHEXOL 300 MG/ML  SOLN COMPARISON:  10/28/2021 FINDINGS: Lower chest: Mild cardiomegaly with trace pericardial fluid. Very small right pleural effusion with bibasilar atelectasis noted. Degenerative changes noted of the thoracic spine. Lower thoracic subcutaneous strandy body wall edema noted compatible with anasarca. Hepatobiliary: No large focal hepatic abnormality or biliary dilatation pattern. Focal fatty infiltration of the liver along the falciform ligament anteriorly. Patent portal vein. Gallbladder nondistended. Query noncalcified gallstones. Common bile duct nondilated. Small amount of perihepatic free fluid. Pancreas: Unremarkable. No pancreatic ductal dilatation or surrounding inflammatory changes. Spleen: Normal in size without focal abnormality. Adrenals/Urinary Tract: Adrenal glands are unremarkable. Kidneys are normal, without renal calculi, focal lesion, or hydronephrosis. Bladder is unremarkable. Stomach/Bowel: Negative for bowel obstruction, significant dilatation, ileus, or free air. Normal appearing appendix. No fluid collection, hemorrhage, hematoma, or abscess. Vascular/Lymphatic: Intact aorta. Aortoiliac atherosclerosis  noted with tortuosity but no occlusive process. Mesenteric and renal vasculature all remain patent. No veno-occlusive finding or process. Negative for adenopathy. Reproductive: No significant finding by CT. Other: Intact abdominal wall. No hernia. Small amount of abdominopelvic ascites. Mild nonspecific ill-defined retroperitoneal strandy edema along the psoas muscles and also within the presacral space. Increased diffuse abdominal wall and pelvic subcutaneous edema compatible with anasarca. Musculoskeletal: Left hip arthroplasty changes noted. Degenerative changes throughout the spine. Lower lumbar facet arthropathy. No acute osseous finding. IMPRESSION: No acute intra-abdominal or pelvic finding by CT. Evidence of increased fluid retention with worsening anasarca, small volume of abdominopelvic ascites, and nonspecific retroperitoneal abdominopelvic free fluid/edema. Mild cardiomegaly and trace pericardial effusion. Small right pleural effusion and bibasilar atelectasis. Query noncalcified gallstones Aortic Atherosclerosis (ICD10-I70.0). Electronically Signed   By: Judie Petit.  Shick M.D.   On: 11/15/2021 12:34    Cardiac Studies   Pending echocardiogram  Patient Profile     Carlo Guevarra is a 56 year old male  with past medical history of hypertension, TBI, OUD on Suboxone, depression, COPD who presents to the ED for worsening shortness of breath and swelling, currently being evaluated for new set of heart failure.  Assessment & Plan    Acute hypoxic respiratory failure Anasarca Possible new onset heart failure Low suspicion for hepatic cause with normal LFT, normal appearance on CT and no evidence of coagulopathy.  Nephrotic syndrome is on the differential but less likely given normal kidney function.  His degree of hypoalbuminemia should not cause the severity of anasarca.   Currently work-up for new onset of heart failure.  He has evidence of both right and left heart failure. Likely contributing from  Cor pulmonale 2/2 COPD. EKG was NSR with RBBB.  -Pending echocardiogram -Continue IV Lasix 40 mg BID.  Has put out 1.8 L  COPD exacerbation He Is likely to be a chronic CO2 retainer with normal pH and elevated CO2.  Denies symptoms of sleep apnea. -Nebulizer and prednisone per primary team  Hypertension -Continue amlodipine-benazepril for now  Chronic pain -Continue Suboxone per primary team  For questions or updates, please contact [facility] Please consult [email or web address] for contact info under Cardiology/STEMI.      Signed, Doran Stabler, DO  11/15/2021, 4:14 PM     I have seen and examined the patient along with Doran Stabler, DO .  I have reviewed the chart, notes and new data.  I agree with PA/NP's note.  Key new complaints: For approximately 1 month he has had exertional dyspnea and steadily worsening edema.  He also appears to describe orthopnea.  He does not have angina.  He has a longstanding history of smoking, 40 years of 1-1.5 packs/day, also "smoked a lot of weed" Key examination changes: He has anasarca with evidence of not only dependent edema of the lower extremities but also facial puffiness.  He has bilateral rhonchi and wheezes, but I do not hear any moist rales.  Regular rate and rhythm, no gallops or murmurs. Key new findings / data: Pulmonary function test performed in 2014 showed an FEV1 that was 70% of predicted (about 10 years ago).  ECG shows evidence of right atrial enlargement, but also possibly left atrial abnormality (he does have hypertension).  No ischemic changes are seen.  His chemistries suggest that he has chronic CO2 retention with metabolic alkalosis compensation.  PLAN: For the most part, the clinical picture is strongly suggestive of acute on chronic cor pulmonale/right heart failure due to advanced obstructive lung disease (also consider possible contribution of sleep apnea). The presence of orthopnea and what appears to be left atrial  enlargement on both ECG and lateral chest x-ray do raise concern for biventricular heart disease. For the time being would continue treatment with diuretics.  We will wait for echocardiogram before making additional recommendations.  Thurmon Fair, MD, [facility] [phone] 11/15/2021, 5:01 PM

## 2021-11-15 NOTE — ED Triage Notes (Signed)
Patient arrived with low sats in 80s and complains of swelling to abdomen and redness to same with tenderness to touch. Reports swelling has effected his urine output. Patient is smoker and recently took antibiotics for pneumonia

## 2021-11-15 NOTE — Assessment & Plan Note (Addendum)
Patient with history of tobacco abuse.  PCP had recently referred him to pulmonology but was unable to make the appointment today due to coming into the hospital.  Noted to have mild expiratory wheezes on physical exam.  He had just recently completed doxycycline and steroid taper by PCP in the outpatient setting.  Reports cough has improved since that time. -Albuterol nebs as needed for shortness of breath/wheezing -Prednisone 40 mg daily

## 2021-11-15 NOTE — Assessment & Plan Note (Deleted)
Patient with history of tobacco abuse.  PCP had recently referred him to pulmonology but was unable to make the appointment today due to coming into the hospital.  Noted to have mild expiratory wheezes on physical exam.  He had just recently completed doxycycline and steroid taper by PCP in the outpatient setting. -Albuterol nebs as needde for shortness of breath/wheezing

## 2021-11-15 NOTE — Assessment & Plan Note (Addendum)
-   with anasarca- see above

## 2021-11-15 NOTE — Assessment & Plan Note (Addendum)
Patient has history of chronic back pain.  Currently onbuprenorphine HCL-Naloxone 4- 1 mg 4 times daily. -Continue pharmacy substitution with Suboxone as tolerated

## 2021-11-15 NOTE — Assessment & Plan Note (Signed)
Continue to counsel on the cessation of tobacco use.  Reports over 20 smoking pack-year history of tobacco abuse. -Continue counseling on the cessation of tobacco use -Nicotine patch as needed

## 2021-11-15 NOTE — Assessment & Plan Note (Addendum)
-   Continue diuresis with IV Lasix and BiPAP - 2D echo reveals an EF of 60 to 65%, severely elevated pulmonary artery systolic pressure and RV pressure and volume overload with a small pericardial effusion-findings consistent with cor pulmonale - PCCM also consulted for suspected COPD and high-resolution CT obtained but images were degraded by motion artifact >  collapse/consolidation in the lung bases, bibasilar atelectasis and or pneumonia-recommend CT follow-up in 3 to 4 weeks to ensure no centrally obstructing mass -The patient is currently receiving Pulmicort twice daily, Brovana twice daily and prednisone 40 mg daily

## 2021-11-16 ENCOUNTER — Inpatient Hospital Stay (HOSPITAL_COMMUNITY): Payer: Medicaid Other

## 2021-11-16 DIAGNOSIS — J441 Chronic obstructive pulmonary disease with (acute) exacerbation: Secondary | ICD-10-CM

## 2021-11-16 DIAGNOSIS — J9601 Acute respiratory failure with hypoxia: Secondary | ICD-10-CM

## 2021-11-16 DIAGNOSIS — J9602 Acute respiratory failure with hypercapnia: Secondary | ICD-10-CM

## 2021-11-16 DIAGNOSIS — I509 Heart failure, unspecified: Secondary | ICD-10-CM

## 2021-11-16 DIAGNOSIS — Z720 Tobacco use: Secondary | ICD-10-CM

## 2021-11-16 DIAGNOSIS — I5033 Acute on chronic diastolic (congestive) heart failure: Secondary | ICD-10-CM

## 2021-11-16 DIAGNOSIS — I77810 Thoracic aortic ectasia: Secondary | ICD-10-CM

## 2021-11-16 DIAGNOSIS — I2729 Other secondary pulmonary hypertension: Secondary | ICD-10-CM

## 2021-11-16 HISTORY — DX: Thoracic aortic ectasia: I77.810

## 2021-11-16 LAB — ECHOCARDIOGRAM COMPLETE
AR max vel: 3.2 cm2
AV Area VTI: 3.18 cm2
AV Area mean vel: 3.23 cm2
AV Mean grad: 5 mmHg
AV Peak grad: 10.1 mmHg
Ao pk vel: 1.59 m/s
Area-P 1/2: 4.1 cm2
Height: 71 in
MV VTI: 3.24 cm2
S' Lateral: 2.7 cm
Weight: 4500.91 oz

## 2021-11-16 LAB — PREALBUMIN: Prealbumin: 10.5 mg/dL — ABNORMAL LOW (ref 18–38)

## 2021-11-16 MED ORDER — ARFORMOTEROL TARTRATE 15 MCG/2ML IN NEBU
15.0000 ug | INHALATION_SOLUTION | Freq: Two times a day (BID) | RESPIRATORY_TRACT | Status: DC
  Administered 2021-11-16 – 2021-11-17 (×2): 15 ug via RESPIRATORY_TRACT
  Filled 2021-11-16 (×2): qty 2

## 2021-11-16 MED ORDER — BUDESONIDE 0.5 MG/2ML IN SUSP
0.5000 mg | Freq: Two times a day (BID) | RESPIRATORY_TRACT | Status: DC
  Administered 2021-11-16 – 2021-11-17 (×2): 0.5 mg via RESPIRATORY_TRACT
  Filled 2021-11-16 (×2): qty 2

## 2021-11-16 NOTE — Progress Notes (Addendum)
Progress Note  Patient Name: Trevor Thomas Date of Encounter: 11/16/2021  Primary Cardiologist: None   Subjective   Patient is seen at bedside. He is alert, awake and not in any respiratory distress. He reports taking off the nasal cannula because it makes his nose dry. Denies feeling lightheaded or dizziness.  O2 sat was 72% on RA, improved to 90% on 7L.   Inpatient Medications    Scheduled Meds:  amLODipine  10 mg Oral QHS   And   benazepril  40 mg Oral QHS   buprenorphine-naloxone  2 tablet Sublingual QID   enoxaparin (LOVENOX) injection  40 mg Subcutaneous Q24H   furosemide  40 mg Intravenous BID   loratadine  10 mg Oral QHS   predniSONE  40 mg Oral Q breakfast   sodium chloride flush  3 mL Intravenous Q12H   Continuous Infusions:  PRN Meds: acetaminophen **OR** acetaminophen, albuterol, nicotine, ondansetron **OR** ondansetron (ZOFRAN) IV   Vital Signs    Vitals:   11/15/21 2001 11/16/21 0037 11/16/21 0400 11/16/21 0500  BP: 118/86 123/74 122/80   Pulse: 93 94 99   Resp: 18 16 16    Temp: 98.3 F (36.8 C) 97.8 F (36.6 C) 98 F (36.7 C)   TempSrc: Oral Oral Oral   SpO2: 91% 90% 92%   Weight:  127.6 kg  127.6 kg  Height: 5\' 11"  (1.803 m)       Intake/Output Summary (Last 24 hours) at 11/16/2021 0841 Last data filed at 11/15/2021 2200 Gross per 24 hour  Intake 480 ml  Output 2850 ml  Net -2370 ml   Filed Weights   11/16/21 0037 11/16/21 0500  Weight: 127.6 kg 127.6 kg    Telemetry    Sinus tachycardia - Personally Reviewed  ECG   2/13 normal sinus rhythm, right axis deviation- Personally Reviewed  Physical Exam   GEN: No acute distress. Alert and awake  Neck: Positive JVD Cardiac: tachycardic, regular, no murmurs, rubs, or gallops.  Respiratory: diffuse expiratory wheezing GI: bowel sound present MS: +2 LE edema up to bilat knees. Extremities are warm to touch Neuro:  Nonfocal  Psych: Normal affect   Labs    Chemistry Recent Labs   Lab 11/15/21 1020 11/15/21 1403 11/15/21 1846  NA 136 133* 137  K 3.8 7.4* 3.8  CL 90*  --  90*  CO2 38*  --  40*  GLUCOSE 109*  --  83  BUN 6  --  5*  CREATININE 0.73  --  0.70  CALCIUM 8.8*  --  8.7*  PROT 5.7*  --   --   ALBUMIN 2.9*  --   --   AST 19  --   --   ALT 10  --   --   ALKPHOS 64  --   --   BILITOT 0.5  --   --   GFRNONAA >60  --  >60  ANIONGAP 8  --  7     Hematology Recent Labs  Lab 11/15/21 1020 11/15/21 1403  WBC 5.6  --   RBC 5.96*  --   HGB 15.7 18.4*  HCT 55.1* 54.0*  MCV 92.4  --   MCH 26.3  --   MCHC 28.5*  --   RDW 16.6*  --   PLT 205  --     Cardiac EnzymesNo results for input(s): TROPONINI in the last 168 hours. No results for input(s): TROPIPOC in the last 168 hours.   BNP Recent Labs  Lab 11/15/21 1020  BNP 284.4*     DDimer No results for input(s): DDIMER in the last 168 hours.   Radiology    DG Chest 2 View  Result Date: 11/15/2021 CLINICAL DATA:  Cough, swelling, and shortness of breath.  COPD. EXAM: CHEST - 2 VIEW COMPARISON:  10/28/2021 FINDINGS: Mild cardiomegaly. Indistinct interstitial accentuation at the lung bases, with some confluent linear opacities at the right lung base suggesting superimposed atelectasis. No overt cephalization of blood flow or Kerley B lines. IMPRESSION: 1. Mild enlargement of the cardiopericardial silhouette. Accentuated interstitium at the lung basis, nonspecific for early interstitial edema versus atypical pneumonia. 2. Suspected mild atelectasis in the right lower lobe. Electronically Signed   By: Gaylyn Rong M.D.   On: 11/15/2021 10:52   CT ABDOMEN PELVIS W CONTRAST  Result Date: 11/15/2021 CLINICAL DATA:  Acute nonlocalized abdominal pain, shortness of breath, cough, abdominal swelling EXAM: CT ABDOMEN AND PELVIS WITH CONTRAST TECHNIQUE: Multidetector CT imaging of the abdomen and pelvis was performed using the standard protocol following bolus administration of intravenous contrast.  RADIATION DOSE REDUCTION: This exam was performed according to the departmental dose-optimization program which includes automated exposure control, adjustment of the mA and/or kV according to patient size and/or use of iterative reconstruction technique. CONTRAST:  OMNIPAQUE IOHEXOL 300 MG/ML  SOLN COMPARISON:  10/28/2021 FINDINGS: Lower chest: Mild cardiomegaly with trace pericardial fluid. Very small right pleural effusion with bibasilar atelectasis noted. Degenerative changes noted of the thoracic spine. Lower thoracic subcutaneous strandy body wall edema noted compatible with anasarca. Hepatobiliary: No large focal hepatic abnormality or biliary dilatation pattern. Focal fatty infiltration of the liver along the falciform ligament anteriorly. Patent portal vein. Gallbladder nondistended. Query noncalcified gallstones. Common bile duct nondilated. Small amount of perihepatic free fluid. Pancreas: Unremarkable. No pancreatic ductal dilatation or surrounding inflammatory changes. Spleen: Normal in size without focal abnormality. Adrenals/Urinary Tract: Adrenal glands are unremarkable. Kidneys are normal, without renal calculi, focal lesion, or hydronephrosis. Bladder is unremarkable. Stomach/Bowel: Negative for bowel obstruction, significant dilatation, ileus, or free air. Normal appearing appendix. No fluid collection, hemorrhage, hematoma, or abscess. Vascular/Lymphatic: Intact aorta. Aortoiliac atherosclerosis noted with tortuosity but no occlusive process. Mesenteric and renal vasculature all remain patent. No veno-occlusive finding or process. Negative for adenopathy. Reproductive: No significant finding by CT. Other: Intact abdominal wall. No hernia. Small amount of abdominopelvic ascites. Mild nonspecific ill-defined retroperitoneal strandy edema along the psoas muscles and also within the presacral space. Increased diffuse abdominal wall and pelvic subcutaneous edema compatible with anasarca.  Musculoskeletal: Left hip arthroplasty changes noted. Degenerative changes throughout the spine. Lower lumbar facet arthropathy. No acute osseous finding. IMPRESSION: No acute intra-abdominal or pelvic finding by CT. Evidence of increased fluid retention with worsening anasarca, small volume of abdominopelvic ascites, and nonspecific retroperitoneal abdominopelvic free fluid/edema. Mild cardiomegaly and trace pericardial effusion. Small right pleural effusion and bibasilar atelectasis. Query noncalcified gallstones Aortic Atherosclerosis (ICD10-I70.0). Electronically Signed   By: Judie Petit.  Shick M.D.   On: 11/15/2021 12:34    Cardiac Studies   Pending echocardiogram  Patient Profile     56 y.o. 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  Assessment & Plan    Acute hypoxic respiratory failure Anasarca Possible new onset heart failure Pending echocardiogram to evaluate for new onset of heart failure.  He has evidence of both right and left heart failure.  Likely contributing from cor pulmonale secondary to severe COPD. He has good urine output overnight.  Electrolytes and kidney function WNL.  -Will continue IV Lasix -Pending echocardiogram   COPD exacerbation He is likely to be a chronic CO2 retainer with normal pH and elevated bicarb.  He can possibly have OSA as well.  CO2 on VBG continue trending up last night but patient declined BiPAP.  His mental status was at baseline.  -Patient agrees to Bipap this AM -Nebulizer and prednisone per primary team. Still has a lot of wheezing.  -Recommend repeat PFT after discharge   Hypertension -Continue amlodipine-benazepril for now. BP remains well controlled.   Chronic pain -Continue Suboxone per primary team  For questions or updates, please  contact [facility] Please consult [email or web address] for contact info under Cardiology/STEMI.      Signed, Doran Stabler, DO  11/16/2021, 8:41 AM     I have seen and examined the patient along with Doran Stabler, DO.  I have reviewed the chart, notes and new data.  I agree with his note.  Key new complaints: He is feeling better and the edema has diminished. Key examination changes: He appears intermittently somnolent.  I worry that he has obstructive sleep apnea in addition to emphysema.  Still markedly edematous with elevated jugular venous pulsations Key new findings / data: Reviewed preliminary images of echocardiogram at bedside.  He appears to have a severely enlarged right ventricle, albeit with preserved systolic function.  Systolic pulmonary artery pressure is at least 50 mmHg and could be much higher since he has evidence of systolic flattening of the interventricular septum.  There is moderate tricuspid regurgitation but there are no other serious valvular abnormalities.  He does have left ventricular hypertrophy, left atrial enlargement and a mildly dilated ascending aorta, all probably related to hypertension.  PLAN: Acute on chronic heart failure with preserved left ventricular ejection fraction and biventricular manifestations. The dominant abnormality is severe acute on chronic cor pulmonale, due to chronic obstructive lung disease +/- obstructive sleep apnea. Also has evidence of diastolic left ventricular dysfunction, probably due to insufficiently treated hypertension. Continue diuretics. Critically important to stop smoking. Repeat pulmonary function tests outside of acute illness. He will need a sleep study. Have sent off a sample of blood for alpha-1 antitrypsin deficiency.  Thurmon Fair, MD, [facility] [phone] 11/16/2021, 4:22 PM

## 2021-11-16 NOTE — Progress Notes (Signed)
°  Echocardiogram 2D Echocardiogram has been performed.  Gerda Diss 11/16/2021, 4:40 PM

## 2021-11-16 NOTE — Evaluation (Signed)
Physical Therapy Evaluation Patient Details Name: Trevor Thomas MRN: [id number] DOB: January 08, 1966 Today's Date: 11/16/2021  History of Present Illness  Patient is a 56 y/o male who presents on 11/15/21 with swelling throughout LE, UE, abdomen and SOB. Admitted with acute respiratory failure with hypoxia and hypercapnia as well as new onset of heart failure with pericardial effusion. ADmitted 1/26-1/27 for COPD exacerbation and left AMA. PMH includes TBI. COPD, anxiety, left THA, HTN, remote alcohol abuse, tobacco abuse, marijuana abuse, chronic pain.  Clinical Impression  Patient presents with dyspnea on exertion and impaired mobility s/p above. Pt is Mod I with RW PTA and cares for self at baseline. Today, pt requires 8L/min 02 [place] to maintain Sp02 >90%. Sp02 drops to 85% on RA at rest. Has been using BiPAP PRN all day. Tolerated ambulation with use of RW and supervision for safety and to manage lines. Swelling throughout feet, LEs and abdomen which pt reports is improving. Pt likely close to baseline functionally except having to use supplemental 02. Will follow up likely 1-2 more times to ensure pt maintains strength/mobility while in the hospital and to prevent deconditioning as well as wean 02. Encouraged walking in hallway a few times daily.      Recommendations for follow up therapy are one component of a multi-disciplinary discharge planning process, led by the attending physician.  Recommendations may be updated based on patient status, additional functional criteria and insurance authorization.  Follow Up Recommendations No PT follow up    Assistance Recommended at Discharge PRN  Patient can return home with the following  Assistance with cooking/housework;Help with stairs or ramp for entrance    Equipment Recommendations None recommended by PT  Recommendations for Other Services       Functional Status Assessment Patient has had a recent decline in their functional status and  demonstrates the ability to make significant improvements in function in a reasonable and predictable amount of time.     Precautions / Restrictions Precautions Precautions: Fall;Other (comment) Precaution Comments: BiPAP PRN Restrictions Weight Bearing Restrictions: No      Mobility  Bed Mobility Overal bed mobility: Modified Independent             General bed mobility comments: HOB elevated.    Transfers Overall transfer level: Needs assistance Equipment used: Rolling walker (2 wheels) Transfers: Sit to/from Stand Sit to Stand: Supervision           General transfer comment: Supervision for safety; impulsive.    Ambulation/Gait   Gait Distance (Feet): 200 Feet Assistive device: Rolling walker (2 wheels) Gait Pattern/deviations: Step-through pattern, Decreased stride length, Shuffle   Gait velocity interpretation: >2.62 ft/sec, indicative of community ambulatory   General Gait Details: Good walking pace with decreased foot clearance bilaterally and flexed trunk with cues for RW proximity. Sp02 remained >90% on 8L/min 02 [place]. 2/4 DOE.  Stairs            Wheelchair Mobility    Modified Rankin (Stroke Patients Only)       Balance Overall balance assessment: Needs assistance Sitting-balance support: Feet supported, No upper extremity supported Sitting balance-Leahy Scale: Good Sitting balance - Comments: Able to donn sandals sitting EOB without difficulty.   Standing balance support: During functional activity, Reliant on assistive device for balance Standing balance-Leahy Scale: Fair                               Pertinent Vitals/Pain Pain  Assessment Pain Assessment: Faces Faces Pain Scale: Hurts little more Pain Location: headache Pain Descriptors / Indicators: Headache Pain Intervention(s): Monitored during session, Repositioned    Home Living Family/patient expects to be discharged to:: Private residence Living  Arrangements: Children;Non-relatives/Friends Available Help at Discharge: Family;Available PRN/intermittently Type of Home: Mobile home Home Access: Stairs to enter Entrance Stairs-Rails: Right;Left Entrance Stairs-Number of Steps: 3   Home Layout: One level Home Equipment: Agricultural consultant (2 wheels)      Prior Function Prior Level of Function : Independent/Modified Independent             Mobility Comments: Uses RW for ambulation, drives. Does not work.       Hand Dominance   Dominant Hand:  (both)    Extremity/Trunk Assessment   Upper Extremity Assessment Upper Extremity Assessment: Defer to OT evaluation    Lower Extremity Assessment Lower Extremity Assessment: Overall WFL for tasks assessed    Cervical / Trunk Assessment Cervical / Trunk Assessment: Normal  Communication   Communication: No difficulties  Cognition Arousal/Alertness: Awake/alert Behavior During Therapy: WFL for tasks assessed/performed Overall Cognitive Status: No family/caregiver present to determine baseline cognitive functioning                                 General Comments: Follows commands well, appropriate for simple tasks.        General Comments General comments (skin integrity, edema, etc.): Swelling present in bil feet, LEs, abdomen. Reports it is improving.    Exercises     Assessment/Plan    PT Assessment Patient needs continued PT services  PT Problem List Decreased mobility;Cardiopulmonary status limiting activity;Decreased activity tolerance       PT Treatment Interventions Therapeutic exercise;Therapeutic activities;Patient/family education;Functional mobility training;Stair training    PT Goals (Current goals can be found in the Care Plan section)  Acute Rehab PT Goals Patient Stated Goal: to go home PT Goal Formulation: With patient Time For Goal Achievement: 11/30/21 Potential to Achieve Goals: Good    Frequency Min 2X/week      Co-evaluation               AM-PAC PT "6 Clicks" Mobility  Outcome Measure Help needed turning from your back to your side while in a flat bed without using bedrails?: None Help needed moving from lying on your back to sitting on the side of a flat bed without using bedrails?: A Little Help needed moving to and from a bed to a chair (including a wheelchair)?: A Little Help needed standing up from a chair using your arms (e.g., wheelchair or bedside chair)?: A Little Help needed to walk in hospital room?: A Little Help needed climbing 3-5 steps with a railing? : A Little 6 Click Score: 19    End of Session Equipment Utilized During Treatment: Oxygen Activity Tolerance: Patient tolerated treatment well Patient left: in bed;with call bell/phone within reach Nurse Communication: Mobility status PT Visit Diagnosis: Other abnormalities of gait and mobility (R26.89)    Time: 1431-1451 PT Time Calculation (min) (ACUTE ONLY): 20 min   Charges:   PT Evaluation $PT Eval Moderate Complexity: 1 Mod          Vale Haven, PT, DPT Acute Rehabilitation Services Pager [phone] Office [phone]     Blake Divine A Lanier [place]ign 11/16/2021, 4:10 PM

## 2021-11-16 NOTE — Progress Notes (Signed)
NAME:  Trevor Thomas, MRN:  [id number], DOB:  January 18, 1966, LOS: 1 ADMISSION DATE:  11/15/2021, CONSULTATION DATE: November 16, 2021 REFERRING MD: Dr. Benny Lennert, CHIEF COMPLAINT: Dyspnea  History of Present Illness:  56 year old male with a past medical history significant for tobacco abuse presented to [facility] on February 14 complaining of shortness of breath.  Noted to have hypoxemic respiratory failure in the setting of massive edema as well as hypercarbia noted on ABG.  The patient had recently been hospitalized for similar symptoms and left AGAINST MEDICAL ADVICE.  The patient states that he had been referred to pulmonary but had never actually seen pulmonary in clinic.  He was actually supposed to see [place] on February 13 but then ended up in the emergency room with the above listed symptoms instead.  He says that in the last year he has not had much in the way of shortness of breath and he has continued to work in [employer] without too much difficulty.  However in the last several weeks he has noticed increasing swelling and progressive shortness of breath.  He says that he snores but has never been told that he quits breathing.  He says that he feels well rested when he wakes up in the morning.  He has noted significant swelling in his ankles as well as in his belly over the last several weeks.  This is actually what prompted him to come to the emergency department yesterday.  He says that he coughs a little bit when he walks out into the cold but he does not produce much mucus.  He cut back on cigarette smoking in the last few years.  He says he did this because of respiratory complaints (question cough?).   Pertinent  Medical History  Cigarette smoker started smoking as a teenager, smoked as much as 1 pack/day, continues to smoke in 2023 History of GIB bleed History of alcohol abuse History of traumatic brain injury Hypertension Marijuana use Anxiety   Significant Hospital  Events: Including procedures, antibiotic start and stop dates in addition to other pertinent events   2/13 admission  Imaging: 2/14 echocardiogram: LVEF 60 to 65%, RV function is moderately reduced, ventricular size is severely enlarged with RVSP 63.9 mmHg  Interim History / Subjective:  As above  Objective   Blood pressure 120/80, pulse 96, temperature (!) 97.1 F (36.2 C), temperature source Axillary, resp. rate 16, height 5\' 11"  (1.803 m), weight 127.6 kg, SpO2 95 %.        Intake/Output Summary (Last 24 hours) at 11/16/2021 1806 Last data filed at 11/16/2021 1756 Gross per 24 hour  Intake 1200 ml  Output 3000 ml  Net -1800 ml   Filed Weights   11/16/21 0037 11/16/21 0500  Weight: 127.6 kg 127.6 kg    Examination:  General:  Resting comfortably in bed HENT: NCAT OP clear PULM: Wheezing bilaterally B, normal effort CV: RRR, no mgr GI: BS+, soft, non-tender, bulging flanks,  MSK: normal bulk and tone Derm: massive edema Neuro: awake, alert, no distress, MAEW  Resolved Hospital Problem list     Assessment & Plan:  Acute on chronic hypercarbic and hypoxemic respiratory failure Cor pulmonale Pulmonary hypertension Presumed COPD, no PFTs on record Wheezing Anasarca Cigarette smoker  Discussion: Is a very complicated 56 year old male who was admitted with dyspnea, anasarca secondary to right heart failure which is presumably due to cor pulmonale from advanced lung disease.  I suspect that he has advanced COPD though he is  never had pulmonary function testing.  His primary symptoms at this point are due to hypercarbia which is likely multifactorial from obesity hypoventilation syndrome, heart failure, and underlying lung disease.  The main goal of this hospitalization should be to diurese him, obtain imaging to start a work-up for underlying lung disease, and consider right heart catheterization.  Plan: Continue diuresis I will obtain a high-resolution CT scan of the  chest Will need outpatient PFT We we will treat his presumptive diagnosis of COPD with bronchodilators: Add Brovana and Pulmicort for now, as needed albuterol Would consider right heart catheterization prior to hospital discharge but after adequate diuresis Tobacco abuse counseling Noninvasive mechanical ventilation at night, would repeat ABG after several days of diuresis to see if he will still need this at home.  Pulmonary and critical care will continue to follow  Best Practice (right click and "Reselect all SmartList Selections" daily)   Per primary  Labs   CBC: Recent Labs  Lab 11/15/21 1020 11/15/21 1403  WBC 5.6  --   NEUTROABS 3.4  --   HGB 15.7 18.4*  HCT 55.1* 54.0*  MCV 92.4  --   PLT 205  --     Basic Metabolic Panel: Recent Labs  Lab 11/15/21 1020 11/15/21 1403 11/15/21 1846  NA 136 133* 137  K 3.8 7.4* 3.8  CL 90*  --  90*  CO2 38*  --  40*  GLUCOSE 109*  --  83  BUN 6  --  5*  CREATININE 0.73  --  0.70  CALCIUM 8.8*  --  8.7*   GFR: Estimated Creatinine Clearance: 142 mL/min (by C-G formula based on SCr of 0.7 mg/dL). Recent Labs  Lab 11/15/21 1020  WBC 5.6    Liver Function Tests: Recent Labs  Lab 11/15/21 1020  AST 19  ALT 10  ALKPHOS 64  BILITOT 0.5  PROT 5.7*  ALBUMIN 2.9*   Recent Labs  Lab 11/15/21 1020  LIPASE 21   Recent Labs  Lab 11/15/21 1848  AMMONIA 59*    ABG    Component Value Date/Time   HCO3 41.6 (H) 11/15/2021 1848   TCO2 44 (H) 11/15/2021 1403   O2SAT 89.1 11/15/2021 1848     Coagulation Profile: Recent Labs  Lab 11/15/21 1353  INR 1.2    Cardiac Enzymes: No results for input(s): CKTOTAL, CKMB, CKMBINDEX, TROPONINI in the last 168 hours.  HbA1C: Hgb A1c MFr Bld  Date/Time Value Ref Range Status  06/16/2016 06:05 PM 5.4 4.8 - 5.6 % Final    Comment:    (NOTE)         Pre-diabetes: 5.7 - 6.4         Diabetes: >6.4         Glycemic control for adults with diabetes: <7.0   10/26/2010  10:39 AM 5.4 %     CBG: No results for input(s): GLUCAP in the last 168 hours.  Review of Systems:   Gen: Denies fever, chills, weight change, fatigue, night sweats HEENT: Denies blurred vision, double vision, hearing loss, tinnitus, sinus congestion, rhinorrhea, sore throat, neck stiffness, dysphagia PULM: per HPI CV: per HPI GI: Denies abdominal pain, nausea, vomiting, diarrhea, hematochezia, melena, constipation, change in bowel habits GU: Denies dysuria, hematuria, polyuria, oliguria, urethral discharge Endocrine: Denies hot or cold intolerance, polyuria, polyphagia or appetite change Derm: Denies rash, dry skin, scaling or peeling skin change Heme: Denies easy bruising, bleeding, bleeding gums Neuro: Denies headache, numbness, weakness, slurred speech, loss of  memory or consciousness   Past Medical History:  He,  has a past medical history of 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 Cornett, MD;  Location: [facility];  Service: General;  Laterality: N/A;     Social History:   reports that he has been smoking cigarettes. He has a 30.00 pack-year smoking history. He has never used smokeless tobacco. He reports that he does not currently use alcohol. He reports current drug use. Drug: Marijuana.   Family History:  His family history includes Diabetes in his father and mother; Heart failure in his brother.   Allergies Allergies  Allergen Reactions   Bee Venom Anaphylaxis   Clonidine Derivatives Other (See Comments)    Stomach pain     Home Medications  Prior to Admission medications   Medication Sig Start Date End Date Taking? Authorizing Provider  acetaminophen (TYLENOL) 500 MG tablet Take 500 mg by mouth at bedtime.   Yes [provider]  amLODipine-benazepril (LOTREL) 10-40 MG capsule Take 1 capsule by mouth at bedtime. 06/03/20  Yes [provider]  Buprenorphine HCl-Naloxone HCl 4-1 MG FILM Place 1 Film under the tongue 4 (four) times daily.   Yes [provider]  loratadine (CLARITIN) 10 MG tablet Take 10 mg by mouth at bedtime.   Yes [provider]  albuterol (PROVENTIL HFA;VENTOLIN HFA) 108 (90 Base) MCG/ACT inhaler Inhale 2 puffs into the lungs every 6 (six) hours as needed for wheezing. Patient not taking: Reported on 11/15/2021 01/30/17   Eloise Levels, MD  doxycycline (MONODOX) 100 MG capsule Take 100 mg by mouth 2 (two) times daily. Patient not taking: Reported on 11/15/2021 11/02/21   [provider]     Critical care time: n/a     Roselie Awkward, MD [place] [facility] Pager: [phone] Cell: [phone] After 7:00 pm call Elink  [phone]

## 2021-11-17 ENCOUNTER — Inpatient Hospital Stay (HOSPITAL_COMMUNITY): Payer: Medicaid Other

## 2021-11-17 DIAGNOSIS — J9601 Acute respiratory failure with hypoxia: Secondary | ICD-10-CM | POA: Diagnosis not present

## 2021-11-17 DIAGNOSIS — J9602 Acute respiratory failure with hypercapnia: Secondary | ICD-10-CM | POA: Diagnosis not present

## 2021-11-17 DIAGNOSIS — I7121 Aneurysm of the ascending aorta, without rupture: Secondary | ICD-10-CM | POA: Insufficient documentation

## 2021-11-17 LAB — BLOOD GAS, ARTERIAL
Acid-Base Excess: 27 mmol/L — ABNORMAL HIGH (ref 0.0–2.0)
Bicarbonate: 60.1 mmol/L — ABNORMAL HIGH (ref 20.0–28.0)
Drawn by: 137461
FIO2: 44 %
O2 Saturation: 91.5 %
Patient temperature: 37
pCO2 arterial: 104 mmHg (ref 32–48)
pH, Arterial: 7.37 (ref 7.35–7.45)
pO2, Arterial: 61 mmHg — ABNORMAL LOW (ref 83–108)

## 2021-11-17 LAB — BASIC METABOLIC PANEL
BUN: 9 mg/dL (ref 6–20)
CO2: >45 mmol/L — ABNORMAL HIGH (ref 22–32)
Calcium: 9 mg/dL (ref 8.9–10.3)
Chloride: 85 mmol/L — ABNORMAL LOW (ref 98–111)
Creatinine, Ser: 0.68 mg/dL (ref 0.61–1.24)
GFR, Estimated: >60 mL/min (ref >60)
Glucose, Bld: 118 mg/dL — ABNORMAL HIGH (ref 70–99)
Potassium: 4.9 mmol/L (ref 3.5–5.1)
Sodium: 138 mmol/L (ref 135–145)

## 2021-11-17 LAB — CBC WITH DIFFERENTIAL/PLATELET
Abs Immature Granulocytes: 0.01 10*3/uL (ref 0.00–0.07)
Basophils Absolute: 0 10*3/uL (ref 0.0–0.1)
Basophils Relative: 0 %
Eosinophils Absolute: 0 10*3/uL (ref 0.0–0.5)
Eosinophils Relative: 0 %
HCT: 53.7 % — ABNORMAL HIGH (ref 39.0–52.0)
Hemoglobin: 15.1 g/dL (ref 13.0–17.0)
Immature Granulocytes: 0 %
Lymphocytes Relative: 29 %
Lymphs Abs: 1.6 10*3/uL (ref 0.7–4.0)
MCH: 26.5 pg (ref 26.0–34.0)
MCHC: 28.1 g/dL — ABNORMAL LOW (ref 30.0–36.0)
MCV: 94.2 fL (ref 80.0–100.0)
Monocytes Absolute: 0.5 10*3/uL (ref 0.1–1.0)
Monocytes Relative: 9 %
Neutro Abs: 3.3 10*3/uL (ref 1.7–7.7)
Neutrophils Relative %: 62 %
Platelets: 177 10*3/uL (ref 150–400)
RBC: 5.7 MIL/uL (ref 4.22–5.81)
RDW: 16.4 % — ABNORMAL HIGH (ref 11.5–15.5)
WBC: 5.4 10*3/uL (ref 4.0–10.5)
nRBC: 0 % (ref 0.0–0.2)

## 2021-11-17 LAB — GLUCOSE, CAPILLARY: Glucose-Capillary: 128 mg/dL — ABNORMAL HIGH (ref 70–99)

## 2021-11-17 LAB — ALPHA-1-ANTITRYPSIN: A-1 Antitrypsin, Ser: 191 mg/dL — ABNORMAL HIGH (ref 101–187)

## 2021-11-17 MED ORDER — FUROSEMIDE 10 MG/ML IJ SOLN
40.0000 mg | Freq: Four times a day (QID) | INTRAMUSCULAR | Status: DC
  Filled 2021-11-17: qty 4

## 2021-11-17 NOTE — Progress Notes (Signed)
Patient called out to RN and said that he wanted to sign out AMA. RN educated patient that with his work of breathing and not staying on Bipap his respiratory status could change for the worse. Pt stated " I don't give a shit". RN attempted again to educate patient the importance of receiving medical attention for his respiratory issues, he called family and told them to come get him now. RN paged MD, Dr. Wynelle Cleveland called back and on speaker phone with both RN and patient explained the risks he was taking with leaving against medical advice , Patient still declared he wanted to sign out AMA. Patient was given AMA papers to sign after the paper was read out loud to him and he voiced that he understood . Patient family member Butch Penny picked him up, he was once again explained the risks of leaving against medical advice, he still stated " I am leaving". Signed AMA paperwork placed in patients chart.

## 2021-11-17 NOTE — Plan of Care (Signed)
  Problem: Education: Goal: Ability to demonstrate management of disease process will improve Outcome: Progressing Goal: Ability to verbalize understanding of medication therapies will improve Outcome: Progressing   Problem: Cardiac: Goal: Ability to achieve and maintain adequate cardiopulmonary perfusion will improve Outcome: Progressing   

## 2021-11-17 NOTE — Plan of Care (Signed)
°  Problem: Education: Goal: Ability to verbalize understanding of medication therapies will improve Outcome: Progressing   Problem: Education: Goal: Ability to demonstrate management of disease process will improve Outcome: Not Progressing

## 2021-11-17 NOTE — Hospital Course (Signed)
This is a 56 year old male with hypertension, TBI, chronic pain, anxiety, tobacco abuse who presents with swelling of the abdomen arms and legs over the past month.  He was admitted on 1/26 through 1/27 for acute respiratory failure with hypoxia and hypercapnia noted.  He was placed on BiPAP and IV Lasix but left AGAINST MEDICAL ADVICE.  After getting home, his symptoms did not prove.  He was given doxycycline and urinary taper by his PCP but still did not improve.  In the ED, oxygen saturation 86% on 2 L.  On exam, noted to have abdominal distention and anasarca.  Started on IV diuresis.  Cardiology consulted.  Has been intermittently on BiPAP.

## 2021-11-17 NOTE — Progress Notes (Signed)
Critical PCO2 results received from the lab:  PCO2 = 104.  All ABG results reported to Dr. Kendrick Fries.

## 2021-11-17 NOTE — Progress Notes (Signed)
NAME:  Trevor Thomas, MRN:  [id number], DOB:  30-Apr-1966, LOS: 2 ADMISSION DATE:  11/15/2021, CONSULTATION DATE:  2/14 REFERRING MD:  Gerri Lins, CHIEF COMPLAINT:  Dyspnea   History of Present Illness:  56 y/o male cigarette smoker presented to [facility] with increasing abdominal girth, swelling, and confusion.  Noted to have severe hypoxemic respiratory failure.   Pertinent  Medical History  Cigarette smoker started smoking as a teenager, smoked as much as 1 pack/day, continues to smoke in 2023 History of GIB bleed History of alcohol abuse History of traumatic brain injury Hypertension Marijuana use Anxiety    Significant Hospital Events: Including procedures, antibiotic start and stop dates in addition to other pertinent events   2/13 admission  Studies: 2014 Spirometry: no airflow obstruction, suggestive of restriction 2/14 echocardiogram: LVEF 60 to 65%, RV function is moderately reduced, ventricular size is severely enlarged with RVSP 63.9 mmHg 2/14 HRCT chest > no clear interstitial lung disease, atelectasis vs consolidation lower lobes  Interim History / Subjective:  Only wore BIPAP for about 1 hour last night Tremulous, confused this morning Weight is down 2 kg  Objective   Blood pressure 119/80, pulse 90, temperature 98.5 F (36.9 C), resp. rate 14, height 5\' 11"  (1.803 m), weight 125.6 kg, SpO2 91 %.    FiO2 (%):  [36 %] 36 %   Intake/Output Summary (Last 24 hours) at 11/17/2021 1309 Last data filed at 11/17/2021 1124 Gross per 24 hour  Intake 723 ml  Output 1100 ml  Net -377 ml   Filed Weights   11/16/21 0037 11/16/21 0500 11/17/21 0515  Weight: 127.6 kg 127.6 kg 125.6 kg    Examination:  General:  Obese male, tremulous, sitting up on side of bed, eating breakfast then later vomiting HENT: NCAT OP clear PULM: Wheezes bases B, normal effort CV: didn't examine: vomiting GI: didn't examine: vomiting MSK: normal bulk and tone Neuro: awake, answers questions  appropriately, MAEW   Resolved Hospital Problem list     Assessment & Plan:  Acute on chronic hypercarbic respiratory failure: presumably obesity hypoventilation syndrome; concern for underlying airflow obstruction;  HRCT doesn't show evidence of emphysema or ILD Doesn't wear BIPAP much. Encouraged him to wear BIPAP while napping and sleeping Continue aggressive diuresis Monitor carefully, may need to move to ICU level care if his mental status doesn't improve today Repeat ABG Will need outpatient PFT/sleep study  Cor pulmonale BIPAP Lasix RHC after 1-2 more days of diuresis  Cigarette smoker Counsel to quit    Best Practice (right click and "Reselect all SmartList Selections" daily)   Per [facility]  Labs   CBC: Recent Labs  Lab 11/15/21 1020 11/15/21 1403 11/17/21 0121  WBC 5.6  --  5.4  NEUTROABS 3.4  --  3.3  HGB 15.7 18.4* 15.1  HCT 55.1* 54.0* 53.7*  MCV 92.4  --  94.2  PLT 205  --  177    Basic Metabolic Panel: Recent Labs  Lab 11/15/21 1020 11/15/21 1403 11/15/21 1846 11/17/21 0121  NA 136 133* 137 138  K 3.8 7.4* 3.8 4.9  CL 90*  --  90* 85*  CO2 38*  --  40* >45*  GLUCOSE 109*  --  83 118*  BUN 6  --  5* 9  CREATININE 0.73  --  0.70 0.68  CALCIUM 8.8*  --  8.7* 9.0   GFR: Estimated Creatinine Clearance: 140.8 mL/min (by C-G formula based on SCr of 0.68 mg/dL). Recent Labs  Lab 11/15/21 1020  11/17/21 0121  WBC 5.6 5.4    Liver Function Tests: Recent Labs  Lab 11/15/21 1020  AST 19  ALT 10  ALKPHOS 64  BILITOT 0.5  PROT 5.7*  ALBUMIN 2.9*   Recent Labs  Lab 11/15/21 1020  LIPASE 21   Recent Labs  Lab 11/15/21 1848  AMMONIA 59*    ABG    Component Value Date/Time   HCO3 41.6 (H) 11/15/2021 1848   TCO2 44 (H) 11/15/2021 1403   O2SAT 89.1 11/15/2021 1848     Coagulation Profile: Recent Labs  Lab 11/15/21 1353  INR 1.2    Cardiac Enzymes: No results for input(s): CKTOTAL, CKMB, CKMBINDEX, TROPONINI in the last  168 hours.  HbA1C: Hgb A1c MFr Bld  Date/Time Value Ref Range Status  06/16/2016 06:05 PM 5.4 4.8 - 5.6 % Final    Comment:    (NOTE)         Pre-diabetes: 5.7 - 6.4         Diabetes: >6.4         Glycemic control for adults with diabetes: <7.0   10/26/2010 10:39 AM 5.4 %     CBG: Recent Labs  Lab 11/17/21 0605  GLUCAP 128*       Critical care time: n/a    Heber [place], MD [place] [facility] Pager: [phone] Cell: [phone] After 7:00 pm call Elink  [phone]

## 2021-11-17 NOTE — Progress Notes (Addendum)
Progress Note  Patient Name: Trevor Thomas Date of Encounter: 11/17/2021  Primary Cardiologist: None   Subjective   Patient is seen at bedside. He is tremulous and dyspneic.   Inpatient Medications    Scheduled Meds:  amLODipine  10 mg Oral QHS   And   benazepril  40 mg Oral QHS   arformoterol  15 mcg Nebulization BID   budesonide (PULMICORT) nebulizer solution  0.5 mg Nebulization BID   buprenorphine-naloxone  2 tablet Sublingual QID   enoxaparin (LOVENOX) injection  40 mg Subcutaneous Q24H   furosemide  40 mg Intravenous BID   loratadine  10 mg Oral QHS   predniSONE  40 mg Oral Q breakfast   sodium chloride flush  3 mL Intravenous Q12H   Continuous Infusions:  PRN Meds: acetaminophen **OR** acetaminophen, albuterol, nicotine, ondansetron **OR** ondansetron (ZOFRAN) IV   Vital Signs    Vitals:   11/17/21 0020 11/17/21 0402 11/17/21 0515 11/17/21 0736  BP:  108/72  102/66  Pulse: 94 95  96  Resp: (!) Temp:  98.1 F (36.7 C)  98.6 F (37 C)  TempSrc:  Oral    SpO2: 93% 90%  91%  Weight:   125.6 kg   Height:        Intake/Output Summary (Last 24 hours) at 11/17/2021 0752 Last data filed at 11/17/2021 0000 Gross per 24 hour  Intake 960 ml  Output 1000 ml  Net -40 ml   Filed Weights   11/16/21 0037 11/16/21 0500 11/17/21 0515  Weight: 127.6 kg 127.6 kg 125.6 kg    Telemetry    Sinus tach. Multiple readings of desat - Personally Reviewed  ECG      2/13 normal sinus rhythm, right axis deviation- Personally Reviewed  Physical Exam   GEN: acute res distress, ill appearing, actively vomiting  Cardiac: tachycardia, no murmurs, rubs, or gallops.  Respiratory: no wheezing MS: +2 edema bilat LE Neuro:  Nonfocal  Psych: Normal affect   Labs    Chemistry Recent Labs  Lab 11/15/21 1020 11/15/21 1403 11/15/21 1846 11/17/21 0121  NA 136 133* 137 138  K 3.8 7.4* 3.8 4.9  CL 90*  --  90* 85*  CO2 38*  --  40* >45*  GLUCOSE 109*  --  83  118*  BUN 6  --  5* 9  CREATININE 0.73  --  0.70 0.68  CALCIUM 8.8*  --  8.7* 9.0  PROT 5.7*  --   --   --   ALBUMIN 2.9*  --   --   --   AST 19  --   --   --   ALT 10  --   --   --   ALKPHOS 64  --   --   --   BILITOT 0.5  --   --   --   GFRNONAA >60  --  >60 >60  ANIONGAP 8  --  7 NOT CALCULATED     Hematology Recent Labs  Lab 11/15/21 1020 11/15/21 1403 11/17/21 0121  WBC 5.6  --  5.4  RBC 5.96*  --  5.70  HGB 15.7 18.4* 15.1  HCT 55.1* 54.0* 53.7*  MCV 92.4  --  94.2  MCH 26.3  --  26.5  MCHC 28.5*  --  28.1*  RDW 16.6*  --  16.4*  PLT 205  --  177    Cardiac EnzymesNo results for input(s): TROPONINI in the last 168 hours.  No results for input(s): TROPIPOC in the last 168 hours.   BNP Recent Labs  Lab 11/15/21 1020  BNP 284.4*     DDimer No results for input(s): DDIMER in the last 168 hours.   Radiology    DG Chest 2 View  Result Date: 11/15/2021 CLINICAL DATA:  Cough, swelling, and shortness of breath.  COPD. EXAM: CHEST - 2 VIEW COMPARISON:  10/28/2021 FINDINGS: Mild cardiomegaly. Indistinct interstitial accentuation at the lung bases, with some confluent linear opacities at the right lung base suggesting superimposed atelectasis. No overt cephalization of blood flow or Kerley B lines. IMPRESSION: 1. Mild enlargement of the cardiopericardial silhouette. Accentuated interstitium at the lung basis, nonspecific for early interstitial edema versus atypical pneumonia. 2. Suspected mild atelectasis in the right lower lobe. Electronically Signed   By: Gaylyn Rong M.D.   On: 11/15/2021 10:52   CT ABDOMEN PELVIS W CONTRAST  Result Date: 11/15/2021 CLINICAL DATA:  Acute nonlocalized abdominal pain, shortness of breath, cough, abdominal swelling EXAM: CT ABDOMEN AND PELVIS WITH CONTRAST TECHNIQUE: Multidetector CT imaging of the abdomen and pelvis was performed using the standard protocol following bolus administration of intravenous contrast. RADIATION DOSE  REDUCTION: This exam was performed according to the departmental dose-optimization program which includes automated exposure control, adjustment of the mA and/or kV according to patient size and/or use of iterative reconstruction technique. CONTRAST:  OMNIPAQUE IOHEXOL 300 MG/ML  SOLN COMPARISON:  10/28/2021 FINDINGS: Lower chest: Mild cardiomegaly with trace pericardial fluid. Very small right pleural effusion with bibasilar atelectasis noted. Degenerative changes noted of the thoracic spine. Lower thoracic subcutaneous strandy body wall edema noted compatible with anasarca. Hepatobiliary: No large focal hepatic abnormality or biliary dilatation pattern. Focal fatty infiltration of the liver along the falciform ligament anteriorly. Patent portal vein. Gallbladder nondistended. Query noncalcified gallstones. Common bile duct nondilated. Small amount of perihepatic free fluid. Pancreas: Unremarkable. No pancreatic ductal dilatation or surrounding inflammatory changes. Spleen: Normal in size without focal abnormality. Adrenals/Urinary Tract: Adrenal glands are unremarkable. Kidneys are normal, without renal calculi, focal lesion, or hydronephrosis. Bladder is unremarkable. Stomach/Bowel: Negative for bowel obstruction, significant dilatation, ileus, or free air. Normal appearing appendix. No fluid collection, hemorrhage, hematoma, or abscess. Vascular/Lymphatic: Intact aorta. Aortoiliac atherosclerosis noted with tortuosity but no occlusive process. Mesenteric and renal vasculature all remain patent. No veno-occlusive finding or process. Negative for adenopathy. Reproductive: No significant finding by CT. Other: Intact abdominal wall. No hernia. Small amount of abdominopelvic ascites. Mild nonspecific ill-defined retroperitoneal strandy edema along the psoas muscles and also within the presacral space. Increased diffuse abdominal wall and pelvic subcutaneous edema compatible with anasarca. Musculoskeletal: Left  hip arthroplasty changes noted. Degenerative changes throughout the spine. Lower lumbar facet arthropathy. No acute osseous finding. IMPRESSION: No acute intra-abdominal or pelvic finding by CT. Evidence of increased fluid retention with worsening anasarca, small volume of abdominopelvic ascites, and nonspecific retroperitoneal abdominopelvic free fluid/edema. Mild cardiomegaly and trace pericardial effusion. Small right pleural effusion and bibasilar atelectasis. Query noncalcified gallstones Aortic Atherosclerosis (ICD10-I70.0). Electronically Signed   By: Judie Petit.  Shick M.D.   On: 11/15/2021 12:34   ECHOCARDIOGRAM COMPLETE  Result Date: 11/16/2021    ECHOCARDIOGRAM REPORT   Patient Name:   HATCHER FRONING Date of Exam: 11/16/2021 Medical Rec #:  [id number]     Height:       71.0 in Accession #:    [id number]    Weight:       281.3 lb Date of Birth:  1966-04-11  BSA:          2.438 m Patient Age:    55 years      BP:           120/80 mmHg Patient Gender: M             HR:           95 bpm. Exam Location:  Inpatient Procedure: 2D Echo, Cardiac Doppler and Color Doppler Indications:    CHF  History:        Patient has no prior history of Echocardiogram examinations.                 COPD, Signs/Symptoms:Shortness of Breath; Risk                 Factors:Hypertension.  Sonographer:    Ross Ludwig RDCS (AE) Referring Phys: [id number] RONDELL A SMITH IMPRESSIONS  1. Left ventricular ejection fraction, by estimation, is 60 to 65%. The left ventricle has normal function. The left ventricle has no regional wall motion abnormalities. Indeterminate diastolic filling due to E-A fusion. There is the interventricular septum is flattened in systole and diastole, consistent with right ventricular pressure and volume overload.  2. Right ventricular systolic function is moderately reduced. The right ventricular size is severely enlarged. There is severely elevated pulmonary artery systolic pressure. The estimated right ventricular  systolic pressure is 62.9 mmHg.  3. Right atrial size was mildly dilated.  4. A small pericardial effusion is present. The pericardial effusion is posterior to the left ventricle.  5. The mitral valve is grossly normal. No evidence of mitral valve regurgitation. No evidence of mitral stenosis.  6. Focal calcification of the LCC. The aortic valve is tricuspid. There is mild calcification of the aortic valve. Aortic valve regurgitation is not visualized. No aortic stenosis is present.  7. There is mild dilatation of the ascending aorta, measuring 41 mm.  8. The inferior vena cava is dilated in size with <50% respiratory variability, suggesting right atrial pressure of 15 mmHg. Conclusion(s)/Recommendation(s): Findings consistent with Cor Pulmonale. FINDINGS  Left Ventricle: Left ventricular ejection fraction, by estimation, is 60 to 65%. The left ventricle has normal function. The left ventricle has no regional wall motion abnormalities. The left ventricular internal cavity size was normal in size. There is  no left ventricular hypertrophy. The interventricular septum is flattened in systole and diastole, consistent with right ventricular pressure and volume overload. Indeterminate diastolic filling due to E-A fusion. Right Ventricle: The right ventricular size is severely enlarged. No increase in right ventricular wall thickness. Right ventricular systolic function is moderately reduced. There is severely elevated pulmonary artery systolic pressure. The tricuspid regurgitant velocity is 3.46 m/s, and with an assumed right atrial pressure of 15 mmHg, the estimated right ventricular systolic pressure is 62.9 mmHg. Left Atrium: Left atrial size was normal in size. Right Atrium: Right atrial size was mildly dilated. Pericardium: A small pericardial effusion is present. The pericardial effusion is posterior to the left ventricle. Mitral Valve: The mitral valve is grossly normal. No evidence of mitral valve regurgitation.  No evidence of mitral valve stenosis. MV peak gradient, 6.4 mmHg. The mean mitral valve gradient is 3.0 mmHg. Tricuspid Valve: The tricuspid valve is grossly normal. Tricuspid valve regurgitation is mild . No evidence of tricuspid stenosis. Aortic Valve: Focal calcification of the LCC. The aortic valve is tricuspid. There is mild calcification of the aortic valve. Aortic valve regurgitation is not visualized. No aortic stenosis is present. Aortic  valve mean gradient measures 5.0 mmHg. Aortic valve peak gradient measures 10.1 mmHg. Aortic valve area, by VTI measures 3.18 cm. Pulmonic Valve: The pulmonic valve was grossly normal. Pulmonic valve regurgitation is not visualized. No evidence of pulmonic stenosis. Aorta: The aortic root is normal in size and structure. There is mild dilatation of the ascending aorta, measuring 41 mm. Venous: The inferior vena cava is dilated in size with less than 50% respiratory variability, suggesting right atrial pressure of 15 mmHg. IAS/Shunts: The atrial septum is grossly normal.  LEFT VENTRICLE PLAX 2D LVIDd:         4.30 cm   Diastology LVIDs:         2.70 cm   LV e' medial:    9.75 cm/s LV PW:         1.60 cm   LV E/e' medial:  10.9 LV IVS:        1.50 cm   LV e' lateral:   13.10 cm/s LVOT diam:     2.00 cm   LV E/e' lateral: 8.1 LV SV:         80 LV SV Index:   33 LVOT Area:     3.14 cm  RIGHT VENTRICLE             IVC RV Basal diam:  5.60 cm     IVC diam: 3.00 cm RV Mid diam:    4.80 cm RV S prime:     19.60 cm/s TAPSE (M-mode): 1.9 cm LEFT ATRIUM             Index        RIGHT ATRIUM           Index LA diam:        4.40 cm 1.81 cm/m   RA Area:     25.20 cm LA Vol (A2C):   77.5 ml 31.79 ml/m  RA Volume:   79.70 ml  32.70 ml/m LA Vol (A4C):   70.3 ml 28.84 ml/m LA Biplane Vol: 73.6 ml 30.19 ml/m  AORTIC VALVE AV Area (Vmax):    3.20 cm AV Area (Vmean):   3.23 cm AV Area (VTI):     3.18 cm AV Vmax:           159.00 cm/s AV Vmean:          105.000 cm/s AV VTI:             0.253 m AV Peak Grad:      10.1 mmHg AV Mean Grad:      5.0 mmHg LVOT Vmax:         162.00 cm/s LVOT Vmean:        108.000 cm/s LVOT VTI:          0.256 m LVOT/AV VTI ratio: 1.01  AORTA Ao Root diam: 3.60 cm Ao Asc diam:  4.10 cm MITRAL VALVE                TRICUSPID VALVE MV Area (PHT): 4.10 cm     TR Peak grad:   47.9 mmHg MV Area VTI:   3.24 cm     TR Vmax:        346.00 cm/s MV Peak grad:  6.4 mmHg MV Mean grad:  3.0 mmHg     SHUNTS MV Vmax:       1.26 m/s     Systemic VTI:  0.26 m MV Vmean:      78.3 cm/s  Systemic Diam: 2.00 cm MV Decel Time: 185 msec MV E velocity: 106.00 cm/s MV A velocity: 111.00 cm/s MV E/A ratio:  0.95 Lennie Odor MD Electronically signed by Lennie Odor MD Signature Date/Time: 11/16/2021/4:57:32 PM    Final     Cardiac Studies   ECHOCARDIOGRAM COMPLETE  Result Date: 11/16/2021    ECHOCARDIOGRAM REPORT   Patient Name:   Trevor Thomas Date of Exam: 11/16/2021 Medical Rec #:  [id number]     Height:       71.0 in Accession #:    [id number]    Weight:       281.3 lb Date of Birth:  July 16, 1966      BSA:          2.438 m Patient Age:    55 years      BP:           120/80 mmHg Patient Gender: M             HR:           95 bpm. Exam Location:  Inpatient Procedure: 2D Echo, Cardiac Doppler and Color Doppler Indications:    CHF  History:        Patient has no prior history of Echocardiogram examinations.                 COPD, Signs/Symptoms:Shortness of Breath; Risk                 Factors:Hypertension.  Sonographer:    Ross Ludwig RDCS (AE) Referring Phys: [id number] RONDELL A SMITH IMPRESSIONS  1. Left ventricular ejection fraction, by estimation, is 60 to 65%. The left ventricle has normal function. The left ventricle has no regional wall motion abnormalities. Indeterminate diastolic filling due to E-A fusion. There is the interventricular septum is flattened in systole and diastole, consistent with right ventricular pressure and volume overload.  2. Right ventricular systolic function is  moderately reduced. The right ventricular size is severely enlarged. There is severely elevated pulmonary artery systolic pressure. The estimated right ventricular systolic pressure is 62.9 mmHg.  3. Right atrial size was mildly dilated.  4. A small pericardial effusion is present. The pericardial effusion is posterior to the left ventricle.  5. The mitral valve is grossly normal. No evidence of mitral valve regurgitation. No evidence of mitral stenosis.  6. Focal calcification of the LCC. The aortic valve is tricuspid. There is mild calcification of the aortic valve. Aortic valve regurgitation is not visualized. No aortic stenosis is present.  7. There is mild dilatation of the ascending aorta, measuring 41 mm.  8. The inferior vena cava is dilated in size with <50% respiratory variability, suggesting right atrial pressure of 15 mmHg. Conclusion(s)/Recommendation(s): Findings consistent with Cor Pulmonale.   Patient Profile     56 y.o. 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  Assessment & Plan    Acute hypoxic respiratory failure Anasarca Acute on chronic heart failure Echocardiogram showed preserved ejection fraction with LVH.  There is severely dilated right ventricle with significantly high pulmonary arterial pressure.  The underlying cause is cor pulmonale secondary to COPD and likely OSA as well.  His LVH slightly secondary to chronic hypertension. Put on 1L overnight. Wt down from 281 to 176.  Will be cautious with diuresis given his  preload dependent heart failure. -Holding Benazepril  -Continue Lasix 40 mg BID   COPD exacerbation Bicarb continue to trend up.  He only wore Bipap for about 1 hour last night. Given his actively emesis, will hold off on bipap this AM and try again  later. -Pulmonology on board -Pending CT chest -Nebulizer and prednisone per primary team.   -Recommend repeat PFT after discharge   Hypertension Holding benazepril   Chronic pain -Continue Suboxone per primary team   For questions or updates, please contact [facility] Please consult [email or web address] for contact info under Cardiology/STEMI.      Signed, Doran Stabler, DO  11/17/2021, 7:52 AM     I have seen and examined the patient along with Doran Stabler, DO.  I have reviewed the chart, notes and new data.  I agree with his note.  Key new complaints: weak, prominent asterixis, started actively vomiting Key examination changes: asterixis, mild cyanosis, persistent 3+ hard pitting edema, bilateral wheezing, poor air exchange. BP much lower, still normal. Key new findings / data: sinus rhythm; despite diuresis, creatinine remains normal. Severe elevation in venous bicarb.  PLAN: Continue diuresis. Hold ACEi, try to continue the calcium channel blocker. Continue pulmonary disease workup, needs BiPAP as much as he can tolerate, after we make sure he will not continue vomiting. Severe cor pulmonale, with secondary contribution of diastolic left heart failure. Once he appears closer to euvolemia, plan right heart catheterization.  Thurmon Fair, MD, [facility] [phone] 11/17/2021, 9:15 AM

## 2021-11-17 NOTE — Progress Notes (Signed)
[facility] Progress Note  Patient: Trevor Thomas     VEL:[id number]  DOA: 11/15/2021      Date of Service: the patient was seen and examined on 11/17/2021  Brief hospital course: This is a 56 year old male with hypertension, TBI, chronic pain, anxiety, tobacco abuse who presents with swelling of the abdomen arms and legs over the past month.  He was admitted on 1/26 through 1/27 for acute respiratory failure with hypoxia and hypercapnia noted.  He was placed on BiPAP and IV Lasix but left AGAINST MEDICAL ADVICE.  After getting home, his symptoms did not prove.  He was given doxycycline and urinary taper by his PCP but still did not improve.  In the ED, oxygen saturation 86% on 2 L.  On exam, noted to have abdominal distention and anasarca.  Started on IV diuresis.  Cardiology consulted.  Has been intermittently on BiPAP.  Subjective:  He states that he is jumpy because his carbon monoxide level is too high.  He is able to follow commands and sits up in the bed for me to allow exam.  Assessment and Plan: Assessment and Plan: * Acute respiratory failure with hypoxia and hypercapnia (HCC)- (present on admission) - Continue diuresis with IV Lasix and BiPAP - 2D echo reveals an EF of 60 to 65%, severely elevated pulmonary artery systolic pressure and RV pressure and volume overload with a small pericardial effusion-findings consistent with cor pulmonale - PCCM also consulted for suspected COPD and high-resolution CT obtained but images were degraded by motion artifact >  collapse/consolidation in the lung bases, bibasilar atelectasis and or pneumonia-recommend CT follow-up in 3 to 4 weeks to ensure no centrally obstructing mass -The patient is currently receiving Pulmicort twice daily, Brovana twice daily and prednisone 40 mg daily  Acute cor pulmonale (HCC)- (present on admission) - with anasarca- see above  Chronic pain Patient has history of chronic back pain.  Currently  onbuprenorphine HCL-Naloxone 4- 1 mg 4 times daily. -Continue pharmacy substitution with Suboxone as tolerated  Essential hypertension- (present on admission) On admission blood pressures anywhere from 119/82 to 150/91.  Home blood pressure regimen includes amlodipine-benazepril 10-40 mg daily. -Continue amlodipine 10 mg daily  Tobacco abuse- (present on admission) Continue to counsel on the cessation of tobacco use.  Reports over 20 smoking pack-year history of tobacco abuse. -Continue counseling on the cessation of tobacco use -Nicotine patch as needed      DVT prophylaxis: enoxaparin (LOVENOX) injection 40 mg Start: 11/15/21 1400   Code Status:   Code Status: Full Code  Level of Care: Level of care: Telemetry Cardiac Disposition Plan:  Status is: Inpatient Remains inpatient appropriate because: For BiPAP and IV diuresis  Objective:   Vitals:   11/17/21 0515 11/17/21 0736 11/17/21 0809 11/17/21 1124  BP:  102/66  119/80  Pulse:  96 (!) 102 90  Resp:  14 18 14   Temp:  98.6 F (37 C)  98.5 F (36.9 C)  TempSrc:  Oral  Oral  SpO2:  91% 92% 91%  Weight: 125.6 kg     Height:       Filed Weights   11/16/21 0037 11/16/21 0500 11/17/21 0515  Weight: 127.6 kg 127.6 kg 125.6 kg    Exam: General exam: Appears comfortable  HEENT: PERRLA, oral mucosa moist, no sclera icterus or thrush Respiratory system: Crackles at bilateral bases  cardiovascular system: S1 & S2 heard, regular rate and rhythm Gastrointestinal system: Abdomen soft, non-tender, moderately distended. Normal bowel sounds  Central nervous system: Alert and oriented.  Mild generalized jerking movements Extremities: No cyanosis, clubbing -bilateral upper and lower extremity edema Skin: No rashes or ulcers   Imaging and lab data Reviewed    CBC: Recent Labs  Lab 11/15/21 1020 11/15/21 1403 11/17/21 0121  WBC 5.6  --  5.4  NEUTROABS 3.4  --  3.3  HGB 15.7 18.4* 15.1  HCT 55.1* 54.0* 53.7*  MCV 92.4   --  94.2  PLT 205  --  177   Basic Metabolic Panel: Recent Labs  Lab 11/15/21 1020 11/15/21 1403 11/15/21 1846 11/17/21 0121  NA 136 133* 137 138  K 3.8 7.4* 3.8 4.9  CL 90*  --  90* 85*  CO2 38*  --  40* >45*  GLUCOSE 109*  --  83 118*  BUN 6  --  5* 9  CREATININE 0.73  --  0.70 0.68  CALCIUM 8.8*  --  8.7* 9.0   GFR: Estimated Creatinine Clearance: 140.8 mL/min (by C-G formula based on SCr of 0.68 mg/dL).    Author: Calvert Cantor  11/17/2021 2:52 PM

## 2021-11-18 NOTE — Discharge Summary (Signed)
Physician Discharge Summary  Trevor Thomas [id number] DOB: 1966-02-03 DOA: 11/15/2021  [facility]P: [facility]  Admit date: 11/15/2021 Discharge date: 11/17/2021  [place] ADVICE   Discharge Diagnoses:  Principal Problem:   Acute respiratory failure with hypoxia and hypercapnia (HCC) Active Problems:   Acute cor pulmonale (HCC)   Chronic pain   Essential hypertension   Tobacco abuse    SEE PROGRESS NOTE FROM 2/15   Discharge Exam: Vitals:   11/17/21 1124 11/17/21 1622  BP: 119/80 120/77  Pulse: 90 99  Resp: 14 17  Temp: 98.5 F (36.9 C)   SpO2: 91%    Vitals:   11/17/21 0736 11/17/21 0809 11/17/21 1124 11/17/21 1622  BP: 102/66  119/80 120/77  Pulse: 96 (!) 102 90 99  Resp: 14 18 14 17   Temp: 98.6 F (37 C)  98.5 F (36.9 C)   TempSrc: Oral  Oral   SpO2: 91% 92% 91%   Weight:      Height:         Discharge Instructions   Allergies as of 11/17/2021       Reactions   Bee Venom Anaphylaxis   Clonidine Derivatives Other (See Comments)   Stomach pain        Medication List     ASK your doctor about these medications    acetaminophen 500 MG tablet Commonly known as: TYLENOL Take 500 mg by mouth at bedtime. Ask about: Which instructions should I use?   albuterol 108 (90 Base) MCG/ACT inhaler Commonly known as: VENTOLIN HFA Inhale 2 puffs into the lungs every 6 (six) hours as needed for wheezing.   amLODipine-benazepril 10-40 MG capsule Commonly known as: LOTREL Take 1 capsule by mouth at bedtime.   Buprenorphine HCl-Naloxone HCl 4-1 MG Film Place 1 Film under the tongue 4 (four) times daily. Ask about: Which instructions should I use?   doxycycline 100 MG capsule Commonly known as: MONODOX Take 100 mg by mouth 2 (two) times daily.   loratadine 10 MG tablet Commonly known as: CLARITIN Take 10 mg by mouth at bedtime.        Allergies  Allergen Reactions   Bee Venom Anaphylaxis   Clonidine Derivatives  Other (See Comments)    Stomach pain      CT ABDOMEN PELVIS W WO CONTRAST  Result Date: 10/28/2021 CLINICAL DATA:  Ascites EXAM: CT ABDOMEN AND PELVIS WITHOUT AND WITH CONTRAST TECHNIQUE: Multidetector CT imaging of the abdomen and pelvis was performed following the standard protocol before and following the bolus administration of intravenous contrast. RADIATION DOSE REDUCTION: This exam was performed according to the departmental dose-optimization program which includes automated exposure control, adjustment of the mA and/or kV according to patient size and/or use of iterative reconstruction technique. CONTRAST:  184mL OMNIPAQUE IOHEXOL 300 MG/ML  SOLN COMPARISON:  CT abdomen and pelvis 10/20/2014 FINDINGS: Lower chest: Cardiomegaly and small pericardial effusion. Irregular densities in the bilateral lung bases/lower lobes, right-greater-than-left. Hepatobiliary: Liver is normal in size with normal contour. Hypodense parenchyma suggesting hepatic steatosis. Chronic faint hypodensities in the right hepatic lobe centrally and near the falciform ligament, likely represent focal fatty infiltration. Gallbladder wall thickening and mild surrounding fat stranding. No biliary ductal dilatation identified. Pancreas: Mildly atrophic with no suspicious mass or ductal dilatation identified. Spleen: Normal in size without focal abnormality. Adrenals/Urinary Tract: Adrenal glands appear normal. Kidneys appear normal. Urinary bladder is significantly distended with no suspicious mass or wall thickening identified. Stomach/Bowel: No bowel obstruction, free  air or pneumatosis. No bowel wall edema identified. Appendix is normal. Vascular/Lymphatic: Aortic atherosclerosis. No enlarged abdominal or pelvic lymph nodes. Reproductive: Prostate is unremarkable. Other: No ascites. Presacral fat stranding edema. Diffuse subcutaneous soft tissue edema. Musculoskeletal: No suspicious bony lesions identified. IMPRESSION: 1. No ascites  visualized. 2. Gallbladder wall thickening and mild surrounding fat stranding. Correlate clinically and consider follow-up ultrasound if indicated. 3. Cardiomegaly and small pericardial effusion. 4. Irregular densities in the bilateral lung bases right greater than left which likely represent atelectatic changes, or possibly infiltrate. 5. Diffuse subcutaneous soft tissue edema suggesting anasarca. 6. Significantly distended gallbladder without wall thickening, correlate clinically. Electronically Signed   By: Ofilia Neas M.D.   On: 10/28/2021 15:56   DG Chest 2 View  Result Date: 11/15/2021 CLINICAL DATA:  Cough, swelling, and shortness of breath.  COPD. EXAM: CHEST - 2 VIEW COMPARISON:  10/28/2021 FINDINGS: Mild cardiomegaly. Indistinct interstitial accentuation at the lung bases, with some confluent linear opacities at the right lung base suggesting superimposed atelectasis. No overt cephalization of blood flow or Kerley B lines. IMPRESSION: 1. Mild enlargement of the cardiopericardial silhouette. Accentuated interstitium at the lung basis, nonspecific for early interstitial edema versus atypical pneumonia. 2. Suspected mild atelectasis in the right lower lobe. Electronically Signed   By: Van Clines M.D.   On: 11/15/2021 10:52   DG Chest 2 View  Result Date: 10/28/2021 CLINICAL DATA:  Shortness of breath and lower extremity swelling for 2 weeks. EXAM: CHEST - 2 VIEW COMPARISON:  10/19/2021 and 06/29/2021 FINDINGS: Stable borderline cardiomegaly given portable technique. No evidence of pulmonary infiltrate or edema. No evidence of pleural effusion. IMPRESSION: No active disease. Electronically Signed   By: Marlaine Hind M.D.   On: 10/28/2021 10:30   CT ABDOMEN PELVIS W CONTRAST  Result Date: 11/15/2021 CLINICAL DATA:  Acute nonlocalized abdominal pain, shortness of breath, cough, abdominal swelling EXAM: CT ABDOMEN AND PELVIS WITH CONTRAST TECHNIQUE: Multidetector CT imaging of the abdomen  and pelvis was performed using the standard protocol following bolus administration of intravenous contrast. RADIATION DOSE REDUCTION: This exam was performed according to the departmental dose-optimization program which includes automated exposure control, adjustment of the mA and/or kV according to patient size and/or use of iterative reconstruction technique. CONTRAST:  169mL OMNIPAQUE IOHEXOL 300 MG/ML  SOLN COMPARISON:  10/28/2021 FINDINGS: Lower chest: Mild cardiomegaly with trace pericardial fluid. Very small right pleural effusion with bibasilar atelectasis noted. Degenerative changes noted of the thoracic spine. Lower thoracic subcutaneous strandy body wall edema noted compatible with anasarca. Hepatobiliary: No large focal hepatic abnormality or biliary dilatation pattern. Focal fatty infiltration of the liver along the falciform ligament anteriorly. Patent portal vein. Gallbladder nondistended. Query noncalcified gallstones. Common bile duct nondilated. Small amount of perihepatic free fluid. Pancreas: Unremarkable. No pancreatic ductal dilatation or surrounding inflammatory changes. Spleen: Normal in size without focal abnormality. Adrenals/Urinary Tract: Adrenal glands are unremarkable. Kidneys are normal, without renal calculi, focal lesion, or hydronephrosis. Bladder is unremarkable. Stomach/Bowel: Negative for bowel obstruction, significant dilatation, ileus, or free air. Normal appearing appendix. No fluid collection, hemorrhage, hematoma, or abscess. Vascular/Lymphatic: Intact aorta. Aortoiliac atherosclerosis noted with tortuosity but no occlusive process. Mesenteric and renal vasculature all remain patent. No veno-occlusive finding or process. Negative for adenopathy. Reproductive: No significant finding by CT. Other: Intact abdominal wall. No hernia. Small amount of abdominopelvic ascites. Mild nonspecific ill-defined retroperitoneal strandy edema along the psoas muscles and also within the  presacral space. Increased diffuse abdominal wall and pelvic subcutaneous edema  compatible with anasarca. Musculoskeletal: Left hip arthroplasty changes noted. Degenerative changes throughout the spine. Lower lumbar facet arthropathy. No acute osseous finding. IMPRESSION: No acute intra-abdominal or pelvic finding by CT. Evidence of increased fluid retention with worsening anasarca, small volume of abdominopelvic ascites, and nonspecific retroperitoneal abdominopelvic free fluid/edema. Mild cardiomegaly and trace pericardial effusion. Small right pleural effusion and bibasilar atelectasis. Query noncalcified gallstones Aortic Atherosclerosis (ICD10-I70.0). Electronically Signed   By: Jerilynn Mages.  Shick M.D.   On: 11/15/2021 12:34   CT Chest High Resolution  Result Date: 11/17/2021 CLINICAL DATA:  Shortness of breath on exertion. EXAM: CT CHEST WITHOUT CONTRAST TECHNIQUE: Multidetector CT imaging of the chest was performed following the standard protocol without intravenous contrast. High resolution imaging of the lungs, as well as inspiratory and expiratory imaging, was performed. RADIATION DOSE REDUCTION: This exam was performed according to the departmental dose-optimization program which includes automated exposure control, adjustment of the mA and/or kV according to patient size and/or use of iterative reconstruction technique. COMPARISON:  04/17/2008. FINDINGS: Cardiovascular: Atherosclerotic calcification of the aorta, aortic valve and coronary arteries. Pulmonic trunk and heart are enlarged. Small pericardial effusion. Ascending aorta measures up to 4.4 cm, previously 3.7 cm. Mediastinum/Nodes: Mediastinal lymph nodes are not enlarged by CT size criteria. Hilar regions are difficult to definitively evaluate without IV contrast. No axillary adenopathy. Esophagus is grossly unremarkable. Lungs/Pleura: Image quality is degraded by expiratory phase imaging and respiratory motion. Collapse/consolidation in both lower  lobes with minimal involvement of the right middle lobe and lingula. Mild left infrahilar fullness (6/79). Assessment for interstitial lung disease is therefore challenging but the aerated portions of the lungs show no abnormalities suspicious for fibrotic lung disease. Tiny right pleural effusion. Airway is otherwise unremarkable. Upper Abdomen: Visualized portions of the liver, gallbladder, adrenal glands, kidneys, spleen, pancreas, stomach and bowel are grossly unremarkable. Musculoskeletal: Degenerative changes in the spine. No worrisome lytic or sclerotic lesions. IMPRESSION: 1. Assessment for interstitial lung disease is limited by expiratory phase imaging, respiratory motion and collapse/consolidation in the lung bases. 2. Bibasilar atelectasis and/or pneumonia. Left infrahilar fullness is likely related. Recommend follow-up CT chest with contrast in 3-4 weeks to ensure clearing, as a centrally obstructing mass is difficult to definitively exclude. 3. Small pericardial effusion. 4. Tiny right pleural effusion. 5. Ascending aortic aneurysm. Recommend annual imaging followup by CTA or MRA. This recommendation follows 2010 ACCF/AHA/AATS/ACR/ASA/SCA/SCAI/SIR/STS/SVM Guidelines for the Diagnosis and Management of Patients with Thoracic Aortic Disease. Circulation. 2010; [id number]. Aortic aneurysm NOS (ICD10-I71.9). 6. Aortic atherosclerosis (ICD10-I70.0). Coronary artery calcification. 7. Enlarged pulmonic trunk, indicative of pulmonary arterial hypertension. Electronically Signed   By: Lorin Picket M.D.   On: 11/17/2021 08:14   DG Chest Port 1 View  Result Date: 10/19/2021 CLINICAL DATA:  Cough with shortness of breath. EXAM: PORTABLE CHEST 1 VIEW COMPARISON:  Chest x-ray 927 1,022. FINDINGS: The heart is enlarged. There is patchy airspace disease in the bilateral lower lungs, left greater than right. Costophrenic angles are clear. No pneumothorax. No acute fractures. IMPRESSION: 1. Bilateral lower  lung airspace disease, left greater than right, worrisome for infection. 2. Stable cardiomegaly. Electronically Signed   By: Ronney Asters M.D.   On: 10/19/2021 19:52   ECHOCARDIOGRAM COMPLETE  Result Date: 11/16/2021    ECHOCARDIOGRAM REPORT   Patient Name:   LILBURN MCCLEES Date of Exam: 11/16/2021 Medical Rec #:  [id number]     Height:       71.0 in Accession #:    [id number]  Weight:       281.3 lb Date of Birth:  1966-09-10      BSA:          2.438 m Patient Age:    2 years      BP:           120/80 mmHg Patient Gender: M             HR:           95 bpm. Exam Location:  Inpatient Procedure: 2D Echo, Cardiac Doppler and Color Doppler Indications:    CHF  History:        Patient has no prior history of Echocardiogram examinations.                 COPD, Signs/Symptoms:Shortness of Breath; Risk                 Factors:Hypertension.  Sonographer:    Clayton Lefort RDCS (AE) Referring Phys: [id number] RONDELL A SMITH IMPRESSIONS  1. Left ventricular ejection fraction, by estimation, is 60 to 65%. The left ventricle has normal function. The left ventricle has no regional wall motion abnormalities. Indeterminate diastolic filling due to E-A fusion. There is the interventricular septum is flattened in systole and diastole, consistent with right ventricular pressure and volume overload.  2. Right ventricular systolic function is moderately reduced. The right ventricular size is severely enlarged. There is severely elevated pulmonary artery systolic pressure. The estimated right ventricular systolic pressure is [id number] mmHg.  3. Right atrial size was mildly dilated.  4. A small pericardial effusion is present. The pericardial effusion is posterior to the left ventricle.  5. The mitral valve is grossly normal. No evidence of mitral valve regurgitation. No evidence of mitral stenosis.  6. Focal calcification of the LCC. The aortic valve is tricuspid. There is mild calcification of the aortic valve. Aortic valve regurgitation is  not visualized. No aortic stenosis is present.  7. There is mild dilatation of the ascending aorta, measuring 41 mm.  8. The inferior vena cava is dilated in size with <50% respiratory variability, suggesting right atrial pressure of 15 mmHg. Conclusion(s)/Recommendation(s): Findings consistent with Cor Pulmonale. FINDINGS  Left Ventricle: Left ventricular ejection fraction, by estimation, is 60 to 65%. The left ventricle has normal function. The left ventricle has no regional wall motion abnormalities. The left ventricular internal cavity size was normal in size. There is  no left ventricular hypertrophy. The interventricular septum is flattened in systole and diastole, consistent with right ventricular pressure and volume overload. Indeterminate diastolic filling due to E-A fusion. Right Ventricle: The right ventricular size is severely enlarged. No increase in right ventricular wall thickness. Right ventricular systolic function is moderately reduced. There is severely elevated pulmonary artery systolic pressure. The tricuspid regurgitant velocity is 3.46 m/s, and with an assumed right atrial pressure of 15 mmHg, the estimated right ventricular systolic pressure is [id number] mmHg. Left Atrium: Left atrial size was normal in size. Right Atrium: Right atrial size was mildly dilated. Pericardium: A small pericardial effusion is present. The pericardial effusion is posterior to the left ventricle. Mitral Valve: The mitral valve is grossly normal. No evidence of mitral valve regurgitation. No evidence of mitral valve stenosis. MV peak gradient, 6.4 mmHg. The mean mitral valve gradient is 3.0 mmHg. Tricuspid Valve: The tricuspid valve is grossly normal. Tricuspid valve regurgitation is mild . No evidence of tricuspid stenosis. Aortic Valve: Focal calcification of the LCC. The aortic valve is tricuspid. There is  mild calcification of the aortic valve. Aortic valve regurgitation is not visualized. No aortic stenosis is  present. Aortic valve mean gradient measures 5.0 mmHg. Aortic valve peak gradient measures 10.1 mmHg. Aortic valve area, by VTI measures 3.18 cm. Pulmonic Valve: The pulmonic valve was grossly normal. Pulmonic valve regurgitation is not visualized. No evidence of pulmonic stenosis. Aorta: The aortic root is normal in size and structure. There is mild dilatation of the ascending aorta, measuring 41 mm. Venous: The inferior vena cava is dilated in size with less than 50% respiratory variability, suggesting right atrial pressure of 15 mmHg. IAS/Shunts: The atrial septum is grossly normal.  LEFT VENTRICLE PLAX 2D LVIDd:         4.30 cm   Diastology LVIDs:         2.70 cm   LV e' medial:    9.75 cm/s LV PW:         1.60 cm   LV E/e' medial:  10.9 LV IVS:        1.50 cm   LV e' lateral:   13.10 cm/s LVOT diam:     2.00 cm   LV E/e' lateral: 8.1 LV SV:         80 LV SV Index:   33 LVOT Area:     3.14 cm  RIGHT VENTRICLE             IVC RV Basal diam:  5.60 cm     IVC diam: 3.00 cm RV Mid diam:    4.80 cm RV S prime:     19.60 cm/s TAPSE (M-mode): 1.9 cm LEFT ATRIUM             Index        RIGHT ATRIUM           Index LA diam:        4.40 cm 1.81 cm/m   RA Area:     25.20 cm LA Vol (A2C):   77.5 ml 31.79 ml/m  RA Volume:   79.70 ml  32.70 ml/m LA Vol (A4C):   70.3 ml 28.84 ml/m LA Biplane Vol: 73.6 ml 30.19 ml/m  AORTIC VALVE AV Area (Vmax):    3.20 cm AV Area (Vmean):   3.23 cm AV Area (VTI):     3.18 cm AV Vmax:           159.00 cm/s AV Vmean:          105.000 cm/s AV VTI:            0.253 m AV Peak Grad:      10.1 mmHg AV Mean Grad:      5.0 mmHg LVOT Vmax:         162.00 cm/s LVOT Vmean:        108.000 cm/s LVOT VTI:          0.256 m LVOT/AV VTI ratio: 1.01  AORTA Ao Root diam: 3.60 cm Ao Asc diam:  4.10 cm MITRAL VALVE                TRICUSPID VALVE MV Area (PHT): 4.10 cm     TR Peak grad:   47.9 mmHg MV Area VTI:   3.24 cm     TR Vmax:        346.00 cm/s MV Peak grad:  6.4 mmHg MV Mean grad:  3.0 mmHg      SHUNTS MV Vmax:       1.26 m/s  Systemic VTI:  0.26 m MV Vmean:      78.3 cm/s    Systemic Diam: 2.00 cm MV Decel Time: 185 msec MV E velocity: 106.00 cm/s MV A velocity: 111.00 cm/s MV E/A ratio:  0.95 Eleonore Chiquito MD Electronically signed by Eleonore Chiquito MD Signature Date/Time: 11/16/2021/4:57:32 PM    Final      The results of significant diagnostics from this hospitalization (including imaging, microbiology, ancillary and laboratory) are listed below for reference.     Microbiology: Recent Results (from the past 240 hour(s))  Resp Panel by RT-PCR (Flu A&B, Covid) Nasopharyngeal Swab     Status: None   Collection Time: 11/15/21  1:50 PM   Specimen: Nasopharyngeal Swab; Nasopharyngeal(NP) swabs in vial transport medium  Result Value Ref Range Status   SARS Coronavirus 2 by RT PCR NEGATIVE NEGATIVE Final    Comment: (NOTE) SARS-CoV-2 target nucleic acids are NOT DETECTED.  The SARS-CoV-2 RNA is generally detectable in upper respiratory specimens during the acute phase of infection. The lowest concentration of SARS-CoV-2 viral copies this assay can detect is 138 copies/mL. A negative result does not preclude SARS-Cov-2 infection and should not be used as the sole basis for treatment or other patient management decisions. A negative result may occur with  improper specimen collection/handling, submission of specimen other than nasopharyngeal swab, presence of viral mutation(s) within the areas targeted by this assay, and inadequate number of viral copies(<138 copies/mL). A negative result must be combined with clinical observations, patient history, and epidemiological information. The expected result is Negative.  Fact Sheet for Patients:  EntrepreneurPulse.com.au  Fact Sheet for Healthcare Providers:  IncredibleEmployment.be  This test is no t yet approved or cleared by the [place] FDA and  has been authorized for detection and/or  diagnosis of SARS-CoV-2 by FDA under an Emergency Use Authorization (EUA). This EUA will remain  in effect (meaning this test can be used) for the duration of the COVID-19 declaration under Section 564(b)(1) of the Act, 21 U.S.C.section 360bbb-3(b)(1), unless the authorization is terminated  or revoked sooner.       Influenza A by PCR NEGATIVE NEGATIVE Final   Influenza B by PCR NEGATIVE NEGATIVE Final    Comment: (NOTE) The Xpert Xpress SARS-CoV-2/FLU/RSV plus assay is intended as an aid in the diagnosis of influenza from Nasopharyngeal swab specimens and should not be used as a sole basis for treatment. Nasal washings and aspirates are unacceptable for Xpert Xpress SARS-CoV-2/FLU/RSV testing.  Fact Sheet for Patients: EntrepreneurPulse.com.au  Fact Sheet for Healthcare Providers: IncredibleEmployment.be  This test is not yet approved or cleared by the [place] FDA and has been authorized for detection and/or diagnosis of SARS-CoV-2 by FDA under an Emergency Use Authorization (EUA). This EUA will remain in effect (meaning this test can be used) for the duration of the COVID-19 declaration under Section 564(b)(1) of the Act, 21 U.S.C. section 360bbb-3(b)(1), unless the authorization is terminated or revoked.  Performed at [facility], [place] [address] [place], [place] [address]      Labs: BNP (last 3 results) Recent Labs    10/28/21 0944 11/15/21 1020  BNP 176.7* [id number]*   Basic Metabolic Panel: Recent Labs  Lab 11/15/21 1020 11/15/21 1403 11/15/21 1846 11/17/21 0121  NA 136 133* 137 138  K 3.8 7.4* 3.8 4.9  CL 90*  --  90* 85*  CO2 38*  --  40* >45*  GLUCOSE 109*  --  83 118*  BUN 6  --  5* 9  CREATININE 0.73  --  0.70 0.68  CALCIUM 8.8*  --  8.7* 9.0   Liver Function Tests: Recent Labs  Lab 11/15/21 1020  AST 19  ALT 10  ALKPHOS 64  BILITOT 0.5  PROT 5.7*  ALBUMIN 2.9*   Recent Labs  Lab  11/15/21 1020  LIPASE 21   Recent Labs  Lab 11/15/21 1848  AMMONIA 59*   CBC: Recent Labs  Lab 11/15/21 1020 11/15/21 1403 11/17/21 0121  WBC 5.6  --  5.4  NEUTROABS 3.4  --  3.3  HGB 15.7 18.4* 15.1  HCT 55.1* 54.0* 53.7*  MCV 92.4  --  94.2  PLT 205  --  177   Cardiac Enzymes: No results for input(s): CKTOTAL, CKMB, CKMBINDEX, TROPONINI in the last 168 hours. BNP: Invalid input(s): POCBNP CBG: Recent Labs  Lab 11/17/21 0605  GLUCAP 128*   D-Dimer No results for input(s): DDIMER in the last 72 hours. Hgb A1c No results for input(s): HGBA1C in the last 72 hours. Lipid Profile No results for input(s): CHOL, HDL, LDLCALC, TRIG, CHOLHDL, LDLDIRECT in the last 72 hours. Thyroid function studies No results for input(s): TSH, T4TOTAL, T3FREE, THYROIDAB in the last 72 hours.  Invalid input(s): FREET3 Anemia work up No results for input(s): VITAMINB12, FOLATE, FERRITIN, TIBC, IRON, RETICCTPCT in the last 72 hours. Urinalysis    Component Value Date/Time   COLORURINE YELLOW 11/15/2021 1133   APPEARANCEUR CLEAR 11/15/2021 1133   LABSPEC 1.011 11/15/2021 1133   PHURINE 6.0 11/15/2021 1133   GLUCOSEU NEGATIVE 11/15/2021 1133   HGBUR NEGATIVE 11/15/2021 1133   BILIRUBINUR NEGATIVE 11/15/2021 1133   BILIRUBINUR neg 03/14/2017 1351   KETONESUR NEGATIVE 11/15/2021 1133   PROTEINUR 30 (A) 11/15/2021 1133   UROBILINOGEN 0.2 03/14/2017 1351   UROBILINOGEN 0.2 10/20/2014 2104   NITRITE NEGATIVE 11/15/2021 1133   LEUKOCYTESUR NEGATIVE 11/15/2021 1133   Sepsis Labs Invalid input(s): PROCALCITONIN,  WBC,  LACTICIDVEN Microbiology Recent Results (from the past 240 hour(s))  Resp Panel by RT-PCR (Flu A&B, Covid) Nasopharyngeal Swab     Status: None   Collection Time: 11/15/21  1:50 PM   Specimen: Nasopharyngeal Swab; Nasopharyngeal(NP) swabs in vial transport medium  Result Value Ref Range Status   SARS Coronavirus 2 by RT PCR NEGATIVE NEGATIVE Final    Comment:  (NOTE) SARS-CoV-2 target nucleic acids are NOT DETECTED.  The SARS-CoV-2 RNA is generally detectable in upper respiratory specimens during the acute phase of infection. The lowest concentration of SARS-CoV-2 viral copies this assay can detect is 138 copies/mL. A negative result does not preclude SARS-Cov-2 infection and should not be used as the sole basis for treatment or other patient management decisions. A negative result may occur with  improper specimen collection/handling, submission of specimen other than nasopharyngeal swab, presence of viral mutation(s) within the areas targeted by this assay, and inadequate number of viral copies(<138 copies/mL). A negative result must be combined with clinical observations, patient history, and epidemiological information. The expected result is Negative.  Fact Sheet for Patients:  EntrepreneurPulse.com.au  Fact Sheet for Healthcare Providers:  IncredibleEmployment.be  This test is no t yet approved or cleared by the [place] FDA and  has been authorized for detection and/or diagnosis of SARS-CoV-2 by FDA under an Emergency Use Authorization (EUA). This EUA will remain  in effect (meaning this test can be used) for the duration of the COVID-19 declaration under Section 564(b)(1) of the Act, 21 U.S.C.section 360bbb-3(b)(1), unless the authorization is terminated  or revoked sooner.       Influenza A by PCR NEGATIVE NEGATIVE Final   Influenza B by PCR NEGATIVE NEGATIVE Final    Comment: (NOTE) The Xpert Xpress SARS-CoV-2/FLU/RSV plus assay is intended as an aid in the diagnosis of influenza from Nasopharyngeal swab specimens and should not be used as a sole basis for treatment. Nasal washings and aspirates are unacceptable for Xpert Xpress SARS-CoV-2/FLU/RSV testing.  Fact Sheet for Patients: EntrepreneurPulse.com.au  Fact Sheet for Healthcare  Providers: IncredibleEmployment.be  This test is not yet approved or cleared by the [place] FDA and has been authorized for detection and/or diagnosis of SARS-CoV-2 by FDA under an Emergency Use Authorization (EUA). This EUA will remain in effect (meaning this test can be used) for the duration of the COVID-19 declaration under Section 564(b)(1) of the Act, 21 U.S.C. section 360bbb-3(b)(1), unless the authorization is terminated or revoked.  Performed at [facility], [place] [address] [place], [place] [address]        SIGNED:   Debbe Odea, MD  [facility] 11/18/2021, 7:55 AM

## 2021-12-14 ENCOUNTER — Encounter (HOSPITAL_COMMUNITY): Payer: Self-pay | Admitting: Radiology

## 2022-02-04 ENCOUNTER — Inpatient Hospital Stay (HOSPITAL_COMMUNITY)
Admission: EM | Admit: 2022-02-04 | Discharge: 2022-02-12 | DRG: 291 | Disposition: A | Discharge status: Home / Self Care | Payer: Medicaid Other | Attending: Family Medicine | Admitting: Family Medicine

## 2022-02-04 ENCOUNTER — Other Ambulatory Visit: Payer: Self-pay

## 2022-02-04 ENCOUNTER — Emergency Department (HOSPITAL_COMMUNITY): Payer: Medicaid Other

## 2022-02-04 DIAGNOSIS — F418 Other specified anxiety disorders: Secondary | ICD-10-CM | POA: Diagnosis present

## 2022-02-04 DIAGNOSIS — E8770 Fluid overload, unspecified: Secondary | ICD-10-CM

## 2022-02-04 DIAGNOSIS — E8729 Other acidosis: Secondary | ICD-10-CM | POA: Diagnosis present

## 2022-02-04 DIAGNOSIS — Z79899 Other long term (current) drug therapy: Secondary | ICD-10-CM

## 2022-02-04 DIAGNOSIS — Z79891 Long term (current) use of opiate analgesic: Secondary | ICD-10-CM

## 2022-02-04 DIAGNOSIS — F1120 Opioid dependence, uncomplicated: Secondary | ICD-10-CM | POA: Diagnosis present

## 2022-02-04 DIAGNOSIS — I5033 Acute on chronic diastolic (congestive) heart failure: Secondary | ICD-10-CM | POA: Diagnosis present

## 2022-02-04 DIAGNOSIS — J9811 Atelectasis: Secondary | ICD-10-CM | POA: Diagnosis present

## 2022-02-04 DIAGNOSIS — J309 Allergic rhinitis, unspecified: Secondary | ICD-10-CM | POA: Diagnosis present

## 2022-02-04 DIAGNOSIS — Z86711 Personal history of pulmonary embolism: Secondary | ICD-10-CM

## 2022-02-04 DIAGNOSIS — G894 Chronic pain syndrome: Secondary | ICD-10-CM | POA: Diagnosis present

## 2022-02-04 DIAGNOSIS — I501 Left ventricular failure: Secondary | ICD-10-CM | POA: Diagnosis present

## 2022-02-04 DIAGNOSIS — E662 Morbid (severe) obesity with alveolar hypoventilation: Secondary | ICD-10-CM | POA: Diagnosis present

## 2022-02-04 DIAGNOSIS — G9341 Metabolic encephalopathy: Secondary | ICD-10-CM | POA: Diagnosis present

## 2022-02-04 DIAGNOSIS — I5082 Biventricular heart failure: Secondary | ICD-10-CM | POA: Diagnosis present

## 2022-02-04 DIAGNOSIS — Z20822 Contact with and (suspected) exposure to covid-19: Secondary | ICD-10-CM | POA: Diagnosis present

## 2022-02-04 DIAGNOSIS — F32A Depression, unspecified: Secondary | ICD-10-CM | POA: Diagnosis present

## 2022-02-04 DIAGNOSIS — I959 Hypotension, unspecified: Secondary | ICD-10-CM | POA: Diagnosis not present

## 2022-02-04 DIAGNOSIS — I110 Hypertensive heart disease with heart failure: Principal | ICD-10-CM | POA: Diagnosis present

## 2022-02-04 DIAGNOSIS — I509 Heart failure, unspecified: Secondary | ICD-10-CM

## 2022-02-04 DIAGNOSIS — I5031 Acute diastolic (congestive) heart failure: Secondary | ICD-10-CM

## 2022-02-04 DIAGNOSIS — J690 Pneumonitis due to inhalation of food and vomit: Secondary | ICD-10-CM | POA: Diagnosis not present

## 2022-02-04 DIAGNOSIS — Z91199 Patient's noncompliance with other medical treatment and regimen due to unspecified reason: Secondary | ICD-10-CM

## 2022-02-04 DIAGNOSIS — J9601 Acute respiratory failure with hypoxia: Secondary | ICD-10-CM | POA: Diagnosis present

## 2022-02-04 DIAGNOSIS — E785 Hyperlipidemia, unspecified: Secondary | ICD-10-CM | POA: Diagnosis present

## 2022-02-04 DIAGNOSIS — I2510 Atherosclerotic heart disease of native coronary artery without angina pectoris: Secondary | ICD-10-CM | POA: Diagnosis present

## 2022-02-04 DIAGNOSIS — J441 Chronic obstructive pulmonary disease with (acute) exacerbation: Secondary | ICD-10-CM | POA: Diagnosis present

## 2022-02-04 DIAGNOSIS — F1210 Cannabis abuse, uncomplicated: Secondary | ICD-10-CM | POA: Diagnosis present

## 2022-02-04 DIAGNOSIS — I712 Thoracic aortic aneurysm, without rupture, unspecified: Secondary | ICD-10-CM | POA: Diagnosis present

## 2022-02-04 DIAGNOSIS — Z6841 Body Mass Index (BMI) 40.0 and over, adult: Secondary | ICD-10-CM

## 2022-02-04 DIAGNOSIS — E875 Hyperkalemia: Secondary | ICD-10-CM | POA: Diagnosis not present

## 2022-02-04 DIAGNOSIS — I2609 Other pulmonary embolism with acute cor pulmonale: Secondary | ICD-10-CM | POA: Diagnosis present

## 2022-02-04 DIAGNOSIS — Z8719 Personal history of other diseases of the digestive system: Secondary | ICD-10-CM

## 2022-02-04 DIAGNOSIS — J9621 Acute and chronic respiratory failure with hypoxia: Secondary | ICD-10-CM

## 2022-02-04 DIAGNOSIS — Z5320 Procedure and treatment not carried out because of patient's decision for unspecified reasons: Secondary | ICD-10-CM | POA: Diagnosis not present

## 2022-02-04 DIAGNOSIS — Z720 Tobacco use: Secondary | ICD-10-CM

## 2022-02-04 DIAGNOSIS — Z8249 Family history of ischemic heart disease and other diseases of the circulatory system: Secondary | ICD-10-CM

## 2022-02-04 DIAGNOSIS — F17210 Nicotine dependence, cigarettes, uncomplicated: Secondary | ICD-10-CM | POA: Diagnosis present

## 2022-02-04 DIAGNOSIS — I3139 Other pericardial effusion (noninflammatory): Secondary | ICD-10-CM | POA: Diagnosis present

## 2022-02-04 DIAGNOSIS — E873 Alkalosis: Secondary | ICD-10-CM | POA: Diagnosis present

## 2022-02-04 DIAGNOSIS — R601 Generalized edema: Secondary | ICD-10-CM | POA: Diagnosis present

## 2022-02-04 DIAGNOSIS — I700 Atherosclerosis of aorta: Secondary | ICD-10-CM | POA: Diagnosis present

## 2022-02-04 DIAGNOSIS — I272 Pulmonary hypertension, unspecified: Secondary | ICD-10-CM | POA: Diagnosis present

## 2022-02-04 DIAGNOSIS — E878 Other disorders of electrolyte and fluid balance, not elsewhere classified: Secondary | ICD-10-CM | POA: Diagnosis present

## 2022-02-04 DIAGNOSIS — J449 Chronic obstructive pulmonary disease, unspecified: Secondary | ICD-10-CM | POA: Diagnosis present

## 2022-02-04 DIAGNOSIS — E871 Hypo-osmolality and hyponatremia: Secondary | ICD-10-CM | POA: Diagnosis present

## 2022-02-04 DIAGNOSIS — Z91030 Bee allergy status: Secondary | ICD-10-CM

## 2022-02-04 DIAGNOSIS — J9602 Acute respiratory failure with hypercapnia: Secondary | ICD-10-CM | POA: Diagnosis present

## 2022-02-04 DIAGNOSIS — Z96642 Presence of left artificial hip joint: Secondary | ICD-10-CM | POA: Diagnosis present

## 2022-02-04 DIAGNOSIS — M549 Dorsalgia, unspecified: Secondary | ICD-10-CM | POA: Diagnosis present

## 2022-02-04 DIAGNOSIS — I50811 Acute right heart failure: Secondary | ICD-10-CM | POA: Diagnosis present

## 2022-02-04 DIAGNOSIS — G2581 Restless legs syndrome: Secondary | ICD-10-CM | POA: Diagnosis present

## 2022-02-04 DIAGNOSIS — Z635 Disruption of family by separation and divorce: Secondary | ICD-10-CM

## 2022-02-04 DIAGNOSIS — G9340 Encephalopathy, unspecified: Secondary | ICD-10-CM | POA: Diagnosis present

## 2022-02-04 DIAGNOSIS — F410 Panic disorder [episodic paroxysmal anxiety] without agoraphobia: Secondary | ICD-10-CM | POA: Diagnosis present

## 2022-02-04 DIAGNOSIS — J9622 Acute and chronic respiratory failure with hypercapnia: Secondary | ICD-10-CM | POA: Diagnosis present

## 2022-02-04 HISTORY — DX: Pain in left hand: M79.642

## 2022-02-04 HISTORY — DX: Gastrointestinal hemorrhage, unspecified: K92.2

## 2022-02-04 LAB — CBC WITH DIFFERENTIAL/PLATELET
Abs Immature Granulocytes: 0.02 10*3/uL (ref 0.00–0.07)
Basophils Absolute: 0 10*3/uL (ref 0.0–0.1)
Basophils Relative: 0 %
Eosinophils Absolute: 0 10*3/uL (ref 0.0–0.5)
Eosinophils Relative: 0 %
HCT: 53.5 % — ABNORMAL HIGH (ref 39.0–52.0)
Hemoglobin: 15.2 g/dL (ref 13.0–17.0)
Immature Granulocytes: 0 %
Lymphocytes Relative: 34 %
Lymphs Abs: 2.7 10*3/uL (ref 0.7–4.0)
MCH: 25 pg — ABNORMAL LOW (ref 26.0–34.0)
MCHC: 28.4 g/dL — ABNORMAL LOW (ref 30.0–36.0)
MCV: 88.1 fL (ref 80.0–100.0)
Monocytes Absolute: 0.8 10*3/uL (ref 0.1–1.0)
Monocytes Relative: 11 %
Neutro Abs: 4.4 10*3/uL (ref 1.7–7.7)
Neutrophils Relative %: 55 %
Platelets: 209 10*3/uL (ref 150–400)
RBC: 6.07 MIL/uL — ABNORMAL HIGH (ref 4.22–5.81)
RDW: 18.6 % — ABNORMAL HIGH (ref 11.5–15.5)
WBC: 8 10*3/uL (ref 4.0–10.5)
nRBC: 0.3 % — ABNORMAL HIGH (ref 0.0–0.2)

## 2022-02-04 LAB — COMPREHENSIVE METABOLIC PANEL
ALT: 6 U/L (ref 0–44)
ALT: 7 U/L (ref 0–44)
AST: 31 U/L (ref 15–41)
AST: 15 U/L (ref 15–41)
Albumin: 2.7 g/dL — ABNORMAL LOW (ref 3.5–5.0)
Albumin: 2.9 g/dL — ABNORMAL LOW (ref 3.5–5.0)
Alkaline Phosphatase: 74 U/L (ref 38–126)
Alkaline Phosphatase: 81 U/L (ref 38–126)
Anion gap: 9 (ref 5–15)
Anion gap: 13 (ref 5–15)
BUN: 8 mg/dL (ref 6–20)
BUN: 8 mg/dL (ref 6–20)
CO2: 34 mmol/L — ABNORMAL HIGH (ref 22–32)
CO2: 33 mmol/L — ABNORMAL HIGH (ref 22–32)
Calcium: 8.4 mg/dL — ABNORMAL LOW (ref 8.9–10.3)
Calcium: 8.8 mg/dL — ABNORMAL LOW (ref 8.9–10.3)
Chloride: 86 mmol/L — ABNORMAL LOW (ref 98–111)
Chloride: 86 mmol/L — ABNORMAL LOW (ref 98–111)
Creatinine, Ser: 0.73 mg/dL (ref 0.61–1.24)
Creatinine, Ser: 0.8 mg/dL (ref 0.61–1.24)
GFR, Estimated: >60 mL/min (ref >60)
GFR, Estimated: >60 mL/min (ref >60)
Glucose, Bld: 100 mg/dL — ABNORMAL HIGH (ref 70–99)
Glucose, Bld: 208 mg/dL — ABNORMAL HIGH (ref 70–99)
Potassium: 4.8 mmol/L (ref 3.5–5.1)
Potassium: 3.8 mmol/L (ref 3.5–5.1)
Sodium: 129 mmol/L — ABNORMAL LOW (ref 135–145)
Sodium: 132 mmol/L — ABNORMAL LOW (ref 135–145)
Total Bilirubin: 1.1 mg/dL (ref 0.3–1.2)
Total Bilirubin: 0.7 mg/dL (ref 0.3–1.2)
Total Protein: 5.8 g/dL — ABNORMAL LOW (ref 6.5–8.1)
Total Protein: 6.7 g/dL (ref 6.5–8.1)

## 2022-02-04 LAB — URINALYSIS, ROUTINE W REFLEX MICROSCOPIC
Bilirubin Urine: NEGATIVE
Glucose, UA: NEGATIVE mg/dL
Hgb urine dipstick: NEGATIVE
Ketones, ur: NEGATIVE mg/dL
Leukocytes,Ua: NEGATIVE
Nitrite: NEGATIVE
Protein, ur: NEGATIVE mg/dL
Specific Gravity, Urine: 1.006 (ref 1.005–1.030)
pH: 6 (ref 5.0–8.0)

## 2022-02-04 LAB — BLOOD GAS, ARTERIAL
Acid-Base Excess: 16.6 mmol/L — ABNORMAL HIGH (ref 0.0–2.0)
Bicarbonate: 44.5 mmol/L — ABNORMAL HIGH (ref 20.0–28.0)
O2 Saturation: 91.4 %
Patient temperature: 36.7
pCO2 arterial: 66 mmHg (ref 32–48)
pH, Arterial: 7.43 (ref 7.35–7.45)
pO2, Arterial: 60 mmHg — ABNORMAL LOW (ref 83–108)

## 2022-02-04 LAB — LIPASE, BLOOD: Lipase: 50 U/L (ref 11–51)

## 2022-02-04 LAB — BLOOD GAS, VENOUS
Acid-Base Excess: 15.5 mmol/L — ABNORMAL HIGH (ref 0.0–2.0)
Bicarbonate: 44.2 mmol/L — ABNORMAL HIGH (ref 20.0–28.0)
Drawn by: 1444
O2 Saturation: 77.1 %
Patient temperature: 36.7
pCO2, Ven: 72 mmHg (ref 44–60)
pH, Ven: 7.39 (ref 7.25–7.43)
pO2, Ven: 46 mmHg — ABNORMAL HIGH (ref 32–45)

## 2022-02-04 LAB — TROPONIN I (HIGH SENSITIVITY)
Troponin I (High Sensitivity): 8 ng/L (ref <18)
Troponin I (High Sensitivity): 7 ng/L (ref <18)

## 2022-02-04 LAB — BRAIN NATRIURETIC PEPTIDE: B Natriuretic Peptide: 207.1 pg/mL — ABNORMAL HIGH (ref 0.0–100.0)

## 2022-02-04 MED ORDER — BENAZEPRIL HCL 40 MG PO TABS
40.0000 mg | ORAL_TABLET | Freq: Every day | ORAL | Status: DC
Start: 2022-02-05 — End: 2022-02-05
  Administered 2022-02-05: 40 mg via ORAL
  Filled 2022-02-04: qty 1

## 2022-02-04 MED ORDER — FUROSEMIDE 10 MG/ML IJ SOLN
40.0000 mg | Freq: Every day | INTRAMUSCULAR | Status: DC
  Administered 2022-02-04: 40 mg via INTRAVENOUS
  Filled 2022-02-04: qty 4

## 2022-02-04 MED ORDER — ENOXAPARIN SODIUM 40 MG/0.4ML IJ SOSY
40.0000 mg | PREFILLED_SYRINGE | Freq: Every day | INTRAMUSCULAR | Status: DC
  Administered 2022-02-05 – 2022-02-06 (×2): 40 mg via SUBCUTANEOUS
  Filled 2022-02-04 (×2): qty 0.4

## 2022-02-04 MED ORDER — IPRATROPIUM-ALBUTEROL 0.5-2.5 (3) MG/3ML IN SOLN
3.0000 mL | Freq: Once | RESPIRATORY_TRACT | Status: AC
Start: 2022-02-04 — End: 2022-02-04
  Administered 2022-02-04: 3 mL via RESPIRATORY_TRACT
  Filled 2022-02-04: qty 3

## 2022-02-04 MED ORDER — FUROSEMIDE 10 MG/ML IJ SOLN
60.0000 mg | Freq: Once | INTRAMUSCULAR | Status: AC
  Administered 2022-02-04: 60 mg via INTRAVENOUS
  Filled 2022-02-04: qty 6

## 2022-02-04 MED ORDER — BUPRENORPHINE HCL-NALOXONE HCL 8-2 MG SL SUBL
0.5000 | SUBLINGUAL_TABLET | Freq: Three times a day (TID) | SUBLINGUAL | Status: DC
  Administered 2022-02-04 – 2022-02-06 (×6): 0.5 via SUBLINGUAL
  Filled 2022-02-04 (×6): qty 1

## 2022-02-04 MED ORDER — AMLODIPINE BESYLATE 10 MG PO TABS
10.0000 mg | ORAL_TABLET | Freq: Every day | ORAL | Status: DC
  Administered 2022-02-05 – 2022-02-07 (×3): 10 mg via ORAL
  Filled 2022-02-04 (×3): qty 1

## 2022-02-04 NOTE — ED Provider Notes (Signed)
?[facility] EMERGENCY DEPARTMENT ?Provider Note ? ? ?CSN: [id number] ?Arrival date & time: 02/04/22  1541 ? ?  ? ?History ? ?Chief Complaint  ?Patient presents with  ? Shortness of Breath  ? ? ?Trevor Thomas is a 56 y.o. male. hypertension, TBI, GI bleed, anxiety, remote alcohol abuse, tobacco abuse, marijuana abuse, chronic pain and osteoarthritis.  Reports he has been having progressive shortness of breath, leg swelling.  Reports that he has also had increasing abdominal bloating and abdominal swelling.  Denies any associated chest pain.  No cough, no fever. ? ?Review chart, last admitted in February 2023 for respiratory failure, COPD exacerbation, heart failure. ? ?HPI ? ?  ? ?Home Medications ?Prior to Admission medications   ?Medication Sig Start Date End Date Taking? Authorizing Provider  ?acetaminophen (TYLENOL) 500 MG tablet Take 500 mg by mouth at bedtime.    [provider]  ?albuterol (PROVENTIL HFA;VENTOLIN HFA) 108 (90 Base) MCG/ACT inhaler Inhale 2 puffs into the lungs every 6 (six) hours as needed for wheezing. ?Patient not taking: Reported on 11/15/2021 01/30/17   Renne Musca, MD  ?amLODipine-benazepril (LOTREL) 10-40 MG capsule Take 1 capsule by mouth at bedtime. 06/03/20   [provider]  ?Buprenorphine HCl-Naloxone HCl 4-1 MG FILM Place 1 Film under the tongue 4 (four) times daily.    [provider]  ?doxycycline (MONODOX) 100 MG capsule Take 100 mg by mouth 2 (two) times daily. ?Patient not taking: Reported on 11/15/2021 11/02/21   [provider]  ?loratadine (CLARITIN) 10 MG tablet Take 10 mg by mouth at bedtime.    [provider]  ?   ? ?Allergies    ?Bee venom and Clonidine derivatives   ? ?Review of Systems   ?Review of Systems  ?Constitutional:  Negative for chills and fever.  ?HENT:  Negative for ear pain and sore throat.   ?Eyes:  Negative for pain and visual disturbance.  ?Respiratory:  Positive for chest tightness and  shortness of breath. Negative for cough.   ?Cardiovascular:  Negative for chest pain and palpitations.  ?Gastrointestinal:  Negative for abdominal pain and vomiting.  ?Genitourinary:  Negative for dysuria and hematuria.  ?Musculoskeletal:  Negative for arthralgias and back pain.  ?Skin:  Negative for color change and rash.  ?Neurological:  Negative for seizures and syncope.  ?All other systems reviewed and are negative. ? ?Physical Exam ?Updated Vital Signs ?BP 126/75   Pulse 100   Temp 98 ?F (36.7 ?C) (Oral)   Resp 14   Ht 5\' 11"  (1.803 m)   Wt 127 kg   SpO2 91%   BMI 39.05 kg/m?  ?Physical Exam ?Vitals and nursing note reviewed.  ?Constitutional:   ?   General: He is not in acute distress. ?   Appearance: He is well-developed.  ?HENT:  ?   Head: Normocephalic and atraumatic.  ?Eyes:  ?   Conjunctiva/sclera: Conjunctivae normal.  ?Cardiovascular:  ?   Rate and Rhythm: Normal rate and regular rhythm.  ?   Heart sounds: No murmur heard. ?Pulmonary:  ?   Effort: Pulmonary effort is normal.  ?   Comments: Bilateral expiratory wheezing ?Abdominal:  ?   Palpations: Abdomen is soft.  ?   Tenderness: There is no abdominal tenderness.  ?   Comments: Mildly distended, there is no tenderness to palpation, there is pitting edema in the lower abdomen  ?Musculoskeletal:     ?   General: No swelling.  ?   Cervical  back: Neck supple.  ?   Comments: Bilateral pitting edema in his lower legs  ?Skin: ?   General: Skin is warm and dry.  ?   Capillary Refill: Capillary refill takes less than 2 seconds.  ?Neurological:  ?   Mental Status: He is alert.  ?Psychiatric:     ?   Mood and Affect: Mood normal.  ? ? ?ED Results / Procedures / Treatments   ?Labs ?(all labs ordered are listed, but only abnormal results are displayed) ?Labs Reviewed  ?CBC WITH DIFFERENTIAL/PLATELET - Abnormal; Notable for the following components:  ?    Result Value  ? RBC 6.07 (*)   ? HCT 53.5 (*)   ? MCH 25.0 (*)   ? MCHC 28.4 (*)   ? RDW 18.6 (*)   ?  nRBC 0.3 (*)   ? All other components within normal limits  ?COMPREHENSIVE METABOLIC PANEL - Abnormal; Notable for the following components:  ? Sodium 129 (*)   ? Chloride 86 (*)   ? CO2 34 (*)   ? Glucose, Bld 100 (*)   ? Calcium 8.4 (*)   ? Total Protein 5.8 (*)   ? Albumin 2.7 (*)   ? All other components within normal limits  ?BRAIN NATRIURETIC PEPTIDE - Abnormal; Notable for the following components:  ? B Natriuretic Peptide 207.1 (*)   ? All other components within normal limits  ?LIPASE, BLOOD  ?URINALYSIS, ROUTINE W REFLEX MICROSCOPIC  ?TROPONIN I (HIGH SENSITIVITY)  ?TROPONIN I (HIGH SENSITIVITY)  ? ? ?EKG ?None ? ?Radiology ?DG Chest Portable 1 View ? ?Result Date: 02/04/2022 ?CLINICAL DATA:  Shortness of breath EXAM: PORTABLE CHEST 1 VIEW COMPARISON:  Radiograph 11/15/2021, chest CT 11/17/2021 FINDINGS: Unchanged cardiomegaly. There is central pulmonary vascular congestion. Probable mild edema. No focal airspace consolidation. No large pleural effusion. No visible pneumothorax. There is no acute osseous abnormality. Thoracic spondylosis. IMPRESSION: Cardiomegaly with central pulmonary vascular congestion and probable mild interstitial edema. Electronically Signed   By: Caprice Renshaw M.D.   On: 02/04/2022 15:56   ? ?Procedures ?Marland KitchenCritical Care ?Performed by: Milagros Loll, MD ?Authorized by: Milagros Loll, MD  ? ?Critical care provider statement:  ?  Critical care time (minutes):  38 ?  Critical care was necessary to treat or prevent imminent or life-threatening deterioration of the following conditions:  Respiratory failure ?  Critical care was time spent personally by me on the following activities:  Development of treatment plan with patient or surrogate, discussions with consultants, evaluation of patient's response to treatment, examination of patient, ordering and review of laboratory studies, ordering and review of radiographic studies, ordering and performing treatments and interventions,  pulse oximetry, re-evaluation of patient's condition and review of old charts  ? ? ?Medications Ordered in ED ?Medications  ?furosemide (LASIX) injection 60 mg (has no administration in time range)  ?ipratropium-albuterol (DUONEB) 0.5-2.5 (3) MG/3ML nebulizer solution 3 mL (has no administration in time range)  ? ? ?ED Course/ Medical Decision Making/ A&P ?  ?                        ?Medical Decision Making ?Amount and/or Complexity of Data Reviewed ?Labs: ordered. ?Radiology: ordered. ? ?Risk ?Prescription drug management. ?Decision regarding hospitalization. ? ? ?56 year old gentleman presenting to ER due to concern for shortness of breath.  He has abdominal distention with pitting edema in his abdomen as well as his lower legs.  His lungs have significant expiratory  wheezing.  Concern for COPD exacerbation, reactive airway disease as well as fluid overload state.  Similar admission earlier this year.  Patient received steroids prior to arrival.  Gave additional DuoNeb treatment here.  Wheezing is improved and he is still not in distress but is continuing to require supplemental nasal cannula to maintain oxygen saturations.  Additionally will give Lasix IV.  Will admit to medicine for further management.  Discussed case with family medicine resident, their team will come admit patient.  Patient left AMA last time, he states he is agreeable for admission at this time. ? ? ? ? ? ? ? ?Final Clinical Impression(s) / ED Diagnoses ?Final diagnoses:  ?COPD exacerbation (HCC)  ?Acute on chronic respiratory failure with hypoxia (HCC)  ?Heart failure, unspecified HF chronicity, unspecified heart failure type (HCC)  ?Anasarca  ?Hypervolemia, unspecified hypervolemia type  ? ? ?Rx / DC Orders ?ED Discharge Orders   ? ? None  ? ?  ? ? ?  ?Milagros Loll, MD ?02/04/22 1805 ? ?
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
## 2022-02-04 NOTE — ED Notes (Signed)
Report attempted 

## 2022-02-05 ENCOUNTER — Observation Stay (HOSPITAL_COMMUNITY): Payer: Medicaid Other

## 2022-02-05 ENCOUNTER — Encounter (HOSPITAL_COMMUNITY): Payer: Self-pay | Admitting: Student

## 2022-02-05 DIAGNOSIS — I700 Atherosclerosis of aorta: Secondary | ICD-10-CM | POA: Diagnosis present

## 2022-02-05 DIAGNOSIS — J9601 Acute respiratory failure with hypoxia: Secondary | ICD-10-CM | POA: Diagnosis not present

## 2022-02-05 DIAGNOSIS — J9622 Acute and chronic respiratory failure with hypercapnia: Secondary | ICD-10-CM | POA: Diagnosis present

## 2022-02-05 DIAGNOSIS — I501 Left ventricular failure: Secondary | ICD-10-CM | POA: Diagnosis present

## 2022-02-05 DIAGNOSIS — R601 Generalized edema: Secondary | ICD-10-CM

## 2022-02-05 DIAGNOSIS — F1210 Cannabis abuse, uncomplicated: Secondary | ICD-10-CM | POA: Diagnosis present

## 2022-02-05 DIAGNOSIS — I50810 Right heart failure, unspecified: Secondary | ICD-10-CM | POA: Diagnosis not present

## 2022-02-05 DIAGNOSIS — Z6841 Body Mass Index (BMI) 40.0 and over, adult: Secondary | ICD-10-CM | POA: Diagnosis not present

## 2022-02-05 DIAGNOSIS — J441 Chronic obstructive pulmonary disease with (acute) exacerbation: Secondary | ICD-10-CM | POA: Diagnosis present

## 2022-02-05 DIAGNOSIS — I3139 Other pericardial effusion (noninflammatory): Secondary | ICD-10-CM | POA: Diagnosis present

## 2022-02-05 DIAGNOSIS — I5033 Acute on chronic diastolic (congestive) heart failure: Secondary | ICD-10-CM | POA: Diagnosis present

## 2022-02-05 DIAGNOSIS — I5082 Biventricular heart failure: Secondary | ICD-10-CM | POA: Diagnosis present

## 2022-02-05 DIAGNOSIS — J449 Chronic obstructive pulmonary disease, unspecified: Secondary | ICD-10-CM | POA: Diagnosis not present

## 2022-02-05 DIAGNOSIS — G9340 Encephalopathy, unspecified: Secondary | ICD-10-CM | POA: Diagnosis not present

## 2022-02-05 DIAGNOSIS — E8729 Other acidosis: Secondary | ICD-10-CM | POA: Diagnosis present

## 2022-02-05 DIAGNOSIS — J9811 Atelectasis: Secondary | ICD-10-CM | POA: Diagnosis present

## 2022-02-05 DIAGNOSIS — I110 Hypertensive heart disease with heart failure: Secondary | ICD-10-CM | POA: Diagnosis present

## 2022-02-05 DIAGNOSIS — R0602 Shortness of breath: Secondary | ICD-10-CM | POA: Diagnosis not present

## 2022-02-05 DIAGNOSIS — Z515 Encounter for palliative care: Secondary | ICD-10-CM | POA: Diagnosis not present

## 2022-02-05 DIAGNOSIS — E873 Alkalosis: Secondary | ICD-10-CM | POA: Diagnosis present

## 2022-02-05 DIAGNOSIS — G9341 Metabolic encephalopathy: Secondary | ICD-10-CM | POA: Diagnosis present

## 2022-02-05 DIAGNOSIS — E662 Morbid (severe) obesity with alveolar hypoventilation: Secondary | ICD-10-CM | POA: Diagnosis present

## 2022-02-05 DIAGNOSIS — Z7189 Other specified counseling: Secondary | ICD-10-CM | POA: Diagnosis not present

## 2022-02-05 DIAGNOSIS — I272 Pulmonary hypertension, unspecified: Secondary | ICD-10-CM | POA: Diagnosis present

## 2022-02-05 DIAGNOSIS — Z20822 Contact with and (suspected) exposure to covid-19: Secondary | ICD-10-CM | POA: Diagnosis present

## 2022-02-05 DIAGNOSIS — J9621 Acute and chronic respiratory failure with hypoxia: Secondary | ICD-10-CM | POA: Diagnosis present

## 2022-02-05 DIAGNOSIS — G2581 Restless legs syndrome: Secondary | ICD-10-CM | POA: Diagnosis present

## 2022-02-05 DIAGNOSIS — I2609 Other pulmonary embolism with acute cor pulmonale: Secondary | ICD-10-CM | POA: Diagnosis present

## 2022-02-05 DIAGNOSIS — F32A Depression, unspecified: Secondary | ICD-10-CM | POA: Diagnosis present

## 2022-02-05 DIAGNOSIS — E785 Hyperlipidemia, unspecified: Secondary | ICD-10-CM | POA: Diagnosis present

## 2022-02-05 DIAGNOSIS — I50811 Acute right heart failure: Secondary | ICD-10-CM | POA: Diagnosis not present

## 2022-02-05 DIAGNOSIS — J690 Pneumonitis due to inhalation of food and vomit: Secondary | ICD-10-CM | POA: Diagnosis not present

## 2022-02-05 DIAGNOSIS — Z720 Tobacco use: Secondary | ICD-10-CM | POA: Diagnosis not present

## 2022-02-05 DIAGNOSIS — J9602 Acute respiratory failure with hypercapnia: Secondary | ICD-10-CM | POA: Diagnosis not present

## 2022-02-05 DIAGNOSIS — I509 Heart failure, unspecified: Secondary | ICD-10-CM

## 2022-02-05 DIAGNOSIS — I959 Hypotension, unspecified: Secondary | ICD-10-CM | POA: Diagnosis not present

## 2022-02-05 DIAGNOSIS — E871 Hypo-osmolality and hyponatremia: Secondary | ICD-10-CM | POA: Diagnosis present

## 2022-02-05 HISTORY — DX: Generalized edema: R60.1

## 2022-02-05 HISTORY — DX: Atherosclerosis of aorta: I70.0

## 2022-02-05 LAB — LIPID PANEL
Cholesterol: 135 mg/dL (ref 0–200)
HDL: 34 mg/dL — ABNORMAL LOW (ref >40)
LDL Cholesterol: 84 mg/dL (ref 0–99)
Total CHOL/HDL Ratio: 4 RATIO
Triglycerides: 83 mg/dL (ref <150)
VLDL: 17 mg/dL (ref 0–40)

## 2022-02-05 LAB — BASIC METABOLIC PANEL
Anion gap: 8 (ref 5–15)
BUN: 7 mg/dL (ref 6–20)
CO2: 40 mmol/L — ABNORMAL HIGH (ref 22–32)
Calcium: 8.9 mg/dL (ref 8.9–10.3)
Chloride: 86 mmol/L — ABNORMAL LOW (ref 98–111)
Creatinine, Ser: 0.74 mg/dL (ref 0.61–1.24)
GFR, Estimated: >60 mL/min (ref >60)
Glucose, Bld: 136 mg/dL — ABNORMAL HIGH (ref 70–99)
Potassium: 4.2 mmol/L (ref 3.5–5.1)
Sodium: 134 mmol/L — ABNORMAL LOW (ref 135–145)

## 2022-02-05 LAB — ECHOCARDIOGRAM COMPLETE
Area-P 1/2: 4.8 cm2
Calc EF: 75.4 %
Height: 71 in
S' Lateral: 3.2 cm
Single Plane A2C EF: 78.9 %
Single Plane A4C EF: 67.4 %
Weight: 4593.6 oz

## 2022-02-05 LAB — RESP PANEL BY RT-PCR (FLU A&B, COVID) ARPGX2
Influenza A by PCR: NEGATIVE
Influenza B by PCR: NEGATIVE
SARS Coronavirus 2 by RT PCR: NEGATIVE

## 2022-02-05 MED ORDER — ENOXAPARIN SODIUM 40 MG/0.4ML IJ SOSY
40.0000 mg | PREFILLED_SYRINGE | INTRAMUSCULAR | Status: DC
Start: 2022-02-05 — End: 2022-02-05

## 2022-02-05 MED ORDER — ACETAMINOPHEN 325 MG PO TABS
650.0000 mg | ORAL_TABLET | Freq: Four times a day (QID) | ORAL | Status: DC | PRN
  Administered 2022-02-05 – 2022-02-06 (×2): 650 mg via ORAL
  Filled 2022-02-05 (×2): qty 2

## 2022-02-05 MED ORDER — FUROSEMIDE 10 MG/ML IJ SOLN
40.0000 mg | Freq: Two times a day (BID) | INTRAMUSCULAR | Status: DC
  Administered 2022-02-05 – 2022-02-06 (×2): 40 mg via INTRAVENOUS
  Filled 2022-02-05 (×2): qty 4

## 2022-02-05 MED ORDER — NICOTINE 21 MG/24HR TD PT24
21.0000 mg | MEDICATED_PATCH | Freq: Every day | TRANSDERMAL | Status: DC
  Administered 2022-02-05 – 2022-02-12 (×8): 21 mg via TRANSDERMAL
  Filled 2022-02-05 (×8): qty 1

## 2022-02-05 MED ORDER — FUROSEMIDE 10 MG/ML IJ SOLN
40.0000 mg | Freq: Once | INTRAMUSCULAR | Status: AC
  Administered 2022-02-05: 40 mg via INTRAVENOUS
  Filled 2022-02-05: qty 4

## 2022-02-05 NOTE — Progress Notes (Signed)
FPTS Brief Progress Note ? ?S: Spoke with patient at bedside.  Discussed patient's need for CPAP/BiPAP.  Patient reports he does not want BiPAP because he cannot take how it sucks the air out.  He reports considerable anxiety regarding the BiPAP because his father was on BiPAP when he passed away. ? ? ?O: ?BP 115/71 (BP Location: Right Arm)   Pulse (!) 107   Temp (!) 97.2 ?F (36.2 ?C) (Oral)   Resp 20   Ht 5\' 11"  (1.803 m)   Wt 130.2 kg   SpO2 92%   BMI 40.04 kg/m?   ? ? ?A/P: ?Hypoxic respiratory failure likely 2/2 PAH versus COPD ?Shortness of breath worse when lying flat, continuing to diurese, patient is willing to try CPAP machine. ?-Rest of plan per day team ?- Orders reviewed. Labs for AM ordered, which was adjusted as needed.  ? ? ? , MD ?02/05/2022, 11:43 PM ?PGY-3, [facility] Night Resident  ?Please page [phone] with questions.  ? ? ?

## 2022-02-05 NOTE — Progress Notes (Signed)
?  Echocardiogram ?2D Echocardiogram has been performed. ? ?[name] ?02/05/2022, 11:18 AM ?

## 2022-02-05 NOTE — Progress Notes (Signed)
Family Medicine Teaching Service ?Daily Progress Note ?Intern Pager: [phone] ? ?Patient name: Trevor Thomas Medical record number: [id number] ?Date of birth: November 05, 1965 Age: 56 y.o. Gender: male ? ?Primary Care Provider: [facility] ?Consultants: None ?Code Status: Full ? ?Pt Overview and Major Events to Date:  ?5/5 -patient admitted ? ?Assessment and Plan: ? ?Trevor Thomas is a 56 y.o. male presenting with shortness of breath, abdominal swelling. PMH is significant for HTN, HLD, COPD, polysubstance use (opioids, marijuana, tobacco), chronic pain with osteoarthritis, TBI, anxiety, Hx GI bleed, hx of etoh abuse,  ?  ?Acute hypoxic respiratory failure likely 2/2 HFpEF vs COPD exacerbation  ?Patient on 6 L nasal cannula O2, satting 93%, increased from 4 L nasal cannula last night. Patient received 40 units Lasix overnight with UOP of 900 cc and 3 unmeasured occurrences. Weight 130.2 kg from 132.9 kg yesterday evening. Patient HF concerning for cor pulmonale (noted on last admission, 2/23, after 2D echo showed elevated PA pressure, RV pressure), etiology possibly PAH versus COPD.  We will reach out to cardiology for evaluation of heart failure and potential consideration of cath. Etiology of respiratory failure like HF over COPD or cirrhosis.  ?-F/u Echo ?-F/u RUQ [place] ?-Continuous pulse ox, SpO2 goal 88-92% ?-Strict I/O ?-Daily weights ?-Continuous cardiac monitoring ?-Vitals per unit ?-Consult Cardiology ?-Lasix 40 mg  ?-AM BMP ? ? ?HLD ?Last lipid panel 2018, LDL 132, T. Chol 213, not on medication ?-F/u lipid panel ?-consider statin ? ?HTN ?BP stable, range 110s-130s/70s to 90s ?-continue amlodipine-benazepril 10-40 mg daily ? ?Polysubstance abuse  Chronic back pain ?Hx of EtOh abuse, with tobacco abuse, marijuana use, on suboxone. CIWA and COWS 0 over night. ?-F/u UDS ?-TOC consult for substance use counseling ?-COWS ?-CIWA ?-Nicotine patch 21 mg daily ?-Suboxone 4-1 mg TID ? ? ?FEN/GI: Heart Healthy ?PPx:  Lovenox ?Dispo:Home in 2-3 days.  ? ?Subjective:  ?Patient with no complaints today. ? ?Objective: ?Temp:  [97.7 ?F (36.5 ?C)-99.1 ?F (37.3 ?C)] 98.3 ?F (36.8 ?C) (05/06 0541) ?Pulse Rate:  [89-110] 108 (05/06 0541) ?Resp:  [13-22] 17 (05/06 0541) ?BP: (117-137)/(66-93) 137/93 (05/06 0541) ?SpO2:  [78 %-99 %] 93 % (05/06 0541) ?Weight:  [127 kg-132.9 kg] 130.2 kg (05/06 0541) ?Physical Exam: ?General: Ill appearing, obese, NAD, white male ?Cardiovascular: RRR, NRMG ?Respiratory: Coarse breath sounds, decreased breath sounds in lower lung fields ?Abdomen: Tought, distended, NTTP ?Extremities: 2+ pitting edema to level of hip ? ?Laboratory: ?Recent Labs  ?Lab 02/04/22 ?1541  ?WBC 8.0  ?HGB 15.2  ?HCT 53.5*  ?PLT 209  ? ?Recent Labs  ?Lab 02/04/22 ?1541 02/04/22 ?2140 02/05/22 ?0140  ?NA 129* 132* 134*  ?K 4.8 3.8 4.2  ?CL 86* 86* 86*  ?CO2 34* 33* 40*  ?BUN 8 8 7   ?CREATININE 0.73 0.80 0.74  ?CALCIUM 8.4* 8.8* 8.9  ?PROT 5.8* 6.7  --   ?BILITOT 1.1 0.7  --   ?ALKPHOS 74 81  --   ?ALT 6 7  --   ?AST 31 15  --   ?GLUCOSE 100* 208* 136*  ? ? ? ? ?Imaging/Diagnostic Tests: ? ? ? , MD ?02/05/2022, 6:23 AM ?PGY-1, [facility] ?FPTS Intern pager: [phone], text pages welcome ? ?

## 2022-02-05 NOTE — Progress Notes (Signed)
FPTS Brief Progress Note ? ?S:Went to bedside to see patient, patient sleeping comfortably.  ? ? ?O: ?BP 131/82   Pulse (!) 110   Temp 97.7 ?F (36.5 ?C)   Resp 16   Ht 5\' 11"  (1.803 m)   Wt 132.9 kg   SpO2 (!) 89%   BMI 40.88 kg/m?   ? ? ?A/P: ?- Plans per day team ?- Orders reviewed. Labs for AM ordered, which was adjusted as needed.  ? ? ? , MD ?02/05/2022, 4:29 AM ?PGY-1, [facility] Night Resident  ?Please page [phone] with questions.  ? ? ?
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
## 2022-02-06 ENCOUNTER — Inpatient Hospital Stay (HOSPITAL_COMMUNITY): Payer: Medicaid Other

## 2022-02-06 DIAGNOSIS — J9601 Acute respiratory failure with hypoxia: Secondary | ICD-10-CM

## 2022-02-06 DIAGNOSIS — J9621 Acute and chronic respiratory failure with hypoxia: Secondary | ICD-10-CM

## 2022-02-06 DIAGNOSIS — I501 Left ventricular failure: Secondary | ICD-10-CM

## 2022-02-06 DIAGNOSIS — J9602 Acute respiratory failure with hypercapnia: Secondary | ICD-10-CM

## 2022-02-06 DIAGNOSIS — I50811 Acute right heart failure: Secondary | ICD-10-CM | POA: Diagnosis not present

## 2022-02-06 DIAGNOSIS — Z6841 Body Mass Index (BMI) 40.0 and over, adult: Secondary | ICD-10-CM

## 2022-02-06 DIAGNOSIS — F1120 Opioid dependence, uncomplicated: Secondary | ICD-10-CM

## 2022-02-06 DIAGNOSIS — G9340 Encephalopathy, unspecified: Secondary | ICD-10-CM | POA: Diagnosis not present

## 2022-02-06 DIAGNOSIS — I2609 Other pulmonary embolism with acute cor pulmonale: Secondary | ICD-10-CM | POA: Diagnosis not present

## 2022-02-06 DIAGNOSIS — R601 Generalized edema: Secondary | ICD-10-CM

## 2022-02-06 DIAGNOSIS — I50810 Right heart failure, unspecified: Secondary | ICD-10-CM

## 2022-02-06 DIAGNOSIS — J441 Chronic obstructive pulmonary disease with (acute) exacerbation: Secondary | ICD-10-CM | POA: Diagnosis not present

## 2022-02-06 LAB — BASIC METABOLIC PANEL
Anion gap: 8 (ref 5–15)
BUN: 7 mg/dL (ref 6–20)
CO2: 43 mmol/L — ABNORMAL HIGH (ref 22–32)
Calcium: 8.9 mg/dL (ref 8.9–10.3)
Chloride: 85 mmol/L — ABNORMAL LOW (ref 98–111)
Creatinine, Ser: 0.67 mg/dL (ref 0.61–1.24)
GFR, Estimated: >60 mL/min (ref >60)
Glucose, Bld: 118 mg/dL — ABNORMAL HIGH (ref 70–99)
Potassium: 4.1 mmol/L (ref 3.5–5.1)
Sodium: 136 mmol/L (ref 135–145)

## 2022-02-06 LAB — RAPID URINE DRUG SCREEN, HOSP PERFORMED
Amphetamines: NOT DETECTED
Barbiturates: NOT DETECTED
Benzodiazepines: NOT DETECTED
Cocaine: NOT DETECTED
Opiates: NOT DETECTED
Tetrahydrocannabinol: POSITIVE — AB

## 2022-02-06 LAB — BLOOD GAS, ARTERIAL
O2 Saturation: 86.6 %
Patient temperature: 37
pCO2 arterial: >123 mmHg (ref 32–48)
pH, Arterial: 7.2 — ABNORMAL LOW (ref 7.35–7.45)
pO2, Arterial: 61 mmHg — ABNORMAL LOW (ref 83–108)

## 2022-02-06 LAB — CBC
HCT: 55.5 % — ABNORMAL HIGH (ref 39.0–52.0)
Hemoglobin: 14.7 g/dL (ref 13.0–17.0)
MCH: 24.4 pg — ABNORMAL LOW (ref 26.0–34.0)
MCHC: 26.5 g/dL — ABNORMAL LOW (ref 30.0–36.0)
MCV: 92 fL (ref 80.0–100.0)
Platelets: 192 10*3/uL (ref 150–400)
RBC: 6.03 MIL/uL — ABNORMAL HIGH (ref 4.22–5.81)
RDW: 18.1 % — ABNORMAL HIGH (ref 11.5–15.5)
WBC: 5.9 10*3/uL (ref 4.0–10.5)
nRBC: 0 % (ref 0.0–0.2)

## 2022-02-06 MED ORDER — NALOXONE HCL 0.4 MG/ML IJ SOLN
INTRAMUSCULAR | Status: AC
  Filled 2022-02-06: qty 1

## 2022-02-06 MED ORDER — ONDANSETRON 4 MG PO TBDP
8.0000 mg | ORAL_TABLET | Freq: Three times a day (TID) | ORAL | Status: DC | PRN
  Filled 2022-02-06: qty 2

## 2022-02-06 MED ORDER — ONDANSETRON HCL 4 MG PO TABS: 8.0000 mg | ORAL_TABLET | Freq: Three times a day (TID) | ORAL | Status: DC | PRN

## 2022-02-06 MED ORDER — SODIUM CHLORIDE 0.9 % IV SOLN
3.0000 g | Freq: Four times a day (QID) | INTRAVENOUS | Status: DC
  Administered 2022-02-06 – 2022-02-07 (×3): 3 g via INTRAVENOUS
  Filled 2022-02-06 (×3): qty 8

## 2022-02-06 MED ORDER — LIDOCAINE HCL URETHRAL/MUCOSAL 2 % EX GEL
1.0000 | Freq: Once | CUTANEOUS | Status: AC
Start: 2022-02-06 — End: 2022-02-06
  Administered 2022-02-06: 1 via URETHRAL
  Filled 2022-02-06: qty 6

## 2022-02-06 MED ORDER — ONDANSETRON 4 MG PO TBDP
8.0000 mg | ORAL_TABLET | Freq: Once | ORAL | Status: AC
  Filled 2022-02-06: qty 2

## 2022-02-06 MED ORDER — OXYMETAZOLINE HCL 0.05 % NA SOLN
1.0000 | Freq: Two times a day (BID) | NASAL | Status: AC
  Administered 2022-02-06: 1 via NASAL
  Filled 2022-02-06: qty 30

## 2022-02-06 MED ORDER — CHLORHEXIDINE GLUCONATE CLOTH 2 % EX PADS
6.0000 | MEDICATED_PAD | Freq: Every day | CUTANEOUS | Status: DC
  Administered 2022-02-07 – 2022-02-10 (×4): 6 via TOPICAL

## 2022-02-06 MED ORDER — POLYETHYLENE GLYCOL 3350 17 G PO PACK: 17.0000 g | PACK | Freq: Every day | ORAL | Status: DC

## 2022-02-06 MED ORDER — SODIUM CHLORIDE 0.9 % IV SOLN
8.0000 mg | Freq: Three times a day (TID) | INTRAVENOUS | Status: DC | PRN
  Filled 2022-02-06: qty 4

## 2022-02-06 MED ORDER — IPRATROPIUM-ALBUTEROL 0.5-2.5 (3) MG/3ML IN SOLN: 3.0000 mL | RESPIRATORY_TRACT | Status: DC | PRN

## 2022-02-06 MED ORDER — SODIUM CHLORIDE 0.9 % IV SOLN
8.0000 mg | Freq: Once | INTRAVENOUS | Status: AC
  Administered 2022-02-06: 8 mg via INTRAVENOUS
  Filled 2022-02-06: qty 4

## 2022-02-06 MED ORDER — FUROSEMIDE 10 MG/ML IJ SOLN
80.0000 mg | Freq: Two times a day (BID) | INTRAMUSCULAR | Status: DC
  Administered 2022-02-06 – 2022-02-08 (×5): 80 mg via INTRAVENOUS
  Filled 2022-02-06 (×5): qty 8

## 2022-02-06 MED ORDER — LACTULOSE ENEMA
300.0000 mL | Freq: Once | ORAL | Status: DC
  Filled 2022-02-06: qty 300

## 2022-02-06 MED ORDER — FUROSEMIDE 10 MG/ML IJ SOLN
40.0000 mg | Freq: Once | INTRAMUSCULAR | Status: AC
  Administered 2022-02-06: 40 mg via INTRAVENOUS
  Filled 2022-02-06: qty 4

## 2022-02-06 MED ORDER — NALOXONE HCL 0.4 MG/ML IJ SOLN
0.4000 mg | INTRAMUSCULAR | Status: DC | PRN
  Administered 2022-02-06: 0.4 mg via INTRAVENOUS

## 2022-02-06 MED ORDER — ONDANSETRON HCL 4 MG PO TABS
8.0000 mg | ORAL_TABLET | Freq: Once | ORAL | Status: AC
  Administered 2022-02-06: 8 mg via ORAL
  Filled 2022-02-06: qty 2
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
## 2022-02-06 NOTE — Significant Event (Signed)
Rapid Response Event Note  ? ?Reason for Call : Respiratory failure ?Initial Focused Assessment:  ?Notified by nursing staff regarding Mr. Bramel clinical condition and FPTS at bedside. Upon arrival, Mr. Arney was arousable to deep, persistent noxious stimuli however would go immediately back snoring. Partially obstructing his airway. Narcan given and he started to become more awake and answer questions when he would cooperate. Oriented to person, place and time. BBS coarse rhonchi with minimal abdominal accessory muscle use. Skin pink, warm, dry and grossly edematous. Pt currently on [place] HFNC at 15L with sats 94% when awake. ABG, PCXR, BIPAP, foley and lasix ordered. PCCM consulted. ICU transfer not necessary currently because he is more awake and compliant with BIPAP.  ? ?ABG- 7.2/>123/61/? ?2150-98.57F, 109 ST, 130/82, RR 19 with sats 97% on BIPAP 14/6 ? ? ?Interventions:  ?-Per orders, PCXR, ABG, BIPAP, Foley, Lasix ? ?Plan of Care:  ?-Maintain BIPAP overnight ?-Notify MD for further orders ? ? ? ?MD Notified: Dr. Leary Roca at bedside ?Call Time: 2025 ?Arrival Time: 2035 ?End Time: 2145 ? ?Rose Fillers, RN ?

## 2022-02-06 NOTE — Progress Notes (Signed)
Update: called both available numbers in chart for patient's family member, [phone] number does not work. 1030 mobile number with no answer, VM not set up. Will try later. ? ?Shirlean Mylar, MD ?[facility], PGY-3 ? ?

## 2022-02-06 NOTE — Progress Notes (Signed)
?   02/06/22 2105  ?Assess: MEWS Score  ?Pulse Rate (!) 117  ?Resp (!) 32  ?SpO2 97 %  ?Assess: MEWS Score  ?MEWS Temp 0  ?MEWS Systolic 0  ?MEWS Pulse 2  ?MEWS RR 2  ?MEWS LOC 0  ?MEWS Score 4  ?MEWS Score Color Red  ?Assess: if the MEWS score is Yellow or Red  ?Were vital signs taken at a resting state? Yes  ?Focused Assessment Change from prior assessment (see assessment flowsheet)  ?Early Detection of Sepsis Score *See Row Information* Low  ?MEWS guidelines implemented *See Row Information* Yes  ?Treat  ?MEWS Interventions Consulted Respiratory Therapy;Escalated (See documentation below);Administered scheduled meds/treatments ?(Dr. Rojelio Brenner at bedside)  ?Pain Scale 0-10  ?Pain Score 0  ?Take Vital Signs  ?Increase Vital Sign Frequency  Red: Q 1hr X 4 then Q 4hr X 4, if remains red, continue Q 4hrs  ?Escalate  ?MEWS: Escalate Red: discuss with charge nurse/RN and provider, consider discussing with RRT  ?Notify: Charge Nurse/RN  ?Name of Charge Nurse/RN Notified Josph Macho  ?Date Charge Nurse/RN Notified 02/06/22  ?Time Charge Nurse/RN Notified 2105  ?Notify: Provider  ?Provider Name/Title Dr. Chauncey Reading  ?Date Provider Notified 02/06/22  ?Time Provider Notified 2105  ?Notification Type Face-to-face  ?Notification Reason Change in status ?(MD at bedside already)  ?Provider response At bedside  ?Date of Provider Response 02/06/22  ?Time of Provider Response 2105  ?Notify: Rapid Response  ?Name of Rapid Response RN Notified Shanon Brow  ?Date Rapid Response Notified 02/06/22  ?Time Rapid Response Notified 2025  ?Document  ?Patient Outcome Not stable and remains on department  ? ?At 2020 RN paged MD as pt difficult arouse and Spo2 74-86%. Dr. Chauncey Reading came to bedside to evaluate pt, and RRT Shanon Brow called and came to bedside. Patient currently on Bipap, attempted multiple times to get out of bed. Patient spo2 99% on BiPap. Lasix, Narcan, nasal spray, and IV abx given. RN remains with pt at bedside for close monitoring and frequent  redirection. ?

## 2022-02-06 NOTE — Progress Notes (Signed)
Pharmacy Antibiotic Note ? ?Tion Trevor Thomas is a 56 y.o. male admitted on 02/04/2022 with SOB. Pharmacy has been consulted for Unasyn dosing with concerns for aspiration event. Cr <1. ? ?Plan: ?Unasyn 3g IV q6h ? ?Height: 5\' 11"  (180.3 cm) ?Weight: 129.8 kg (286 lb 3.2 oz) ?IBW/kg (Calculated) : 75.3 ? ?Temp (24hrs), Avg:98.6 ?F (37 ?C), Min:98.2 ?F (36.8 ?C), Max:99.1 ?F (37.3 ?C) ? ?Recent Labs  ?Lab 02/04/22 ?1541 02/04/22 ?2140 02/05/22 ?0140 02/06/22 ?0252  ?WBC 8.0  --   --   --   ?CREATININE 0.73 0.80 0.74 0.67  ?  ?Estimated Creatinine Clearance: 141.6 mL/min (by C-G formula based on SCr of 0.67 mg/dL).   ? ?Allergies  ?Allergen Reactions  ? Bee Venom Anaphylaxis  ? Clonidine Derivatives Other (See Comments)  ?  Stomach pain  ? ? ?Antimicrobials this admission: ?Unasyn 5/7 >> ? ?7/7, PharmD, BCPS, BCCP ?Clinical Pharmacist ?[phone] ?Please check AMION for all [facility] Pharmacy numbers ?02/06/2022 ? ? ?

## 2022-02-06 NOTE — Progress Notes (Signed)
FPTS Interim Progress Note ? ?S:Received page from nurse that patient has been nauseous and vomiting with subsequent nosebleeds during emesis.  Wrote for IV Zofran, RN still waiting med from pharmacy.  Upon entering the room, patient was seen sitting up on side of bed with trash can in front of him.  Patient appeared to feel ill.  Blood smattered tissue and just can.  Patient reports he began to be nauseous after a.m. check with abdominal palpation.  He then reports he started vomiting after he ate some food.  The nosebleed with emesis only happens when he has "too much volume on me". ? ?O: ?BP 133/87 (BP Location: Right Arm)   Pulse (!) 102   Temp 99.1 ?F (37.3 ?C) (Oral)   Resp 20   Ht 5\' 11"  (1.803 m)   Wt 129.8 kg   SpO2 94%   BMI 39.92 kg/m?   ?General: Awake, alert, appears to feel unwell, intermittent jerking shakes of shoulders and BUE ?Cardiac: RRR, no murmur ?Respiratory: CTAB ? ?A/P: ?Nausea, vomiting, nosebleed ?Initially some concern for withdrawal.  However COWS and CIWA have been 0.  Patient reports last drink was "years ago".  For opioid dependence, he is currently on Suboxone, it is ordered here inpatient. ?- Order for Zofran 8 mg, linked order for IV, ODT, and tablet per nurse and patient preference (will need IV now) ?- Afrin each nare for vasoconstriction of bleeding vessels x 1 dose ?- We will continue to monitor closely ? ? , MD ?02/06/2022, 6:00 PM ?PGY-2, [facility] ?Service pager [phone]  ?

## 2022-02-06 NOTE — Plan of Care (Signed)
  Problem: Education: Goal: Knowledge of General Education information will improve Description: Including pain rating scale, medication(s)/side effects and non-pharmacologic comfort measures Outcome: Progressing   Problem: Activity: Goal: Risk for activity intolerance will decrease Outcome: Progressing   Problem: Elimination: Goal: Will not experience complications related to bowel motility Outcome: Progressing Goal: Will not experience complications related to urinary retention Outcome: Progressing   Problem: Pain Managment: Goal: General experience of comfort will improve Outcome: Progressing   

## 2022-02-06 NOTE — Progress Notes (Signed)
Mobility Specialist Progress Note: ? ? 02/06/22 1025  ?Mobility  ?Activity Ambulated with assistance in room  ?Level of Assistance +2 (takes two people)  ?Assistive Device None  ?Distance Ambulated (ft) 15 ft  ?$Mobility charge 1 Mobility  ? ?Responded to unwitnessed fall in room. Pt ambulating back from BR when feet slipped out from underneath him. Pt assisted up from floor with +2 assistance. Ambulated back to bed with no LOB/unsteadiness. Pt left with RN in room to assess. ? ?Trevor Thomas ?Acute Rehab ?Phone: [phone] ?Office Phone: [phone] ? ?

## 2022-02-06 NOTE — Progress Notes (Addendum)
FPTS Brief Progress Note ? ?S: Received call from RN that patient was somnolent, not awaking to sternal rub. No sedating meds. On suboxone 4-2 QID at home, though TID here per patient report. He had several episodes of epistaxis easily stopped with pressure earlier today due to [place] use, episode of emesis earlier today, also fall, suspect from weakness, see earlier notes. Pt still with clear volume overload on exam with pitting edema and anasarca despite lasix, increased to 80 mg IV BID today. UOP charted as 1L during the day shift. Went to examine patient with Dr. Laroy Apple, he had diffuse rhonchi, change since admission on Friday when I last saw patient. Patient GCS <8 on initial exam, unable to awaken to pain, did not move despite ABG. Sats recorded in 88-90% range. Ordered ABG, narcan x1, CXR, called rapid/CCM. ? ?O: ?BP 121/77 (BP Location: Right Arm)   Pulse (!) 117   Temp 98.7 ?F (37.1 ?C) (Axillary)   Resp (!) 32   Ht 5\' 11"  (1.803 m)   Wt 129.8 kg   SpO2 97%   BMI 39.92 kg/m?   ?Nursing note and vitals reviewed ?GEN: obtunded white male in distress, class III obesity ?HEENT: dried blood around [place] prongs ?Cardiac: tachycardic rate and regular rhythm. Normal S1/S2. No murmurs, rubs, or gallops appreciated. 2+ radial pulses. ?Lungs: Diffuse rhonchi in all lung fields ?Abdomen: soft, distended, NT. ?Neuro: fully obtunded ?Ext: 2+ pitting edema to mid thighs ? ?A/P: ? ?AMS  Hypercarbia with respiratory acidosis ?- Patient had GCS <8 on initial exam, but awoke after narcan, was alert and oriented x2 (baseline Aox4). Patient received no sedating med other than buprenorphine per his home regimen.  ?- UDS ?- CCM consulted, appreciate CCM recommendations and assistance ?- ABG with pCO2 >123, pH 7.2, acute change from admission.  ?- CXR with increased opacities in RLL, concern for aspiration PNA, worsened pulmonary edema ?- Repeat dose of IV lasix 80 mg IV ?- Add Unasyn for potential aspiration PNA ?- Trial of  BiPAP initiated, afrin for epistaxis, zofran for nausea. If patient can tolerate, will continue progressive care. If unable to maintain BiPAP, will transfer to ICU. ?- If condition changes, plan includes transfer to ICU.  ?- Will call and update family,  ? ?Urinary obstruction ?Patient reporting having to use bathroom, too weak to ambulate to restroom, unable to urinate with urinal. Due to patient confusion and repeated redirection to stay in bed (multiple attempts to get out of bed despite redirection with several near falls prevented by staff), inability to make urine, foley catheter placed with 1L immediate output.  ?-Foley catheter  ?-Strict I/Os ? ?Otho Najjar, MD ?02/06/2022, 9:26 PM ?PGY-3, [facility] Night Resident  ?Please page [phone] with questions.  ? ? ?

## 2022-02-06 NOTE — Progress Notes (Signed)
? ?Progress Note ? ?Patient Name: Trevor Thomas ?Date of Encounter: 02/06/2022 ? ?[facility] Cardiologist: Dr Royann Shivers ? ?Subjective  ? ?Denies dyspnea or CP ? ?Inpatient Medications  ?  ?Scheduled Meds: ? amLODipine  10 mg Oral Daily  ? buprenorphine-naloxone  0.5 tablet Sublingual TID  ? enoxaparin (LOVENOX) injection  40 mg Subcutaneous Daily  ? furosemide  40 mg Intravenous BID  ? nicotine  21 mg Transdermal Daily  ? ?Continuous Infusions: ? ?PRN Meds: ?acetaminophen  ? ?Vital Signs  ?  ?Vitals:  ? 02/05/22 2300 02/06/22 0200 02/06/22 0523 02/06/22 0755  ?BP: 132/87  (!) 131/103 (!) 118/104  ?Pulse: (!) 101  (!) 102 (!) 110  ?Resp: 14 12 14 20   ?Temp: 98.4 ?F (36.9 ?C)  98.2 ?F (36.8 ?C) 98.4 ?F (36.9 ?C)  ?TempSrc: Oral  Oral Oral  ?SpO2: 94% 90% 91% 91%  ?Weight:   129.8 kg   ?Height:      ? ? ?Intake/Output Summary (Last 24 hours) at 02/06/2022 0946 ?Last data filed at 02/06/2022 04/08/2022 ?Gross per 24 hour  ?Intake 570 ml  ?Output 400 ml  ?Net 170 ml  ? ? ?  02/06/2022  ?  5:23 AM 02/05/2022  ?  5:41 AM 02/04/2022  ?  7:29 PM  ?Last 3 Weights  ?Weight (lbs) 286 lb 3.2 oz 287 lb 1.6 oz 293 lb 1.6 oz  ?Weight (kg) 129.819 kg 130.228 kg 132.949 kg  ?   ? ?Telemetry  ?  ?Sinus - Personally Reviewed ? ? ? ?Physical Exam  ? ?GEN: No acute distress.   ?Neck: supple ?Cardiac: RRR ?Respiratory: Diminished BS ?GI: Soft, mildly distended; not tender, presacral edema noted ?MS: 3+ edema ?Neuro:  Nonfocal  ?Psych: Normal affect  ? ?Labs  ?  ?High Sensitivity Troponin:   ?Recent Labs  ?Lab 02/04/22 ?1541 02/04/22 ?2140  ?TROPONINIHS 8 7  ?   ?Chemistry ?Recent Labs  ?Lab 02/04/22 ?1541 02/04/22 ?2140 02/05/22 ?0140 02/06/22 ?0252  ?NA 129* 132* 134* 136  ?K 4.8 3.8 4.2 4.1  ?CL 86* 86* 86* 85*  ?CO2 34* 33* 40* 43*  ?GLUCOSE 100* 208* 136* 118*  ?BUN 8 8 7 7   ?CREATININE 0.73 0.80 0.74 0.67  ?CALCIUM 8.4* 8.8* 8.9 8.9  ?PROT 5.8* 6.7  --   --   ?ALBUMIN 2.7* 2.9*  --   --   ?AST 31 15  --   --   ?ALT 6 7  --   --   ?ALKPHOS 74 81   --   --   ?BILITOT 1.1 0.7  --   --   ?GFRNONAA >60 >60 >60 >60  ?ANIONGAP 9 13 8 8   ?  ?Lipids  ?Recent Labs  ?Lab 02/05/22 ?1006  ?CHOL 135  ?TRIG 83  ?HDL 34*  ?LDLCALC 84  ?CHOLHDL 4.0  ?  ?Hematology ?Recent Labs  ?Lab 02/04/22 ?1541  ?WBC 8.0  ?RBC 6.07*  ?HGB 15.2  ?HCT 53.5*  ?MCV 88.1  ?MCH 25.0*  ?MCHC 28.4*  ?RDW 18.6*  ?PLT 209  ? ? ?BNP ?Recent Labs  ?Lab 02/04/22 ?1541  ?BNP 207.1*  ?  ? ? ?Radiology  ?  ?04/07/22 Abdomen Limited ? ?Result Date: 02/05/2022 ?CLINICAL DATA:  Abdominal swelling small volume ascites noted on prior CT EXAM: LIMITED ABDOMEN ULTRASOUND FOR ASCITES TECHNIQUE: Limited ultrasound survey for ascites was performed in all four abdominal quadrants. COMPARISON:  11/15/2021 FINDINGS: No abdominal ascites identified. IMPRESSION: Negative for ascites Electronically Signed  By: Corlis Leak M.D.   On: 02/05/2022 09:58  ? ?DG Chest Portable 1 View ? ?Result Date: 02/04/2022 ?CLINICAL DATA:  Shortness of breath EXAM: PORTABLE CHEST 1 VIEW COMPARISON:  Radiograph 11/15/2021, chest CT 11/17/2021 FINDINGS: Unchanged cardiomegaly. There is central pulmonary vascular congestion. Probable mild edema. No focal airspace consolidation. No large pleural effusion. No visible pneumothorax. There is no acute osseous abnormality. Thoracic spondylosis. IMPRESSION: Cardiomegaly with central pulmonary vascular congestion and probable mild interstitial edema. Electronically Signed   By: Caprice Renshaw M.D.   On: 02/04/2022 15:56  ? ?ECHOCARDIOGRAM COMPLETE ? ?Result Date: 02/05/2022 ?   ECHOCARDIOGRAM REPORT   Patient Name:   Trevor Thomas Date of Exam: 02/05/2022 Medical Rec #:  [id number]     Height:       71.0 in Accession #:    [id number]    Weight:       287.1 lb Date of Birth:  1966-04-08      BSA:          2.459 m? Patient Age:    56 years      BP:           119/74 mmHg Patient Gender: M             HR:           112 bpm. Exam Location:  Inpatient Procedure: 2D Echo, 3D Echo, Color Doppler and Cardiac Doppler  Indications:    R06.02 SOB  History:        Patient has prior history of Echocardiogram examinations, most                 recent 11/16/2021. Pulmonary HTN and COPD, Signs/Symptoms:Dyspnea                 and Shortness of Breath; Risk Factors:Current Smoker and                 Hypertension. Cor pulmonale.  Sonographer:    Sheralyn Boatman RDCS Referring Phys: [id number] TODD D MCDIARMID  Sonographer Comments: Technically difficult study due to poor echo windows. Image acquisition challenging due to COPD and Image acquisition challenging due to respiratory motion. Doppler artifact due to respiratory sounds. IMPRESSIONS  1. Left ventricular ejection fraction, by estimation, is >75%. The left ventricle has hyperdynamic function. The left ventricle has no regional wall motion abnormalities. There is mild left ventricular hypertrophy. Left ventricular diastolic parameters are consistent with Grade I diastolic dysfunction (impaired relaxation). There is the interventricular septum is flattened in systole and diastole, consistent with right ventricular pressure and volume overload.  2. Right ventricular systolic function is severely reduced. The right ventricular size is severely enlarged. There is moderately elevated pulmonary artery systolic pressure.  3. Right atrial size was severely dilated.  4. A small pericardial effusion is present.  5. The mitral valve is normal in structure. No evidence of mitral valve regurgitation. No evidence of mitral stenosis.  6. The aortic valve is tricuspid. Aortic valve regurgitation is not visualized. Aortic valve sclerosis/calcification is present, without any evidence of aortic stenosis.  7. Aortic dilatation noted. There is mild dilatation of the ascending aorta, measuring 44 mm.  8. The inferior vena cava is dilated in size with <50% respiratory variability, suggesting right atrial pressure of 15 mmHg. FINDINGS  Left Ventricle: Left ventricular ejection fraction, by estimation, is >75%. The left  ventricle has hyperdynamic function. The left ventricle has no regional wall motion abnormalities. The left ventricular internal cavity size  was normal in size. There is mild left ventricular hypertrophy. The interventricular septum is flattened in systole and diastole, consistent with right ventricular pressure and volume overload. Left ventricular diastolic parameters are consistent with Grade I diastolic dysfunction (impaired relaxation). Right Ventricle: The right ventricular size is severely enlarged. Right ventricular systolic function is severely reduced. There is moderately elevated pulmonary artery systolic pressure. The tricuspid regurgitant velocity is 3.22 m/s, and with an assumed right atrial pressure of 15 mmHg, the estimated right ventricular systolic pressure is 56.5 mmHg. Left Atrium: Left atrial size was normal in size. Right Atrium: Right atrial size was severely dilated. Pericardium: A small pericardial effusion is present. Mitral Valve: The mitral valve is normal in structure. No evidence of mitral valve regurgitation. No evidence of mitral valve stenosis. Tricuspid Valve: The tricuspid valve is normal in structure. Tricuspid valve regurgitation is mild . No evidence of tricuspid stenosis. Aortic Valve: The aortic valve is tricuspid. Aortic valve regurgitation is not visualized. Aortic valve sclerosis/calcification is present, without any evidence of aortic stenosis. Pulmonic Valve: The pulmonic valve was normal in structure. Pulmonic valve regurgitation is trivial. No evidence of pulmonic stenosis. Aorta: Aortic dilatation noted. There is mild dilatation of the ascending aorta, measuring 44 mm. Venous: The inferior vena cava is dilated in size with less than 50% respiratory variability, suggesting right atrial pressure of 15 mmHg. IAS/Shunts: There is left bowing of the interatrial septum, suggestive of elevated right atrial pressure. No atrial level shunt detected by color flow Doppler.  LEFT  VENTRICLE PLAX 2D LVIDd:         4.50 cm      Diastology LVIDs:         3.20 cm      LV e' medial:    8.16 cm/s LV PW:         1.50 cm      LV E/e' medial:  8.5 LV IVS:        1.40 cm      LV e' lateral:   8.16 c

## 2022-02-06 NOTE — Hospital Course (Addendum)
Ms. Bueso is a 56 year old male admitted for acute hypoxic respiratory failure and cor pulmonale. He required ICU admission 5/08-10 for intubation for acute respiratory decompensation and encephalopathy. PMH includes hypertension, hyperlipidemia, COPD, polysubstance abuse (opioids, marijuana, tobacco), chronic pain with osteoarthritis, TBI, anxiety, hx of alcohol abuse.  ? ?Acute on Chronic hypoxic hypercapnic respiratory failure due to Pulmonary hypertension/Cor pulmonale ?56 yo man presented to [facility] on 5/5 with complaint of dyspnea, worsening leg swelling. He was initially started on diuresis and [place] oxygen for treatment with some mild results. ABG at admission showed chronically elevated pCO2 to 60s with a normal pH, which speaks to the chronicity of the problem. With a normal pH and due to patient anxiety, BiPAP not initiated. On 5/8 patient had increased lasix as only moderate diuresis achieved, and that night became obtunded and had acutely worsened pCO2 rentention to 123, pH 7.2. Patient became more alert after narcan administration and BiPAP trial initiated. Patient developed acute encephalopathy and was not able to adhere to BiPAP and required transfer to the ICU, where he had to be sedated and intubated. Patient was intubated for 2 days and transitioned to BiPAP on May 10. On May 11 he was successfully switched to PO lasix. At time of discharge, patient had dry weight of 115 kg, recommend 40mg  of lasix daily PO. Echocardiogram during admission showed severe right heart dysfunction suspicious for cor pulmonale 2/2 PAH. Cardiology consulted and recommended right heart cath to be performed. Patient greatly desired to be discharged, heart catheterization scheduled for outpatient follow up.  ? ?Hypertension ?Improved with diuresis.  Remained normotensive off of antihypertensive.  Home antihypertensives held on discharge, can follow-up with PCP and restart if appropriate. ? ?Chronic pain on suboxone   Polysubstance use ?Suboxone was restarted once mentation stabilized.  Encouraged tobacco cessation, patient seems mildly motivated to stop outpatient.   ? ?Aspiration PNA ?Has no oxygen requirement at baseline.  Required up to 13 L HFNC once extubated. Treated for aspiration pneumonia with unasyn for 6 days.  His oxygen was able to be weaned down to 1 L at the time of discharge.  He ambulated with pulse ox on day of discharge and required only 1 L of oxygen, DME order placed for home oxygen. ? ?COPD ?Given his tobacco use, suspect COPD.  He only had albuterol to use as needed prior to hospitalization.  We started him on Dulera and Incruse Ellipta. ? ?Tobacco use disorder ?Desired to cut back on smoking.  Provided with nicotine patches while hospitalized.  He was discharged with nicotine patches to continue to use.  Encouraged that he work with PCP for smoking cessation. ? ?Discharge recommendations: ?Cardiology will pursue heart cath on outpatient basis given patient stability and preference for quick discharge.  ?Blood pressure: recommend holding amlodipine-benazepril until reassessment at PCP follow up due to hypotension in ICU ?Check weight and BMP due to lasix usage ?Concern for OSA, recommend sleep study for CPAP assessment ?Patient was unclear about how much suboxone he was taking, recommend discussing with patient the importance of medications and making medication regimen very clear ?Home Health PT ?

## 2022-02-06 NOTE — Progress Notes (Signed)
Family Medicine Teaching Service ?Daily Progress Note ?Intern Pager: [phone] ? ?Patient name: Trevor Thomas Medical record number: [id number] ?Date of birth: 1966/06/28 Age: 56 y.o. Gender: male ? ?Primary Care Provider: [place] ?Consultants: Cardiology ?Code Status: Full code ? ?Pt Overview and Major Events to Date:  ?02/04/2022: Patient admitted with shortness of breath and abdominal swelling, acute hypoxic respiratory failure ? ?Assessment and Plan: ?Trevor Thomas is a 56 year old male who was admitted with shortness of breath concerning for heart failure versus COPD exacerbation.  His past medical history significant for hypertension, hyperlipidemia, COPD, polysubstance use including opioids, marijuana and tobacco, chronic pain secondary to osteoarthritis, traumatic brain injury, anxiety, GI bleed, EtOH abuse. ? ?Shortness of breath likely secondary to heart failure exacerbation ?Patient was evaluated by cardiology.  Patient continues to require additional supplemental oxygen up to 7 liters (from prevoiusly 4L) this AM. Cardiology following and increased IV lasix to 40mg  BID. UOP overnight measured 440mL and one unmeasured occurrence. Weight is 129.8 kg from 132 on admission, net fluid -24mL. Patient reports improved shortness of breath and abdominal/leg swelling. Patient did not wear CPAP overnight but states he would like to start this evening.  ?-Continue diuresis with 40 mg IV Lasix twice daily ?-Daily weights ?- strict intake and output  ?-Monitor respiratory status with oxygen saturations ?-Wean oxygen as tolerated ?- encourage CPAP nightly ? ?Hypertension ?Blood pressure normotensive for majority of evening. Noted to have elevated diastolic of [id number] this AM.  ?-Continue amlodipine 10 mg ?-Continue to hold ACE inhibitor's patient on Lasix for diuresis at this time ?-Monitor blood pressure with vital signs ? ?Polysubstance Use  ?UDS ordered. CIWA 0 in last 12 hours.  ?-f/u pending UDS  ? ?Chronic  pain ?Continue Suboxone 8-2, 0.5 tablet 3 times daily. COWS 0 for last 4 measurements.  ?-Continue Tylenol 650 mg every 6 hours as needed ? ?Tobacco use ?Patient smokes 1 packs per day for >10 years.  ?-Nicotine patch ? ? ?FEN/GI:  ?PPx: Lovenox 40 mg daily ?Dispo: Discharge pending successful diuresis and patient's ability to tolerate oral diuresis  in 2-3 days. Barriers include continued medical work-up.  ? ?Subjective:  ?Patient reports that he is feeling overall improved from a respiratory standpoint. He also reports improve LE edema and decreased abdominal swelling. He states he does not se supplemental oxygen at home.  ? ?Objective: ?Temp:  [97.2 ?F (36.2 ?C)-98.4 ?F (36.9 ?C)] 98.2 ?F (36.8 ?C) (05/07 [id number]) ?Pulse Rate:  [101-110] 102 (05/07 0523) ?Resp:  [12-20] 14 (05/07 0523) ?BP: (115-132)/(71-103) 131/103 (05/07 0523) ?SpO2:  [90 %-95 %] 91 % (05/07 0523) ?Weight:  [129.8 kg] 129.8 kg (05/07 0523) ? ?Physical Exam: ?General: male in NAD sitting upright in bed with Pollock in place  ?Cardiovascular: normal heart sounds, regular rate and rhythm, radial pulses palpable  ?Respiratory: coarse breath sound bilaterally and throughout lung fields, wheezing bilaterally, on 7 Liters via [place]  ?Abdomen: distended abdomen, non tender, BS normal  ?Extremities: bilateral LE edema  ? ?Laboratory: ?Recent Labs  ?Lab 02/04/22 ?1541  ?WBC 8.0  ?HGB 15.2  ?HCT 53.5*  ?PLT 209  ? ?Recent Labs  ?Lab 02/04/22 ?1541 02/04/22 ?2140 02/05/22 ?0140 02/06/22 ?0252  ?NA 129* 132* 134* 136  ?K 4.8 3.8 4.2 4.1  ?CL 86* 86* 86* 85*  ?CO2 34* 33* 40* 43*  ?BUN 8 8 7 7   ?CREATININE 0.73 0.80 0.74 0.67  ?CALCIUM 8.4* 8.8* 8.9 8.9  ?PROT 5.8* 6.7  --   --   ?  BILITOT 1.1 0.7  --   --   ?ALKPHOS 74 81  --   --   ?ALT 6 7  --   --   ?AST 31 15  --   --   ?GLUCOSE 100* 208* 136* 118*  ? ? ? ? ?Imaging/Diagnostic Tests: ? ? ?RUQ [place] 02/05/22: no abdominal ascites  ? ?Echo: EF >75%, LV hyperdynamic function, mild LVH, grade one diastolic dysfunction, RV  pressure overload, reduced RV function, severely enlarged RV, small pericardial effusion, aortic dilation dilated IVC ? ?Eulis Foster, MD ?02/06/2022, 6:49 AM ?PGY-3, [facility] ?[place] Intern pager: [phone], text pages welcome ? ?

## 2022-02-07 ENCOUNTER — Inpatient Hospital Stay (HOSPITAL_COMMUNITY): Payer: Medicaid Other

## 2022-02-07 ENCOUNTER — Encounter (HOSPITAL_COMMUNITY): Payer: Self-pay | Admitting: Student

## 2022-02-07 DIAGNOSIS — I50811 Acute right heart failure: Secondary | ICD-10-CM | POA: Diagnosis not present

## 2022-02-07 DIAGNOSIS — G9340 Encephalopathy, unspecified: Secondary | ICD-10-CM | POA: Diagnosis not present

## 2022-02-07 DIAGNOSIS — J9601 Acute respiratory failure with hypoxia: Secondary | ICD-10-CM | POA: Diagnosis not present

## 2022-02-07 DIAGNOSIS — J9602 Acute respiratory failure with hypercapnia: Secondary | ICD-10-CM | POA: Diagnosis not present

## 2022-02-07 DIAGNOSIS — I2609 Other pulmonary embolism with acute cor pulmonale: Secondary | ICD-10-CM | POA: Diagnosis not present

## 2022-02-07 DIAGNOSIS — I5031 Acute diastolic (congestive) heart failure: Secondary | ICD-10-CM

## 2022-02-07 LAB — POCT I-STAT 7, (LYTES, BLD GAS, ICA,H+H)
Acid-Base Excess: 23 mmol/L — ABNORMAL HIGH (ref 0.0–2.0)
Acid-Base Excess: 24 mmol/L — ABNORMAL HIGH (ref 0.0–2.0)
Bicarbonate: 52.5 mmol/L — ABNORMAL HIGH (ref 20.0–28.0)
Bicarbonate: 51.9 mmol/L — ABNORMAL HIGH (ref 20.0–28.0)
Calcium, Ion: 1.14 mmol/L — ABNORMAL LOW (ref 1.15–1.40)
Calcium, Ion: 1.14 mmol/L — ABNORMAL LOW (ref 1.15–1.40)
HCT: 55 % — ABNORMAL HIGH (ref 39.0–52.0)
HCT: 56 % — ABNORMAL HIGH (ref 39.0–52.0)
Hemoglobin: 18.7 g/dL — ABNORMAL HIGH (ref 13.0–17.0)
Hemoglobin: 19 g/dL — ABNORMAL HIGH (ref 13.0–17.0)
O2 Saturation: 85 %
O2 Saturation: 93 %
Patient temperature: 97.5
Patient temperature: 98.3
Potassium: 4 mmol/L (ref 3.5–5.1)
Potassium: 3.8 mmol/L (ref 3.5–5.1)
Sodium: 132 mmol/L — ABNORMAL LOW (ref 135–145)
Sodium: 132 mmol/L — ABNORMAL LOW (ref 135–145)
TCO2: >50 mmol/L — ABNORMAL HIGH (ref 22–32)
TCO2: >50 mmol/L — ABNORMAL HIGH (ref 22–32)
pCO2 arterial: 64.9 mmHg — ABNORMAL HIGH (ref 32–48)
pCO2 arterial: 56.5 mmHg — ABNORMAL HIGH (ref 32–48)
pH, Arterial: 7.514 — ABNORMAL HIGH (ref 7.35–7.45)
pH, Arterial: 7.571 — ABNORMAL HIGH (ref 7.35–7.45)
pO2, Arterial: 46 mmHg — ABNORMAL LOW (ref 83–108)
pO2, Arterial: 61 mmHg — ABNORMAL LOW (ref 83–108)

## 2022-02-07 LAB — CBC
HCT: 54.3 % — ABNORMAL HIGH (ref 39.0–52.0)
Hemoglobin: 14.7 g/dL (ref 13.0–17.0)
MCH: 24.7 pg — ABNORMAL LOW (ref 26.0–34.0)
MCHC: 27.1 g/dL — ABNORMAL LOW (ref 30.0–36.0)
MCV: 91.3 fL (ref 80.0–100.0)
Platelets: 153 10*3/uL (ref 150–400)
RBC: 5.95 MIL/uL — ABNORMAL HIGH (ref 4.22–5.81)
RDW: 18.1 % — ABNORMAL HIGH (ref 11.5–15.5)
WBC: 6.4 10*3/uL (ref 4.0–10.5)
nRBC: 0 % (ref 0.0–0.2)

## 2022-02-07 LAB — BASIC METABOLIC PANEL
BUN: 9 mg/dL (ref 6–20)
CO2: >45 mmol/L — ABNORMAL HIGH (ref 22–32)
Calcium: 8.9 mg/dL (ref 8.9–10.3)
Chloride: 79 mmol/L — ABNORMAL LOW (ref 98–111)
Creatinine, Ser: 0.67 mg/dL (ref 0.61–1.24)
GFR, Estimated: >60 mL/min (ref >60)
Glucose, Bld: 103 mg/dL — ABNORMAL HIGH (ref 70–99)
Potassium: 4.4 mmol/L (ref 3.5–5.1)
Sodium: 136 mmol/L (ref 135–145)

## 2022-02-07 LAB — BLOOD GAS, ARTERIAL
Acid-Base Excess: 32 mmol/L — ABNORMAL HIGH (ref 0.0–2.0)
Acid-Base Excess: 31.5 mmol/L — ABNORMAL HIGH (ref 0.0–2.0)
Bicarbonate: 65.7 mmol/L — ABNORMAL HIGH (ref 20.0–28.0)
Bicarbonate: 65.9 mmol/L — ABNORMAL HIGH (ref 20.0–28.0)
O2 Saturation: 97.3 %
O2 Saturation: 98.9 %
Patient temperature: 36.8
Patient temperature: 36.3
pCO2 arterial: 118 mmHg (ref 32–48)
pCO2 arterial: >123 mmHg (ref 32–48)
pH, Arterial: 7.35 (ref 7.35–7.45)
pH, Arterial: 7.33 — ABNORMAL LOW (ref 7.35–7.45)
pO2, Arterial: 85 mmHg (ref 83–108)
pO2, Arterial: 115 mmHg — ABNORMAL HIGH (ref 83–108)

## 2022-02-07 LAB — D-DIMER, QUANTITATIVE: D-Dimer, Quant: 0.75 ug/mL-FEU — ABNORMAL HIGH (ref 0.00–0.50)

## 2022-02-07 LAB — GLUCOSE, CAPILLARY
Glucose-Capillary: 107 mg/dL — ABNORMAL HIGH (ref 70–99)
Glucose-Capillary: 99 mg/dL (ref 70–99)
Glucose-Capillary: 129 mg/dL — ABNORMAL HIGH (ref 70–99)
Glucose-Capillary: 106 mg/dL — ABNORMAL HIGH (ref 70–99)

## 2022-02-07 LAB — MAGNESIUM: Magnesium: 1.5 mg/dL — ABNORMAL LOW (ref 1.7–2.4)

## 2022-02-07 LAB — MRSA NEXT GEN BY PCR, NASAL: MRSA by PCR Next Gen: NOT DETECTED

## 2022-02-07 MED ORDER — FENTANYL BOLUS VIA INFUSION
50.0000 ug | INTRAVENOUS | Status: DC | PRN
  Administered 2022-02-07 (×2): 100 ug via INTRAVENOUS
  Administered 2022-02-09: 50 ug via INTRAVENOUS
  Administered 2022-02-09: 100 ug via INTRAVENOUS
  Filled 2022-02-07: qty 100

## 2022-02-07 MED ORDER — ORAL CARE MOUTH RINSE
15.0000 mL | Freq: Two times a day (BID) | OROMUCOSAL | Status: DC
  Administered 2022-02-07: 15 mL via OROMUCOSAL

## 2022-02-07 MED ORDER — PROPOFOL 1000 MG/100ML IV EMUL
0.0000 ug/kg/min | INTRAVENOUS | Status: DC
  Administered 2022-02-07 (×4): 50 ug/kg/min via INTRAVENOUS
  Administered 2022-02-08: 25 ug/kg/min via INTRAVENOUS
  Administered 2022-02-08: 15 ug/kg/min via INTRAVENOUS
  Administered 2022-02-08 (×2): 20 ug/kg/min via INTRAVENOUS
  Administered 2022-02-08: 25 ug/kg/min via INTRAVENOUS
  Administered 2022-02-09: 20 ug/kg/min via INTRAVENOUS
  Filled 2022-02-07 (×9): qty 100

## 2022-02-07 MED ORDER — ORAL CARE MOUTH RINSE
15.0000 mL | OROMUCOSAL | Status: DC
  Administered 2022-02-07 – 2022-02-09 (×22): 15 mL via OROMUCOSAL

## 2022-02-07 MED ORDER — BUDESONIDE 0.25 MG/2ML IN SUSP
0.2500 mg | Freq: Two times a day (BID) | RESPIRATORY_TRACT | Status: DC
  Administered 2022-02-07 – 2022-02-08 (×3): 0.25 mg via RESPIRATORY_TRACT
  Filled 2022-02-07 (×3): qty 2

## 2022-02-07 MED ORDER — HALOPERIDOL LACTATE 5 MG/ML IJ SOLN
2.0000 mg | Freq: Once | INTRAMUSCULAR | Status: AC
  Administered 2022-02-07: 2 mg via INTRAVENOUS
  Filled 2022-02-07: qty 1

## 2022-02-07 MED ORDER — SODIUM CHLORIDE 0.9 % IV SOLN: INTRAVENOUS | Status: DC | PRN

## 2022-02-07 MED ORDER — DEXMEDETOMIDINE HCL IN NACL 400 MCG/100ML IV SOLN
0.0000 ug/kg/h | INTRAVENOUS | Status: DC
  Administered 2022-02-07: 0.6 ug/kg/h via INTRAVENOUS
  Administered 2022-02-07: 1.2 ug/kg/h via INTRAVENOUS
  Administered 2022-02-07: 0.8 ug/kg/h via INTRAVENOUS
  Filled 2022-02-07 (×3): qty 100

## 2022-02-07 MED ORDER — ARFORMOTEROL TARTRATE 15 MCG/2ML IN NEBU
15.0000 ug | INHALATION_SOLUTION | Freq: Two times a day (BID) | RESPIRATORY_TRACT | Status: DC
  Administered 2022-02-07 – 2022-02-10 (×7): 15 ug via RESPIRATORY_TRACT
  Filled 2022-02-07 (×7): qty 2

## 2022-02-07 MED ORDER — ACETAZOLAMIDE SODIUM 500 MG IJ SOLR
500.0000 mg | Freq: Once | INTRAMUSCULAR | Status: AC
  Administered 2022-02-07: 500 mg via INTRAVENOUS
  Filled 2022-02-07: qty 500

## 2022-02-07 MED ORDER — ALBUTEROL SULFATE (2.5 MG/3ML) 0.083% IN NEBU: 2.5000 mg | INHALATION_SOLUTION | RESPIRATORY_TRACT | Status: DC | PRN

## 2022-02-07 MED ORDER — ACETAMINOPHEN 325 MG PO TABS: 650.0000 mg | ORAL_TABLET | Freq: Four times a day (QID) | ORAL | Status: DC | PRN

## 2022-02-07 MED ORDER — MAGNESIUM SULFATE 4 GM/100ML IV SOLN
4.0000 g | Freq: Once | INTRAVENOUS | Status: AC
  Administered 2022-02-07: 4 g via INTRAVENOUS
  Filled 2022-02-07: qty 100

## 2022-02-07 MED ORDER — MIDAZOLAM HCL 2 MG/2ML IJ SOLN
1.0000 mg | Freq: Once | INTRAMUSCULAR | Status: AC
  Administered 2022-02-07: 1 mg via INTRAVENOUS
  Filled 2022-02-07: qty 2

## 2022-02-07 MED ORDER — MIDAZOLAM HCL 2 MG/2ML IJ SOLN
INTRAMUSCULAR | Status: AC
  Filled 2022-02-07: qty 2

## 2022-02-07 MED ORDER — HALOPERIDOL LACTATE 5 MG/ML IJ SOLN
INTRAMUSCULAR | Status: AC
  Filled 2022-02-07: qty 1

## 2022-02-07 MED ORDER — ACETAZOLAMIDE SODIUM 500 MG IJ SOLR
250.0000 mg | Freq: Two times a day (BID) | INTRAMUSCULAR | Status: AC
  Administered 2022-02-07 – 2022-02-08 (×3): 250 mg via INTRAVENOUS
  Filled 2022-02-07 (×4): qty 250

## 2022-02-07 MED ORDER — REVEFENACIN 175 MCG/3ML IN SOLN
175.0000 ug | Freq: Every day | RESPIRATORY_TRACT | Status: DC
  Administered 2022-02-07 – 2022-02-10 (×4): 175 ug via RESPIRATORY_TRACT
  Filled 2022-02-07 (×4): qty 3

## 2022-02-07 MED ORDER — POLYETHYLENE GLYCOL 3350 17 G PO PACK
17.0000 g | PACK | Freq: Every day | ORAL | Status: DC
  Administered 2022-02-08 – 2022-02-09 (×2): 17 g
  Filled 2022-02-07 (×2): qty 1

## 2022-02-07 MED ORDER — FENTANYL CITRATE (PF) 100 MCG/2ML IJ SOLN: 50.0000 ug | Freq: Once | INTRAMUSCULAR | Status: DC

## 2022-02-07 MED ORDER — CHLORHEXIDINE GLUCONATE 0.12% ORAL RINSE (MEDLINE KIT)
15.0000 mL | Freq: Two times a day (BID) | OROMUCOSAL | Status: DC
  Administered 2022-02-07 – 2022-02-10 (×6): 15 mL via OROMUCOSAL

## 2022-02-07 MED ORDER — ENOXAPARIN SODIUM 40 MG/0.4ML IJ SOSY: 40.0000 mg | PREFILLED_SYRINGE | INTRAMUSCULAR | Status: DC

## 2022-02-07 MED ORDER — IOHEXOL 350 MG/ML SOLN
100.0000 mL | Freq: Once | INTRAVENOUS | Status: AC | PRN
  Administered 2022-02-07: 100 mL via INTRAVENOUS

## 2022-02-07 MED ORDER — FENTANYL 2500MCG IN NS 250ML (10MCG/ML) PREMIX INFUSION
0.0000 ug/h | INTRAVENOUS | Status: DC
  Administered 2022-02-07: 225 ug/h via INTRAVENOUS
  Administered 2022-02-07: 75 ug/h via INTRAVENOUS
  Administered 2022-02-08: 100 ug/h via INTRAVENOUS
  Filled 2022-02-07 (×3): qty 250

## 2022-02-07 MED ORDER — ENOXAPARIN SODIUM 60 MG/0.6ML IJ SOSY
60.0000 mg | PREFILLED_SYRINGE | INTRAMUSCULAR | Status: DC
  Administered 2022-02-07 – 2022-02-11 (×5): 60 mg via SUBCUTANEOUS
  Filled 2022-02-07 (×6): qty 0.6

## 2022-02-07 MED ORDER — ARFORMOTEROL TARTRATE 15 MCG/2ML IN NEBU: 15.0000 ug | INHALATION_SOLUTION | Freq: Two times a day (BID) | RESPIRATORY_TRACT | Status: DC

## 2022-02-07 MED ORDER — CHLORHEXIDINE GLUCONATE 0.12 % MT SOLN
15.0000 mL | Freq: Two times a day (BID) | OROMUCOSAL | Status: DC
  Administered 2022-02-07 (×2): 15 mL via OROMUCOSAL
  Filled 2022-02-07: qty 15

## 2022-02-07 MED ORDER — IPRATROPIUM-ALBUTEROL 0.5-2.5 (3) MG/3ML IN SOLN
3.0000 mL | Freq: Four times a day (QID) | RESPIRATORY_TRACT | Status: DC
  Administered 2022-02-07: 3 mL via RESPIRATORY_TRACT
  Filled 2022-02-07: qty 3

## 2022-02-07 MED ORDER — FENTANYL CITRATE (PF) 100 MCG/2ML IJ SOLN
50.0000 ug | Freq: Once | INTRAMUSCULAR | Status: AC
  Administered 2022-02-07: 50 ug via INTRAVENOUS
  Filled 2022-02-07: qty 2

## 2022-02-07 MED ORDER — DOCUSATE SODIUM 50 MG/5ML PO LIQD
100.0000 mg | Freq: Two times a day (BID) | ORAL | Status: DC
  Administered 2022-02-07 – 2022-02-08 (×3): 100 mg
  Filled 2022-02-07 (×4): qty 10

## 2022-02-07 MED ORDER — AMLODIPINE BESYLATE 10 MG PO TABS: 10.0000 mg | ORAL_TABLET | Freq: Every day | ORAL | Status: DC

## 2022-02-07 MED ORDER — ETOMIDATE 2 MG/ML IV SOLN
20.0000 mg | Freq: Once | INTRAVENOUS | Status: AC
Start: 2022-02-07 — End: 2022-02-07
  Administered 2022-02-07: 20 mg via INTRAVENOUS
  Filled 2022-02-07: qty 10

## 2022-02-07 MED ORDER — SUCCINYLCHOLINE CHLORIDE 200 MG/10ML IV SOSY
100.0000 mg | PREFILLED_SYRINGE | Freq: Once | INTRAVENOUS | Status: AC
  Administered 2022-02-07: 100 mg via INTRAVENOUS
  Filled 2022-02-07: qty 10

## 2022-02-07 NOTE — Progress Notes (Signed)
Discussed care plan and transfer to ICU tonight with patient's significant other, Otho Najjar. ? ?Shirlean Mylar, MD ?[facility] Family Medicine Residency, PGY-3 ? ?

## 2022-02-07 NOTE — Progress Notes (Addendum)
The patient was evaluated by me this morning. He remains somnolent. ?Exam:  ?Neuro: Somnolent. ?Heart: S1 S2 normal, no murmur. RRR ?Lungs: Air entry equal. Unable to adequately assess due to positioning. ?Abd: Obese, soft, +BS ?Ext: ++ edema up to his thighs ? ?A/P: 22 with AMS and acute respiratory failure. ?Now under ICU care - FMTS will resume care when transferred. ? ?Acute respiratory failure with hypoxia and hypercarbia  ?No documented hx of home O2 requirement. ?ABG reviewed. Chest X-ray shows overt pulmonary edema ?Differentials include severe COPD exacerbation vs. Diastolic CHF exacerbation vs. PE ?ICU attending ordered D-dimer, which came back positive. ?I will defer PE evaluation to the ICU team for now. FMTS resident advised to f/u with the ICU team on this.  ?Cardiology is on board with diuresis and monitoring his hydration status. ?COPD management per ICU team. ?FMTS team will resume care once he is transferred back to [place]. ? ? ?Acute metabolic encephalopathy ?Likely related to his hypoxia and hypercarbia ?Management per ICU team ?

## 2022-02-07 NOTE — TOC CAGE-AID Note (Signed)
Transition of Care (TOC) - CAGE-AID Screening ? ? ?Patient Details  ?Name: Trevor Thomas ?MRN: [id number] ?Date of Birth: 1966-06-22 ? ?Transition of Care (TOC) CM/SW Contact:    ?Catalina Pizza [name], LCSWA ?Phone Number: ?02/07/2022, 2:01 PM ? ? ?Clinical Narrative: ?CSW unable to complete CAGE AID.  Per floor RN, the patient was just sedated and is unable to hold a conversation.   ? ? ?CAGE-AID Screening: ?Substance Abuse Screening unable to be completed due to: : Patient unable to participate ? ?  ?  ?  ?  ?  ? ?  ? ?  ? ? ? ? ? ? ?

## 2022-02-07 NOTE — Assessment & Plan Note (Signed)
Due to right heart failure. ? ?-Furosemide 80 twice daily ?

## 2022-02-07 NOTE — Progress Notes (Addendum)
? ?NAME:  Trevor Thomas, MRN:  [id number], DOB:  Sep 03, 1966, LOS: 2 ?ADMISSION DATE:  02/04/2022, CONSULTATION DATE:  5/7 ?REFERRING MD:  Dr. Leary Roca, CHIEF COMPLAINT:  Acute Respiratory Failure w/ hypoxia and hypercarbia  ? ?History of Present Illness:  ?56 year old male with pertinent PMH of COPD, tobacco abuse, HFpEF, polysubstance abuse, chronic pain syndrome on Suboxone presents to [facility] ED on 5/5 with SOB. ? ?On 5/5 patient presented to [facility] with progressive SOB and increased leg swelling.  Patient's vital stable.  Afebrile.  Sats 99% on [place].  Does not wear O2 at home.  WBC WNL.  BNP 207.  VBG 7.39, 72, 46, 44 (likely chronic hypercarbia).  Patient admitted to floors with family medicine for likely CHF exacerbation.  Patient refused BiPAP due to anxiety.  Was stable on [place].  Started on IV Lasix.  Started on home Suboxone. ? ?On 5/6, Echo performed showing EF greater than 75%; grade 1 diastolic dysfunction; cor pulmonale.  Cards consulted.  On 5/7 cards increase Lasix from 40 twice daily to 80 twice daily due to low UOP.  Later in the evening, patient very obtunded and hypoxic.  GCS initially less than 8.  Placed on 15 L salter HFNC.  Rapid response called.  Patient given Narcan with improvement in mental status.  ABG drawn: pH 7.2, PCO2 123, PaO2 61.  Patient placed on BiPAP.  Given additional 40 Lasix.  CXR showing increased infiltrates in RLL possible aspiration.  Started on Unasyn.  UDS pending. ? ?Pertinent  Medical History  ?ETOH Abuse ?Polysubstance Abuse  ?Wears Dentures  ?Chronic Pain - on suboxone  ?GIB  ?COPD  ?Depression / Anxiety  ?Prior Suicidal Ideation ?Anisocoria  ?DDD / Osteoarthritis  ? ?Significant Hospital Events: ?Including procedures, antibiotic start and stop dates in addition to other pertinent events   ?5/5 Admitted to [facility] for SOB ?5/7 PCCM consulted for acute encephalopathy & respiratory distress.  Pt placed on BiPAP & given Narcan. ?5/8 ABG compensated, PCO2 118 ? ?Interim History /  Subjective:  ?Afebrile  ?On BiPAP - 80% fiO2 > ABG 7.35 / 118 / 85 / 65  ?I/O 3.1L UOP, -2.4L in last 24 hours  ? ?Objective   ?Blood pressure 116/67, pulse 97, temperature 97.8 ?F (36.6 ?C), temperature source Axillary, resp. rate 16, height 5\' 11"  (1.803 m), weight 125.7 kg, SpO2 97 %. ?   ?FiO2 (%):  [80 %] 80 %  ? ?Intake/Output Summary (Last 24 hours) at 02/07/2022 0736 ?Last data filed at 02/07/2022 0536 ?Gross per 24 hour  ?Intake 698.69 ml  ?Output 3130 ml  ?Net -2431.31 ml  ? ?Filed Weights  ? 02/05/22 0541 02/06/22 0523 02/07/22 0131  ?Weight: 130.2 kg 129.8 kg 125.7 kg  ? ? ?Examination: ?General: adult male lying in bed on BiPAP in NAD ?HEENT: MM pink/moist, biPAP mask in place, anicteric  ?Neuro: Awakens to voice, follows commands ?CV: s1s2 RRR, ST 110's, no m/r/g ?PULM: non-labored on BiPAP with good volumes, lungs diminished bilaterally with soft wheeze ?GI: soft, bsx4 active  ?Extremities: warm/dry, 2+ pre-tibial edema up to knees  ?Skin: no rashes or lesions ? ?Resolved Hospital Problem list   ? ? ?Assessment & Plan:  ? ?Acute on chronic respiratory failure withy hypoxia and hypercarbia: COPD vs. CHF exacerbation ?Hx of COPD: no PFTs on file; only on PRN albuterol ?Possible aspiration pneumonia ?Possible hx of OSA: non compliant with bipap ?THC Abuse  ?-continue BiPAP support for now  ?-wean O2 for sats 88-94% ?-ok to  transition to [place] when patient requests to come off bipap this am  ?-follow CXR  ?-continue empiric unasyn for possible aspiration  ?-change duoneb to Q6  ?-lasix 80 mg BID  ?-pulmonary hygiene -IS, mobilize, flutter  ?-no indication for intubation at this time  ?-aspiration precautions  ? ?Acute Metabolic Encephalopathy ?Suspect in setting of hypercarbia; thought related to suboxone; improved with Narcan. UDS positive for THC.  ?-BiPAP QHS & PRN  ?-limit sedating medications as able  ?-PT efforts  ? ?Cor Pulmonale ?Diastolic CHF ?HTN ?-appreciate Cardiology ?-lasix per Cards ?-follow  I/O's, daily weights ?-acetazolamide x1  ?-check Mg+ with diuresis  ? ?Hx of polysubstance use ?Chronic Pain ?-hold Suboxone for now with encephalopathy  ?-PRN tylenol for pain  ? ?Tobacco absue ?-cessation counseling  ?-nicotine patch  ? ? ?Best Practice (right click and "Reselect all SmartList Selections" daily)  ?Per primary ? ?  ? ? ?Canary Brim, MSN, APRN, NP-C, AGACNP-BC ?[facility] ?02/07/2022, 7:37 AM ? ? ?Please see [email or web address] for pager details.  ? ?From 7A-7P if no response, please call [phone] ?After hours, please call Pola Corn [phone] ? ? ? ?

## 2022-02-07 NOTE — Progress Notes (Signed)
PCCM called to assess patient for frequent desaturation while on bipap. Nurse states that sats drop to 70s when patient is asleep and slumped over but increase to 90s when patient is awake. This has happened multiple times in the past few hours. Patient mental status is much improved.  ? ?P: ?-Will transfer patient to ICU for closer monitoring.  ?-Increase EPAP.  ?-Will shave facial hair and try different size bipap mask due to significant air leak.  ?-ABG in am ? ?Patsy Lager, PA-C ?[facility] ?02/07/2022, 12:55 AM ? ?Please see [email or web address] for pager details. ? ?From 7A-7P if no response, please call [phone]. ?After hours, please call ELink [phone]. ? ?

## 2022-02-07 NOTE — Assessment & Plan Note (Addendum)
No wheezing on examination ? ?-Continue bronchodilators, inhaled steroids. ?-Avoid IV steroids for now given volume overload. ?

## 2022-02-07 NOTE — Assessment & Plan Note (Addendum)
Acute on chronic hypercarbic respiratory failure.  Normal pH with elevated CO2 speaks to chronicity. ?Unable to adequately correct with BiPAP as patient noncompliant.  ?O2 has corrected promptly with mechanical ventilation. ?Decreasing oxygen requirements, but saturations remain marginal. ? ?-Decrease sedation and commence SBT. ?-Wean PEEP to 5.  Maintain sats greater than 88%.  Patient is likely chronically hypoxic at home. ?

## 2022-02-07 NOTE — Progress Notes (Signed)
Initial Nutrition Assessment ? ?DOCUMENTATION CODES:  ? ?Not applicable ? ?INTERVENTION:  ? ?- Pt not appropriate for diet education at this time; RD will plan to provide low sodium diet education at follow up ? ?- RD will monitor for diet advancement and provide oral nutrition supplements as appropriate ? ?NUTRITION DIAGNOSIS:  ? ?Inadequate oral intake related to inability to eat as evidenced by NPO status. ? ?GOAL:  ? ?Patient will meet greater than or equal to 90% of their needs ? ?MONITOR:  ? ?Diet advancement, Labs, Weight trends, I & O's ? ?REASON FOR ASSESSMENT:  ? ?Consult ?Diet education ? ?ASSESSMENT:  ? ?56 year old male who presented to the ED on 5/05 with SOB. PMH of HTN, HLD, COPD, polysubstance abuse, EtOH abuse, chronic pain with osteoarthritis, TBI, anxiety, GI bleed. Pt admitted with acute hypoxic respiratory failure likely secondary to CHF vs COPD exacerbation. ? ?05/07 - rapid response ?05/08 - transferred to ICU ? ?RD working remotely. Unable to reach pt via phone call to room. Pt on and off BiPAP per notes. ? ?Consult received for low sodium diet education. Pt is not appropriate for diet education at this time due to lethargy, agitation, and intermittent BiPAP requirement. RD will follow up for ability to provide low sodium diet education at a later date. ? ?Pt currently NPO due to requiring BiPAP. Pt previously on a Heart Healthy diet with meal completions 50-100%. RD will follow for diet advancement and order oral nutrition supplements as appropriate. ? ?Admit weight: 132.9 kg ?Current weight: 125.7 kg ? ?Pt continues to diurese and currently with moderate pitting edema to BLE per  ? ?Meal Completion: 50-100% x last 4 recorded meals ? ?Medications reviewed and include: IV lasix, miralax, precedex drip, IV magnesium sulfate 4 grams once ? ?Labs reviewed: magnesium 1.5 ?CBG's: 107 ? ?UOP: 3240 ml x 24 hours ?I/O's: -4.5 L since admit ? ?NUTRITION - FOCUSED PHYSICAL EXAM: ? ?Unable to complete  at this time. RD working remotely. ? ?Diet Order:   ?Diet Order   ? ?       ?  Diet NPO time specified  Diet effective now       ?  ? ?  ?  ? ?  ? ? ?EDUCATION NEEDS:  ? ?Not appropriate for education at this time ? ?Skin:  Skin Assessment: Reviewed RN Assessment (MASD coccyx) ? ?Last BM:  02/04/22 ? ?Height:  ? ?Ht Readings from Last 1 Encounters:  ?02/04/22 5\' 11"  (1.803 m)  ? ? ?Weight:  ? ?Wt Readings from Last 1 Encounters:  ?02/07/22 125.7 kg  ? ? ?BMI:  Body mass index is 38.65 kg/m?. ? ?Estimated Nutritional Needs:  ? ?Kcal:  2200-2400 ? ?Protein:  110-130 grams ? ?Fluid:  2.0 L ? ? ? ?Gustavus Bryant, MS, RD, LDN ?Inpatient Clinical Dietitian ?Please see AMiON for contact information. ? ?

## 2022-02-07 NOTE — Progress Notes (Addendum)
eLink Physician-Brief Progress Note ?Patient Name: Trevor Thomas ?DOB: 09-Nov-1965 ?MRN: [id number] ? ? ?Date of Service ? 02/07/2022  ?HPI/Events of Note ? Moved to ICU from floor for worsening encephalopathy, type 2 acute on chronic hypercarbic failure for BiPAP. ?Hx of chronic oioid disorder on suboxone, morbid obesity.  ? ?Camera: ?Morbidly obese. 18/10/16/80% , Ve 10 lit ?Restless minimal. ?Sinus tachy at 106, 98% sats. 127/111 ? ? ?Data: ?Reviewed ?Cr normal ?CBC normal ?THC +. ?EKG: sinus tachy, S1, no q1 or T3 . Qtc 460. ?Trop x 2 neg. BNP low at 207. ? ?A/P: ? ?Acute on chronic type 2 resp failure from CHF exacerbation/OSA-OHS clinically. Chronic RV failure-cor . ?  ?encephalopathy ? ?Poly substance /tobacco abuse hx ? ?Chronic pain ?  ?  ?eICU Interventions ? Continue Unasyn, lasix, nebs. ?- asp precautions ?- CBG < 180 ?- VTE: ordered lovenox SQ.  ?- not on steroids. To consider if has wheezing. ?- consider precedex if getting anxious and restless while on BiPAP. Follow ABG ?- if worsening hypercarbia, lethargy- to consider intubation.   ? ? ? ?Intervention Category ?Major Interventions: Respiratory failure - evaluation and management ?Evaluation Type: New Patient Evaluation ? ?Ranee Gosselin ?02/07/2022, 1:31 AM ? ?3:10 ?D dimer mildly elevated. Will not go for CTPA at this time. ?Hypo active bowels per RN. ?Get KUB.follow ABG.  ? ?6:34 ?KUB. Neg for any ileus or free air ?ABG, improving type 2 resp acidosis.  ?Continue care.  ? ?

## 2022-02-07 NOTE — Progress Notes (Signed)
RT note. ?Patient continues to remove bipap mask. Attempt to place bipap back on patient, patient continues to rip mask off. Patient placed back on 10L salter. RN aware, Dr. Loni Muse aware. RT will continue to monitor,. ?

## 2022-02-07 NOTE — Assessment & Plan Note (Addendum)
JVP not elevated, bilateral interstitial infiltrates on chest x-ray with widened vascular pedicle.  Bilateral leg edema much improved ? ?-Decrease furosemide.  Acetazolamide for metabolic alkalosis. ?

## 2022-02-07 NOTE — Progress Notes (Signed)
RT note. Patient transported to CT and back without any complications. 

## 2022-02-07 NOTE — Plan of Care (Signed)
?Problem: Clinical Measurements: ?Goal: Respiratory complications will improve ?02/07/2022 0833 by Conley Simmonds, RN ?Outcome: Progressing ?02/07/2022 0832 by Conley Simmonds, RN ?Outcome: Progressing ?  ?Problem: Respiratory: ?Goal: Ability to maintain a clear airway will improve ?02/07/2022 0833 by Conley Simmonds, RN ?Outcome: Progressing ?02/07/2022 0832 by Conley Simmonds, RN ?Outcome: Not Progressing ?Goal: Levels of oxygenation will improve ?02/07/2022 0833 by Conley Simmonds, RN ?Outcome: Progressing ?02/07/2022 0832 by Conley Simmonds, RN ?Outcome: Not Progressing ?Goal: Ability to maintain adequate ventilation will improve ?02/07/2022 0833 by Conley Simmonds, RN ?Outcome: Progressing ?02/07/2022 0832 by Conley Simmonds, RN ?Outcome: Not Progressing ?  ?Problem: Education: ?Goal: Knowledge of General Education information will improve ?Description: Including pain rating scale, medication(s)/side effects and non-pharmacologic comfort measures ?02/07/2022 0833 by Conley Simmonds, RN ?Outcome: Not Progressing ?02/07/2022 0832 by Conley Simmonds, RN ?Outcome: Not Progressing ?  ?Problem: Health Behavior/Discharge Planning: ?Goal: Ability to manage health-related needs will improve ?02/07/2022 0833 by Conley Simmonds, RN ?Outcome: Not Progressing ?02/07/2022 0832 by Conley Simmonds, RN ?Outcome: Not Progressing ?  ?Problem: Clinical Measurements: ?Goal: Ability to maintain clinical measurements within normal limits will improve ?02/07/2022 0833 by Conley Simmonds, RN ?Outcome: Not Progressing ?02/07/2022 0832 by Conley Simmonds, RN ?Outcome: Not Progressing ?Goal: Will remain free from infection ?02/07/2022 0833 by Conley Simmonds, RN ?Outcome: Not Progressing ?02/07/2022 0832 by Conley Simmonds, RN ?Outcome: Not Progressing ?Goal: Diagnostic test results will improve ?02/07/2022 0833 by Conley Simmonds, RN ?Outcome: Not Progressing ?02/07/2022 0832 by Conley Simmonds, RN ?Outcome: Not  Progressing ?Goal: Cardiovascular complication will be avoided ?02/07/2022 0833 by Conley Simmonds, RN ?Outcome: Not Progressing ?02/07/2022 0832 by Conley Simmonds, RN ?Outcome: Progressing ?  ?Problem: Activity: ?Goal: Risk for activity intolerance will decrease ?02/07/2022 0833 by Conley Simmonds, RN ?Outcome: Not Progressing ?02/07/2022 0832 by Conley Simmonds, RN ?Outcome: Not Progressing ?  ?Problem: Nutrition: ?Goal: Adequate nutrition will be maintained ?02/07/2022 0833 by Conley Simmonds, RN ?Outcome: Not Progressing ?02/07/2022 0832 by Conley Simmonds, RN ?Outcome: Not Progressing ?  ?Problem: Coping: ?Goal: Level of anxiety will decrease ?02/07/2022 0833 by Conley Simmonds, RN ?Outcome: Not Progressing ?02/07/2022 0832 by Conley Simmonds, RN ?Outcome: Not Progressing ?  ?Problem: Elimination: ?Goal: Will not experience complications related to bowel motility ?02/07/2022 0833 by Conley Simmonds, RN ?Outcome: Not Progressing ?02/07/2022 0832 by Conley Simmonds, RN ?Outcome: Not Progressing ?Goal: Will not experience complications related to urinary retention ?02/07/2022 0833 by Conley Simmonds, RN ?Outcome: Not Progressing ?02/07/2022 0832 by Conley Simmonds, RN ?Outcome: Not Progressing ?  ?Problem: Pain Managment: ?Goal: General experience of comfort will improve ?02/07/2022 0833 by Conley Simmonds, RN ?Outcome: Not Progressing ?02/07/2022 0832 by Conley Simmonds, RN ?Outcome: Not Progressing ?  ?Problem: Safety: ?Goal: Ability to remain free from injury will improve ?02/07/2022 0833 by Conley Simmonds, RN ?Outcome: Not Progressing ?02/07/2022 0832 by Conley Simmonds, RN ?Outcome: Not Progressing ?  ?Problem: Skin Integrity: ?Goal: Risk for impaired skin integrity will decrease ?02/07/2022 0833 by Conley Simmonds, RN ?Outcome: Not Progressing ?02/07/2022 0832 by Conley Simmonds, RN ?Outcome: Not Progressing ?  ?Problem: Education: ?Goal: Ability to demonstrate management of disease  process will improve ?02/07/2022 0833 by Conley Simmonds, RN ?Outcome: Not Progressing ?02/07/2022 0832 by Conley Simmonds, RN ?Outcome: Not Progressing ?Goal: Ability to verbalize understanding of medication therapies will improve ?02/07/2022 0833 by  Conley Simmonds, RN ?Outcome: Not Progressing ?02/07/2022 0832 by Conley Simmonds, RN ?Outcome: Not Progressing ?Goal: Individualized Educational Video(s) ?02/07/2022 0833 by Conley Simmonds, RN ?Outcome: Not Progressing ?02/07/2022 0832 by Conley Simmonds, RN ?Outcome: Not Progressing ?  ?Problem: Activity: ?Goal: Capacity to carry out activities will improve ?02/07/2022 0833 by Conley Simmonds, RN ?Outcome: Not Progressing ?02/07/2022 0832 by Conley Simmonds, RN ?Outcome: Not Progressing ?  ?Problem: Cardiac: ?Goal: Ability to achieve and maintain adequate cardiopulmonary perfusion will improve ?02/07/2022 0833 by Conley Simmonds, RN ?Outcome: Not Progressing ?02/07/2022 0832 by Conley Simmonds, RN ?Outcome: Not Progressing ?  ?Problem: Education: ?Goal: Knowledge of disease or condition will improve ?02/07/2022 0833 by Conley Simmonds, RN ?Outcome: Not Progressing ?02/07/2022 0832 by Conley Simmonds, RN ?Outcome: Not Progressing ?Goal: Knowledge of the prescribed therapeutic regimen will improve ?02/07/2022 0833 by Conley Simmonds, RN ?Outcome: Not Progressing ?02/07/2022 0832 by Conley Simmonds, RN ?Outcome: Not Progressing ?Goal: Individualized Educational Video(s) ?02/07/2022 0833 by Conley Simmonds, RN ?Outcome: Not Progressing ?02/07/2022 0832 by Conley Simmonds, RN ?Outcome: Not Progressing ?  ?Problem: Activity: ?Goal: Ability to tolerate increased activity will improve ?02/07/2022 0833 by Conley Simmonds, RN ?Outcome: Not Progressing ?02/07/2022 0832 by Conley Simmonds, RN ?Outcome: Not Progressing ?Goal: Will verbalize the importance of balancing activity with adequate rest periods ?02/07/2022 0833 by Conley Simmonds, RN ?Outcome: Not  Progressing ?02/07/2022 0832 by Conley Simmonds, RN ?Outcome: Not Progressing ?  ?

## 2022-02-07 NOTE — Procedures (Signed)
Intubation Procedure Note ? ?Trevor Thomas  ?EK:[id number]  ?June 09, 1966 ? ?Date:02/07/22  ?Time:2:15 PM  ? ?Provider Performing:Twilla Khouri  ? ? ?Procedure: Intubation ([id number]) ? ?Indication(s) ?Respiratory Failure ? ?Consent ?Unable to obtain consent due to emergent nature of procedure. ? ? ?Anesthesia ?Etomidate, Versed, Fentanyl, and Succinylcholine ? ? ?Time Out ?Verified patient identification, verified procedure, site/side was marked, verified correct patient position, special equipment/implants available, medications/allergies/relevant history reviewed, required imaging and test results available. ? ? ?Sterile Technique ?Usual hand hygeine, masks, and gloves were used ? ? ?Procedure Description ?Patient positioned in bed supine.  Sedation given as noted above.  Patient was intubated with endotracheal tube using Glidescope.  View was Grade 1 full glottis .  Number of attempts was 1.  Colorimetric CO2 detector was consistent with tracheal placement. ? ? ?Complications/Tolerance ?None; patient tolerated the procedure well. ?Chest X-ray is ordered to verify placement. ? ? ?Kipp Brood, MD FRCPC ?ICU Physician ?[place]  ?Pager: [phone] ?Or Epic Secure Chat ?After hours: [phone]. ? ?02/07/2022, 2:17 PM ? ? ? ? ? ?

## 2022-02-07 NOTE — Plan of Care (Signed)
?  Problem: Clinical Measurements: ?Goal: Ability to maintain clinical measurements within normal limits will improve ?Outcome: Not Progressing ?Goal: Respiratory complications will improve ?Outcome: Not Progressing ?  ?Problem: Elimination: ?Goal: Will not experience complications related to urinary retention ?Outcome: Not Progressing ?  ?

## 2022-02-07 NOTE — Progress Notes (Addendum)
? ?Progress Note ? ?Patient Name: Trevor Thomas ?Date of Encounter: 02/07/2022 ? ?Primary Cardiologist: Olga Millers, MD ? ?Subjective  ? ?Events of overnight reviewed, requiring BiPAP briefly, transitioned back to HFNC however this morning the patient is nodding off frequently during the interview. Nurse indicates she spoke with PCCM who will reassess for potential intermittent BiPAP. ? ?Inpatient Medications  ?  ?Scheduled Meds: ? amLODipine  10 mg Oral Daily  ? chlorhexidine  15 mL Mouth Rinse BID  ? Chlorhexidine Gluconate Cloth  6 each Topical Daily  ? enoxaparin (LOVENOX) injection  60 mg Subcutaneous Q24H  ? furosemide  80 mg Intravenous BID  ? ipratropium-albuterol  3 mL Nebulization Q6H  ? mouth rinse  15 mL Mouth Rinse q12n4p  ? nicotine  21 mg Transdermal Daily  ? polyethylene glycol  17 g Oral Daily  ? ?Continuous Infusions: ? ampicillin-sulbactam (UNASYN) IV 3 g (02/07/22 0914)  ? ondansetron (ZOFRAN) IV    ? ?PRN Meds: ?acetaminophen, naLOXone (NARCAN)  injection, ondansetron **OR** ondansetron **OR** ondansetron (ZOFRAN) IV  ? ?Vital Signs  ?  ?Vitals:  ? 02/07/22 0700 02/07/22 0718 02/07/22 0740 02/07/22 0849  ?BP: 116/67     ?Pulse: 97  96   ?Resp: 16  16   ?Temp:  97.8 ?F (36.6 ?C)    ?TempSrc:  Axillary    ?SpO2: 97%  96% 90%  ?Weight:      ?Height:      ? ? ?Intake/Output Summary (Last 24 hours) at 02/07/2022 0916 ?Last data filed at 02/07/2022 0700 ?Gross per 24 hour  ?Intake 578.69 ml  ?Output 3240 ml  ?Net -2661.31 ml  ? ? ?  02/07/2022  ?  1:31 AM 02/06/2022  ?  5:23 AM 02/05/2022  ?  5:41 AM  ?Last 3 Weights  ?Weight (lbs) 277 lb 1.9 oz 286 lb 3.2 oz 287 lb 1.6 oz  ?Weight (kg) 125.7 kg 129.819 kg 130.228 kg  ?  ? ?Telemetry  ?  ?NSR/ST - Personally Reviewed ? ?Physical Exam  ? ?GEN: No acute distress.  ?HEENT: Normocephalic, atraumatic, sclera non-icteric. ?Neck: No JVD or bruits. ?Cardiac: RRR/borderline tachycardic no murmurs, rubs, or gallops.  ?Respiratory: Coarse bilaterally with periodic  scattered wheezing, decreased BS throughout.  ?GI: Rounded/distended, +BS ?MS: no acute deformity. ?Extremities: No clubbing or cyanosis. Marked bilateral LE edema ?Neuro:  A+O to self, place, falls asleep frequently, does not know the date ?Psych:  Flat affect ? ?Labs  ?  ?High Sensitivity Troponin:   ?Recent Labs  ?Lab 02/04/22 ?1541 02/04/22 ?2140  ?TROPONINIHS 8 7  ?   ? ?Cardiac EnzymesNo results for input(s): TROPONINI in the last 168 hours. No results for input(s): TROPIPOC in the last 168 hours.  ? ?Chemistry ?Recent Labs  ?Lab 02/04/22 ?1541 02/04/22 ?2140 02/05/22 ?0140 02/06/22 ?0252 02/07/22 ?0301  ?NA 129* 132* 134* 136 136  ?K 4.8 3.8 4.2 4.1 4.4  ?CL 86* 86* 86* 85* 79*  ?CO2 34* 33* 40* 43* >45*  ?GLUCOSE 100* 208* 136* 118* 103*  ?BUN 8 8 7 7 9   ?CREATININE 0.73 0.80 0.74 0.67 0.67  ?CALCIUM 8.4* 8.8* 8.9 8.9 8.9  ?PROT 5.8* 6.7  --   --   --   ?ALBUMIN 2.7* 2.9*  --   --   --   ?AST 31 15  --   --   --   ?ALT 6 7  --   --   --   ?ALKPHOS 74 81  --   --   --   ?  BILITOT 1.1 0.7  --   --   --   ?GFRNONAA >60 >60 >60 >60 >60  ?ANIONGAP 9 13 8 8  NOT CALCULATED  ?  ? ?Hematology ?Recent Labs  ?Lab 02/04/22 ?1541 02/06/22 ?2253 02/07/22 ?04/09/22  ?WBC 8.0 5.9 6.4  ?RBC 6.07* 6.03* 5.95*  ?HGB 15.2 14.7 14.7  ?HCT 53.5* 55.5* 54.3*  ?MCV 88.1 92.0 91.3  ?MCH 25.0* 24.4* 24.7*  ?MCHC 28.4* 26.5* 27.1*  ?RDW 18.6* 18.1* 18.1*  ?PLT 209 192 153  ? ? ?BNP ?Recent Labs  ?Lab 02/04/22 ?1541  ?BNP 207.1*  ?  ? ?DDimer  ?Recent Labs  ?Lab 02/07/22 ?0218  ?DDIMER 0.75*  ?  ? ?Radiology  ?  ?DG Abd 1 View ? ?Result Date: 02/07/2022 ?CLINICAL DATA:  Bowel dysfunction EXAM: ABDOMEN - 1 VIEW COMPARISON:  None Available. FINDINGS: The bowel gas pattern is normal. No radio-opaque calculi or other significant radiographic abnormality are seen. IMPRESSION: Negative. Electronically Signed   By: 04/09/2022 M.D.   On: 02/07/2022 03:38  ? ?04/09/2022 Abdomen Limited ? ?Result Date: 02/05/2022 ?CLINICAL DATA:  Abdominal swelling small  volume ascites noted on prior CT EXAM: LIMITED ABDOMEN ULTRASOUND FOR ASCITES TECHNIQUE: Limited ultrasound survey for ascites was performed in all four abdominal quadrants. COMPARISON:  11/15/2021 FINDINGS: No abdominal ascites identified. IMPRESSION: Negative for ascites Electronically Signed   By: 11/17/2021 M.D.   On: 02/05/2022 09:58  ? ?DG CHEST PORT 1 VIEW ? ?Result Date: 02/06/2022 ?CLINICAL DATA:  Shortness of breath EXAM: PORTABLE CHEST 1 VIEW COMPARISON:  02/04/2022 FINDINGS: Mild cardiomegaly. No pulmonary edema or airspace consolidation. No pleural effusion. IMPRESSION: Mild cardiomegaly without overt pulmonary edema. Electronically Signed   By: 04/06/2022 M.D.   On: 02/06/2022 20:53  ? ?ECHOCARDIOGRAM COMPLETE ? ?Result Date: 02/05/2022 ?   ECHOCARDIOGRAM REPORT   Patient Name:   Trevor Thomas Date of Exam: 02/05/2022 Medical Rec #:  04/07/2022     Height:       71.0 in Accession #:    [id number]    Weight:       287.1 lb Date of Birth:  07/08/1966      BSA:          2.459 m? Patient Age:    56 years      BP:           119/74 mmHg Patient Gender: M             HR:           112 bpm. Exam Location:  Inpatient Procedure: 2D Echo, 3D Echo, Color Doppler and Cardiac Doppler Indications:    R06.02 SOB  History:        Patient has prior history of Echocardiogram examinations, most                 recent 11/16/2021. Pulmonary HTN and COPD, Signs/Symptoms:Dyspnea                 and Shortness of Breath; Risk Factors:Current Smoker and                 Hypertension. Cor pulmonale.  Sonographer:    11/18/2021 RDCS Referring Phys: [id number] TODD D MCDIARMID  Sonographer Comments: Technically difficult study due to poor echo windows. Image acquisition challenging due to COPD and Image acquisition challenging due to respiratory motion. Doppler artifact due to respiratory sounds. IMPRESSIONS  1. Left ventricular ejection fraction, by estimation, is >75%. The left  ventricle has hyperdynamic function. The left ventricle has no  regional wall motion abnormalities. There is mild left ventricular hypertrophy. Left ventricular diastolic parameters are consistent with Grade I diastolic dysfunction (impaired relaxation). There is the interventricular septum is flattened in systole and diastole, consistent with right ventricular pressure and volume overload.  2. Right ventricular systolic function is severely reduced. The right ventricular size is severely enlarged. There is moderately elevated pulmonary artery systolic pressure.  3. Right atrial size was severely dilated.  4. A small pericardial effusion is present.  5. The mitral valve is normal in structure. No evidence of mitral valve regurgitation. No evidence of mitral stenosis.  6. The aortic valve is tricuspid. Aortic valve regurgitation is not visualized. Aortic valve sclerosis/calcification is present, without any evidence of aortic stenosis.  7. Aortic dilatation noted. There is mild dilatation of the ascending aorta, measuring 44 mm.  8. The inferior vena cava is dilated in size with <50% respiratory variability, suggesting right atrial pressure of 15 mmHg. FINDINGS  Left Ventricle: Left ventricular ejection fraction, by estimation, is >75%. The left ventricle has hyperdynamic function. The left ventricle has no regional wall motion abnormalities. The left ventricular internal cavity size was normal in size. There is mild left ventricular hypertrophy. The interventricular septum is flattened in systole and diastole, consistent with right ventricular pressure and volume overload. Left ventricular diastolic parameters are consistent with Grade I diastolic dysfunction (impaired relaxation). Right Ventricle: The right ventricular size is severely enlarged. Right ventricular systolic function is severely reduced. There is moderately elevated pulmonary artery systolic pressure. The tricuspid regurgitant velocity is 3.22 m/s, and with an assumed right atrial pressure of 15 mmHg, the  estimated right ventricular systolic pressure is 56.5 mmHg. Left Atrium: Left atrial size was normal in size. Right Atrium: Right atrial size was severely dilated. Pericardium: A small pericardial effusion is present. Mit

## 2022-02-07 NOTE — Progress Notes (Addendum)
FPTS Brief Progress Note ? ?S: Patient having multiple desats to 60s-70s while asleep, will come back up to 90s when awake. He is appropriately alert and still overall improved from earlier. Discussed with RT and RN, O2 increased from 50% to 100%, desats still continuing.  ? ? ?O: ?BP 136/84   Pulse (!) 107   Temp 97.9 ?F (36.6 ?C) (Axillary)   Resp 19   Ht 5\' 11"  (1.803 m)   Wt 129.8 kg   SpO2 94%   BMI 39.92 kg/m?   ? ? ?A/P: ?Hypercarbia with respiratory acidosis  COPD  Cor pulmonale ?Discussed with PA from ICU. They will transfer to CCM for closer monitoring overnight. FPTS will gladly take Mr. Jeffreys back to our service when he is ready to go to stepdown/lower level of care. ?- Transfer to ICU service ?- Attempted to call his significant other, Campbell Lerner, again. No answer, no VM. ? ?Otho Najjar, MD ?02/07/2022, 1:16 AM ?PGY-3, [facility] Night Resident  ?Please page [phone] with questions.  ? ? ?

## 2022-02-07 NOTE — Assessment & Plan Note (Addendum)
Significant RV dysfunction new since last echocardiogram several years ago.  LV systolic function preserved. ? ?-Correction of hypercarbia and hypoxia should help RV function.  Currently on no inotropic support. ? ?

## 2022-02-07 NOTE — Progress Notes (Signed)
FPTS Interim Progress Note ? ?S: Seen pt at bedside today. He was intubated earlier today due to acute hypoxemic respiratory failure. We appreciate care of CCM and will resume care for pt after he out to the floor.  ? ?Towanda Octave, MD ?02/07/2022, 5:59 PM ?PGY-3, [facility] ?Service pager [phone]  ?

## 2022-02-07 NOTE — Plan of Care (Signed)
?  Problem: Elimination: ?Goal: Will not experience complications related to urinary retention ?Outcome: Progressing ?  ?Problem: Safety: ?Goal: Non-violent Restraint(s) ?Outcome: Progressing ?  ?

## 2022-02-07 NOTE — Progress Notes (Signed)
Patient transferred to ICU with RN and RT after trial smaller mask size and shaving patient. Otho Najjar called and updated on patient.  ?

## 2022-02-07 NOTE — Assessment & Plan Note (Addendum)
Due to combination of hypercarbia, hypoxia.  Currently calm on minimal sedation ? ?-Continue to wean sedation targeting RASS 0, -1 in preparation for potential extubation today. ?

## 2022-02-07 NOTE — Progress Notes (Signed)
RT attempted to placed pt on BiPAP. Pt ripping mask off and refusing to wear BiPAP. Pt placed back on 15L HFNC by RT. SpO2 89,MD aware, RN aware, RT will continue to monitor. ?

## 2022-02-08 ENCOUNTER — Inpatient Hospital Stay (HOSPITAL_COMMUNITY): Payer: Medicaid Other

## 2022-02-08 DIAGNOSIS — J9621 Acute and chronic respiratory failure with hypoxia: Secondary | ICD-10-CM | POA: Diagnosis not present

## 2022-02-08 DIAGNOSIS — J9602 Acute respiratory failure with hypercapnia: Secondary | ICD-10-CM | POA: Diagnosis not present

## 2022-02-08 DIAGNOSIS — J9601 Acute respiratory failure with hypoxia: Secondary | ICD-10-CM | POA: Diagnosis not present

## 2022-02-08 DIAGNOSIS — J9622 Acute and chronic respiratory failure with hypercapnia: Secondary | ICD-10-CM

## 2022-02-08 DIAGNOSIS — I50811 Acute right heart failure: Secondary | ICD-10-CM | POA: Diagnosis not present

## 2022-02-08 LAB — BASIC METABOLIC PANEL
Anion gap: 11 (ref 5–15)
BUN: 18 mg/dL (ref 6–20)
BUN: 16 mg/dL (ref 6–20)
CO2: 42 mmol/L — ABNORMAL HIGH (ref 22–32)
CO2: >45 mmol/L — ABNORMAL HIGH (ref 22–32)
Calcium: 8.7 mg/dL — ABNORMAL LOW (ref 8.9–10.3)
Calcium: 8.9 mg/dL (ref 8.9–10.3)
Chloride: 82 mmol/L — ABNORMAL LOW (ref 98–111)
Chloride: 81 mmol/L — ABNORMAL LOW (ref 98–111)
Creatinine, Ser: 0.8 mg/dL (ref 0.61–1.24)
Creatinine, Ser: 0.67 mg/dL (ref 0.61–1.24)
GFR, Estimated: >60 mL/min (ref >60)
GFR, Estimated: >60 mL/min (ref >60)
Glucose, Bld: 87 mg/dL (ref 70–99)
Glucose, Bld: 95 mg/dL (ref 70–99)
Potassium: 5.4 mmol/L — ABNORMAL HIGH (ref 3.5–5.1)
Potassium: 3.2 mmol/L — ABNORMAL LOW (ref 3.5–5.1)
Sodium: 135 mmol/L (ref 135–145)
Sodium: 137 mmol/L (ref 135–145)

## 2022-02-08 LAB — HEMOGLOBIN A1C
Hgb A1c MFr Bld: 5.8 % — ABNORMAL HIGH (ref 4.8–5.6)
Mean Plasma Glucose: 119.76 mg/dL

## 2022-02-08 LAB — POCT I-STAT 7, (LYTES, BLD GAS, ICA,H+H)
Acid-Base Excess: 21 mmol/L — ABNORMAL HIGH (ref 0.0–2.0)
Bicarbonate: 53.7 mmol/L — ABNORMAL HIGH (ref 20.0–28.0)
Calcium, Ion: 1.21 mmol/L (ref 1.15–1.40)
HCT: 56 % — ABNORMAL HIGH (ref 39.0–52.0)
Hemoglobin: 19 g/dL — ABNORMAL HIGH (ref 13.0–17.0)
O2 Saturation: 94 %
Patient temperature: 99.1
Potassium: 3.7 mmol/L (ref 3.5–5.1)
Sodium: 135 mmol/L (ref 135–145)
TCO2: >50 mmol/L — ABNORMAL HIGH (ref 22–32)
pCO2 arterial: 93.5 mmHg (ref 32–48)
pH, Arterial: 7.369 (ref 7.35–7.45)
pO2, Arterial: 81 mmHg — ABNORMAL LOW (ref 83–108)

## 2022-02-08 LAB — GLUCOSE, CAPILLARY
Glucose-Capillary: 101 mg/dL — ABNORMAL HIGH (ref 70–99)
Glucose-Capillary: 112 mg/dL — ABNORMAL HIGH (ref 70–99)
Glucose-Capillary: 114 mg/dL — ABNORMAL HIGH (ref 70–99)
Glucose-Capillary: 114 mg/dL — ABNORMAL HIGH (ref 70–99)
Glucose-Capillary: 136 mg/dL — ABNORMAL HIGH (ref 70–99)
Glucose-Capillary: 90 mg/dL (ref 70–99)

## 2022-02-08 LAB — PHOSPHORUS
Phosphorus: 4.8 mg/dL — ABNORMAL HIGH (ref 2.5–4.6)
Phosphorus: 4.9 mg/dL — ABNORMAL HIGH (ref 2.5–4.6)

## 2022-02-08 LAB — CBC
HCT: 56.5 % — ABNORMAL HIGH (ref 39.0–52.0)
Hemoglobin: 15.5 g/dL (ref 13.0–17.0)
MCH: 24.5 pg — ABNORMAL LOW (ref 26.0–34.0)
MCHC: 27.4 g/dL — ABNORMAL LOW (ref 30.0–36.0)
MCV: 89.3 fL (ref 80.0–100.0)
Platelets: 147 10*3/uL — ABNORMAL LOW (ref 150–400)
RBC: 6.33 MIL/uL — ABNORMAL HIGH (ref 4.22–5.81)
RDW: 18.3 % — ABNORMAL HIGH (ref 11.5–15.5)
WBC: 7.5 10*3/uL (ref 4.0–10.5)
nRBC: 0 % (ref 0.0–0.2)

## 2022-02-08 LAB — MAGNESIUM
Magnesium: 2.1 mg/dL (ref 1.7–2.4)
Magnesium: 2 mg/dL (ref 1.7–2.4)
Magnesium: 2.1 mg/dL (ref 1.7–2.4)

## 2022-02-08 LAB — POTASSIUM: Potassium: 5.5 mmol/L — ABNORMAL HIGH (ref 3.5–5.1)

## 2022-02-08 LAB — TRIGLYCERIDES: Triglycerides: 143 mg/dL (ref <150)

## 2022-02-08 MED ORDER — PROSOURCE TF PO LIQD
45.0000 mL | Freq: Two times a day (BID) | ORAL | Status: DC
  Administered 2022-02-08: 45 mL
  Filled 2022-02-08: qty 45

## 2022-02-08 MED ORDER — VITAL HIGH PROTEIN PO LIQD: 1000.0000 mL | ORAL | Status: DC

## 2022-02-08 MED ORDER — BUDESONIDE 0.5 MG/2ML IN SUSP
0.5000 mg | Freq: Two times a day (BID) | RESPIRATORY_TRACT | Status: DC
  Administered 2022-02-08 – 2022-02-10 (×4): 0.5 mg via RESPIRATORY_TRACT
  Filled 2022-02-08 (×4): qty 2

## 2022-02-08 MED ORDER — SODIUM CHLORIDE 0.9 % IV SOLN
250.0000 mL | INTRAVENOUS | Status: DC
Start: 2022-02-08 — End: 2022-02-12

## 2022-02-08 MED ORDER — STERILE WATER FOR INJECTION IJ SOLN
INTRAMUSCULAR | Status: AC
  Administered 2022-02-08: 2.5 mL
  Filled 2022-02-08: qty 10

## 2022-02-08 MED ORDER — NOREPINEPHRINE 4 MG/250ML-% IV SOLN
2.0000 ug/min | INTRAVENOUS | Status: DC
  Administered 2022-02-08: 2 ug/min via INTRAVENOUS
  Filled 2022-02-08: qty 250

## 2022-02-08 MED ORDER — QUETIAPINE FUMARATE 50 MG PO TABS
50.0000 mg | ORAL_TABLET | Freq: Two times a day (BID) | ORAL | Status: DC
  Administered 2022-02-08 – 2022-02-09 (×3): 50 mg
  Filled 2022-02-08 (×3): qty 1

## 2022-02-08 MED ORDER — SODIUM CHLORIDE 0.9 % IV SOLN
3.0000 g | Freq: Three times a day (TID) | INTRAVENOUS | Status: DC
  Administered 2022-02-08 – 2022-02-09 (×4): 3 g via INTRAVENOUS
  Filled 2022-02-08 (×4): qty 8

## 2022-02-08 MED ORDER — STERILE WATER FOR INJECTION IJ SOLN
INTRAMUSCULAR | Status: AC
  Administered 2022-02-08: 5 mL
  Filled 2022-02-08: qty 10

## 2022-02-08 MED ORDER — VITAL 1.5 CAL PO LIQD
1000.0000 mL | ORAL | Status: DC
  Administered 2022-02-08 – 2022-02-09 (×2): 1000 mL

## 2022-02-08 MED ORDER — INSULIN ASPART 100 UNIT/ML IJ SOLN
0.0000 [IU] | INTRAMUSCULAR | Status: DC
  Administered 2022-02-08 – 2022-02-10 (×5): 1 [IU] via SUBCUTANEOUS
  Administered 2022-02-10: 2 [IU] via SUBCUTANEOUS

## 2022-02-08 MED ORDER — CLONAZEPAM 0.5 MG PO TABS
0.5000 mg | ORAL_TABLET | Freq: Two times a day (BID) | ORAL | Status: DC
  Administered 2022-02-08 – 2022-02-09 (×4): 0.5 mg
  Filled 2022-02-08 (×4): qty 1

## 2022-02-08 MED ORDER — PROSOURCE TF PO LIQD
45.0000 mL | Freq: Four times a day (QID) | ORAL | Status: DC
  Administered 2022-02-08 – 2022-02-09 (×4): 45 mL
  Filled 2022-02-08 (×4): qty 45

## 2022-02-08 MED ORDER — POTASSIUM CHLORIDE 20 MEQ PO PACK
60.0000 meq | PACK | Freq: Once | ORAL | Status: AC
  Administered 2022-02-08: 60 meq
  Filled 2022-02-08: qty 3

## 2022-02-08 NOTE — Progress Notes (Signed)
Nutrition Follow-up ? ?DOCUMENTATION CODES:  ? ?Not applicable ? ?INTERVENTION:  ? ?Initiate tube feeds via OG tube: ?- Start Vital 1.5 @ 20 ml/hr and advance by 10 ml q 6 hours to goal rate of 55 ml/hr (1320 ml/day) ?- ProSource TF 45 ml QID ? ?Tube feeding regimen at goal rate provides 2140 kcal, 133 grams of protein, and 1008 ml of H2O. ? ?Tube feeding regimen at goal rate and current propofol provides 2536 total kcal (>100% of needs). ? ?NUTRITION DIAGNOSIS:  ? ?Inadequate oral intake related to inability to eat as evidenced by NPO status. ? ?Ongoing, being addressed via TF ? ?GOAL:  ? ?Patient will meet greater than or equal to 90% of their needs ? ?Met via TF at goal ? ?MONITOR:  ? ?Vent status, Labs, Weight trends, TF tolerance, I & O's ? ?REASON FOR ASSESSMENT:  ? ?Ventilator, Consult ?Enteral/tube feeding initiation and management ? ?ASSESSMENT:  ? ?56 year old male who presented to the ED on 5/05 with SOB. PMH of HTN, HLD, COPD, polysubstance abuse, EtOH abuse, chronic pain with osteoarthritis, TBI, anxiety, GI bleed. Pt admitted with acute hypoxic respiratory failure likely secondary to CHF vs COPD exacerbation. ? ?05/07 - rapid response ?05/08 - transferred to ICU, intubated ? ?Discussed pt with RN and during ICU rounds. ? ?Consult received for tube feeding initiation and management. Pt with OG tube tip and side port in stomach per abdominal x-ray, currently to low intermittent suction. Pt with epistaxis per RN. ? ?Will start tube feeds at trickle rate and slowly advance to goal. RN aware. ? ?Admit weight: 132.9 kg ?Current weight: 121.8 kg ? ?Pt with non-pitting edema to BUE. ? ?Patient is currently intubated on ventilator support ?MV: 8.4 L/min ?Temp (24hrs), Avg:98.8 ?F (37.1 ?C), Min:98.2 ?F (36.8 ?C), Max:99.4 ?F (37.4 ?C) ? ?Drips: ?Propofol: 15 ml/hr (provides 396 kcal daily from lipid) ?Fentanyl ? ?Medications reviewed and include: diamox, colace, IV lasix, SSI q 4 hours, miralax, IV abx ? ?Labs  reviewed: potassium 5.5, platelets 147 ?CBG's: 90-129 x 24 hours ? ?UOP: 5780 ml x 24 hours ?OGT: 325 ml x 24 hours ?I/O's: -7.2 L since admit ? ?NUTRITION - FOCUSED PHYSICAL EXAM: ? ?Flowsheet Row Most Recent Value  ?Orbital Region No depletion  ?Upper Arm Region No depletion  ?Thoracic and Lumbar Region Mild depletion  ?Buccal Region No depletion  ?Temple Region Mild depletion  ?Clavicle Bone Region Mild depletion  ?Clavicle and Acromion Bone Region Mild depletion  ?Scapular Bone Region No depletion  ?Dorsal Hand No depletion  ?Patellar Region No depletion  ?Anterior Thigh Region Mild depletion  ?Posterior Calf Region No depletion  ?Edema (RD Assessment) Mild  ?Hair Reviewed  ?Eyes Reviewed  ?Mouth Reviewed  ?Skin Reviewed  ?Nails Reviewed  ? ?  ? ? ?Diet Order:   ?Diet Order   ? ?       ?  Diet NPO time specified  Diet effective now       ?  ? ?  ?  ? ?  ? ? ?EDUCATION NEEDS:  ? ?Not appropriate for education at this time ? ?Skin:  Skin Assessment: Reviewed RN Assessment (MASD coccyx) ? ?Last BM:  02/04/22 ? ?Height:  ? ?Ht Readings from Last 1 Encounters:  ?02/07/22 6' 1"  (1.854 m)  ? ? ?Weight:  ? ?Wt Readings from Last 1 Encounters:  ?02/08/22 121.8 kg  ? ? ?Ideal Body Weight:  83.6 kg ? ?BMI:  Body mass index is 35.43 kg/m?. ? ?Estimated  Nutritional Needs:  ? ?Kcal:  2200-2400 ? ?Protein:  125-140 grams ? ?Fluid:  2.0 L ? ? ? ?Gustavus Bryant, MS, RD, LDN ?Inpatient Clinical Dietitian ?Please see AMiON for contact information. ? ?

## 2022-02-08 NOTE — Progress Notes (Signed)
BMP reviewed, KCL replacement ordered.  ? ? ?Canary Brim, MSN, APRN, NP-C, AGACNP-BC ?[facility] ?02/08/2022, 3:10 PM ? ? ?Please see [email or web address] for pager details.  ? ?From 7A-7P if no response, please call [phone] ?After hours, please call ELink [phone] ? ?

## 2022-02-08 NOTE — Progress Notes (Signed)
? ?NAME:  Trevor Thomas, MRN:  [id number], DOB:  Mar 06, 1966, LOS: 3 ?ADMISSION DATE:  02/04/2022, CONSULTATION DATE:  5/7 ?REFERRING MD:  Dr. Chauncey Reading, CHIEF COMPLAINT:  Acute Respiratory Failure w/ hypoxia and hypercarbia  ? ?History of Present Illness:  ?56 year old male with pertinent PMH of COPD, tobacco abuse, HFpEF, polysubstance abuse, chronic pain syndrome on Suboxone presents to [facility] ED on 5/5 with SOB. ? ?On 5/5 patient presented to [facility] with progressive SOB and increased leg swelling.  Patient's vital stable.  Afebrile.  Sats 99% on [place].  Does not wear O2 at home.  WBC WNL.  BNP 207.  VBG 7.39, 72, 46, 44 (likely chronic hypercarbia).  Patient admitted to floors with family medicine for likely CHF exacerbation.  Patient refused BiPAP due to anxiety.  Was stable on [place].  Started on IV Lasix.  Started on home Suboxone. ? ?On 5/6, Echo performed showing EF greater than [id number]; grade 1 diastolic dysfunction; cor pulmonale.  Cards consulted.  On 5/7 cards increase Lasix from 40 twice daily to 80 twice daily due to low UOP.  Later in the evening, patient very obtunded and hypoxic.  GCS initially less than 8.  Placed on 15 L salter HFNC.  Rapid response called.  Patient given Narcan with improvement in mental status.  ABG drawn: pH 7.2, PCO2 123, PaO2 61.  Patient placed on BiPAP.  Given additional 40 Lasix.  CXR showing increased infiltrates in RLL possible aspiration.  Started on Unasyn.  UDS positive for THC. ? ?Pertinent  Medical History  ?ETOH Abuse ?Polysubstance Abuse  ?Wears Dentures  ?Chronic Pain - on suboxone  ?GIB  ?COPD  ?Depression / Anxiety  ?Prior Suicidal Ideation ?Anisocoria  ?DDD / Osteoarthritis  ? ?Significant Hospital Events: ?Including procedures, antibiotic start and stop dates in addition to other pertinent events   ?5/5 Admitted to [facility] for SOB ?5/7 PCCM consulted for acute encephalopathy & respiratory distress.  Pt placed on BiPAP & given Narcan. ?5/8 ABG compensated, PCO2 118, decompensated  afternoon requiring bipap. CTA Chest neg for PE.  ? ?Interim History / Subjective:  ?Afebrile ?On vent - PEEP 10, 50% ?Glucose range 87-106  ?I/O 5.7L UOP, -4.7L in last 24 hours  ? ?Objective   ?Blood pressure 106/75, pulse 85, temperature 98.9 ?F (37.2 ?C), temperature source Oral, resp. rate (!) 24, height 6\' 1"  (1.854 m), weight 121.8 kg, SpO2 97 %. ?   ?Vent Mode: PRVC ?FiO2 (%):  [40 %-100 %] 50 % ?Set Rate:  [16 bmp-20 bmp] 16 bmp ?Vt Set:  [480 mL-630 mL] 480 mL ?PEEP:  [5 cmH20-10 cmH20] 10 cmH20 ?Plateau Pressure:  [20 cmH20-25 cmH20] 25 cmH20  ? ?Intake/Output Summary (Last 24 hours) at 02/08/2022 0807 ?Last data filed at 02/08/2022 0700 ?Gross per 24 hour  ?Intake 1394.65 ml  ?Output 6105 ml  ?Net -4710.35 ml  ? ?Filed Weights  ? 02/06/22 0523 02/07/22 0131 02/08/22 0156  ?Weight: 129.8 kg 125.7 kg 121.8 kg  ? ? ?Examination: ?General: chronically ill appearing adult male lying in bed in NAD ?HEENT: MM pink/moist, ETT, pupils equal/reactive, anicteric  ?Neuro: sedate, awakens to voice / opens eyes then drifts back to sleep, moves extremities spontaneously ?CV: s1s2 RRR, SR on monitor, no m/r/g ?PULM: non-labored at rest, lungs bilaterally diminished ?GI: soft, bsx4 active, gastric tube in place  ?Extremities: warm/dry, 1-2+ generalized edema  ?Skin: no rashes or lesions ? ?Resolved Hospital Problem list   ? ? ?Assessment & Plan:  ? ?Acute on chronic  respiratory failure withy hypoxia and hypercarbia: COPD vs. CHF exacerbation.  PE ruled out.  ?Hx of COPD: no PFTs on file; only on PRN albuterol ?Possible aspiration pneumonia ?Possible hx of OSA: non compliant with bipap ?THC Abuse  ?-PRVC 8cc/kg  ?-wean PEEP / fiO2 for sats 88-94% ?-brovana + pulmicort (adjust dosing) ?-PRN albuterol  ?-follow intermittent CXR  ?-continue diuresis for negative balance  ? ?Acute Metabolic Encephalopathy ?Suspect in setting of hypercarbia; thought related to suboxone; improved with Narcan. UDS positive for THC.  ?-supportive  care  ?-PAD protocol for sedation with propofol + fentanyl  ?-add PT seroquel, klonopin  ?-daily WUA ?-PT efforts when able  ?-hold home suboxone ? ?Cor Pulmonale ?Diastolic CHF ?HTN ?-appreciate Cardiology assistance with patient care  ?-lasix + acetazolamide  ?-follow electrolytes closely  ?-hold amlodipine with hypotension / suspect sedation related  ? ?Epistaxis  ?Suspect drying effects of O2 ?-nasal packing, consider removal in am 5/10 ?-empiric unasyn while nasal packing in place ? ?Hypomagnesemia  ?At Risk Hypokalemia  ?-monitor, replace as indicated  ?-follow up K, hemolyzed this am, on lasix.  2pm draw.  ? ?Hx of polysubstance use ?Chronic Pain ?-hold home Suboxone  ?-PAD protocol as above with fentanyl  ?-PRN tylenol  ? ?Tobacco absue ?-cessation counseling when able  ?-continue nicotine patch  ? ?At Risk Malnutrition  ?-begin TF  ? ?At Risk Hyperglycemia  ?-add SSI  ? ?Best Practice (right click and "Reselect all SmartList Selections" daily)  ?Diet/type: tubefeeds and NPO ?DVT prophylaxis: prophylactic heparin  ?GI prophylaxis: PPI ?Lines: N/A ?Foley:  N/A ?Code Status:  full code ?Last date of multidisciplinary goals of care discussion: Significant other, Arnoldo Hooker, updated 5/9 on plan of care.  She shares he has left AMA in past, medically non-compliant.   ? ?Critical Care Time: 37 minutes  ? ? ?Noe Gens, MSN, APRN, NP-C, AGACNP-BC ?[facility] ?02/08/2022, 8:07 AM ? ? ?Please see [email or web address] for pager details.  ? ?From 7A-7P if no response, please call [phone] ?After hours, please call Warren Lacy [phone] ? ? ? ?

## 2022-02-08 NOTE — Progress Notes (Signed)
eLink Physician-Brief Progress Note ?Patient Name: Trevor Thomas ?DOB: 04/09/1966 ?MRN: [id number] ? ? ?Date of Service ? 02/08/2022  ?HPI/Events of Note ? K+ 5.4 on a hemolyzed specimen.  ?eICU Interventions ? K+ repeated.  ? ? ? ?  ? ?[name] ?02/08/2022, 5:22 AM ?

## 2022-02-08 NOTE — Progress Notes (Signed)
? ?Progress Note ? ?Patient Name: Trevor Thomas ?Date of Encounter: 02/08/2022 ? ?[facility] Cardiologist: Kirk Ruths, MD  ? ?Subjective  ? ?Yesterday, patient refused BiPAP and was subsequently intubated and transitioned to MICU. Hypotensive overnight and started on norepinephrine gtt.  ? ?Inpatient Medications  ?  ?Scheduled Meds: ? acetaZOLAMIDE  250 mg Intravenous Q12H  ? arformoterol  15 mcg Nebulization BID  ? budesonide (PULMICORT) nebulizer solution  0.5 mg Nebulization BID  ? chlorhexidine gluconate (MEDLINE KIT)  15 mL Mouth Rinse BID  ? Chlorhexidine Gluconate Cloth  6 each Topical Daily  ? clonazePAM  0.5 mg Per Tube BID  ? docusate  100 mg Per Tube BID  ? enoxaparin (LOVENOX) injection  60 mg Subcutaneous Q24H  ? feeding supplement (PROSource TF)  45 mL Per Tube QID  ? fentaNYL (SUBLIMAZE) injection  50 mcg Intravenous Once  ? furosemide  80 mg Intravenous BID  ? insulin aspart  0-9 Units Subcutaneous Q4H  ? mouth rinse  15 mL Mouth Rinse 10 times per day  ? nicotine  21 mg Transdermal Daily  ? polyethylene glycol  17 g Per Tube Daily  ? QUEtiapine  50 mg Per Tube BID  ? revefenacin  175 mcg Nebulization Daily  ? ?Continuous Infusions: ? sodium chloride 5 mL/hr at 02/08/22 1000  ? sodium chloride    ? ampicillin-sulbactam (UNASYN) IV Stopped (02/08/22 8768)  ? feeding supplement (VITAL 1.5 CAL) 1,000 mL (02/08/22 1031)  ? fentaNYL infusion INTRAVENOUS 100 mcg/hr (02/08/22 1000)  ? norepinephrine (LEVOPHED) Adult infusion Stopped (02/08/22 0405)  ? ondansetron (ZOFRAN) IV    ? propofol (DIPRIVAN) infusion 25 mcg/kg/min (02/08/22 1000)  ? ?PRN Meds: ?sodium chloride, acetaminophen, albuterol, fentaNYL, naLOXone (NARCAN)  injection, ondansetron **OR** ondansetron **OR** ondansetron (ZOFRAN) IV  ? ?Vital Signs  ?  ?Vitals:  ? 02/08/22 0750 02/08/22 0800 02/08/22 0900 02/08/22 1026  ?BP:  105/73 137/87   ?Pulse:  88 99 86  ?Resp:  16 (!) 32 16  ?Temp: 98.9 ?F (37.2 ?C)     ?TempSrc: Oral     ?SpO2:   93% 94% 93%  ?Weight:      ?Height:      ? ? ?Intake/Output Summary (Last 24 hours) at 02/08/2022 1107 ?Last data filed at 02/08/2022 1031 ?Gross per 24 hour  ?Intake 1606.58 ml  ?Output 5455 ml  ?Net -3848.42 ml  ? ? ?  02/08/2022  ?  1:56 AM 02/07/2022  ?  1:31 AM 02/06/2022  ?  5:23 AM  ?Last 3 Weights  ?Weight (lbs) 268 lb 8.3 oz 277 lb 1.9 oz 286 lb 3.2 oz  ?Weight (kg) 121.8 kg 125.7 kg 129.819 kg  ?   ? ?Telemetry  ?  ?Sinus in the 70s - Personally Reviewed ? ?ECG  ?  ?No new tracings - Personally Reviewed ? ?Physical Exam  ? ?GEN: intubated and sedated   ?Neck: intubated ?Cardiac: RRR, no murmurs, rubs, or gallops.  ?Respiratory: intubated, anterior lung sounds present ?GI: Soft, nontender, non-distended  ?MS: minimal edema B LE ?Neuro:  sedated ?Psych: sedated ? ?Labs  ?  ?High Sensitivity Troponin:   ?Recent Labs  ?Lab 02/04/22 ?1541 02/04/22 ?2140  ?TROPONINIHS 8 7  ?   ?Chemistry ?Recent Labs  ?Lab 02/04/22 ?1541 02/04/22 ?2140 02/05/22 ?0140 02/06/22 ?0252 02/07/22 ?0301 02/07/22 ?1503 02/07/22 ?1720 02/08/22 ?0406 02/08/22 ?1157 02/08/22 ?2620  ?NA 129* 132*   < > 136 136   < > 132* 135 135  --   ?  K 4.8 3.8   < > 4.1 4.4   < > 3.8 3.7 5.4* 5.5*  ?CL 86* 86*   < > 85* 79*  --   --   --  82*  --   ?CO2 34* 33*   < > 43* >45*  --   --   --  42*  --   ?GLUCOSE 100* 208*   < > 118* 103*  --   --   --  87  --   ?BUN 8 8   < > 7 9  --   --   --  18  --   ?CREATININE 0.73 0.80   < > 0.67 0.67  --   --   --  0.80  --   ?CALCIUM 8.4* 8.8*   < > 8.9 8.9  --   --   --  8.7*  --   ?MG  --   --   --   --  1.5*  --   --   --  2.1  --   ?PROT 5.8* 6.7  --   --   --   --   --   --   --   --   ?ALBUMIN 2.7* 2.9*  --   --   --   --   --   --   --   --   ?AST 31 15  --   --   --   --   --   --   --   --   ?ALT 6 7  --   --   --   --   --   --   --   --   ?ALKPHOS 74 81  --   --   --   --   --   --   --   --   ?BILITOT 1.1 0.7  --   --   --   --   --   --   --   --   ?GFRNONAA >60 >60   < > >60 >60  --   --   --  >60  --   ?ANIONGAP  9 13   < > 8 NOT CALCULATED  --   --   --  11  --   ? < > = values in this interval not displayed.  ?  ?Lipids  ?Recent Labs  ?Lab 02/05/22 ?1006 02/08/22 ?0420  ?CHOL 135  --   ?TRIG 83 143  ?HDL 34*  --   ?LDLCALC 84  --   ?CHOLHDL 4.0  --   ?  ?Hematology ?Recent Labs  ?Lab 02/06/22 ?2253 02/07/22 ?0218 02/07/22 ?1503 02/07/22 ?1720 02/08/22 ?0406 02/08/22 ?0420  ?WBC 5.9 6.4  --   --   --  7.5  ?RBC 6.03* 5.95*  --   --   --  6.33*  ?HGB 14.7 14.7   < > 19.0* 19.0* 15.5  ?HCT 55.5* 54.3*   < > 56.0* 56.0* 56.5*  ?MCV 92.0 91.3  --   --   --  89.3  ?MCH 24.4* 24.7*  --   --   --  24.5*  ?MCHC 26.5* 27.1*  --   --   --  27.4*  ?RDW 18.1* 18.1*  --   --   --  18.3*  ?PLT 192 153  --   --   --  147*  ? < > = values in this interval  not displayed.  ? ?Thyroid No results for input(s): TSH, FREET4 in the last 168 hours.  ?BNP ?Recent Labs  ?Lab 02/04/22 ?1541  ?BNP 207.1*  ?  ?DDimer  ?Recent Labs  ?Lab 02/07/22 ?0218  ?DDIMER 0.75*  ?  ? ?Radiology  ?  ?DG Abd 1 View ? ?Result Date: 02/07/2022 ?CLINICAL DATA:  Encounter for orogastric tube placement EXAM: ABDOMEN - 1 VIEW COMPARISON:  None Available. FINDINGS: There is an orogastric tube with tip and side port overlying the stomach. Nonobstructive bowel gas pattern. Bilateral pleural effusions and bibasilar airspace disease, see separately dictated chest radiograph. IMPRESSION: Orogastric tube tip and side port overlie the stomach. Electronically Signed   By: Maurine Simmering M.D.   On: 02/07/2022 14:19  ? ?DG Abd 1 View ? ?Result Date: 02/07/2022 ?CLINICAL DATA:  Bowel dysfunction EXAM: ABDOMEN - 1 VIEW COMPARISON:  None Available. FINDINGS: The bowel gas pattern is normal. No radio-opaque calculi or other significant radiographic abnormality are seen. IMPRESSION: Negative. Electronically Signed   By: Ulyses Jarred M.D.   On: 02/07/2022 03:38  ? ?CT Angio Chest Pulmonary Embolism (PE) W or WO Contrast ? ?Result Date: 02/07/2022 ?CLINICAL DATA:  Pulmonary embolism (PE)  suspected, high prob EXAM: CT ANGIOGRAPHY CHEST WITH CONTRAST TECHNIQUE: Multidetector CT imaging of the chest was performed using the standard protocol during bolus administration of intravenous contrast. Multiplanar CT image reconstructions and MIPs were obtained to evaluate the vascular anatomy. RADIATION DOSE REDUCTION: This exam was performed according to the departmental dose-optimization program which includes automated exposure control, adjustment of the mA and/or kV according to patient size and/or use of iterative reconstruction technique. CONTRAST:  156m OMNIPAQUE IOHEXOL 350 MG/ML SOLN COMPARISON:  Chest CT 11/17/2021 FINDINGS: Cardiovascular: Satisfactory opacification of the pulmonary arteries to the segmental level. No evidence of pulmonary embolism. Cardiomegaly.Trace pericardial effusion. Ascending aorta measures up to 4.4 cm, previously 4.4 cm. Enlarged main and branch pulmonary arteries. Mediastinum/Nodes: There are few prominent mediastinal lymph nodes, for reference a right paratracheal lymph node measures up to 1.5 cm short axis (series 8, image 127). This is similar to prior exam in February. Nasogastric tube courses down the esophagus. Lungs/Pleura: There is endotracheal tube in place with tip approximally 2.0 cm above the carina. Left lower lobe collapse. Right basilar atelectasis. There are trace, right greater than left pleural effusions. No pneumothorax. Upper Abdomen: No acute abnormality. Musculoskeletal: No chest wall abnormality. No acute or significant osseous findings. Review of the MIP images confirms the above findings. IMPRESSION: Left lower lobe collapse and right basilar atelectasis. Trace, right greater than left pleural effusions. No evidence of pulmonary embolism. Enlarged main and branch pulmonary arteries as can be seen in pulmonary hypertension. Cardiomegaly with trace pericardial effusion. Prominent mediastinal lymph nodes, likely reactive. Electronically Signed   By:  JMaurine SimmeringM.D.   On: 02/07/2022 16:46  ? ?DG CHEST PORT 1 VIEW ? ?Result Date: 02/08/2022 ?CLINICAL DATA:  Dyspnea, acute respiratory failure with hypoxia EXAM: PORTABLE CHEST 1 VIEW COMPARISON:  Chest radiogra

## 2022-02-08 NOTE — Progress Notes (Signed)
eLink Physician-Brief Progress Note ?Patient Name: Trevor Thomas ?DOB: 07-Oct-1965 ?MRN: [id number] ? ? ?Date of Service ? 02/08/2022  ?HPI/Events of Note ? BP 81/58, HR 62, patient is being diuresed.  ?eICU Interventions ? Peripheral Norepinephrine gtt ordered.  ? ? ? ?  ? ?[name] ?02/08/2022, 12:43 AM ?

## 2022-02-08 NOTE — Progress Notes (Signed)
An USGPIV (ultrasound guided PIV) has been placed for short-term vasopressor infusion. A correctly placed ivWatch must be used when administering Vasopressors. Should this treatment be needed beyond 72 hours, central line access should be obtained.  It will be the responsibility of the bedside nurse to follow best practice to prevent extravasations.   ?

## 2022-02-08 NOTE — Progress Notes (Signed)
Pharmacy Antibiotic Note ? ?Domenique Southers is a 56 y.o. male admitted on 02/04/2022.  Pharmacy has been consulted for Unasyn dosing. Patient with nose bleeding post intubation yesterday. Afebrile. WBC wnl.  ? ?Plan: ?Start Unasyn 3g IV q8h  ?Monitor renal function, cultures, clinical progression and length of therapy ? ?Height: 6\' 1"  (185.4 cm) ?Weight: 121.8 kg (268 lb 8.3 oz) ?IBW/kg (Calculated) : 79.9 ? ?Temp (24hrs), Avg:98.7 ?F (37.1 ?C), Min:97.5 ?F (36.4 ?C), Max:99.4 ?F (37.4 ?C) ? ?Recent Labs  ?Lab 02/04/22 ?1541 02/04/22 ?2140 02/05/22 ?0140 02/06/22 ?04/08/22 02/06/22 ?2253 02/07/22 ?04/09/22 02/07/22 ?0301 02/08/22 ?04/10/22  ?WBC 8.0  --   --   --  5.9 6.4  --  7.5  ?CREATININE 0.73 0.80 0.74 0.67  --   --  0.67 0.80  ?  ?Estimated Creatinine Clearance: 141 mL/min (by C-G formula based on SCr of 0.8 mg/dL).   ? ?Allergies  ?Allergen Reactions  ? Bee Venom Anaphylaxis  ? Clonidine Derivatives Other (See Comments)  ?  Stomach pain  ? ? ?Antimicrobials this admission: ?Unasyn 5/7 >> 5/8; 5:9 >>   ? ?Dose adjustments this admission: ? ?Microbiology results: ? ? ?Thank you for allowing pharmacy to be a part of this patient?s care. ? ?7/8, PharmD, BCPS ?Clinical Pharmacist ?02/08/2022 8:14 AM ? ? ?

## 2022-02-08 NOTE — Progress Notes (Signed)
eLink Physician-Brief Progress Note ?Patient Name: Trevor Thomas Current ?DOB: 05-Jun-1966 ?MRN: [id number] ? ? ?Date of Service ? 02/08/2022  ?HPI/Events of Note ? ABG reviewed.  ?eICU Interventions ? No intervention indicated.  ? ? ? ?Intervention Category ?Major Interventions: Respiratory failure - evaluation and management ? ?Thomasene Lot Krystelle Prashad ?02/08/2022, 4:33 AM ?

## 2022-02-09 ENCOUNTER — Inpatient Hospital Stay (HOSPITAL_COMMUNITY): Payer: Medicaid Other

## 2022-02-09 DIAGNOSIS — J9622 Acute and chronic respiratory failure with hypercapnia: Secondary | ICD-10-CM | POA: Diagnosis not present

## 2022-02-09 DIAGNOSIS — J9621 Acute and chronic respiratory failure with hypoxia: Secondary | ICD-10-CM | POA: Diagnosis not present

## 2022-02-09 LAB — CBC
HCT: 52.8 % — ABNORMAL HIGH (ref 39.0–52.0)
Hemoglobin: 14.6 g/dL (ref 13.0–17.0)
MCH: 24.4 pg — ABNORMAL LOW (ref 26.0–34.0)
MCHC: 27.7 g/dL — ABNORMAL LOW (ref 30.0–36.0)
MCV: 88.1 fL (ref 80.0–100.0)
Platelets: 261 10*3/uL (ref 150–400)
RBC: 5.99 MIL/uL — ABNORMAL HIGH (ref 4.22–5.81)
RDW: 19.2 % — ABNORMAL HIGH (ref 11.5–15.5)
WBC: 5.5 10*3/uL (ref 4.0–10.5)
nRBC: 0 % (ref 0.0–0.2)

## 2022-02-09 LAB — BASIC METABOLIC PANEL
Anion gap: 7 (ref 5–15)
BUN: 18 mg/dL (ref 6–20)
CO2: 44 mmol/L — ABNORMAL HIGH (ref 22–32)
Calcium: 8.9 mg/dL (ref 8.9–10.3)
Chloride: 85 mmol/L — ABNORMAL LOW (ref 98–111)
Creatinine, Ser: 0.6 mg/dL — ABNORMAL LOW (ref 0.61–1.24)
GFR, Estimated: >60 mL/min (ref >60)
Glucose, Bld: 116 mg/dL — ABNORMAL HIGH (ref 70–99)
Potassium: 5.2 mmol/L — ABNORMAL HIGH (ref 3.5–5.1)
Sodium: 136 mmol/L (ref 135–145)

## 2022-02-09 LAB — MAGNESIUM
Magnesium: 2.1 mg/dL (ref 1.7–2.4)
Magnesium: 2.1 mg/dL (ref 1.7–2.4)

## 2022-02-09 LAB — GLUCOSE, CAPILLARY
Glucose-Capillary: 127 mg/dL — ABNORMAL HIGH (ref 70–99)
Glucose-Capillary: 128 mg/dL — ABNORMAL HIGH (ref 70–99)
Glucose-Capillary: 149 mg/dL — ABNORMAL HIGH (ref 70–99)
Glucose-Capillary: 117 mg/dL — ABNORMAL HIGH (ref 70–99)
Glucose-Capillary: 89 mg/dL (ref 70–99)
Glucose-Capillary: 86 mg/dL (ref 70–99)

## 2022-02-09 LAB — PHOSPHORUS
Phosphorus: 4.3 mg/dL (ref 2.5–4.6)
Phosphorus: 4.6 mg/dL (ref 2.5–4.6)

## 2022-02-09 MED ORDER — ACETAMINOPHEN 325 MG PO TABS
650.0000 mg | ORAL_TABLET | Freq: Four times a day (QID) | ORAL | Status: DC | PRN
  Administered 2022-02-09 – 2022-02-12 (×6): 650 mg via ORAL
  Filled 2022-02-09 (×7): qty 2

## 2022-02-09 MED ORDER — FUROSEMIDE 10 MG/ML IJ SOLN
40.0000 mg | Freq: Two times a day (BID) | INTRAMUSCULAR | Status: DC
  Administered 2022-02-09 – 2022-02-10 (×3): 40 mg via INTRAVENOUS
  Filled 2022-02-09 (×3): qty 4

## 2022-02-09 MED ORDER — ORAL CARE MOUTH RINSE: 15.0000 mL | Freq: Two times a day (BID) | OROMUCOSAL | Status: DC

## 2022-02-09 MED ORDER — SODIUM CHLORIDE 0.9 % IV SOLN
3.0000 g | Freq: Three times a day (TID) | INTRAVENOUS | Status: AC
  Administered 2022-02-09 – 2022-02-11 (×6): 3 g via INTRAVENOUS
  Filled 2022-02-09 (×8): qty 8

## 2022-02-09 MED ORDER — ACETAZOLAMIDE 250 MG PO TABS
250.0000 mg | ORAL_TABLET | Freq: Two times a day (BID) | ORAL | Status: AC
  Administered 2022-02-09 (×2): 250 mg
  Filled 2022-02-09 (×3): qty 1

## 2022-02-09 MED ORDER — SENNOSIDES-DOCUSATE SODIUM 8.6-50 MG PO TABS
1.0000 | ORAL_TABLET | Freq: Two times a day (BID) | ORAL | Status: DC
  Administered 2022-02-09 (×2): 1
  Filled 2022-02-09 (×2): qty 1

## 2022-02-09 NOTE — Progress Notes (Signed)
Patient passed [employer] screen. Took PO Diamox with sips of water w/ no complications.  ?

## 2022-02-09 NOTE — Procedures (Signed)
Extubation Procedure Note ? ?Patient Details:   ?Name: Trevor Thomas ?DOB: 1966-03-28 ?MRN: [id number] ?  ?Airway Documentation:  ?  ?Vent end date: 02/09/22 Vent end time: 1439  ? ?Evaluation ? O2 sats: stable throughout ?Complications: No apparent complications ?Patient did tolerate procedure well. ?Bilateral Breath Sounds: Diminished ?  ?Yes ? ? ?Patient was extubated to 3 LPM nasal cannula. Prior to extubation patient had cuff leak. Patient had strong productive cough and was able to speak name. No stridor was noted. BBS clear; diminished. Vitals stable. No complications noted. RT will continue to monitor.  ? ?Caralee Morea M ?02/09/2022, 2:51 PM ? ?

## 2022-02-09 NOTE — Progress Notes (Signed)
Titrated to 6L bleed in. Sat 97%.  ?

## 2022-02-09 NOTE — Progress Notes (Signed)
eLink Physician-Brief Progress Note ?Patient Name: Trevor Thomas ?DOB: April 21, 1966 ?MRN: [id number] ? ? ?Date of Service ? 02/09/2022  ?HPI/Events of Note ? Pt passed bedside swallow. Has good cough  ?eICU Interventions ? May have sips of liquid and meds PO.   ? ? ? ?  ? ?Thurnell Garbe CRUZ ?02/09/2022, 8:37 PM ?

## 2022-02-09 NOTE — Progress Notes (Signed)
PT awake and alert. Placed pt on Cpap 8 with 8L bleed in. Pt tolerating well. Will continue to monitor.  ?

## 2022-02-09 NOTE — TOC Initial Note (Signed)
Transition of Care (TOC) - Initial/Assessment Note  ? ? ?Patient Details  ?Name: Trevor Thomas ?MRN: [id number] ?Date of Birth: 03/24/1966 ? ?Transition of Care (TOC) CM/SW Contact:    ?Tom-Johnson, Hershal Coria, RN ?Phone Number: ?02/09/2022, 4:16 PM ? ?Clinical Narrative:                 ? ?CM spoke with patient at bedside about needs for post hospital transition. Patient currently intubated, plan for extubation later today. Response with gesture. Permits CM to speak with sister, Lamar Laundry and Katheren Puller at bedside.  ?Admitted for Rt side CHF. From home with Katheren Puller, her daughter and her two grand children. Patient has three children who lives at [facility]. Patient was independent with care and drive self prior to hospitalization. Lupita Leash states that patient is non compliant with his medications and appointments. Patient is currently on disability. Has a cane at home as needed.   ?PCP is Okey Regal, MD at [facility]. [facility]ses [facility]. ?No TOC needs noted at this time. CM will continue to follow with needs.  ? ? ?  ?Barriers to Discharge: Continued Medical Work up ? ? ?Patient Goals and CMS Choice ?Patient states their goals for this hospitalization and ongoing recovery are:: To get better and return home. ?CMS Medicare.gov Compare Post Acute Care list provided to:: Patient ?Choice offered to / list presented to : Patient, Sibling (Sister, Lamar Laundry and Katheren Puller.) ? ?Expected Discharge Plan and Services ?  ?  ?Discharge Planning Services: CM Consult ?  ?Living arrangements for the past 2 months: Mobile Home ?                ?  ?  ?  ?  ?  ?  ?  ?  ?  ?  ? ?Prior Living Arrangements/Services ?Living arrangements for the past 2 months: Mobile Home ?Lives with:: Significant Other ?Patient language and need for interpreter reviewed:: Yes ?Do you feel safe going back to the place where you live?: Yes      ?Need for Family Participation in Patient Care: Yes (Comment) ?Care giver support system in  place?: Yes (comment) ?Current home services: DME [facility]) ?Criminal Activity/Legal Involvement Pertinent to Current Situation/Hospitalization: No - Comment as needed ? ?Activities of Daily Living ?Home Assistive Devices/Equipment: Cane (specify quad or straight) ?ADL Screening (condition at time of admission) ?Patient's cognitive ability adequate to safely complete daily activities?: Yes ?Is the patient deaf or have difficulty hearing?: No ?Does the patient have difficulty seeing, even when wearing glasses/contacts?: No ?Does the patient have difficulty concentrating, remembering, or making decisions?: No ?Patient able to express need for assistance with ADLs?: Yes ?Does the patient have difficulty dressing or bathing?: No ?Independently performs ADLs?: Yes (appropriate for developmental age) ?Does the patient have difficulty walking or climbing stairs?: Yes ?Weakness of Legs: None ?Weakness of Arms/Hands: None ? ?Permission Sought/Granted ?Permission sought to share information with : Case Manager, Magazine features editor, Family Supports ?Permission granted to share information with : Yes, Verbal Permission Granted ?   ?   ?   ?   ? ?Emotional Assessment ?Appearance:: Appears older than stated age ?Attitude/Demeanor/Rapport: Gracious ?Affect (typically observed): Calm ?Orientation: : Oriented to Self, Oriented to Place ?Alcohol / Substance Use: Illicit Drugs (THC) ?Psych Involvement: No (comment) ? ?Admission diagnosis:  Anasarca [R60.1] ?COPD exacerbation (HCC) [J44.1] ?Acute on chronic respiratory failure with hypoxia (HCC) [J96.21] ?Hypervolemia, unspecified hypervolemia type [E87.70] ?Heart failure, unspecified HF chronicity, unspecified heart failure type (  HCC) [I50.9] ?Patient Active Problem List  ? Diagnosis Date Noted  ? Acute heart failure with preserved ejection fraction (HFpEF) (HCC) 02/07/2022  ? Acute right-sided congestive heart failure (HCC)   ? Acute encephalopathy   ? Atherosclerosis of  aorta (HCC) 02/05/2022  ? COPD (chronic obstructive pulmonary disease) (HCC) 02/04/2022  ? Thoracic ascending aortic aneurysm (HCC) 11/17/2021  ? Acute respiratory failure with hypoxia and hypercapnia (HCC) 10/28/2021  ? Hyperammonemia (HCC) 10/28/2021  ? Morbid obesity with BMI of 40.0-44.9, adult (HCC) 10/28/2021  ? Primary localized osteoarthritis of left hip 10/30/2018  ? Osteoarthritis of left hip 10/30/2018  ? Chronic pain syndrome   ? Uncomplicated opioid dependence (HCC)   ? Encounter for chronic pain management 10/28/2014  ? Constipation 10/28/2014  ? Numbness and tingling in left arm 09/02/2014  ? Gastric ulcer with hemorrhage 08/25/2014  ? Knee pain 05/19/2014  ? Allergy to bee sting 04/08/2014  ? Anxiety associated with depression 04/08/2014  ? Restless leg syndrome 10/22/2013  ? Tobacco abuse 09/11/2012  ? Brachial neuritis or radiculitis 12/07/2010  ? Essential hypertension 10/26/2010  ? ALLERGIC RHINITIS 10/26/2010  ? ?PCP:  [facility] ?Pharmacy:   ?[facility] - [place], [place] - [address] ?[address] ?[place] [address] ?Phone: [phone] Fax: [phone] ? ? ? ? ?Social Determinants of Health (SDOH) Interventions ?  ? ?Readmission Risk Interventions ?   ? View : No data to display.  ?  ?  ?  ? ? ? ?

## 2022-02-09 NOTE — Progress Notes (Signed)
? ?NAME:  Trevor Thomas, MRN:  [id number], DOB:  09-12-1966, LOS: 4 ?ADMISSION DATE:  02/04/2022, CONSULTATION DATE:  5/7 ?REFERRING MD:  Dr. Leary Roca, CHIEF COMPLAINT:  Acute Respiratory Failure w/ hypoxia and hypercarbia  ? ?History of Present Illness:  ?56 year old male with pertinent PMH of COPD, tobacco abuse, HFpEF, polysubstance abuse, chronic pain syndrome on Suboxone presents to [facility] ED on 5/5 with SOB. ? ?On 5/5 patient presented to [facility] with progressive SOB and increased leg swelling.  Patient's vital stable.  Afebrile.  Sats 99% on [place].  Does not wear O2 at home.  WBC WNL.  BNP 207.  VBG 7.39, 72, 46, 44 (likely chronic hypercarbia).  Patient admitted to floors with family medicine for likely CHF exacerbation.  Patient refused BiPAP due to anxiety.  Was stable on .  Started on IV Lasix.  Started on home Suboxone. ? ?On 5/6, Echo performed showing EF greater than 75%; grade 1 diastolic dysfunction; cor pulmonale.  Cards consulted.  On 5/7 cards increase Lasix from 40 twice daily to 80 twice daily due to low UOP.  Later in the evening, patient very obtunded and hypoxic.  GCS initially less than 8.  Placed on 15 L salter HFNC.  Rapid response called.  Patient given Narcan with improvement in mental status.  ABG drawn: pH 7.2, PCO2 123, PaO2 61.  Patient placed on BiPAP.  Given additional 40 Lasix.  CXR showing increased infiltrates in RLL possible aspiration.  Started on Unasyn.  UDS positive for THC. ? ?Pertinent  Medical History  ?ETOH Abuse ?Polysubstance Abuse  ?Wears Dentures  ?Chronic Pain - on suboxone  ?GIB  ?COPD  ?Depression / Anxiety  ?Prior Suicidal Ideation ?Anisocoria  ?DDD / Osteoarthritis  ? ?Significant Hospital Events: ?Including procedures, antibiotic start and stop dates in addition to other pertinent events   ?5/5 Admitted to [facility] for SOB ?5/7 PCCM consulted for acute encephalopathy & respiratory distress.  Pt placed on BiPAP & given Narcan. ?5/8 ABG compensated, PCO2 118, decompensated  afternoon requiring bipap. CTA Chest neg for PE.  ?5/8 patient intubated for worsening hypercarbia ? ?Interim History / Subjective:  ?On minimal sedation and cooperative. ? ?Objective   ?Blood pressure 108/69, pulse 89, temperature 99 ?F (37.2 ?C), temperature source Oral, resp. rate 16, height 6\' 1"  (1.854 m), weight 121.6 kg, SpO2 92 %. ?   ?Vent Mode: PRVC ?FiO2 (%):  [40 %-50 %] 40 % ?Set Rate:  [16 bmp] 16 bmp ?Vt Set:  [480 mL] 480 mL ?PEEP:  [10 cmH20] 10 cmH20 ?Plateau Pressure:  [15 cmH20-22 cmH20] 15 cmH20  ? ?Intake/Output Summary (Last 24 hours) at 02/09/2022 0857 ?Last data filed at 02/09/2022 0700 ?Gross per 24 hour  ?Intake 1946.28 ml  ?Output 4890 ml  ?Net -2943.72 ml  ? ? ?Filed Weights  ? 02/07/22 0131 02/08/22 0156 02/09/22 0442  ?Weight: 125.7 kg 121.8 kg 121.6 kg  ? ? ?Examination: ?General: chronically ill appearing adult male lying in bed in NAD ?HEENT: MM pink/moist, ETT, pupils equal/reactive, anicteric  ?Neuro: Cooperative, follows commands no focal deficits. ?CV: s1s2 RRR, SR on monitor, no m/r/g ?PULM: Clear to auscultation bilaterally.  Double stacking on ventilator corrected by increasing nighttime.  Still too somnolent to wean effectively. ?GI: soft, bsx4 active, gastric tube in place  ?Extremities: warm/dry, edema has resolved. ?Skin: no rashes or lesions ? ?Resolved Hospital Problem list   ?Encephalopathy ? ?Assessment & Plan:  ?Acute heart failure with preserved ejection fraction (HFpEF) (HCC) ?JVP not elevated,  bilateral interstitial infiltrates on chest x-ray with widened vascular pedicle.  Bilateral leg edema much improved ? ?-Decrease furosemide.  Acetazolamide for metabolic alkalosis. ? ?Acute respiratory failure with hypoxia and hypercapnia (HCC) ?Acute on chronic hypercarbic respiratory failure.  Normal pH with elevated CO2 speaks to chronicity. ?Unable to adequately correct with BiPAP as patient noncompliant.  ?O2 has corrected promptly with mechanical ventilation. ?Decreasing  oxygen requirements, but saturations remain marginal. ? ?-Decrease sedation and commence SBT. ?-Wean PEEP to 5.  Maintain sats greater than 88%.  Patient is likely chronically hypoxic at home. ? ?COPD (chronic obstructive pulmonary disease) (HCC) ?No wheezing on examination ? ?-Continue bronchodilators, inhaled steroids. ?-Avoid IV steroids for now given volume overload. ? ?Anasarca ?Due to right heart failure. ? ?-Furosemide 80 twice daily ? ?Acute right-sided congestive heart failure (HCC) ?Significant RV dysfunction new since last echocardiogram several years ago.  LV systolic function preserved. ? ?-Correction of hypercarbia and hypoxia should help RV function.  Currently on no inotropic support. ? ? ?Acute encephalopathy ?Due to combination of hypercarbia, hypoxia.  Currently calm on minimal sedation ? ?-Continue to wean sedation targeting RASS 0, -1 in preparation for potential extubation today. ? ?Best Practice (right click and "Reselect all SmartList Selections" daily)  ?Diet/type: tubefeeds and NPO ?DVT prophylaxis: prophylactic heparin  ?GI prophylaxis: PPI ?Lines: N/A ?Foley:  N/A ?Code Status:  full code ?Last date of multidisciplinary goals of care discussion: Significant other, Otho Najjar, updated 5/9 on plan of care.  She shares he has left AMA in past, medically non-compliant.   ? ?CRITICAL CARE ?Performed by: Lynnell Catalan ? ? ?Total critical care time: 40 minutes ? ?Critical care time was exclusive of separately billable procedures and treating other patients. ? ?Critical care was necessary to treat or prevent imminent or life-threatening deterioration. ? ?Critical care was time spent personally by me on the following activities: development of treatment plan with patient and/or surrogate as well as nursing, discussions with consultants, evaluation of patient's response to treatment, examination of patient, obtaining history from patient or surrogate, ordering and performing treatments and  interventions, ordering and review of laboratory studies, ordering and review of radiographic studies, pulse oximetry, re-evaluation of patient's condition and participation in multidisciplinary rounds. ? ?Lynnell Catalan, MD FRCPC ?ICU Physician ?[facility]  ?Pager: [phone] ?Mobile: [phone] ?After hours: [phone]. ? ? ? ? ?

## 2022-02-10 DIAGNOSIS — J9601 Acute respiratory failure with hypoxia: Secondary | ICD-10-CM | POA: Diagnosis not present

## 2022-02-10 DIAGNOSIS — J9602 Acute respiratory failure with hypercapnia: Secondary | ICD-10-CM | POA: Diagnosis not present

## 2022-02-10 DIAGNOSIS — I50811 Acute right heart failure: Secondary | ICD-10-CM | POA: Diagnosis not present

## 2022-02-10 LAB — BASIC METABOLIC PANEL
Anion gap: 9 (ref 5–15)
BUN: 11 mg/dL (ref 6–20)
CO2: 38 mmol/L — ABNORMAL HIGH (ref 22–32)
Calcium: 9.4 mg/dL (ref 8.9–10.3)
Chloride: 88 mmol/L — ABNORMAL LOW (ref 98–111)
Creatinine, Ser: 0.56 mg/dL — ABNORMAL LOW (ref 0.61–1.24)
GFR, Estimated: >60 mL/min (ref >60)
Glucose, Bld: 84 mg/dL (ref 70–99)
Potassium: 4.2 mmol/L (ref 3.5–5.1)
Sodium: 135 mmol/L (ref 135–145)

## 2022-02-10 LAB — GLUCOSE, CAPILLARY
Glucose-Capillary: 82 mg/dL (ref 70–99)
Glucose-Capillary: 87 mg/dL (ref 70–99)
Glucose-Capillary: 143 mg/dL — ABNORMAL HIGH (ref 70–99)
Glucose-Capillary: 165 mg/dL — ABNORMAL HIGH (ref 70–99)
Glucose-Capillary: 129 mg/dL — ABNORMAL HIGH (ref 70–99)
Glucose-Capillary: 144 mg/dL — ABNORMAL HIGH (ref 70–99)

## 2022-02-10 MED ORDER — UMECLIDINIUM BROMIDE 62.5 MCG/ACT IN AEPB
1.0000 | INHALATION_SPRAY | Freq: Every day | RESPIRATORY_TRACT | Status: DC
  Administered 2022-02-11 – 2022-02-12 (×2): 1 via RESPIRATORY_TRACT
  Filled 2022-02-10: qty 7

## 2022-02-10 MED ORDER — SENNOSIDES-DOCUSATE SODIUM 8.6-50 MG PO TABS
1.0000 | ORAL_TABLET | Freq: Two times a day (BID) | ORAL | Status: DC
  Administered 2022-02-10: 1 via ORAL
  Filled 2022-02-10 (×2): qty 1

## 2022-02-10 MED ORDER — INSULIN ASPART 100 UNIT/ML IJ SOLN
0.0000 [IU] | Freq: Three times a day (TID) | INTRAMUSCULAR | Status: DC
  Administered 2022-02-11: 1 [IU] via SUBCUTANEOUS

## 2022-02-10 MED ORDER — ADULT MULTIVITAMIN W/MINERALS CH
1.0000 | ORAL_TABLET | Freq: Every day | ORAL | Status: DC
  Administered 2022-02-10 – 2022-02-12 (×3): 1 via ORAL
  Filled 2022-02-10 (×3): qty 1

## 2022-02-10 MED ORDER — FUROSEMIDE 40 MG PO TABS
40.0000 mg | ORAL_TABLET | Freq: Every day | ORAL | Status: DC
  Administered 2022-02-10 – 2022-02-12 (×3): 40 mg via ORAL
  Filled 2022-02-10 (×3): qty 1

## 2022-02-10 MED ORDER — BISACODYL 10 MG RE SUPP
10.0000 mg | Freq: Once | RECTAL | Status: DC
  Filled 2022-02-10: qty 1

## 2022-02-10 MED ORDER — MOMETASONE FURO-FORMOTEROL FUM 100-5 MCG/ACT IN AERO
2.0000 | INHALATION_SPRAY | Freq: Two times a day (BID) | RESPIRATORY_TRACT | Status: DC
  Administered 2022-02-10 – 2022-02-12 (×4): 2 via RESPIRATORY_TRACT
  Filled 2022-02-10: qty 8.8

## 2022-02-10 MED ORDER — POLYETHYLENE GLYCOL 3350 17 G PO PACK
17.0000 g | PACK | Freq: Every day | ORAL | Status: DC
  Administered 2022-02-10: 17 g via ORAL
  Filled 2022-02-10: qty 1

## 2022-02-10 MED ORDER — ENSURE ENLIVE PO LIQD
237.0000 mL | Freq: Two times a day (BID) | ORAL | Status: DC
  Administered 2022-02-10: 237 mL via ORAL

## 2022-02-10 NOTE — Progress Notes (Addendum)
? ?NAME:  Trevor Thomas, MRN:  [id number], DOB:  Jan 25, 1966, LOS: 5 ?ADMISSION DATE:  02/04/2022, CONSULTATION DATE:  5/7 ?REFERRING MD:  Dr. Leary Roca, CHIEF COMPLAINT:  Acute Respiratory Failure w/ hypoxia and hypercarbia  ? ?History of Present Illness:  ?56 year old male with pertinent PMH of COPD, tobacco abuse, HFpEF, polysubstance abuse, chronic pain syndrome on Suboxone presents to [facility] ED on 5/5 with SOB. ? ?On 5/5 patient presented to [facility] with progressive SOB and increased leg swelling.  Patient's vital stable.  Afebrile.  Sats 99% on [place].  Does not wear O2 at home.  WBC WNL.  BNP 207.  VBG 7.39, 72, 46, 44 (likely chronic hypercarbia).  Patient admitted to floors with family medicine for likely CHF exacerbation.  Patient refused BiPAP due to anxiety.  Was stable on [place].  Started on IV Lasix.  Started on home Suboxone. ? ?On 5/6, Echo performed showing EF greater than 75%; grade 1 diastolic dysfunction; cor pulmonale.  Cards consulted.  On 5/7 cards increase Lasix from 40 twice daily to 80 twice daily due to low UOP.  Later in the evening, patient very obtunded and hypoxic.  GCS initially less than 8.  Placed on 15 L salter HFNC.  Rapid response called.  Patient given Narcan with improvement in mental status.  ABG : pH 7.2, PCO2 123, PaO2 61.  Patient placed on BiPAP.  Given additional 40 Lasix.  CXR showing increased infiltrates in RLL possible aspiration.  Started on Unasyn.  UDS positive for THC. ? ?Pertinent  Medical History  ?ETOH Abuse ?Polysubstance Abuse  ?Wears Dentures  ?Chronic Pain - on suboxone  ?GIB  ?COPD  ?Depression / Anxiety  ?Prior Suicidal Ideation ?Anisocoria  ?DDD / Osteoarthritis  ? ?Significant Hospital Events: ?Including procedures, antibiotic start and stop dates in addition to other pertinent events   ?5/5 Admitted to [facility] for SOB ?5/7 PCCM consulted for acute encephalopathy & respiratory distress.  Pt placed on BiPAP & given Narcan. ?5/8 ABG compensated, PCO2 118, decompensated afternoon  requiring bipap. CTA Chest neg for PE.  ?5/8 patient intubated for worsening hypercarbia ? ?Interim History / Subjective:  ?Extubated yesterday. No sig events overnight. Wore CPAP overnight.  ? ?Objective   ?Blood pressure 107/77, pulse 99, temperature 98.3 ?F (36.8 ?C), temperature source Axillary, resp. rate 14, height 6\' 1"  (1.854 m), weight 115.6 kg, SpO2 92 %. ?   ?Vent Mode: PSV;CPAP ?FiO2 (%):  [40 %-60 %] 50 % ?PEEP:  [5 cmH20] 5 cmH20 ?Pressure Support:  [5 cmH20] 5 cmH20  ? ?Intake/Output Summary (Last 24 hours) at 02/10/2022 0851 ?Last data filed at 02/10/2022 0700 ?Gross per 24 hour  ?Intake 1058.43 ml  ?Output 2815 ml  ?Net -1756.57 ml  ? ?Filed Weights  ? 02/08/22 0156 02/09/22 0442 02/10/22 0347  ?Weight: 121.8 kg 121.6 kg 115.6 kg  ? ? ?Examination: ?General: chronically ill appearing adult male lying in bed in NAD ?HEENT: MM pink/moist, pupils equal/reactive, anicteric  ?Neuro: awake, alert, appropriate, MAE ?CV: s1s2 RRR, SR on monitor, no m/r/g ?PULM: resps even non labored on simple mask, few scattered rhonchi otherwise clear  ?GI: soft, bsx4 active, gastric tube in place  ?Extremities: warm/dry, scant BLE edema  ?Skin: no rashes or lesions ? ?Resolved Hospital Problem list   ?Encephalopathy ? ?Assessment & Plan:  ?Acute hypoxic and acute on chronic hypercarbic respiratory failure - multifactorial in setting acute R sided heart failure, anasarca with underlying COPD +/- aspiration PNA, ?sinusitis.  Net NEG 12L 5/11 ?PLAN -  ?  Continue diuresis with furosemide, s/p acetazolamide x2 doses 5/10 ?Supplemental O2 as needed to maintain sats >92%  ?Bipap PRN  ?Mobilize, OOB  ?Pulmonary hygiene  ?F/u CXR  ?Continue dulera, incruse, PRN albuterol  ?Holding IV steroids for now  ?D6/7 unasyn - stop date in place  ? ?Acute R sided heart failure  ?PLAN -  ?Continue gentle diuresis  ?Cards following peripherally - ?cath this admit  ?F/u chem  ? ?Acute encephalopathy - improved  ?PLAN -  ?Supportive care   ?Mobilize  ? ?Hx polysubstance abuse - on suboxone at home  ?PLAN -  ?Holding suboxone for now  ? ?Best Practice (right click and "Reselect all SmartList Selections" daily)  ?Diet/type: full liquids  ?DVT prophylaxis: LMWH ?GI prophylaxis: PPI ?Lines: N/A ?Foley:  N/A ?Code Status:  full code ?Last date of multidisciplinary goals of care discussion: Significant other, Otho Najjar, updated 5/9 on plan of care.  She shares he has left AMA in past, medically non-compliant.   ? ?CRITICAL CARE ?Performed by: Danford Bad ? ? ?Total critical care time: 32 minutes ? ?Critical care time was exclusive of separately billable procedures and treating other patients. ? ?Critical care was necessary to treat or prevent imminent or life-threatening deterioration. ? ?Critical care was time spent personally by me on the following activities: development of treatment plan with patient and/or surrogate as well as nursing, discussions with consultants, evaluation of patient's response to treatment, examination of patient, obtaining history from patient or surrogate, ordering and performing treatments and interventions, ordering and review of laboratory studies, ordering and review of radiographic studies, pulse oximetry, re-evaluation of patient's condition and participation in multidisciplinary rounds. ? ?Dirk Dress, NP ?Pulmonary/Critical Care Medicine  ?02/10/2022  8:51 AM ? ? ? ? ? ?

## 2022-02-10 NOTE — Progress Notes (Signed)
Placed pt on Cpap of 8 with 4L bleed in. Pt tolerating well. Will continue to monitor.  ?

## 2022-02-10 NOTE — Progress Notes (Signed)
Nutrition Follow-up ? ?DOCUMENTATION CODES:  ? ?Not applicable ? ?INTERVENTION:  ? ?- Ensure Enlive po BID, each supplement provides 350 kcal and 20 grams of protein ? ?- MVI with minerals daily ? ?- Encourage PO intake ? ?NUTRITION DIAGNOSIS:  ? ?Inadequate oral intake related to inability to eat as evidenced by NPO status. ? ?Progressing, pt now on Regular diet ? ?GOAL:  ? ?Patient will meet greater than or equal to 90% of their needs ? ?Progressing ? ?MONITOR:  ? ?PO intake, Supplement acceptance, Labs, Weight trends, I & O's ? ?REASON FOR ASSESSMENT:  ? ?Ventilator, Consult ?Enteral/tube feeding initiation and management ? ?ASSESSMENT:  ? ?56 year old male who presented to the ED on 5/05 with SOB. PMH of HTN, HLD, COPD, polysubstance abuse, EtOH abuse, chronic pain with osteoarthritis, TBI, anxiety, GI bleed. Pt admitted with acute hypoxic respiratory failure likely secondary to CHF vs COPD exacerbation. ? ?05/07 - rapid response ?05/08 - transferred to ICU, intubated ?05/10 - extubated ?05/11 - diet advanced to full liquids, later advanced to Regular ? ?Discussed pt with RN and during ICU rounds. Diet has been advanced to Regular textures. ? ?Spoke with pt at bedside who reports that he is eager to eat. Asked RD to call in lunch order which RD did. Pt willing to try Ensure shakes but is not sure that he will like them. ? ?Admit weight: 132.9 kg ?Current weight: 115.6 kg ? ?Pt with mild pitting generalized edema, non-pitting edema to BUE, and mild pitting edema to BLE. ? ?Medications reviewed and include: dulcolax suppository, lasix 40 mg daily, SSI q 4 hours, miralax, senna, IV abx ? ?Labs reviewed. CBG's: 82-149 x 24 hours ? ?UOP: 2725 ml x 24 hours ?I/O's: -11.9 L since admit ? ?Diet Order:   ?Diet Order   ? ?       ?  Diet regular Room service appropriate? Yes; Fluid consistency: Thin  Diet effective now       ?  ? ?  ?  ? ?  ? ? ?EDUCATION NEEDS:  ? ?Education needs have been addressed ? ?Skin:  Skin  Assessment: Reviewed RN Assessment (MASD coccyx) ? ?Last BM:  02/04/22 ? ?Height:  ? ?Ht Readings from Last 1 Encounters:  ?02/07/22 6\' 1"  (1.854 m)  ? ? ?Weight:  ? ?Wt Readings from Last 1 Encounters:  ?02/10/22 115.6 kg  ? ? ?Ideal Body Weight:  83.6 kg ? ?BMI:  Body mass index is 33.62 kg/m?. ? ?Estimated Nutritional Needs:  ? ?Kcal:  2200-2400 ? ?Protein:  110-130 grams ? ?Fluid:  2.0 L ? ? ? ?Gustavus Bryant, MS, RD, LDN ?Inpatient Clinical Dietitian ?Please see AMiON for contact information. ? ?

## 2022-02-10 NOTE — Progress Notes (Signed)
? ?Progress Note ? ?Patient Name: Zedekiah Hinderman ?Date of Encounter: 02/10/2022 ? ?[facility] Cardiologist: Kirk Ruths, MD  ? ?Subjective  ? ?Looks like a different person today. Sitting comfortably in chair, awake/alert/oriented. Just walked around the unit. Feels that breathing is significantly improved. No chest pain. Asking when he can go home. ? ?Inpatient Medications  ?  ?Scheduled Meds: ? bisacodyl  10 mg Rectal Once  ? chlorhexidine gluconate (MEDLINE KIT)  15 mL Mouth Rinse BID  ? Chlorhexidine Gluconate Cloth  6 each Topical Daily  ? enoxaparin (LOVENOX) injection  60 mg Subcutaneous Q24H  ? feeding supplement  237 mL Oral BID BM  ? furosemide  40 mg Oral Daily  ? insulin aspart  0-9 Units Subcutaneous Q4H  ? mouth rinse  15 mL Mouth Rinse q12n4p  ? mometasone-formoterol  2 puff Inhalation BID  ? multivitamin with minerals  1 tablet Oral Daily  ? nicotine  21 mg Transdermal Daily  ? senna-docusate  1 tablet Oral BID  ? umeclidinium bromide  1 puff Inhalation Daily  ? ?Continuous Infusions: ? sodium chloride 10 mL/hr at 02/10/22 1200  ? sodium chloride    ? ampicillin-sulbactam (UNASYN) IV Stopped (02/10/22 1050)  ? ondansetron (ZOFRAN) IV    ? ?PRN Meds: ?sodium chloride, acetaminophen, albuterol, naLOXone (NARCAN)  injection, ondansetron **OR** ondansetron **OR** ondansetron (ZOFRAN) IV  ? ?Vital Signs  ?  ?Vitals:  ? 02/10/22 1116 02/10/22 1200 02/10/22 1300 02/10/22 1504  ?BP:  117/89 (!) 131/92   ?Pulse: 98 97 92   ?Resp: 16 16 19    ?Temp: 99.5 ?F (37.5 ?C)   98.1 ?F (36.7 ?C)  ?TempSrc: Axillary   Oral  ?SpO2: 92% 92% 90%   ?Weight:      ?Height:      ? ? ?Intake/Output Summary (Last 24 hours) at 02/10/2022 1553 ?Last data filed at 02/10/2022 1400 ?Gross per 24 hour  ?Intake 1055.01 ml  ?Output 3575 ml  ?Net -2519.99 ml  ? ? ?  02/10/2022  ?  3:47 AM 02/09/2022  ?  4:42 AM 02/08/2022  ?  1:56 AM  ?Last 3 Weights  ?Weight (lbs) 254 lb 13.6 oz 268 lb 1.3 oz 268 lb 8.3 oz  ?Weight (kg) 115.6 kg 121.6 kg  121.8 kg  ?   ? ?Telemetry  ?  ?SR/ST - Personally Reviewed ? ?ECG  ?  ?No new since 5/7 - Personally Reviewed ? ?Physical Exam  ? ?GEN: No acute distress.   ?Neck: No JVD ?Cardiac: RRR, no murmurs, rubs, or gallops.  ?Respiratory: Clear to auscultation bilaterally except for rhonchi that improve with cough ?GI: Soft, nontender, non-distended  ?MS: No significant LE edema; No deformity. ?Neuro:  Nonfocal  ?Psych: Normal affect  ? ?Labs  ?  ?High Sensitivity Troponin:   ?Recent Labs  ?Lab 02/04/22 ?1541 02/04/22 ?2140  ?TROPONINIHS 8 7  ?   ?Chemistry ?Recent Labs  ?Lab 02/04/22 ?1541 02/04/22 ?2140 02/05/22 ?0140 02/08/22 ?3254 02/08/22 ?9826 02/08/22 ?1404 02/09/22 ?4158 02/09/22 ?1739 02/10/22 ?0856  ?NA 129* 132*   < > 135  --  137 136  --  135  ?K 4.8 3.8   < > 5.4*   < > 3.2* 5.2*  --  4.2  ?CL 86* 86*   < > 82*  --  81* 85*  --  88*  ?CO2 34* 33*   < > 42*  --  >45* 44*  --  38*  ?GLUCOSE 100* 208*   < >  87  --  95 116*  --  84  ?BUN 8 8   < > 18  --  16 18  --  11  ?CREATININE 0.73 0.80   < > 0.80  --  0.67 0.60*  --  0.56*  ?CALCIUM 8.4* 8.8*   < > 8.7*  --  8.9 8.9  --  9.4  ?MG  --   --    < > 2.1   < > 2.1 2.1 2.1  --   ?PROT 5.8* 6.7  --   --   --   --   --   --   --   ?ALBUMIN 2.7* 2.9*  --   --   --   --   --   --   --   ?AST 31 15  --   --   --   --   --   --   --   ?ALT 6 7  --   --   --   --   --   --   --   ?ALKPHOS 74 81  --   --   --   --   --   --   --   ?BILITOT 1.1 0.7  --   --   --   --   --   --   --   ?GFRNONAA >60 >60   < > >60  --  >60 >60  --  >60  ?ANIONGAP 9 13   < > 11  --   --  7  --  9  ? < > = values in this interval not displayed.  ?  ?Lipids  ?Recent Labs  ?Lab 02/05/22 ?1006 02/08/22 ?0420  ?CHOL 135  --   ?TRIG 83 143  ?HDL 34*  --   ?LDLCALC 84  --   ?CHOLHDL 4.0  --   ?  ?Hematology ?Recent Labs  ?Lab 02/07/22 ?0218 02/07/22 ?1503 02/08/22 ?0406 02/08/22 ?0420 02/09/22 ?3212  ?WBC 6.4  --   --  7.5 5.5  ?RBC 5.95*  --   --  6.33* 5.99*  ?HGB 14.7   < > 19.0* 15.5 14.6  ?HCT  54.3*   < > 56.0* 56.5* 52.8*  ?MCV 91.3  --   --  89.3 88.1  ?MCH 24.7*  --   --  24.5* 24.4*  ?MCHC 27.1*  --   --  27.4* 27.7*  ?RDW 18.1*  --   --  18.3* 19.2*  ?PLT 153  --   --  147* 261  ? < > = values in this interval not displayed.  ? ?Thyroid No results for input(s): TSH, FREET4 in the last 168 hours.  ?BNP ?Recent Labs  ?Lab 02/04/22 ?1541  ?BNP 207.1*  ?  ?DDimer  ?Recent Labs  ?Lab 02/07/22 ?0218  ?DDIMER 0.75*  ?  ? ?Radiology  ?  ?DG CHEST PORT 1 VIEW ? ?Result Date: 02/09/2022 ?CLINICAL DATA:  Shortness of breath.  Acute respiratory failure. EXAM: PORTABLE CHEST 1 VIEW COMPARISON:  02/08/2022 FINDINGS: Endotracheal tube tip terminates approximately 5.5 cm above the carina. Enteric tube again descends below the diaphragm with the tip excluded by collimation. Cardiac silhouette is again mildly enlarged. Mediastinal contours within normal limits. Mild diffuse bilateral interstitial thickening is unchanged from 02/08/2022 and 10/28/2021. Mild bibasilar subsegmental atelectasis appears similar to multiple radiographs including 10/28/2021. Possible blunting of the left costophrenic angle, unchanged from prior. No pneumothorax. No acute skeletal  abnormality. IMPRESSION:: IMPRESSION: 1. Support apparatus in appropriate position. 2. Unchanged mild cardiomegaly. 3. No definite pulmonary edema. Unchanged chronic mild bilateral interstitial thickening with mild bibasilar atelectasis. Electronically Signed   By: Yvonne Kendall M.D.   On: 02/09/2022 08:04   ? ?Cardiac Studies  ? ?Echo 02/05/22 ?1. Left ventricular ejection fraction, by estimation, is >75%. The left  ?ventricle has hyperdynamic function. The left ventricle has no regional  ?wall motion abnormalities. There is mild left ventricular hypertrophy.  ?Left ventricular diastolic parameters  ?are consistent with Grade I diastolic dysfunction (impaired relaxation).  ?There is the interventricular septum is flattened in systole and diastole,  ?consistent with  right ventricular pressure and volume overload.  ? 2. Right ventricular systolic function is severely reduced. The right  ?ventricular size is severely enlarged. There is moderately elevated  ?pulmonary artery systolic pressure.  ? 3. Right atrial size was severely dilated.  ? 4. A small pericardial effusion is present.  ? 5. The mitral valve is normal in structure. No evidence of mitral valve  ?regurgitation. No evidence of mitral stenosis.  ? 6. The aortic valve is tricuspid. Aortic valve regurgitation is not  ?visualized. Aortic valve sclerosis/calcification is present, without any  ?evidence of aortic stenosis.  ? 7. Aortic dilatation noted. 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  ? 8. The inferior vena cava is dilated in size with <50% respiratory  ?variability, suggesting right atrial pressure of 15 mmHg.  ? ?Patient Profile  ?   ?56 y.o. male with cor pulmonale, HTN, polysubstance abuse, chronic pain syndrome on suboxone, COPD, possible hx of OSA presented to [facility] 5/5 with progressive SOB and increased leg swelling. ? ?Assessment & Plan  ?  ?Acute on chronic hypoxic and hypercapnic respiratory failure ?Pulmonary hypertension/cor pulmonale ?-no PE ?-see my note from 5/8, does not suggest diastolic dysfunction as etiology ?-would like right heart cath for further evaluation of his right heart failure. He would like to go home ASAP. This can be done as an outpatient if he is otherwise stable for discharge. ?-wt 132.9 kg on admission, 115.6 kg today. Net negative ~13 L ?-clinically, volume status improved. Agree with transition to oral lasix ? ?Discussed with Dr. Lynetta Mare. ? ?[facility] will sign off.   ?Medication Recommendations:   ?New medications at discharge: ?Furosemide 40 mg daily ? ?HOLD ?Amlodipine-benazepril (due to hypotension) until BP can be reassessed at follow up ? ?Other recommendations (labs, testing, etc):  consider right heart cath as an outpatient. ?Follow up as  an outpatient:  We will arrange for post hospital follow up with cardiology. ? ?For questions or updates, please contact [place] ?Please consult [email or web address] for contact info under  ? ?  ?   ?S

## 2022-02-10 NOTE — Evaluation (Signed)
Physical Therapy Evaluation ?Patient Details ?Name: Trevor Thomas ?MRN: [id number] ?DOB: 1966/08/14 ?Today's Date: 02/10/2022 ? ?History of Present Illness ? Pt adm 5/5 with SOB and LE edema. Pt with acute hypoxic respiratory failure and rt sided heart failure. On 5/7 pt with acute encephalopathy and respiratory distress. Pt placed on bipap and given Narcan. Pt intubated 5/8 for worsening hypercarbia. Extubated 5/10. PMH - polysubstance abuse, chronic pain, depression, anxiety, OA, COPD.  ?Clinical Impression ? Pt presents to PT with slightly unsteady gait due to illness and inactivity. Expect pt will make good progress back to baseline with mobility. Expect pt will be able to return home with some intermittent support.   ?   ?   ? ?Recommendations for follow up therapy are one component of a multi-disciplinary discharge planning process, led by the attending physician.  Recommendations may be updated based on patient status, additional functional criteria and insurance authorization. ? ?Follow Up Recommendations Home health PT ? ?  ?Assistance Recommended at Discharge Intermittent Supervision/Assistance  ?Patient can return home with the following ? Help with stairs or ramp for entrance;A little help with walking and/or transfers;A little help with bathing/dressing/bathroom;Assistance with cooking/housework ? ?  ?Equipment Recommendations None recommended by PT  ?Recommendations for Other Services ?    ?  ?Functional Status Assessment Patient has had a recent decline in their functional status and demonstrates the ability to make significant improvements in function in a reasonable and predictable amount of time.  ? ?  ?Precautions / Restrictions Precautions ?Precautions: Fall  ? ?  ? ?Mobility ? Bed Mobility ?Overal bed mobility: Needs Assistance ?Bed Mobility: Supine to Sit ?  ?  ?Supine to sit: Min assist, HOB elevated ?  ?  ?General bed mobility comments: Assist to elevate trunk into sitting and bring hips to EOB ?   ? ?Transfers ?Overall transfer level: Needs assistance ?Equipment used: Rolling walker (2 wheels) ?Transfers: Sit to/from Stand, Bed to chair/wheelchair/BSC ?Sit to Stand: Mod assist, +2 safety/equipment, Min assist ?  ?Step pivot transfers: Min assist, +2 safety/equipment ?  ?  ?  ?General transfer comment: Initial stand with mod assist to power up and for balance. Subsequent stand with min assist. Verbal cues for hand placement ?  ? ?Ambulation/Gait ?Ambulation/Gait assistance: Min assist, +2 safety/equipment ?Gait Distance (Feet): 150 Feet ?Assistive device: Rolling walker (2 wheels) ?Gait Pattern/deviations: Step-through pattern, Decreased stride length ?Gait velocity: decr ?Gait velocity interpretation: 1.31 - 2.62 ft/sec, indicative of limited community ambulator ?  ?General Gait Details: Assist for balance and support. Improving stability with increased distance ? ?Stairs ?  ?  ?  ?  ?  ? ?Wheelchair Mobility ?  ? ?Modified Rankin (Stroke Patients Only) ?  ? ?  ? ?Balance Overall balance assessment: Needs assistance ?Sitting-balance support: No upper extremity supported, Feet supported ?Sitting balance-Leahy Scale: Fair ?  ?  ?Standing balance support: Bilateral upper extremity supported, During functional activity ?Standing balance-Leahy Scale: Poor ?Standing balance comment: walker and min guard for static standing ?  ?  ?  ?  ?  ?  ?  ?  ?  ?  ?  ?   ? ? ? ?Pertinent Vitals/Pain Pain Assessment ?Pain Assessment: No/denies pain  ? ? ?Home Living Family/patient expects to be discharged to:: Private residence ?Living Arrangements: Non-relatives/Friends;Spouse/significant other ?Available Help at Discharge: Available PRN/intermittently;Friend(s) ?Type of Home: Mobile home ?Home Access: Stairs to enter ?Entrance Stairs-Rails: Right;Left ?Entrance Stairs-Number of Steps: 3 ?  ?Home Layout: One level ?Home Equipment:  Rolling Walker (2 wheels) ?   ?  ?Prior Function Prior Level of Function : Independent/Modified  Independent ?  ?  ?  ?  ?  ?  ?Mobility Comments: No assistive device ?  ?  ? ? ?Hand Dominance  ?   ? ?  ?Extremity/Trunk Assessment  ? Upper Extremity Assessment ?Upper Extremity Assessment: Defer to OT evaluation ?  ? ?Lower Extremity Assessment ?Lower Extremity Assessment: Generalized weakness ?  ? ?   ?Communication  ? Communication: No difficulties  ?Cognition Arousal/Alertness: Awake/alert ?Behavior During Therapy: [facility] for tasks assessed/performed, Flat affect ?Overall Cognitive Status: No family/caregiver present to determine baseline cognitive functioning ?Area of Impairment: Safety/judgement, Problem solving ?  ?  ?  ?  ?  ?  ?  ?  ?  ?  ?  ?  ?Safety/Judgement: Decreased awareness of safety ?  ?Problem Solving: Slow processing ?  ?  ?  ? ?  ?General Comments General comments (skin integrity, edema, etc.): SpO2 90% on ventimask ? ?  ?Exercises    ? ?Assessment/Plan  ?  ?PT Assessment Patient needs continued PT services  ?PT Problem List Decreased strength;Decreased activity tolerance;Decreased balance;Decreased mobility;Decreased safety awareness ? ?   ?  ?PT Treatment Interventions DME instruction;Gait training;Stair training;Functional mobility training;Therapeutic activities;Therapeutic exercise;Balance training;Patient/family education   ? ?PT Goals (Current goals can be found in the Care Plan section)  ?Acute Rehab PT Goals ?Patient Stated Goal: return home ?PT Goal Formulation: With patient ?Time For Goal Achievement: 02/24/22 ?Potential to Achieve Goals: Good ? ?  ?Frequency Min 3X/week ?  ? ? ?Co-evaluation   ?  ?  ?  ?  ? ? ?  ?AM-PAC PT "6 Clicks" Mobility  ?Outcome Measure Help needed turning from your back to your side while in a flat bed without using bedrails?: A Little ?Help needed moving from lying on your back to sitting on the side of a flat bed without using bedrails?: A Little ?Help needed moving to and from a bed to a chair (including a wheelchair)?: A Little ?Help needed standing up  from a chair using your arms (e.g., wheelchair or bedside chair)?: A Little ?Help needed to walk in hospital room?: A Little ?Help needed climbing 3-5 steps with a railing? : A Lot ?6 Click Score: 17 ? ?  ?End of Session Equipment Utilized During Treatment: Gait belt;Oxygen ?Activity Tolerance: Patient tolerated treatment well ?Patient left: in chair;with call bell/phone within reach;with chair alarm set ?  ?PT Visit Diagnosis: Unsteadiness on feet (R26.81);Other abnormalities of gait and mobility (R26.89);Muscle weakness (generalized) (M62.81) ?  ? ?Time: [phone] ?PT Time Calculation (min) (ACUTE ONLY): 24 min ? ? ?Charges:   PT Evaluation ?$PT Eval Moderate Complexity: 1 Mod ?PT Treatments ?$Gait Training: 8-22 mins ?  ?   ? ? ?[facility] PT ?Acute Rehabilitation Services ?Office [phone] ? ? ?Angelina Ok [facility] ?02/10/2022, 12:15 PM ? ?

## 2022-02-11 ENCOUNTER — Inpatient Hospital Stay (HOSPITAL_COMMUNITY): Payer: Medicaid Other

## 2022-02-11 DIAGNOSIS — I50811 Acute right heart failure: Secondary | ICD-10-CM | POA: Diagnosis not present

## 2022-02-11 DIAGNOSIS — Z7189 Other specified counseling: Secondary | ICD-10-CM | POA: Diagnosis not present

## 2022-02-11 DIAGNOSIS — J449 Chronic obstructive pulmonary disease, unspecified: Secondary | ICD-10-CM | POA: Diagnosis not present

## 2022-02-11 DIAGNOSIS — J9621 Acute and chronic respiratory failure with hypoxia: Secondary | ICD-10-CM | POA: Diagnosis not present

## 2022-02-11 DIAGNOSIS — I2609 Other pulmonary embolism with acute cor pulmonale: Secondary | ICD-10-CM | POA: Diagnosis not present

## 2022-02-11 DIAGNOSIS — Z515 Encounter for palliative care: Secondary | ICD-10-CM | POA: Diagnosis not present

## 2022-02-11 LAB — GLUCOSE, CAPILLARY
Glucose-Capillary: 127 mg/dL — ABNORMAL HIGH (ref 70–99)
Glucose-Capillary: 117 mg/dL — ABNORMAL HIGH (ref 70–99)
Glucose-Capillary: 104 mg/dL — ABNORMAL HIGH (ref 70–99)
Glucose-Capillary: 158 mg/dL — ABNORMAL HIGH (ref 70–99)

## 2022-02-11 LAB — BASIC METABOLIC PANEL
Anion gap: 10 (ref 5–15)
BUN: 15 mg/dL (ref 6–20)
CO2: 34 mmol/L — ABNORMAL HIGH (ref 22–32)
Calcium: 8.9 mg/dL (ref 8.9–10.3)
Chloride: 90 mmol/L — ABNORMAL LOW (ref 98–111)
Creatinine, Ser: 0.48 mg/dL — ABNORMAL LOW (ref 0.61–1.24)
GFR, Estimated: >60 mL/min (ref >60)
Glucose, Bld: 106 mg/dL — ABNORMAL HIGH (ref 70–99)
Potassium: 4.2 mmol/L (ref 3.5–5.1)
Sodium: 134 mmol/L — ABNORMAL LOW (ref 135–145)

## 2022-02-11 LAB — CBC
HCT: 52.3 % — ABNORMAL HIGH (ref 39.0–52.0)
Hemoglobin: 15 g/dL (ref 13.0–17.0)
MCH: 25 pg — ABNORMAL LOW (ref 26.0–34.0)
MCHC: 28.7 g/dL — ABNORMAL LOW (ref 30.0–36.0)
MCV: 87.2 fL (ref 80.0–100.0)
Platelets: 203 10*3/uL (ref 150–400)
RBC: 6 MIL/uL — ABNORMAL HIGH (ref 4.22–5.81)
RDW: 17.8 % — ABNORMAL HIGH (ref 11.5–15.5)
WBC: 5.6 10*3/uL (ref 4.0–10.5)
nRBC: 0 % (ref 0.0–0.2)

## 2022-02-11 LAB — MAGNESIUM: Magnesium: 2 mg/dL (ref 1.7–2.4)

## 2022-02-11 LAB — PHOSPHORUS: Phosphorus: 3.3 mg/dL (ref 2.5–4.6)

## 2022-02-11 MED ORDER — BUPRENORPHINE HCL-NALOXONE HCL 2-0.5 MG SL SUBL
2.0000 | SUBLINGUAL_TABLET | Freq: Two times a day (BID) | SUBLINGUAL | Status: DC
  Administered 2022-02-11 – 2022-02-12 (×2): 2 via SUBLINGUAL
  Filled 2022-02-11 (×2): qty 2

## 2022-02-11 NOTE — TOC Progression Note (Signed)
Transition of Care (TOC) - Progression Note  ? ? ?Patient Details  ?Name: Trevor Thomas ?MRN: [id number] ?Date of Birth: 05-22-1966 ? ?Transition of Care (TOC) CM/SW Contact  ?Tom-Johnson, Renea Ee, RN ?Phone Number: ?02/11/2022, 4:30 PM ? ?Clinical Narrative:    ? ?CM reached out to Attending MD on 3M02 about patient needing home oxygen at discharge d/t patient being on 6L or higher. MD agreed and stated patient will need BIPAP as well. CM took it as a verbal order and spoke with patient about it and he chose [place]. Zach notified and has a form for MD to be filled. CM reached out to MD and patient has been transferred to 6E22 and now under [facility]. CM reached out to them and was told that they are assessing patient to see if he will not need at discharge. Form placed in patient's chart if needed.  ?CM unable to secure home health at this time due to patient's Medicaid Insurance. Awaiting a return call from [facility]. ?CM will continue to follow with needs.  ? ?Expected Discharge Plan: [place] ?Barriers to Discharge: Continued Medical Work up ? ?Expected Discharge Plan and Services ?Expected Discharge Plan: [place] ?  ?Discharge Planning Services: CM Consult ?  ?Living arrangements for the past 2 months: Mobile Home ?                ?  ?  ?  ?  ?  ?  ?  ?  ?  ?  ? ? ?Social Determinants of Health (SDOH) Interventions ?  ? ?Readmission Risk Interventions ?   ? View : No data to display.  ?  ?  ?  ? ? ?

## 2022-02-11 NOTE — Progress Notes (Signed)
Saw pt at bedside today. He is improved compared to earlier, now on 4L oxygen. He has ambulated down the halls without any issues. I asked him how he is feeling off the suboxone. He reports feeling a little agitated and a bit depressed and he is missing his children. He usually takes 4-1mg  films at home BID. He was prescribed it 4 times a day but he weaned himself down to 2 tablets a day. I offered restarting the suboxone while he is in hospital and pt was agreeable. He thinks it will help with his symptoms of agitation. ? ?Prescribed appropriate suboxone and verified correct dosing with on call pharmacist. ? ?Towanda Octave MD  ?PGY3 ?[facility]  ?

## 2022-02-11 NOTE — Progress Notes (Signed)
Physical Therapy Treatment ?Patient Details ?Name: Trevor Thomas ?MRN: [id number] ?DOB: 05/20/1966 ?Today's Date: 02/11/2022 ? ? ?History of Present Illness Pt adm 5/5 with SOB and LE edema. Pt with acute hypoxic respiratory failure and rt sided heart failure. On 5/7 pt with acute encephalopathy and respiratory distress. Pt placed on bipap and given Narcan. Pt intubated 5/8 for worsening hypercarbia. Extubated 5/10. PMH - polysubstance abuse, chronic pain, depression, anxiety, OA, COPD. ? ?  ?PT Comments  ? ? Pt is making good progress towards his goals and is able to wean from 10 L O2 via HFNC to 6L O2 via HFNC with ambulation in hallway. Pt is mod I for bed mobility, supervision for transfers and min guard for ambulation. Pt with one bout of LoB however able to self steady. D/c plan remains appropriate at this time however may progress to no need for therapy. PT to work on high level balance at next session. ? ?   ?Recommendations for follow up therapy are one component of a multi-disciplinary discharge planning process, led by the attending physician.  Recommendations may be updated based on patient status, additional functional criteria and insurance authorization. ? ?Follow Up Recommendations ? Home health PT ?  ?  ?Assistance Recommended at Discharge Intermittent Supervision/Assistance  ?Patient can return home with the following Help with stairs or ramp for entrance;A little help with walking and/or transfers;A little help with bathing/dressing/bathroom;Assistance with cooking/housework ?  ?Equipment Recommendations ? None recommended by PT  ?  ?   ?Precautions / Restrictions Precautions ?Precautions: Fall ?Restrictions ?Weight Bearing Restrictions: No  ?  ? ?Mobility ? Bed Mobility ?Overal bed mobility: Needs Assistance ?Bed Mobility: Supine to Sit ?  ?  ?Supine to sit: HOB elevated, Modified independent (Device/Increase time) ?  ?  ?General bed mobility comments: HoB elevated and use of bed rail to come to  EoB ?  ? ?Transfers ?Overall transfer level: Needs assistance ?Equipment used: Rolling walker (2 wheels) ?Transfers: Sit to/from Stand, Bed to chair/wheelchair/BSC ?Sit to Stand: Supervision ?  ?  ?  ?  ?  ?General transfer comment: supervision for safety, good power up and steadying ?  ? ?Ambulation/Gait ?Ambulation/Gait assistance: Min guard ?Gait Distance (Feet): 600 Feet ?Assistive device: Rolling walker (2 wheels), None ?Gait Pattern/deviations: Step-through pattern, Decreased stride length ?Gait velocity: decr ?Gait velocity interpretation: >2.62 ft/sec, indicative of community ambulatory ?  ?General Gait Details: min guard for safety, 1 x LoB requiring reach for handrail in hallway immediately exiting room, steadiness improved with distance ? ? ?  ? ? ?  ?Balance Overall balance assessment: Needs assistance ?Sitting-balance support: No upper extremity supported, Feet supported ?Sitting balance-Leahy Scale: Fair ?  ?  ?Standing balance support: During functional activity, No upper extremity supported ?Standing balance-Leahy Scale: Fair ?Standing balance comment: mild instability however able to self correct ?  ?  ?  ?  ?  ?  ?  ?  ?  ?  ?  ?  ? ?  ?Cognition Arousal/Alertness: Awake/alert ?Behavior During Therapy: [facility] for tasks assessed/performed, Flat affect ?Overall Cognitive Status: No family/caregiver present to determine baseline cognitive functioning ?Area of Impairment: Safety/judgement, Problem solving ?  ?  ?  ?  ?  ?  ?  ?  ?  ?  ?  ?  ?Safety/Judgement: Decreased awareness of deficits ?  ?Problem Solving: Slow processing ?General Comments: decreased awareness of disease process, however receptive to information ?  ?  ? ?  ?   ?General Comments  General comments (skin integrity, edema, etc.): On 10 L O2 via HFNC on entry, SpO2 100%O2, dropped to 85%O2 with intial ambulation however with vc for purse lip breathing rebounds to 96%O2, able to titrate down to 6L O2 via HFNC maintaining SpO2 90-91%O2, HR  104-110 with ambulation ?  ?  ? ?Pertinent Vitals/Pain Pain Assessment ?Pain Assessment: No/denies pain  ? ? ? ?PT Goals (current goals can now be found in the care plan section) Acute Rehab PT Goals ?Patient Stated Goal: return home ?PT Goal Formulation: With patient ?Time For Goal Achievement: 02/24/22 ?Potential to Achieve Goals: Good ?Progress towards PT goals: Progressing toward goals ? ?  ?Frequency ? ? ? Min 3X/week ? ? ? ?  ?PT Plan Current plan remains appropriate  ? ? ?   ?AM-PAC PT "6 Clicks" Mobility   ?Outcome Measure ? Help needed turning from your back to your side while in a flat bed without using bedrails?: A Little ?Help needed moving from lying on your back to sitting on the side of a flat bed without using bedrails?: A Little ?Help needed moving to and from a bed to a chair (including a wheelchair)?: A Little ?Help needed standing up from a chair using your arms (e.g., wheelchair or bedside chair)?: A Little ?Help needed to walk in hospital room?: A Little ?Help needed climbing 3-5 steps with a railing? : A Lot ?6 Click Score: 17 ? ?  ?End of Session Equipment Utilized During Treatment: Gait belt;Oxygen ?Activity Tolerance: Patient tolerated treatment well ?Patient left: in chair;with call bell/phone within reach;with chair alarm set;with nursing/sitter in room ?Nurse Communication: Mobility status;Other (comment) (weaned to 6L O2 via HFNC) ?PT Visit Diagnosis: Unsteadiness on feet (R26.81);Other abnormalities of gait and mobility (R26.89);Muscle weakness (generalized) (M62.81) ?  ? ? ?Time: 4818-5631 ?PT Time Calculation (min) (ACUTE ONLY): 31 min ? ?Charges:  $Therapeutic Exercise: 8-22 mins ?$Therapeutic Activity: 8-22 mins          ?          ? ?Johnney Scarlata B. Beverely Risen PT, DPT ?Acute Rehabilitation Services ?Please use secure chat or  ?Call Office [phone] ? ? ? ?Elon Alas Fleet ?02/11/2022, 12:34 PM ? ?

## 2022-02-11 NOTE — Progress Notes (Signed)
PT awake and alert. PT wore CPAP last two nights, is requesting one night off. On 3L [place], sats 95%. Will continue to monitor.  ?

## 2022-02-11 NOTE — TOC Progression Note (Signed)
Transition of Care (TOC) - Progression Note  ? ? ?Patient Details  ?Name: Trevor Thomas ?MRN: [id number] ?Date of Birth: 06/22/1966 ? ?Transition of Care (TOC) CM/SW Contact  ?Bethena Roys, RN ?Phone Number: ?02/11/2022, 1:57 PM ? ?Clinical Narrative:   Patient transferred to [address] from 53 -M. Previous Case Manager is following for home health services. This Case Manager received a referral for outpatient palliative services. Case Manager discussed the options and patent is agreeable to [facility]. Referral submitted to [facility] and the office will call the patient with a visit time once he returns home. No further needs identified at this time.  ? ? ? ?Expected Discharge Plan: [place] ?Barriers to Discharge: Continued Medical Work up ? ?Expected Discharge Plan and Services ?Expected Discharge Plan: [place] ?  ?Discharge Planning Services: CM Consult ?  ?Living arrangements for the past 2 months: Mobile Home ?                ?  ? Readmission Risk Interventions ?   ? View : No data to display.  ?  ?  ?  ? ? ?

## 2022-02-11 NOTE — Progress Notes (Signed)
Family Medicine Teaching Service ?Daily Progress Note ?Intern Pager: [phone] ? ?Patient name: Trevor Thomas Medical record number: [id number] ?Date of birth: 13-Dec-1965 Age: 56 y.o. Gender: male ? ?Primary Care Provider: [facility] ?Consultants: CCM, Cardiology ?Code Status: Full ? ?Pt Overview and Major Events to Date:  ?5/5 Admitted to [facility] for SOB ?5/7 PCCM consulted for acute encephalopathy & respiratory distress.  Pt placed on BiPAP & given Narcan, transferred to ICU. ?5/8 ABG compensated, PCO2 118, decompensated afternoon requiring bipap. CTA Chest neg for PE.  ?5/8 patient intubated for worsening hypercarbia ?5/10 extubated ?5/11 transitioned to PO lasix ?5/12 transitioned back to FPTS and med-surg ? ? ?Assessment and Plan: ? ?Acute heart failure with HFpEF (grade 1 diastol dysfxn)  Pulmonary HTN  Cor Pulmonale ?Significant RV dysfunction on echocardiogram 02/05/22. ?CXR today w/ patchy bibasilar airspace disease/atelectasis, UOP 1.925 mL, Wt 132.9 kg down to 115.6 kg. Cardiology felt exacerbation 2/2 right heart failure 2/2 severe pulmonary disease. Cardiology recommending RHC in outpatient setting. Patient euvolemic on exam today.  ?-Cardiology following, appreciate recs ?-Lasix 40 mg daily  ? ? ?Acute respiratory failure with hypoxia and hypercapnia (HCC) ?Thought 2/2 aspiration PNA vs COPD, vs CHF. Patient to complete IV Unasyn today, for PNA. On 12 L HFNC yesterday and CPAP 4 L overnight. Patient on 9L O2 today. Will ambulate with pulse ox today. ?-Monitor respiratory status ?-O2 sats 88-92% ?-Ambulate with pulse ox  ?-Unasyn (5/7-5/12) Day 6/6 ? ?COPD (chronic obstructive pulmonary disease) (HCC) ?-Incruse ellipta 1 puff daily ?-Dulera 100-5 2 puff daily ? ?Anasarca-resolved ?2/2 right heart failure ?-Lasix 40 mg daily ? ?Hx of polysubstance abuse- suboxone ?-Hold suboxone ?-Nicotine patch 21 mg ? ?Acute encephalopathy-appears resolved ?Thought 2/2 to CO2 retention/hypoxia.  ? ?HTN ?BP range  100-110's/70-80's ?Holding amlodipine-benazepril ? ? ? ?FEN/GI: Full liquids ?PPx: Lovenox ?Dispo:Home with home health  in 2-3 days.  ? ?Subjective:  ?Patient feeling well and notes he's been able to walk around the room. ? ?Objective: ?Temp:  [97.9 ?F (36.6 ?C)-99.5 ?F (37.5 ?C)] 97.9 ?F (36.6 ?C) (05/12 5597) ?Pulse Rate:  [91-103] 102 (05/11 2358) ?Resp:  [11-21] 18 (05/11 2358) ?BP: (105-131)/(73-101) 118/81 (05/11 2358) ?SpO2:  [88 %-97 %] 95 % (05/11 2358) ?FiO2 (%):  [50 %] 50 % (05/11 0900) ?Physical Exam: ?General: Well appearing, NAD, obese, white male ?Cardiovascular: RRR, NRMG ?Respiratory: CTABL ?Abdomen: Soft, nttp, non-distended, no fluid wave ?Extremities: No edema, cap refill < 2 sec ? ?Laboratory: ?Recent Labs  ?Lab 02/08/22 ?0420 02/09/22 ?4163 02/11/22 ?0327  ?WBC 7.5 5.5 5.6  ?HGB 15.5 14.6 15.0  ?HCT 56.5* 52.8* 52.3*  ?PLT 147* 261 203  ? ?Recent Labs  ?Lab 02/04/22 ?1541 02/04/22 ?2140 02/05/22 ?0140 02/09/22 ?8453 02/10/22 ?6468 02/11/22 ?0327  ?NA 129* 132*   < > 136 135 134*  ?K 4.8 3.8   < > 5.2* 4.2 4.2  ?CL 86* 86*   < > 85* 88* 90*  ?CO2 34* 33*   < > 44* 38* 34*  ?BUN 8 8   < > 18 11 15   ?CREATININE 0.73 0.80   < > 0.60* 0.56* 0.48*  ?CALCIUM 8.4* 8.8*   < > 8.9 9.4 8.9  ?PROT 5.8* 6.7  --   --   --   --   ?BILITOT 1.1 0.7  --   --   --   --   ?ALKPHOS 74 81  --   --   --   --   ?ALT 6 7  --   --   --   --   ?  AST 31 15  --   --   --   --   ?GLUCOSE 100* 208*   < > 116* 84 106*  ? < > = values in this interval not displayed.  ? ? ? ? ?Imaging/Diagnostic Tests: ? ? ?Bess Kinds, MD ?02/11/2022, 6:18 AM ?PGY-1, [facility] ?FPTS Intern pager: [phone], text pages welcome ? ?

## 2022-02-11 NOTE — TOC Progression Note (Cosign Needed)
Transition of Care (TOC) - Progression Note  ? ? ?Patient Details  ?Name: Trevor Thomas ?MRN: [id number] ?Date of Birth: 10/20/1965 ? ?Transition of Care (TOC) CM/SW Contact  ?Tom-Johnson, Renea Ee, RN ?Phone Number: ?02/11/2022, 1:52 PM ? ?Clinical Narrative:    ? ?Mr. Fodor presents with acute on chronic respiratory failure with hypoxia and hypercapnia due to COPD.  The use of the NIV will treat patient's high PC02 levels (93.5 with an elevated Bicarb of 53.7).  Therefore, NIV use can reduce risk of exacerbations and future hospitalizations when used at night and during the day.  All alternate devices [phone] and [id number]) have been proven ineffective to provide essential volume control necessary to maintain acceptable CO2 levels.  An NIV with volume-targeted pressure support is necessary to prevent patient from life-threatening harm.  Interruption or failure to provide NIV would quickly lead to exacerbation of the patient's condition, hospital re-admission, and likely harm to the patient. Continued use is preferred.  Patient is able to protect their airways and clear secretions on their own. ? ? ?  ?Barriers to Discharge: Continued Medical Work up ? ?Expected Discharge Plan and Services ?  ?  ?Discharge Planning Services: CM Consult ?  ?Living arrangements for the past 2 months: Mobile Home ?                ?  ?  ?  ?  ?  ?  ?  ?  ?  ?  ? ? ?Social Determinants of Health (SDOH) Interventions ?  ? ?Readmission Risk Interventions ?   ? View : No data to display.  ?  ?  ?  ? ? ?

## 2022-02-11 NOTE — Progress Notes (Signed)
Civil engineer, contracting (ACC) ? ?Referral received for outpatient palliative care services once discharged. ? ?ACC will follow up with patient in his home. ? ?Wallis Bamberg DNP, RN ?[facility] Liaison ?

## 2022-02-11 NOTE — Progress Notes (Signed)
On assessment, pt's VSS, breath sounds clear, pulses +2 in all extremeties, AOx4. Eager to get OOB to chair and eat breakfast. Pt obtained bed on 6E22, report called, all questions answered.  ?

## 2022-02-11 NOTE — Evaluation (Signed)
Occupational Therapy Evaluation ?Patient Details ?Name: Trevor Thomas ?MRN: [id number] ?DOB: 20-Mar-1966 ?Today's Date: 02/11/2022 ? ? ?History of Present Illness Pt adm 5/5 with SOB and LE edema. Pt with acute hypoxic respiratory failure and rt sided heart failure. On 5/7 pt with acute encephalopathy and respiratory distress. Pt placed on bipap and given Narcan. Pt intubated 5/8 for worsening hypercarbia. Extubated 5/10. PMH - polysubstance abuse, chronic pain, depression, anxiety, OA, COPD.  ? ?Clinical Impression ?  ?Pt admitted for concerns listed above. PTA pt reports that he was independent with all ADL's and IADL's, using no AD. At this time, pt presents with continued independence with all ADL's and functional mobility, limited only by O2 needs. Pt does not appear to have a decrease in activity tolerance. He has no further OT needs and acute OT will sign off.  ?   ? ?Recommendations for follow up therapy are one component of a multi-disciplinary discharge planning process, led by the attending physician.  Recommendations may be updated based on patient status, additional functional criteria and insurance authorization.  ? ?Follow Up Recommendations ? No OT follow up  ?  ?Assistance Recommended at Discharge None  ?Patient can return home with the following Assistance with cooking/housework ? ?  ?Functional Status Assessment ? Patient has had a recent decline in their functional status and demonstrates the ability to make significant improvements in function in a reasonable and predictable amount of time.  ?Equipment Recommendations ? None recommended by OT  ?  ?Recommendations for Other Services   ? ? ?  ?Precautions / Restrictions Precautions ?Precautions: Fall ?Restrictions ?Weight Bearing Restrictions: No  ? ?  ? ?Mobility Bed Mobility ?  ?  ?  ?  ?  ?  ?  ?General bed mobility comments: seated in recliner ?  ? ?Transfers ?Overall transfer level: Modified independent ?  ?  ?  ?  ?  ?  ?  ?  ?General transfer  comment: No assist needed, safe with ambulation to bathroom ?  ? ?  ?Balance Overall balance assessment: Mild deficits observed, not formally tested ?  ?  ?  ?  ?  ?  ?  ?  ?  ?  ?  ?  ?  ?  ?  ?  ?  ?  ?   ? ?ADL either performed or assessed with clinical judgement  ? ?ADL Overall ADL's : At baseline;Modified independent ?  ?  ?  ?  ?  ?  ?  ?  ?  ?  ?  ?  ?  ?  ?  ?  ?  ?  ?  ?General ADL Comments: Pt able to complete all BADL's and functional mobility with no assist.  ? ? ? ?Vision Baseline Vision/History: 0 No visual deficits ?Ability to See in Adequate Light: 0 Adequate ?Patient Visual Report: No change from baseline ?Vision Assessment?: No apparent visual deficits  ?   ?Perception   ?  ?Praxis   ?  ? ?Pertinent Vitals/Pain Pain Assessment ?Pain Assessment: No/denies pain  ? ? ? ?Hand Dominance Right ?  ?Extremity/Trunk Assessment Upper Extremity Assessment ?Upper Extremity Assessment: Overall WFL for tasks assessed ?  ?Lower Extremity Assessment ?Lower Extremity Assessment: Defer to PT evaluation ?  ?Cervical / Trunk Assessment ?Cervical / Trunk Assessment: Normal ?  ?Communication Communication ?Communication: No difficulties ?  ?Cognition Arousal/Alertness: Awake/alert ?Behavior During Therapy: [facility] for tasks assessed/performed, Flat affect ?Overall Cognitive Status: Within Functional Limits for tasks assessed ?  ?  ?  ?  ?  ?  ?  ?  ?  ?  ?  ?  ?  ?  ?  ?  ?  General Comments: decreased awareness of disease process, however receptive to information ?  ?  ?General Comments  VSS on 6L ? ?  ?Exercises   ?  ?Shoulder Instructions    ? ? ?Home Living Family/patient expects to be discharged to:: Private residence ?Living Arrangements: Non-relatives/Friends;Spouse/significant other ?Available Help at Discharge: Available PRN/intermittently;Friend(s) ?Type of Home: Mobile home ?Home Access: Stairs to enter ?Entrance Stairs-Number of Steps: 3 ?Entrance Stairs-Rails: Right;Left ?Home Layout: One level ?  ?  ?Bathroom  Shower/Tub: Tub/shower unit ?  ?Bathroom Toilet: Standard ?  ?  ?Home Equipment: Agricultural consultant (2 wheels) ?  ?  ?  ? ?  ?Prior Functioning/Environment Prior Level of Function : Independent/Modified Independent ?  ?  ?  ?  ?  ?  ?Mobility Comments: No assistive device ?  ?  ? ?  ?  ?OT Problem List: Decreased activity tolerance;Impaired balance (sitting and/or standing);Cardiopulmonary status limiting activity ?  ?   ?OT Treatment/Interventions:    ?  ?OT Goals(Current goals can be found in the care plan section) Acute Rehab OT Goals ?Patient Stated Goal: To go home ?OT Goal Formulation: All assessment and education complete, DC therapy ?Time For Goal Achievement: 02/25/22 ?Potential to Achieve Goals: Good  ?OT Frequency:   ?  ? ?Co-evaluation   ?  ?  ?  ?  ? ?  ?AM-PAC OT "6 Clicks" Daily Activity     ?Outcome Measure Help from another person eating meals?: None ?Help from another person taking care of personal grooming?: None ?Help from another person toileting, which includes using toliet, bedpan, or urinal?: None ?Help from another person bathing (including washing, rinsing, drying)?: None ?Help from another person to put on and taking off regular upper body clothing?: None ?Help from another person to put on and taking off regular lower body clothing?: None ?6 Click Score: 24 ?  ?End of Session Equipment Utilized During Treatment: Oxygen ?Nurse Communication: Mobility status ? ?Activity Tolerance: Patient tolerated treatment well ?Patient left: in chair;with call bell/phone within reach ? ?OT Visit Diagnosis: Unsteadiness on feet (R26.81);Other abnormalities of gait and mobility (R26.89);Muscle weakness (generalized) (M62.81)  ?              ?Time: 6712-4580 ?OT Time Calculation (min): 15 min ?Charges:  OT General Charges ?$OT Visit: 1 Visit ?OT Evaluation ?$OT Eval Moderate Complexity: 1 Mod ? ?Ege Muckey H., OTR/L ?Acute Rehabilitation ? ?Emmie Frakes Elane Bing Plume ?02/11/2022, 5:34 PM ?

## 2022-02-11 NOTE — Discharge Instructions (Addendum)
Dear Trevor Thomas, ? ?Thank you for letting us participate in your care. You were hospitalized for breathing troubles and diagnosed with Acute right-sided congestive heart failure (HCC). You were treated with antibiotics for a pneumonia.  You did require brief stay in ICU and required intubation to help with your breathing.  We are so thankful that you are doing better now.  We have started you on a fluid pill that you will need to continue to take daily.  ? ?POST-HOSPITAL & CARE INSTRUCTIONS ?Your blood pressure was well controlled without medications.  We will recommend that you do not restart your blood pressure medications until you are followed up by your primary care physician.  That they can restart if appropriate. ?Start Lasix 40mg  daily. This will help to keep the extra fluid off. ?We are starting you on inhalers to use daily to help control your COPD.  You can continue to use albuterol as needed for any wheezing or shortness of breath. ?Follow up with cardiology for heart cath.  ?Work with your primary care provider to stop smoking.  We have prescribed nicotine patches for you to continue to use in the interim. ?We are sending you home with home oxygen to use when walking around. You should use 1 liter. Try to maintain your oxygen saturations between 88-92%.  ? ? ?DOCTOR'S APPOINTMENT   ?Future Appointments  ?Date Time Provider Department Center  ?02/23/2022 10:05 AM 02/25/2022, PA-C [facility]  ? ? Follow-up Information   ? ? Cannon Kettle K, PA-C Follow up on 02/23/2022.   ?Specialty: Internal Medicine ?Why: At 10:05AM for your post hospital cardiology follow up appointment ?Contact information: ?[address] ?[address] ?[place] ?[phone] ? ? ?  ?  ? ? [facility]. Schedule an appointment as soon as possible for a visit in 1 week(s).   ?Why: Call your primary care clinic for a hospital follow up appointment. Should be within about a week of  discharge. ?Contact information: ?[address] ?[place] ?[phone] ? ? ?  ?  ? ? [phone], MD .   ?Specialty: Cardiology ?Contact information: ?[address] ?[address] ?[place] ?[phone] ? ? ?  ?  ? ?  ?  ? ?  ? ? ?Take care and be well! ? ?Family Medicine Teaching Service Inpatient Team ?[facility]  ?[facility]  ?[address] [place], [place] ?([phone] ?

## 2022-02-11 NOTE — Consult Note (Signed)
?Palliative Medicine Inpatient Consult Note ? ?Consulting Provider: Kipp Brood, MD ? ?Reason for consult:   ?[facility] Consult  ?Reason for Consult? advanced COPD but not compliant. Patient wishes for [place]  ? ?HPI:  ?Per intake H&P --> 56 year old male with pertinent PMH of COPD, tobacco abuse, HFpEF, polysubstance abuse, chronic pain syndrome on Suboxone presents to [facility] ED on 5/5 with SOB. ?  ?On 5/5 patient presented to [facility] with progressive SOB and increased leg swelling.  Patient's vital stable.  Afebrile.  Sats 99% on [place].  Does not wear O2 at home.  WBC WNL.  BNP 207.  VBG 7.39, 72, 46, 44 (likely chronic hypercarbia).  Patient admitted to floors with family medicine for likely CHF exacerbation.  Patient refused BiPAP due to anxiety.  Was stable on [employer].  Started on IV Lasix.  Started on home Suboxone. ?  ?On 5/6, Echo performed showing EF greater than 14%; grade 1 diastolic dysfunction; cor pulmonale.  Cards consulted.  On 5/7 cards increase Lasix from 40 twice daily to 80 twice daily due to low UOP.  Later in the evening, patient very obtunded and hypoxic.  GCS initially less than 8.  Placed on 15 L [place] HFNC.  Rapid response called.  Patient given Narcan with improvement in mental status.  ABG : pH 7.2, PCO2 123, PaO2 61.  Patient placed on BiPAP.  Given additional 40 Lasix.  CXR showing increased infiltrates in RLL possible aspiration.  Started on Unasyn.  UDS positive for THC. ? ?Palliative care was asked to get involved to further discuss patients chronic disease burden.  ? ? ?Clinical Assessment/Goals of Care: ? ?*Please note that this is a verbal dictation therefore any spelling or grammatical errors are due to the "[place] One" system interpretation. ? ?I have reviewed medical records including EPIC notes, labs and imaging, received report from bedside RN, assessed the patient.  ?  ?I met with Trevor Thomas to further discuss diagnosis prognosis,  GOC, EOL wishes, disposition and options. ?  ?I introduced Palliative Medicine as specialized medical care for people living with serious illness. It focuses on providing relief from the symptoms and stress of a serious illness. The goal is to improve quality of life for both the patient and the family. ? ?Medical History Review and Understanding: ? ?Trevor Thomas and I reviewed his past medical history. We discussed his heart failure - he shares that he had no idea he had heart failure. He reviews that he had swelling in his legs and abdomen but did not realize this was related to his heart failure. Education was provided on what heart failure is and how it progresses in phases.  ? ?We reviewed that Trevor Thomas has had COPD for "many years" and how at home he feels he is functionating well though still smoking cigarettes and marijuana which he realizes are not helping his already present lung disease.  ? ?Discussed Khiry's chronic pain in the setting of his prior profession as a Games developer. He shares that he use to take percocet regularly and got scare in regard to the side effect profile. He has been seeing a pain specialist at [facility] for [employer]. He states that this has helped him.  ? ?Trevor Thomas also shares that he has a standing history of OSA and has been wishing to get a CPAP.  ? ?Social History: ? ?Trevor Thomas is from [place], [name]. He has been married though is separated from his wife. He has  three sons who live at the [place]. He shares that he has a sister with whom he is close who still lives in the [place] area. 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 He is a man of faith and practices within the [employer] [facility] [employer] denomination. ? ?Trevor Thomas shares with me that he was homeless about 12 years ago prior to meeting his long time girlfriend, Trevor Thomas. He expresses that he lives in a trailer in the [place] of [place] home. They are not intimate though are "best  friends".  ? ?Functional and Nutritional State: ? ?Trevor Thomas was able to do all bADL's and iADL's independently. ? ?Trevor Thomas shares having a robust appetite prior to admission.  ? ?Advance Directives: ? ?A detailed discussion was had today regarding advanced directives.  Patient has never completed this. I urged him to consider this in the setting of him still being legally married. He expresses that he plans on doing this.  ? ?Code Status: ? ?Concepts specific to code status, artifical feeding and hydration, continued IV antibiotics and rehospitalization was had.  I asked Trevor Thomas is he would event wish to be re-intubated? He shares that he would like to take some time to think about it. He expressed that it was a very unpleasant experience. ? ?I spoke to Trevor Thomas about whether or not he would want resuscitation if he were to suffer a cardiac arrest? He al requests some time to consider this. ? ?Provided "Hard Choices for Loving People" booklet.  ? ?Discussion: ? ?Trevor Thomas and I reviewed his hospitalization. He shares anger that he was given narcan and expresses frustration with having had to be intubated. He reviews that he was doing, "just fine".  ? ?I shared that per review of the chart he did seem to experience some distress with his breathing and that his mental state had declined. These are the events which preceded intubation. He shares that he doesn't agree with this. He reviews how awful being intubated was and the care he received while he was intubated. He shares that no one attempted to communicate with him. I spent time listening to his concerns.  ? ?Reviewed patient chronic disease processes. I shared that based upon this experience it would be prudent to consider what his wishes long term would be in terms of how aggressively he would like to be treated. Discussed that with his COPD he is likely to possible get interventions which he may never be able to get off of such as mechanical ventilation.  ? ?Trevor Thomas shares  that he has some things to consider. He is willing for the PMT to stop by during his hospital stay for additional conversations.  ? ?Discussed the importance of continued conversation with family and their  medical providers regarding overall plan of care and treatment options, ensuring decisions are within the context of the patients values and GOCs. ? ?Decision Maker: ?Trevor Thomas (significant other): [phone] ? ?SUMMARY OF RECOMMENDATIONS   ?FULL CODE --> Patient would like to take some time to consider this. He does not think he would want to be re-intubated ? ?TOC for OP Palliative care ? ?Appreciate Chaplain helping with advance directives and also offering spiritual support. Patient is [place].  ? ?Ongoing Palliative care support ? ?Code Status/Advance Care Planning: ?FULL CODE ? ?Palliative Prophylaxis:  ?Aspiration, Bowel Regimen, Delirium Protocol, Frequent Pain Assessment, Oral Care, Palliative Wound Care, and Turn Reposition ? ?Additional Recommendations (Limitations,  Scope, Preferences): ?Continue current care ? ?Psycho-social/Spiritual:  ?Desire for further Chaplaincy support: Yes ?Additional Recommendations: Education on chronic nature of COPD and CHF ?  ?Prognosis: Recurrent readmissions, multiple chronic co-morbidities. High 12 month mortality risk. ? ?Discharge Planning: Discharge to home when medically optimized. ? ?Vitals:  ? 02/11/22 0600 02/11/22 0747  ?BP: 107/76   ?Pulse: 85   ?Resp: 10   ?Temp:  97.8 ?F (36.6 ?C)  ?SpO2: 93%   ? ? ?Intake/Output Summary (Last 24 hours) at 02/11/2022 1021 ?Last data filed at 02/11/2022 0030 ?Gross per 24 hour  ?Intake 1924.99 ml  ?Output 625 ml  ?Net 1299.99 ml  ? ?Last Weight  Most recent update: 02/10/2022  3:48 AM  ? ? Weight  ?115.6 kg (254 lb 13.6 oz)  ?      ? ?  ? ?Gen:  Middle aged caucasian M in NAD ?HEENT: moist mucous membranes ?CV: Regular rate and rhythm ?PULM: On 10LPM HFNC, breathing even and nonlabored ?ABD: soft/nontender/nondistended/normal  bowel sounds ?EXT: BLE edema ?Neuro: Alert and oriented x3 ? ?PPS: 60% ? ? ?This conversation/these recommendations were discussed with patient primary care team, Dr. Lynetta Mare ? ?Billing based on MDM: High ? ?

## 2022-02-11 NOTE — Progress Notes (Signed)
SATURATION QUALIFICATIONS: (This note is used to comply with regulatory documentation for home oxygen) ? ?Patient Saturations on Room Air at Rest = n/a% ? ?Patient Saturations on Room Air while Ambulating = n/a% ? ?Patient Saturations on 6 Liters of oxygen while Ambulating = 90% ? ?Please briefly explain why patient needs home oxygen: ? ?Pt requires 6L O2 via HFNC to be able to maintain SpO2 >90% with ambulation. ? ?Beverley Sherrard B. Beverely Risen PT, DPT ?Acute Rehabilitation Services ?Please use secure chat or  ?Call Office [phone] ? ?

## 2022-02-12 DIAGNOSIS — Z7189 Other specified counseling: Secondary | ICD-10-CM | POA: Diagnosis not present

## 2022-02-12 DIAGNOSIS — Z720 Tobacco use: Secondary | ICD-10-CM

## 2022-02-12 DIAGNOSIS — Z515 Encounter for palliative care: Secondary | ICD-10-CM | POA: Diagnosis not present

## 2022-02-12 LAB — BASIC METABOLIC PANEL
Anion gap: 7 (ref 5–15)
BUN: 10 mg/dL (ref 6–20)
CO2: 33 mmol/L — ABNORMAL HIGH (ref 22–32)
Calcium: 9.5 mg/dL (ref 8.9–10.3)
Chloride: 95 mmol/L — ABNORMAL LOW (ref 98–111)
Creatinine, Ser: 0.53 mg/dL — ABNORMAL LOW (ref 0.61–1.24)
GFR, Estimated: >60 mL/min (ref >60)
Glucose, Bld: 103 mg/dL — ABNORMAL HIGH (ref 70–99)
Potassium: 4.1 mmol/L (ref 3.5–5.1)
Sodium: 135 mmol/L (ref 135–145)

## 2022-02-12 MED ORDER — FUROSEMIDE 40 MG PO TABS: 40.0000 mg | ORAL_TABLET | Freq: Every day | ORAL | 0 refills | Status: AC

## 2022-02-12 MED ORDER — NICOTINE 21 MG/24HR TD PT24
21.0000 mg | MEDICATED_PATCH | Freq: Every day | TRANSDERMAL | 0 refills | Status: AC
Start: 2022-02-13 — End: ?

## 2022-02-12 MED ORDER — UMECLIDINIUM BROMIDE 62.5 MCG/ACT IN AEPB: 1.0000 | INHALATION_SPRAY | Freq: Every day | RESPIRATORY_TRACT | 0 refills | Status: AC

## 2022-02-12 MED ORDER — MOMETASONE FURO-FORMOTEROL FUM 100-5 MCG/ACT IN AERO: 2.0000 | INHALATION_SPRAY | Freq: Two times a day (BID) | RESPIRATORY_TRACT | 0 refills | Status: AC

## 2022-02-12 NOTE — Progress Notes (Signed)
Home O2 delivered to patient room. Discharge instructions, Rx's and follow up appts explained and provided to patient verbalized understanding. Patient left floor via wheel chair accompanied by staff. No c/o pain or shortness of breath. ? ?Nelly Scriven, Kae Heller, RN ? ?

## 2022-02-12 NOTE — Progress Notes (Addendum)
Family Medicine Teaching Service ?Daily Progress Note ?Intern Pager: [phone] ? ?Patient name: Trevor Thomas Medical record number: [id number] ?Date of birth: [birth year] Age: 56 y.o. Gender: male ? ?Primary Care Provider: [facility] ?Consultants: CCM (s/o), Cardiology   ?Code Status: FULL ? ?Pt Overview and Major Events to Date:  ?5/5 Admitted to [facility] for SOB ?5/7 PCCM consulted for acute encephalopathy & respiratory distress.  Pt placed on BiPAP & given Narcan, transferred to ICU. ?5/8 ABG compensated, PCO2 118, decompensated afternoon requiring bipap. CTA Chest neg for PE.  ?5/8 patient intubated for worsening hypercarbia ?5/10 extubated ?5/12 transferred out of ICU ? ?Assessment and Plan: ?Trevor Thomas is a 56 y.o. male who was admitted for acute hypoxic and hypercapnic respiratory failure, heart failure exacerbation complicated by encephalopathy requiring short ICU stay and intubation. Has now been stable and on oral diuresis. PMHx significant for hypertension, hyperlipidemia, COPD, polysubstance abuse (opioids, marijuana, tobacco), chronic pain with osteoarthritis, TBI, anxiety, hx of alcohol abuse. ? ?Acute HFpEF exacerbation  Cor Pulmonale  ?On oral diuresis. Weight 115.6 kg on 5/11, appears to be dry weight. No I/O documented yesterday. Electrolytes stable.  ?-Plan for d/c with Lasix 40 mg daily ?-RHC outpatient  ? ?Pneumonia: Improved, stable  ?Ambulated with pulse ox, required 6L HFNC to maintain SpO2 >90%. Completed 6 days of unasyn. Now down to 1L [place]. ?-Reambulate with Pulse ox ?-May need to be d/c with DME oxygen in the short-term ? ?COPD: chronic, stable  ?-Continue home meds: incruse ellipta, dulera. ?-Albuterol PRN.  ?-Encourage smoking cessation. ? ?Hx of Polysubstance Abuse  ?Suboxone restarted yesterday. Continue 2 tablets BID.  ? ?HTN: chronic, stable ?Normotensive off of anti-hypertensives. ?-Plan for d/c off anti-hypertensives with close PCP f/u  ? ??Prediabetes ?Unclear if previous  hx of diabetes but A1c on admission 5.8. Has not needed any SSI.  ?-D/c SSI  ?-D/c CBG ? ?FEN/GI: Regular ?PPx: Lovenox ?Dispo:Home with home health   possibly today . Barriers include YQ:[id number] with pulse ox, DME oxygen if needed.  ? ?Subjective:  ?He feels much improved today. Denies any SOB. Excited about possible d/c home today. Reports he will try to stop smoking. Only complaint is headache and requests Tylenol or suboxone for this.  ? ?Objective: ?Temp:  [97.8 ?F (36.6 ?C)-98.9 ?F (37.2 ?C)] 98.9 ?F (37.2 ?C) (05/13 5638) ?Pulse Rate:  [87-97] 97 (05/13 0521) ?Resp:  [16-20] 17 (05/13 0521) ?BP: (113-125)/(67-90) 113/67 (05/13 0521) ?SpO2:  [92 %-96 %] 92 % (05/13 0521) ?FiO2 (%):  [95 %] 95 % (05/12 2044) ?Physical Exam: ?General: NAD, pleasant, sitting up in chair ?Cardiovascular: RRR without murmur ?Respiratory: on 1L [place]. Diminished lung sounds b/l bases, no wheezing appreciated ?Abdomen: Mildly distended but soft, non-tender ?Extremities: Chronic skin changes to b/l lower extremities, trace pitting edema b/l ? ?Laboratory: ?Recent Labs  ?Lab 02/08/22 ?0420 02/09/22 ?7564 02/11/22 ?0327  ?WBC 7.5 5.5 5.6  ?HGB 15.5 14.6 15.0  ?HCT 56.5* 52.8* 52.3*  ?PLT 147* 261 203  ? ?Recent Labs  ?Lab 02/10/22 ?0856 02/11/22 ?0327 02/12/22 ?0408  ?NA 135 134* 135  ?K 4.2 4.2 4.1  ?CL 88* 90* 95*  ?CO2 38* 34* 33*  ?BUN 11 15 10   ?CREATININE 0.56* 0.48* 0.53*  ?CALCIUM 9.4 8.9 9.5  ?GLUCOSE 84 106* 103*  ? ?Imaging/Diagnostic Tests: ?No results found. ? ? ? , DO ?02/12/2022, 6:56 AM ?PGY-2, [facility] ?FPTS Intern pager: [phone], text pages welcome ? ?

## 2022-02-12 NOTE — Progress Notes (Signed)
SATURATION QUALIFICATIONS: (This note is used to comply with regulatory documentation for home oxygen) ? ?Patient Saturations on Room Air at Rest = 90% ? ?Patient Saturations on Room Air while Ambulating = 87% ? ?Patient Saturations on 1 Liters of oxygen while Ambulating = 91% ? ?Please briefly explain why patient needs home oxygen:O2 sat dropping while patient ambulating  ? ?Yancy Hascall, Kae Heller, RN ? ?

## 2022-02-12 NOTE — TOC Transition Note (Signed)
Transition of Care (TOC) - CM/SW Discharge Note ? ? ?Patient Details  ?Name: Trevor Thomas ?MRN: [id number] ?Date of Birth: 1965-12-04 ? ?Transition of Care (TOC) CM/SW Contact:  ?Shealeigh Dunstan G., RN ?Phone Number: ?02/12/2022, 12:11 PM ? ?Clinical Narrative:   56 y.o. male presenting with shortness of breath, abdominal swelling. PMH is significant for HTN, HLD, COPD, polysubstance use (opioids, marijuana, tobacco), chronic pain with osteoarthritis, TBI, anxiety, Hx GI bleed, hx of etoh abuse,  ?   ?RNCM received order for Oxygen,  Jasmine at [employer] contacted with order and confirmation received.  Oxygen to be delivered to patient's room prior to discharge. ? ?  ?Barriers to Discharge: Continued Medical Work up ? ?Patient Goals and CMS Choice ?Patient states their goals for this hospitalization and ongoing recovery are:: To get better and return home. ?CMS Medicare.gov Compare Post Acute Care list provided to:: Patient ?Choice offered to / list presented to : Patient, Sibling (Sister, Lamar Laundry and Katheren Puller.) ? ?Discharge Placement ?  ?           ?  ?  ?  ?  ?Discharge Plan and Services ?  ?Discharge Planning Services: CM Consult ?           ?DME Arranged: Oxygen ?DME Agency: [facility] ?Date DME Agency Contacted: 02/12/22 ?Time DME Agency Contacted: 1211 ?Representative spoke with at DME Agency: Leavy Cella ?HH Arranged: NA ?HH Agency: NA ?  ?  ?  ? ?Social Determinants of Health (SDOH) Interventions ?  ? ?Readmission Risk Interventions ?   ? View : No data to display.  ?  ?  ?  ? ? ?

## 2022-02-12 NOTE — Plan of Care (Signed)
?Problem: Education: ?Goal: Knowledge of General Education information will improve ?Description: Including pain rating scale, medication(s)/side effects and non-pharmacologic comfort measures ?02/12/2022 1208 by Genevie Ann, RN ?Outcome: Adequate for Discharge ?02/12/2022 1208 by Genevie Ann, RN ?Outcome: Adequate for Discharge ?  ?Problem: Health Behavior/Discharge Planning: ?Goal: Ability to manage health-related needs will improve ?02/12/2022 1208 by Genevie Ann, RN ?Outcome: Adequate for Discharge ?02/12/2022 1208 by Genevie Ann, RN ?Outcome: Adequate for Discharge ?  ?Problem: Clinical Measurements: ?Goal: Ability to maintain clinical measurements within normal limits will improve ?02/12/2022 1208 by Genevie Ann, RN ?Outcome: Adequate for Discharge ?02/12/2022 1208 by Genevie Ann, RN ?Outcome: Adequate for Discharge ?Goal: Will remain free from infection ?02/12/2022 1208 by Genevie Ann, RN ?Outcome: Adequate for Discharge ?02/12/2022 1208 by Genevie Ann, RN ?Outcome: Adequate for Discharge ?Goal: Diagnostic test results will improve ?02/12/2022 1208 by Genevie Ann, RN ?Outcome: Adequate for Discharge ?02/12/2022 1208 by Genevie Ann, RN ?Outcome: Adequate for Discharge ?Goal: Respiratory complications will improve ?02/12/2022 1208 by Genevie Ann, RN ?Outcome: Adequate for Discharge ?02/12/2022 1208 by Genevie Ann, RN ?Outcome: Adequate for Discharge ?Goal: Cardiovascular complication will be avoided ?02/12/2022 1208 by Genevie Ann, RN ?Outcome: Adequate for Discharge ?02/12/2022 1208 by Genevie Ann, RN ?Outcome: Adequate for Discharge ?  ?Problem: Activity: ?Goal: Risk for activity intolerance will decrease ?02/12/2022 1208 by Genevie Ann, RN ?Outcome: Adequate for Discharge ?02/12/2022 1208 by Genevie Ann, RN ?Outcome: Adequate for Discharge ?  ?Problem: Nutrition: ?Goal: Adequate nutrition will be maintained ?02/12/2022 1208 by  Genevie Ann, RN ?Outcome: Adequate for Discharge ?02/12/2022 1208 by Genevie Ann, RN ?Outcome: Adequate for Discharge ?  ?Problem: Coping: ?Goal: Level of anxiety will decrease ?02/12/2022 1208 by Genevie Ann, RN ?Outcome: Adequate for Discharge ?02/12/2022 1208 by Genevie Ann, RN ?Outcome: Adequate for Discharge ?  ?Problem: Elimination: ?Goal: Will not experience complications related to bowel motility ?02/12/2022 1208 by Genevie Ann, RN ?Outcome: Adequate for Discharge ?02/12/2022 1208 by Genevie Ann, RN ?Outcome: Adequate for Discharge ?Goal: Will not experience complications related to urinary retention ?02/12/2022 1208 by Genevie Ann, RN ?Outcome: Adequate for Discharge ?02/12/2022 1208 by Genevie Ann, RN ?Outcome: Adequate for Discharge ?  ?Problem: Pain Managment: ?Goal: General experience of comfort will improve ?02/12/2022 1208 by Genevie Ann, RN ?Outcome: Adequate for Discharge ?02/12/2022 1208 by Genevie Ann, RN ?Outcome: Adequate for Discharge ?  ?Problem: Safety: ?Goal: Ability to remain free from injury will improve ?02/12/2022 1208 by Genevie Ann, RN ?Outcome: Adequate for Discharge ?02/12/2022 1208 by Genevie Ann, RN ?Outcome: Adequate for Discharge ?  ?Problem: Skin Integrity: ?Goal: Risk for impaired skin integrity will decrease ?02/12/2022 1208 by Genevie Ann, RN ?Outcome: Adequate for Discharge ?02/12/2022 1208 by Genevie Ann, RN ?Outcome: Adequate for Discharge ?  ?Problem: Education: ?Goal: Ability to demonstrate management of disease process will improve ?02/12/2022 1208 by Genevie Ann, RN ?Outcome: Adequate for Discharge ?02/12/2022 1208 by Genevie Ann, RN ?Outcome: Adequate for Discharge ?Goal: Ability to verbalize understanding of medication therapies will improve ?02/12/2022 1208 by Genevie Ann, RN ?Outcome: Adequate for Discharge ?02/12/2022 1208 by Genevie Ann, RN ?Outcome: Adequate for  Discharge ?Goal: Individualized Educational Video(s) ?02/12/2022 1208 by Genevie Ann, RN ?Outcome: Adequate for Discharge ?02/12/2022 1208 by Genevie Ann, RN ?Outcome: Adequate for Discharge ?  ?Problem: Activity: ?Goal: Capacity to carry out  activities will improve ?02/12/2022 1208 by Genevie Ann, RN ?Outcome: Adequate for Discharge ?02/12/2022 1208 by Genevie Ann, RN ?Outcome: Adequate for Discharge ?  ?Problem: Cardiac: ?Goal: Ability to achieve and maintain adequate cardiopulmonary perfusion will improve ?02/12/2022 1208 by Genevie Ann, RN ?Outcome: Adequate for Discharge ?02/12/2022 1208 by Genevie Ann, RN ?Outcome: Adequate for Discharge ?  ?Problem: Education: ?Goal: Knowledge of disease or condition will improve ?02/12/2022 1208 by Genevie Ann, RN ?Outcome: Adequate for Discharge ?02/12/2022 1208 by Genevie Ann, RN ?Outcome: Adequate for Discharge ?Goal: Knowledge of the prescribed therapeutic regimen will improve ?02/12/2022 1208 by Genevie Ann, RN ?Outcome: Adequate for Discharge ?02/12/2022 1208 by Genevie Ann, RN ?Outcome: Adequate for Discharge ?Goal: Individualized Educational Video(s) ?02/12/2022 1208 by Genevie Ann, RN ?Outcome: Adequate for Discharge ?02/12/2022 1208 by Genevie Ann, RN ?Outcome: Adequate for Discharge ?  ?Problem: Activity: ?Goal: Ability to tolerate increased activity will improve ?02/12/2022 1208 by Genevie Ann, RN ?Outcome: Adequate for Discharge ?02/12/2022 1208 by Genevie Ann, RN ?Outcome: Adequate for Discharge ?Goal: Will verbalize the importance of balancing activity with adequate rest periods ?02/12/2022 1208 by Genevie Ann, RN ?Outcome: Adequate for Discharge ?02/12/2022 1208 by Genevie Ann, RN ?Outcome: Adequate for Discharge ?  ?Problem: Respiratory: ?Goal: Ability to maintain a clear airway will improve ?02/12/2022 1208 by Genevie Ann, RN ?Outcome: Adequate for Discharge ?02/12/2022 1208  by Genevie Ann, RN ?Outcome: Adequate for Discharge ?Goal: Levels of oxygenation will improve ?02/12/2022 1208 by Genevie Ann, RN ?Outcome: Adequate for Discharge ?02/12/2022 1208 by Genevie Ann, RN ?Outcome: Adequate for Discharge ?Goal: Ability to maintain adequate ventilation will improve ?02/12/2022 1208 by Genevie Ann, RN ?Outcome: Adequate for Discharge ?02/12/2022 1208 by Genevie Ann, RN ?Outcome: Adequate for Discharge ?  ?Problem: Inadequate Intake (NI-2.1) ?Goal: Food and/or nutrient delivery ?Description: Individualized approach for food/nutrient provision. ?Outcome: Adequate for Discharge ?  ?Problem: Safety: ?Goal: Non-violent Restraint(s) ?02/12/2022 1208 by Genevie Ann, RN ?Outcome: Adequate for Discharge ?02/12/2022 1208 by Genevie Ann, RN ?Outcome: Adequate for Discharge ?  ?Problem: Acute Rehab PT Goals(only PT should resolve) ?Goal: Pt Will Go Supine/Side To Sit ?Outcome: Adequate for Discharge ?Goal: Pt Will Go Sit To Supine/Side ?Outcome: Adequate for Discharge ?Goal: Patient Will Transfer Sit To/From Stand ?Outcome: Adequate for Discharge ?Goal: Pt Will Ambulate ?Outcome: Adequate for Discharge ?Goal: Pt Will Go Up/Down Stairs ?Outcome: Adequate for Discharge ?  ?Problem: Acute Rehab PT Goals(only PT should resolve) ?Goal: Pt Will Go Supine/Side To Sit ?Outcome: Adequate for Discharge ?Goal: Pt Will Go Sit To Supine/Side ?Outcome: Adequate for Discharge ?Goal: Patient Will Transfer Sit To/From Stand ?Outcome: Adequate for Discharge ?Goal: Pt Will Ambulate ?Outcome: Adequate for Discharge ?Goal: Pt Will Go Up/Down Stairs ?Outcome: Adequate for Discharge ?  ?

## 2022-02-12 NOTE — Progress Notes (Signed)
? ?Palliative Medicine Inpatient Follow Up Note ? ? ?HPI: ?56 year old male with pertinent PMH of COPD, tobacco abuse, HFpEF, polysubstance abuse, chronic pain syndrome on Suboxone presents to [facility] ED on 5/5 with SOB. ?  ?On 5/5 patient presented to [facility] with progressive SOB and increased leg swelling.  Patient's vital stable.  Afebrile.  Sats 99% on [place].  Does not wear O2 at home.  WBC WNL.  BNP 207.  VBG 7.39, 72, 46, 44 (likely chronic hypercarbia).  Patient admitted to floors with family medicine for likely CHF exacerbation.  Patient refused BiPAP due to anxiety.  Was stable on [place].  Started on IV Lasix.  Started on home Suboxone. ?  ?On 5/6, Echo performed showing EF greater than 23%; grade 1 diastolic dysfunction; cor pulmonale.  Cards consulted.  On 5/7 cards increase Lasix from 40 twice daily to 80 twice daily due to low UOP.  Later in the evening, patient very obtunded and hypoxic.  GCS initially less than 8.  Placed on 15 L [place] HFNC.  Rapid response called.  Patient given Narcan with improvement in mental status.  ABG : pH 7.2, PCO2 123, PaO2 61.  Patient placed on BiPAP.  Given additional 40 Lasix.  CXR showing increased infiltrates in RLL possible aspiration.  Started on Unasyn.  UDS positive for THC. ?  ?Palliative care was asked to get involved to further discuss patients chronic disease burden.  ? ?Today's Discussion 02/12/2022 ? ?*Please note that this is a verbal dictation therefore any spelling or grammatical errors are due to the "[place] One" system interpretation. ? ?Chart reviewed inclusive of vital signs, progress notes, laboratory results, and diagnostic images.  ? ?I met with Jaxton at bedside. He was meeting with Dr. Irineo Axon of pulmonology. After their conversation was completed Kile shared with me that he was much improved and so thankful he made it through. He stated that he has so much to live for inclusive of Donna's grandchildren who he is a "papa" to. He laments on the beauty of  having them in his life. ? ?Lavelle shares that he is still very crafty and brings up the repair of a back deck in his girlfriends home.  ? ?Created space and opportunity for patient to explore thoughts feelings and fears regarding current medical situation. Jayion again shares joy that he is feeling so much better. He goes on to discuss cardiopulmonary resuscitation statis which at this point he would desire.  ? ?Regarding advance directives, being that Ibrahim is leaving this afternoon we will be unable to complete these. He plans to bring them home and review them with Butch Penny and his son. We discussed the advance directives being completed by OP Palliative support which he is in agreement with.  ? ?Questions and concerns addressed  ? ?Palliative Support Provided ? ?Objective Assessment: ?Vital Signs ?Vitals:  ? 02/12/22 0930 02/12/22 0932  ?BP:    ?Pulse:    ?Resp:    ?Temp:    ?SpO2: 93% 93%  ? ?No intake or output data in the 24 hours ending 02/12/22 1301 ?Last Weight  Most recent update: 02/10/2022  3:48 AM  ? ? Weight  ?115.6 kg (254 lb 13.6 oz)  ?      ? ?  ? ?Gen:  Middle aged caucasian M in NAD ?HEENT: moist mucous membranes ?CV: Regular rate and rhythm ?PULM: On RA, breathing even and nonlabored ?ABD: soft/nontender/nondistended/normal bowel sounds ?EXT: BLE edema ?Neuro: Alert and oriented x3 ? ?SUMMARY OF  RECOMMENDATIONS   ?FULL CODE/FULL SCOPE OF CARE ?  ?TOC for OP Palliative care --> [facility] arranged ?  ?Patient is discharging therefore will not be able to complete advance directives today --> Information provided. He will review these this Butch Penny and his son. ?  ?Billing based on MDM: High ? ?______________________________________________________________________________________ ?Tacey Ruiz ?[facility] Team ?Team Cell Phone: [phone] ?Please utilize secure chat with additional questions, if there is no response within 30 minutes please call the above phone  number ? ?Palliative Medicine Team providers are available by phone from 7am to 7pm daily and can be reached through the team cell phone.  ?Should this patient require assistance outside of these hours, please call the patient's attending physician. ? ? ? ? ?

## 2022-02-12 NOTE — Discharge Summary (Signed)
Family Medicine Teaching [facility] Discharge Summary  Patient name: Trevor Thomas Medical record number: [id number] Date of birth: 1966/07/04 Age: 56 y.o. Gender: male Date of Admission: 02/04/2022  Date of Discharge: 02/12/22 Admitting Physician: Carney Living, MD  Primary Care Provider: [facility], [facility] Consultants: CCM, Cardiology  Indication for Hospitalization: Acute hypoxic and hypercapnic respiratory failure   Discharge Diagnoses/Problem List:  Principal Problem:   Acute right-sided congestive heart failure (HCC) Active Problems:   Tobacco use   Acute respiratory failure with hypoxia and hypercapnia (HCC)   COPD (chronic obstructive pulmonary disease) (HCC)   Acute heart failure with preserved ejection fraction (HFpEF) (HCC)  Disposition: Home  Discharge Condition: Stable  Discharge Exam:  Blood pressure 130/89, pulse 95, temperature 98.4 F (36.9 C), temperature source Oral, resp. rate 18, height 6\' 1"  (1.854 m), weight 115.6 kg, SpO2 93 %. Physical Exam: General: NAD, pleasant, sitting up in chair Cardiovascular: RRR without murmur Respiratory: on 1L [place]. Diminished lung sounds b/l bases, no wheezing appreciated Abdomen: Mildly distended but soft, non-tender Extremities: Chronic skin changes to b/l lower extremities, trace pitting edema b/l  Brief Hospital Course:  [name] is a 56 year old male admitted for acute hypoxic respiratory failure and cor pulmonale. He required ICU admission 5/08-10 for intubation for acute respiratory decompensation and encephalopathy. PMH includes hypertension, hyperlipidemia, COPD, polysubstance abuse (opioids, marijuana, tobacco), chronic pain with osteoarthritis, TBI, anxiety, hx of alcohol abuse.   Acute on Chronic hypoxic hypercapnic respiratory failure due to Pulmonary hypertension/Cor pulmonale 56 yo man presented to [facility] on 5/5 with complaint of dyspnea, worsening leg swelling. He was initially started on diuresis  and [place] oxygen for treatment with some mild results. ABG at admission showed chronically elevated pCO2 to 60s with a normal pH, which speaks to the chronicity of the problem. With a normal pH and due to patient anxiety, BiPAP not initiated. On 5/8 patient had increased lasix as only moderate diuresis achieved, and that night became obtunded and had acutely worsened pCO2 rentention to 123, pH 7.2. Patient became more alert after narcan administration and BiPAP trial initiated. Patient developed acute encephalopathy and was not able to adhere to BiPAP and required transfer to the ICU, where he had to be sedated and intubated. Patient was intubated for 2 days and transitioned to BiPAP on May 10. On May 11 he was successfully switched to PO lasix. At time of discharge, patient had dry weight of 115 kg, recommend 40mg  of lasix daily PO. Echocardiogram during admission showed severe right heart dysfunction suspicious for cor pulmonale 2/2 PAH. Cardiology consulted and recommended right heart cath to be performed. Patient greatly desired to be discharged, heart catheterization scheduled for outpatient follow up.   Hypertension Improved with diuresis.  Remained normotensive off of antihypertensive.  Home antihypertensives held on discharge, can follow-up with PCP and restart if appropriate.  Chronic pain on suboxone  Polysubstance use Suboxone was restarted once mentation stabilized.  Encouraged tobacco cessation, patient seems mildly motivated to stop outpatient.    Aspiration PNA Has no oxygen requirement at baseline.  Required up to 13 L HFNC once extubated. Treated for aspiration pneumonia with unasyn for 6 days.  His oxygen was able to be weaned down to 1 L at the time of discharge.  He ambulated with pulse ox on day of discharge and required only 1 L of oxygen, DME order placed for home oxygen.  COPD Given his tobacco use, suspect COPD.  He only had albuterol to use  as needed prior to hospitalization.  We  started him on Dulera and Incruse Ellipta.  Tobacco use disorder Desired to cut back on smoking.  Provided with nicotine patches while hospitalized.  He was discharged with nicotine patches to continue to use.  Encouraged that he work with PCP for smoking cessation.  Discharge recommendations: Cardiology will pursue heart cath on outpatient basis given patient stability and preference for quick discharge.  Blood pressure: recommend holding amlodipine-benazepril until reassessment at PCP follow up due to hypotension in ICU Check weight and BMP due to lasix usage Concern for OSA, recommend sleep study for CPAP assessment Patient was unclear about how much suboxone he was taking, recommend discussing with patient the importance of medications and making medication regimen very clear Home Health PT  Significant Procedures: Intubation 5/8-5/10  Significant Labs and Imaging:  Recent Labs  Lab 02/08/22 0420 02/09/22 0547 02/11/22 0327  WBC 7.5 5.5 5.6  HGB 15.5 14.6 15.0  HCT 56.5* 52.8* 52.3*  PLT 147* 261 203   Recent Labs  Lab 02/08/22 1044 02/08/22 1404 02/09/22 0547 02/09/22 1739 02/10/22 0856 02/11/22 0327 02/12/22 0408  NA  --  137 136  --  135 134* 135  K  --  3.2* 5.2*  --  4.2 4.2 4.1  CL  --  81* 85*  --  88* 90* 95*  CO2  --  >45* 44*  --  38* 34* 33*  GLUCOSE  --  95 116*  --  84 106* 103*  BUN  --  16 18  --  11 15 10   CREATININE  --  0.67 0.60*  --  0.56* 0.48* 0.53*  CALCIUM  --  8.9 8.9  --  9.4 8.9 9.5  MG 2.0 2.1 2.1 2.1  --  2.0  --   PHOS 4.8* 4.9* 4.3 4.6  --  3.3  --    Echo 5/6 IMPRESSIONS   1. Left ventricular ejection fraction, by estimation, is >75%. The left  ventricle has hyperdynamic function. The left ventricle has no regional  wall motion abnormalities. There is mild left ventricular hypertrophy.  Left ventricular diastolic parameters  are consistent with Grade I diastolic dysfunction (impaired relaxation).  There is the interventricular  septum is flattened in systole and diastole,  consistent with right ventricular pressure and volume overload.   2. Right ventricular systolic function is severely reduced. The right  ventricular size is severely enlarged. There is moderately elevated  pulmonary artery systolic pressure.   3. Right atrial size was severely dilated.   4. A small pericardial effusion is present.   5. The mitral valve is normal in structure. No evidence of mitral valve  regurgitation. No evidence of mitral stenosis.   6. The aortic valve is tricuspid. Aortic valve regurgitation is not  visualized. Aortic valve sclerosis/calcification is present, without any  evidence of aortic stenosis.   7. Aortic dilatation noted. There is mild dilatation of the ascending  aorta, measuring 44 mm.   8. The inferior vena cava is dilated in size with <50% respiratory  variability, suggesting right atrial pressure of 15 mmHg.   DG Chest Port 1 View  Result Date: 02/11/2022 CLINICAL DATA:  56 year old male with respiratory failure. EXAM: PORTABLE CHEST 1 VIEW COMPARISON:  Imaging from Feb 09, 2022. FINDINGS: Post extubation.  EKG leads project over the chest. LEFT lower chest excluded from view partially on today's study. Lung volumes are diminished. Accounting for this cardiomediastinal contours and hilar structures are stable.  Removal of gastric tube as well. Patchy bibasilar airspace disease.  No visible pneumothorax. On limited assessment there is no acute skeletal process. IMPRESSION: 1. Post extubation with diminished lung volumes and patchy bibasilar airspace disease, more likely atelectasis. Suggest attention on follow-up and correlation with any symptoms that would suggest infection. 2. Removal of gastric tube. Electronically Signed   By: Donzetta Kohut M.D.   On: 02/11/2022 07:59    5/8 CT angio chest PE Impression: Left lower lobe collapse and right basilar atelectasis.  Trace, right greater than left pleural effusions.  No  evidence of PE.  Enlarged main and branch pulmonary arteries as can be seen in pulmonary hypertension.  Cardiomegaly with trace pericardial effusion.  5/6 limited abdominal ultrasound Impression: Negative for ascites  Results/Tests Pending at Time of Discharge: None   Discharge Medications:  Allergies as of 02/12/2022       Reactions   Bee Venom Anaphylaxis   Clonidine Derivatives Other (See Comments)   Stomach pain        Medication List     STOP taking these medications    amLODipine-benazepril 10-40 MG capsule Commonly known as: LOTREL       TAKE these medications    acetaminophen 500 MG tablet Commonly known as: TYLENOL Take 500 mg by mouth at bedtime.   albuterol 108 (90 Base) MCG/ACT inhaler Commonly known as: VENTOLIN HFA Inhale 2 puffs into the lungs every 6 (six) hours as needed for wheezing.   Buprenorphine HCl-Naloxone HCl 4-1 MG Film Place 1 Film under the tongue 4 (four) times daily.   furosemide 40 MG tablet Commonly known as: LASIX Take 1 tablet (40 mg total) by mouth daily. Start taking on: Feb 13, 2022   loratadine 10 MG tablet Commonly known as: CLARITIN Take 10 mg by mouth at bedtime.   mometasone-formoterol 100-5 MCG/ACT Aero Commonly known as: DULERA Inhale 2 puffs into the lungs 2 (two) times daily.   nicotine 21 mg/24hr patch Commonly known as: NICODERM CQ - dosed in mg/24 hours Place 1 patch (21 mg total) onto the skin daily. Start taking on: Feb 13, 2022   umeclidinium bromide 62.5 MCG/ACT Aepb Commonly known as: INCRUSE ELLIPTA Inhale 1 puff into the lungs daily. Start taking on: Feb 13, 2022               Durable Medical Equipment  (From admission, onward)           Start     Ordered   02/12/22 1011  For home use only DME oxygen  Once       Question Answer Comment  Length of Need 6 Months   Mode or (Route) Nasal cannula   Liters per Minute 1   Frequency Continuous (stationary and portable oxygen unit  needed)   Oxygen conserving device Yes   Oxygen delivery system Gas      02/12/22 1010            Discharge Instructions: Please refer to Patient Instructions section of EMR for full details.  Patient was counseled important signs and symptoms that should prompt return to medical care, changes in medications, dietary instructions, activity restrictions, and follow up appointments.   Follow-Up Appointments:  Follow-up Information     Cannon Kettle, PA-C Follow up on 02/23/2022.   Specialty: Internal Medicine Why: At 10:05AM for your post hospital cardiology follow up appointment Contact information: [address] [place] [address] [phone]  Center, [facility] Schedule an appointment as soon as possible for a visit in 1 week(s).   Why: Call your primary care clinic for a hospital follow up appointment. Should be within about a week of discharge. Contact information: [address] [place] [address] [phone]         Lewayne Bunting, MD .   Specialty: Cardiology Contact information: [address] [place] [address] [phone]         [facility] Follow up.   Specialty: Hospice and Palliative Medicine Why: Outpatient Palliative Services- office to call with visit time Contact information: [address] [facility] [address] [phone]                Sabino Dick, DO 02/12/2022, 10:17 AM PGY-2, [facility]

## 2022-02-14 ENCOUNTER — Telehealth: Payer: Self-pay

## 2022-02-14 ENCOUNTER — Other Ambulatory Visit (HOSPITAL_COMMUNITY): Payer: Self-pay

## 2022-02-14 NOTE — Telephone Encounter (Signed)
A Prior Authorization was initiated for this patients INCRUSE through CoverMyMeds.  ? ?Key: [id number] ? ?

## 2022-02-14 NOTE — Telephone Encounter (Signed)
Prior Auth for patients medication INCRUSE approved by [employer] from 02/14/22 to 02/14/23. ? ?Key: [id number]  ? ? ?
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
## 2022-02-23 ENCOUNTER — Ambulatory Visit: Payer: Medicaid Other | Admitting: Physician Assistant

## 2022-02-23 DIAGNOSIS — Z720 Tobacco use: Secondary | ICD-10-CM

## 2022-02-23 DIAGNOSIS — I2781 Cor pulmonale (chronic): Secondary | ICD-10-CM

## 2022-02-23 DIAGNOSIS — I10 Essential (primary) hypertension: Secondary | ICD-10-CM
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
## 2022-03-07 ENCOUNTER — Ambulatory Visit: Payer: Medicaid Other | Admitting: Physician Assistant

## 2022-03-11 ENCOUNTER — Encounter: Payer: Self-pay | Admitting: Physician Assistant

## 2022-05-08 ENCOUNTER — Emergency Department (HOSPITAL_COMMUNITY)
Admission: EM | Admit: 2022-05-08 | Discharge: 2022-05-08 | Disposition: A | Discharge status: Home / Self Care | Payer: Medicaid Other | Attending: Emergency Medicine | Admitting: Emergency Medicine

## 2022-05-08 ENCOUNTER — Emergency Department (HOSPITAL_COMMUNITY): Payer: Medicaid Other

## 2022-05-08 DIAGNOSIS — Z79899 Other long term (current) drug therapy: Secondary | ICD-10-CM | POA: Insufficient documentation

## 2022-05-08 DIAGNOSIS — X509XXA Other and unspecified overexertion or strenuous movements or postures, initial encounter: Secondary | ICD-10-CM | POA: Diagnosis not present

## 2022-05-08 DIAGNOSIS — I10 Essential (primary) hypertension: Secondary | ICD-10-CM | POA: Diagnosis not present

## 2022-05-08 DIAGNOSIS — Z7951 Long term (current) use of inhaled steroids: Secondary | ICD-10-CM | POA: Insufficient documentation

## 2022-05-08 DIAGNOSIS — S99911A Unspecified injury of right ankle, initial encounter: Secondary | ICD-10-CM | POA: Diagnosis present

## 2022-05-08 DIAGNOSIS — M79671 Pain in right foot: Secondary | ICD-10-CM

## 2022-05-08 DIAGNOSIS — J449 Chronic obstructive pulmonary disease, unspecified: Secondary | ICD-10-CM | POA: Diagnosis not present

## 2022-05-08 DIAGNOSIS — S93401A Sprain of unspecified ligament of right ankle, initial encounter: Secondary | ICD-10-CM | POA: Insufficient documentation

## 2022-05-08 MED ORDER — BUPRENORPHINE HCL-NALOXONE HCL 8-2 MG SL SUBL: 1.0000 | SUBLINGUAL_TABLET | Freq: Once | SUBLINGUAL | Status: DC

## 2022-05-08 NOTE — ED Provider Notes (Signed)
[facility] EMERGENCY DEPARTMENT Provider Note   CSN: [id number] Arrival date & time: 05/08/22  1552     History  Chief Complaint  Patient presents with   Foot Pain    Trevor Thomas is a 56 y.o. male with a history of COPD, HTN, HFpEF, chronic pain syndrome (on suboxone) presenting with acute right foot pain. He was stepping down out of a camper to turn right, so his foot was angled and he had a log of pain and then heard a pop. He has been minimally able to use that foot and has been overusing his left foot, which caused it to also have pain/paresthesias as well.  Patient does note that he has chronic pain syndrome and is on chronic suboxone. He feels that the pain has not improved in that foot since the incident. He is barely able to move it without significant pain.  Patient has a PCP and wants to be evaluated by sports medicine or ortho, but PCP was not available until the end of the month.      Home Medications Prior to Admission medications   Medication Sig Start Date End Date Taking? Authorizing Provider  acetaminophen (TYLENOL) 500 MG tablet Take 500 mg by mouth at bedtime.    [provider]  albuterol (PROVENTIL HFA;VENTOLIN HFA) 108 (90 Base) MCG/ACT inhaler Inhale 2 puffs into the lungs every 6 (six) hours as needed for wheezing. 01/30/17   Renne Musca, MD  Buprenorphine HCl-Naloxone HCl 4-1 MG FILM Place 1 Film under the tongue 4 (four) times daily.    [provider]  furosemide (LASIX) 40 MG tablet Take 1 tablet (40 mg total) by mouth daily. 02/13/22   Sabino Dick, DO  loratadine (CLARITIN) 10 MG tablet Take 10 mg by mouth at bedtime.    [provider]  mometasone-formoterol (DULERA) 100-5 MCG/ACT AERO Inhale 2 puffs into the lungs 2 (two) times daily. 02/12/22   Sabino Dick, DO  nicotine (NICODERM CQ - DOSED IN MG/24 HOURS) 21 mg/24hr patch Place 1 patch (21 mg total) onto the skin daily. 02/13/22   Sabino Dick, DO  umeclidinium bromide (INCRUSE ELLIPTA) 62.5 MCG/ACT AEPB Inhale 1 puff into the lungs daily. 02/13/22   Sabino Dick, DO      Allergies    Bee venom and Clonidine derivatives    Review of Systems   Review of Systems  Constitutional:  Negative for appetite change and fever.  HENT:  Negative for congestion.   Respiratory:  Negative for choking, chest tightness and shortness of breath.   Cardiovascular:  Negative for chest pain, palpitations and leg swelling.  Gastrointestinal:  Negative for abdominal pain, nausea and vomiting.  Genitourinary:  Negative for difficulty urinating.  Musculoskeletal:  Positive for gait problem (due to right ankle pain) and joint swelling (right ankle). Negative for arthralgias.  Neurological:  Negative for weakness.    Physical Exam Updated Vital Signs BP (!) 134/95   Pulse 83   Temp 98.2 F (36.8 C) (Oral)   Resp 17   SpO2 95%  Physical Exam Constitutional:      Appearance: Normal appearance.  HENT:     Head: Normocephalic and atraumatic.     Nose: Nose normal.     Mouth/Throat:     Mouth: Mucous membranes are moist.  Eyes:     Extraocular Movements: Extraocular movements intact.     Conjunctiva/sclera: Conjunctivae normal.  Cardiovascular:     Rate and Rhythm: Normal rate and  regular rhythm.     Pulses: Normal pulses.     Heart sounds: Normal heart sounds.  Pulmonary:     Effort: Pulmonary effort is normal.     Breath sounds: Normal breath sounds.  Abdominal:     General: Abdomen is flat.     Palpations: Abdomen is soft.  Musculoskeletal:     Cervical back: Normal range of motion and neck supple.     Right ankle: Swelling present. Tenderness present over the lateral malleolus, medial malleolus, ATF ligament, CF ligament and base of 5th metatarsal. Decreased range of motion.     Right foot: Swelling present. Normal pulse.     Left foot: Normal pulse.  Neurological:     Mental Status: He is alert.     ED Results  / Procedures / Treatments   Labs (all labs ordered are listed, but only abnormal results are displayed) Labs Reviewed - No data to display  EKG None  Radiology DG Foot Complete Left  Result Date: 05/08/2022 CLINICAL DATA:  Larey Seat, pain EXAM: LEFT FOOT - COMPLETE 3+ VIEW COMPARISON:  None Available. FINDINGS: Frontal, oblique, and lateral views of the left foot are obtained. No acute displaced fracture, subluxation, or dislocation. Mild dorsal soft tissue swelling of the forefoot. IMPRESSION: 1. Dorsal soft tissue swelling of the forefoot. 2. No acute bony abnormality. Electronically Signed   By: Sharlet Salina M.D.   On: 05/08/2022 16:48   DG Foot Complete Right  Result Date: 05/08/2022 CLINICAL DATA:  Right foot pain and tingling. Patient felt a pop last night. EXAM: RIGHT FOOT COMPLETE - 3+ VIEW COMPARISON:  Ankle radiographs same date. FINDINGS: The mineralization and alignment are normal. There is no evidence of acute fracture or dislocation. Mild degenerative changes at the 1st metatarsophalangeal joint. Benign bone island in the 1st proximal phalanx. There are mild midfoot degenerative changes and a small plantar calcaneal spur. No focal soft tissue abnormality identified. IMPRESSION: No acute osseous findings. Electronically Signed   By: Carey Bullocks M.D.   On: 05/08/2022 16:48   DG Ankle Complete Left  Result Date: 05/08/2022 CLINICAL DATA:  Left ankle pain. EXAM: LEFT ANKLE COMPLETE - 3+ VIEW COMPARISON:  None Available. FINDINGS: There is no evidence of fracture, dislocation, or joint effusion. Cortical irregularity of the posterior malleolus is felt to be congenital or due to prior trauma. No fracture line is seen. Soft tissues are unremarkable. IMPRESSION: 1. No acute fracture or dislocation identified about the left ankle. 2. Cortical irregularity of the posterior malleolus is felt to be congenital or due to prior trauma. Electronically Signed   By: Ted Mcalpine M.D.   On:  05/08/2022 16:45   DG Ankle Complete Right  Result Date: 05/08/2022 CLINICAL DATA:  Tingling numbness and pain in the right foot and ankle. EXAM: RIGHT ANKLE - COMPLETE 3+ VIEW COMPARISON:  None Available. FINDINGS: There is no evidence of fracture, dislocation, or joint effusion. There is no evidence of arthropathy or other focal bone abnormality. Soft tissues are unremarkable. IMPRESSION: Negative. Electronically Signed   By: Ted Mcalpine M.D.   On: 05/08/2022 16:44    Procedures Procedures   Medications Ordered in ED Medications - No data to display  ED Course/ Medical Decision Making/ A&P                           Medical Decision Making Amount and/or Complexity of Data Reviewed Radiology: ordered.   Trevor Thomas is  a 56 y.o. male with a history of COPD, HTN, HFpEF, chronic pain syndrome (on suboxone) presenting with acute right foot pain.  X-rays of the foot were negative for acute fracture. Patient in significant pain in all areas of the foot, so difficult to assess which ligaments are truly affected. Patient was agreeable to using his home medications for pain control.  Patient was fitted with a Cam Walker and also given crutches. Patient instructed to follow-up with orthopedics and given return precautions.    Final Clinical Impression(s) / ED Diagnoses Final diagnoses:  Foot pain, right  Sprain of right ankle, unspecified ligament, initial encounter    Rx / DC Orders ED Discharge Orders     None         Briannah Lona, DO 05/08/22 2041    Charlynne Pander, MD 05/12/22 [id number]

## 2022-05-08 NOTE — ED Triage Notes (Signed)
Pt from home via [facility] for eval of "pop" in bilatral feet, onset last night after stepping down from trailer. Pt states tingling/numbness & pain worse in R foot, bilateral CNS intact distal to injury. Home tylenol & suboxone for pain

## 2022-05-08 NOTE — ED Notes (Signed)
Ortho called for CAM boot and crutches

## 2022-05-08 NOTE — Discharge Instructions (Addendum)
You were seen due to right ankle pain. X-rays showed that you did not fracture any of the bones, you likely had a significant ankle sprain. Keep using your home Suboxone, Tylenol, and Ibuprofen as needed for the pain. I recommend icing the area as well for 15 minutes at a time.   Please contact the orthopedic group listed to schedule follow-up.

## 2022-05-08 NOTE — Progress Notes (Signed)
Orthopedic Tech Progress Note Patient Details:  Trevor Thomas 12-28-1965 [id number]  Ortho Devices Type of Ortho Device: Crutches, CAM walker Ortho Device/Splint Location: rle Ortho Device/Splint Interventions: Ordered, Application, Adjustment   Post Interventions Patient Tolerated: Well Instructions Provided: Care of device, Adjustment of device  [name] Post 05/08/2022, 9:08 PM

## 2022-12-05 ENCOUNTER — Emergency Department (HOSPITAL_COMMUNITY)
Admission: EM | Admit: 2022-12-05 | Discharge: 2022-12-05 | Disposition: A | Discharge status: Home / Self Care | Payer: Medicaid Other | Attending: Emergency Medicine | Admitting: Emergency Medicine

## 2022-12-05 ENCOUNTER — Other Ambulatory Visit: Payer: Self-pay

## 2022-12-05 ENCOUNTER — Encounter (HOSPITAL_COMMUNITY): Payer: Self-pay

## 2022-12-05 DIAGNOSIS — S0990XA Unspecified injury of head, initial encounter: Secondary | ICD-10-CM

## 2022-12-05 MED ORDER — KETOROLAC TROMETHAMINE 15 MG/ML IJ SOLN
15.0000 mg | Freq: Once | INTRAMUSCULAR | Status: AC
  Administered 2022-12-05: 15 mg via INTRAMUSCULAR
  Filled 2022-12-05: qty 1
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
## 2022-12-05 NOTE — Discharge Instructions (Signed)
Please return for worsening headache confusion or persistent vomiting
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
## 2023-01-17 ENCOUNTER — Emergency Department (HOSPITAL_COMMUNITY): Payer: Medicaid Other

## 2023-01-17 ENCOUNTER — Emergency Department (HOSPITAL_COMMUNITY)
Admission: EM | Admit: 2023-01-17 | Discharge: 2023-01-17 | Disposition: A | Discharge status: Home / Self Care | Payer: Medicaid Other | Attending: Emergency Medicine | Admitting: Emergency Medicine

## 2023-01-17 ENCOUNTER — Encounter (HOSPITAL_COMMUNITY): Payer: Self-pay

## 2023-01-17 ENCOUNTER — Other Ambulatory Visit: Payer: Self-pay

## 2023-01-17 DIAGNOSIS — X509XXA Other and unspecified overexertion or strenuous movements or postures, initial encounter: Secondary | ICD-10-CM | POA: Diagnosis not present

## 2023-01-17 DIAGNOSIS — S76011A Strain of muscle, fascia and tendon of right hip, initial encounter: Secondary | ICD-10-CM | POA: Diagnosis not present

## 2023-01-17 DIAGNOSIS — M25551 Pain in right hip: Secondary | ICD-10-CM | POA: Diagnosis present

## 2023-01-17 DIAGNOSIS — S76019A Strain of muscle, fascia and tendon of unspecified hip, initial encounter: Secondary | ICD-10-CM

## 2023-01-17 DIAGNOSIS — Y9389 Activity, other specified: Secondary | ICD-10-CM | POA: Diagnosis not present

## 2023-01-17 MED ORDER — KETOROLAC TROMETHAMINE 30 MG/ML IJ SOLN
15.0000 mg | Freq: Once | INTRAMUSCULAR | Status: AC
  Administered 2023-01-17: 15 mg via INTRAMUSCULAR
  Filled 2023-01-17: qty 1

## 2023-01-17 MED ORDER — IBUPROFEN 600 MG PO TABS: 600.0000 mg | ORAL_TABLET | Freq: Four times a day (QID) | ORAL | 0 refills | Status: AC | PRN

## 2023-01-17 MED ORDER — METHOCARBAMOL 500 MG PO TABS
500.0000 mg | ORAL_TABLET | Freq: Once | ORAL | Status: AC
  Administered 2023-01-17: 500 mg via ORAL
  Filled 2023-01-17: qty 1

## 2023-01-17 MED ORDER — METHOCARBAMOL 500 MG PO TABS: 500.0000 mg | ORAL_TABLET | Freq: Two times a day (BID) | ORAL | 0 refills | Status: AC

## 2023-01-17 MED ORDER — ACETAMINOPHEN 325 MG PO TABS
650.0000 mg | ORAL_TABLET | Freq: Once | ORAL | Status: AC
Start: 2023-01-17 — End: 2023-01-17
  Administered 2023-01-17: 650 mg via ORAL
  Filled 2023-01-17: qty 2

## 2023-01-17 NOTE — ED Provider Notes (Signed)
[name] EMERGENCY DEPARTMENT AT [facility] Provider Note   CSN: [id number] Arrival date & time: 01/17/23  1520     History  Chief Complaint  Patient presents with   Hip Pain    Trevor Thomas is a 57 y.o. male.  HPI     57 year old male comes in with chief complaint of right hip pain.  Patient states that he was playing with his grandkids when he heard a sudden pop in his right groin area.  Ever since then he has been having pain in the right groin region and close to his scrotal region.  There is no swelling.  The pain is worse with any activity.  He takes Suboxone for pain, which has not alleviating the symptoms completely.  He also indicates having some numbness like feeling in the thigh region.  Home Medications Prior to Admission medications   Medication Sig Start Date End Date Taking? Authorizing Provider  ibuprofen (ADVIL) 600 MG tablet Take 1 tablet (600 mg total) by mouth every 6 (six) hours as needed. 01/17/23  Yes [name], MD  methocarbamol (ROBAXIN) 500 MG tablet Take 1 tablet (500 mg total) by mouth 2 (two) times daily. 01/17/23  Yes [name], MD  acetaminophen (TYLENOL) 500 MG tablet Take 500 mg by mouth at bedtime.    [provider]  albuterol (PROVENTIL HFA;VENTOLIN HFA) 108 (90 Base) MCG/ACT inhaler Inhale 2 puffs into the lungs every 6 (six) hours as needed for wheezing. 01/30/17   Renne Musca, MD  Buprenorphine HCl-Naloxone HCl 4-1 MG FILM Place 1 Film under the tongue 4 (four) times daily.    [provider]  furosemide (LASIX) 40 MG tablet Take 1 tablet (40 mg total) by mouth daily. 02/13/22   Sabino Dick, DO  loratadine (CLARITIN) 10 MG tablet Take 10 mg by mouth at bedtime.    [provider]  mometasone-formoterol (DULERA) 100-5 MCG/ACT AERO Inhale 2 puffs into the lungs 2 (two) times daily. 02/12/22   Sabino Dick, DO  nicotine (NICODERM CQ - DOSED IN MG/24 HOURS) 21 mg/24hr patch Place 1  patch (21 mg total) onto the skin daily. 02/13/22   Sabino Dick, DO  umeclidinium bromide (INCRUSE ELLIPTA) 62.5 MCG/ACT AEPB Inhale 1 puff into the lungs daily. 02/13/22   Sabino Dick, DO      Allergies    Bee venom and Clonidine derivatives    Review of Systems   Review of Systems  All other systems reviewed and are negative.   Physical Exam Updated Vital Signs BP 132/87   Pulse 78   Temp 98.2 F (36.8 C) (Oral)   Resp 18   Ht 5\' 11"  (1.803 m)   Wt 100.7 kg   SpO2 95%   BMI 30.96 kg/m  Physical Exam Vitals and nursing note reviewed.  Constitutional:      Appearance: He is well-developed.  HENT:     Head: Atraumatic.  Cardiovascular:     Rate and Rhythm: Normal rate.  Pulmonary:     Effort: Pulmonary effort is normal.  Musculoskeletal:        General: Tenderness present.     Cervical back: Neck supple.     Comments: Tenderness with palpation of the abductor muscle region.  Patient also has tenderness with active internal and external rotation of the hip and with active leg raise.  No tenderness to palpation over the posterior hip region.  Skin:    General: Skin is warm.  Neurological:  Mental Status: He is alert and oriented to person, place, and time.     ED Results / Procedures / Treatments   Labs (all labs ordered are listed, but only abnormal results are displayed) Labs Reviewed - No data to display  EKG None  Radiology DG Hip Unilat W or Wo Pelvis 2-3 Views Right  Result Date: 01/17/2023 CLINICAL DATA:  Right hip pain for 2 days. EXAM: DG HIP (WITH OR WITHOUT PELVIS) 2-3V RIGHT COMPARISON:  None Available. FINDINGS: There is no evidence of hip fracture or dislocation. Mild superolateral hip joint space narrowing with marginal spurring. Status post left hip arthroplasty with intact hardware. IMPRESSION: 1. No acute fracture or dislocation. 2. Mild right hip osteoarthritis. Electronically Signed   By: Larose Hires D.O.   On: 01/17/2023  16:44    Procedures Procedures    Medications Ordered in ED Medications  ketorolac (TORADOL) 30 MG/ML injection 15 mg (has no administration in time range)  methocarbamol (ROBAXIN) tablet 500 mg (has no administration in time range)  acetaminophen (TYLENOL) tablet 650 mg (has no administration in time range)    ED Course/ Medical Decision Making/ A&P                             Medical Decision Making Amount and/or Complexity of Data Reviewed Radiology: ordered.  Risk OTC drugs. Prescription drug management.  57 year old patient comes in with chief complaint of right hip/leg pain.  Symptoms have been present for 2 days. Symptoms provoked with twisting type injury that occurred while he was playing with his grandchild.  Patient has 2+ dorsalis pedis bilaterally.  No gross swelling.  No signs of infection and no signs of DVT.  He has no back pain and no hip pain on my exam.  Differential diagnosis considered for this patient includes hip fracture, severe hip arthritis, abductor muscle strain, muscle tear.  Patient states that some of the pain is similar to his arthritis pain on the left side that required hip replacement.  X-ray of the hip ordered, it was independently interpreted.  There is no evidence of fracture.  Plan is to treat this conservatively.  Advised orthopedic follow-up if not getting better.  Final Clinical Impression(s) / ED Diagnoses Final diagnoses:  Hip strain, initial encounter    Rx / DC Orders ED Discharge Orders          Ordered    ibuprofen (ADVIL) 600 MG tablet  Every 6 hours PRN        01/17/23 1701    methocarbamol (ROBAXIN) 500 MG tablet  2 times daily        01/17/23 1701              [name], MD 01/17/23 1705

## 2023-01-17 NOTE — ED Triage Notes (Signed)
"  Call came out for right hip pain with tingling to right leg x 2 days. He does have unequal blood pressures in his arms. Left arm 208 systolic, right arm 140 systolic. He stated he has been told this in the past" per EMS

## 2023-01-17 NOTE — Discharge Instructions (Signed)
We suspect that you likely have a strain of the thigh muscle/quadricep muscle.  It is possible that you might have had a tear because you heard a pop.  X-rays are indicative of mild arthritis, and it is less likely the cause for your pain.  We recommend that you take ibuprofen and apply cold compresses to the area of pain. You may switch to heat therapy after 2 days. If your symptoms or not improving, call the orthopedist at the number provided.

## 2023-01-29 IMAGING — CT CT CHEST HIGH RESOLUTION
2 of 7 series · 13 of 36 positions shown, 16 images · non-contrast
Comparison: 04/17/2008.

CLINICAL DATA: Shortness of breath on exertion.



[Series 5: hi res thins · axial · 0.88mm/px · z∈[+1135,+1385]mm · 10 of 302 slices shown, 13 images]
[im 26/302  mediastinal]
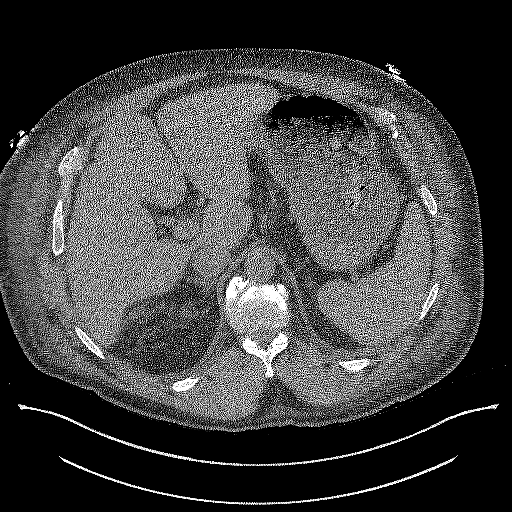
[im 26/302  lung]
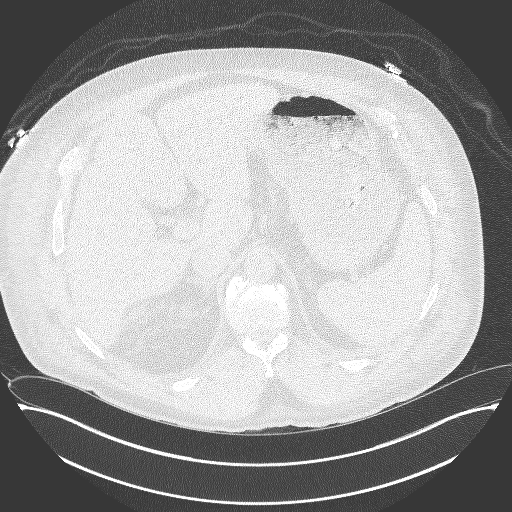
[im 51/302  lung]
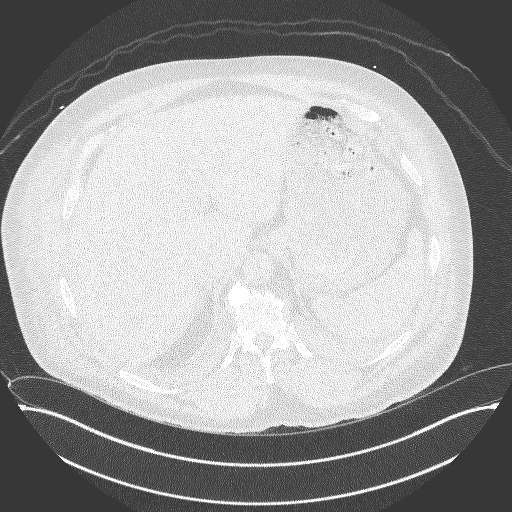
[im 76/302  lung]
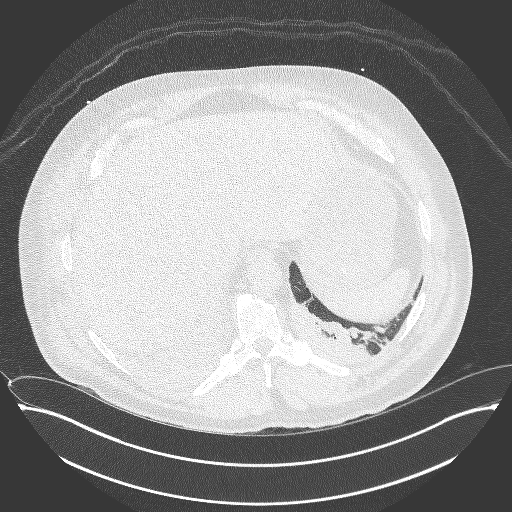
[im 101/302  lung]
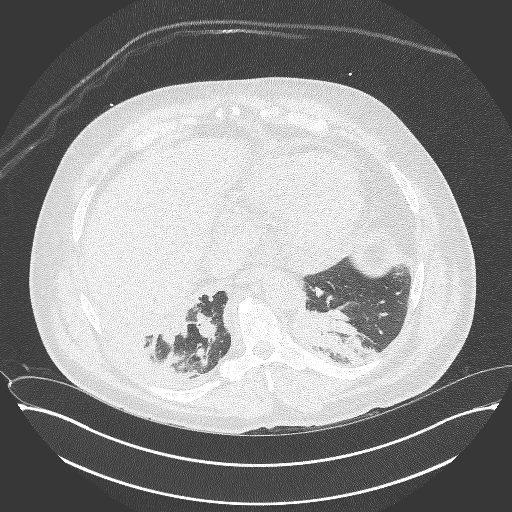
[im 126/302  mediastinal]
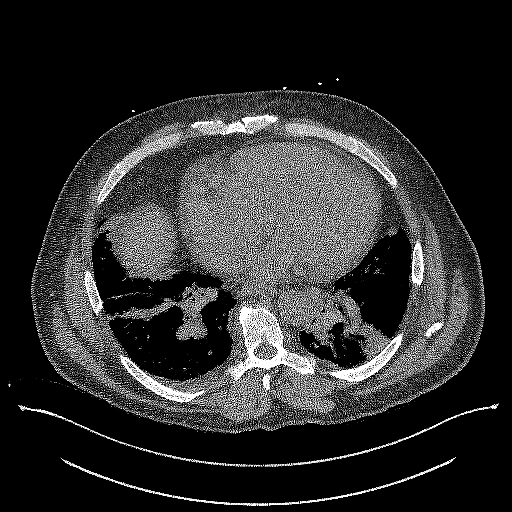
[im 126/302  lung]
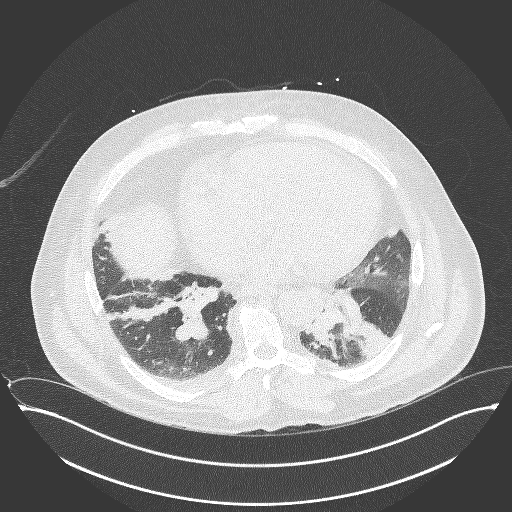
[im 176/302  lung]
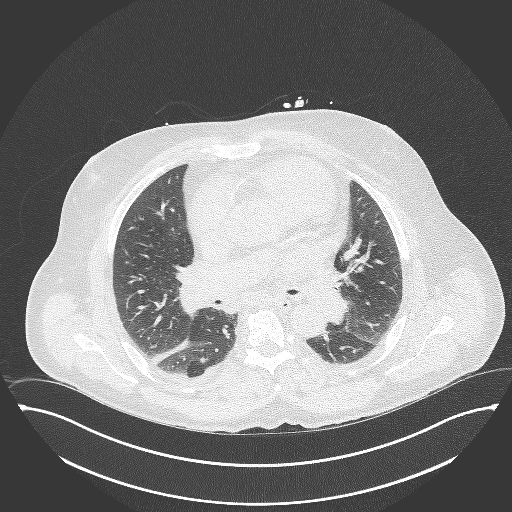
[im 201/302  lung]
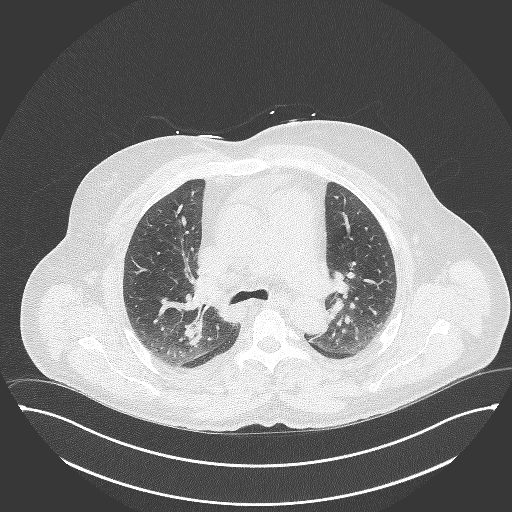
[im 226/302  lung]
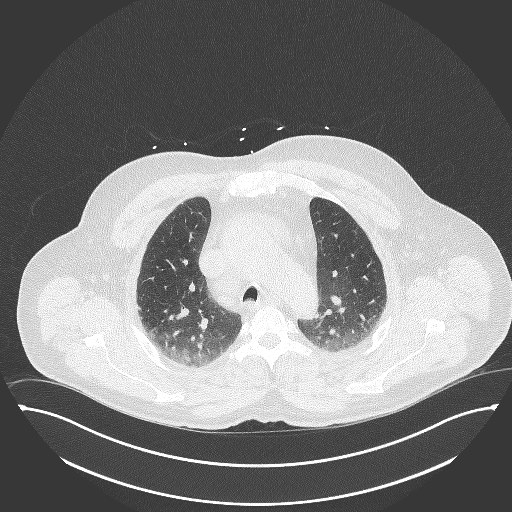
[im 251/302  mediastinal]
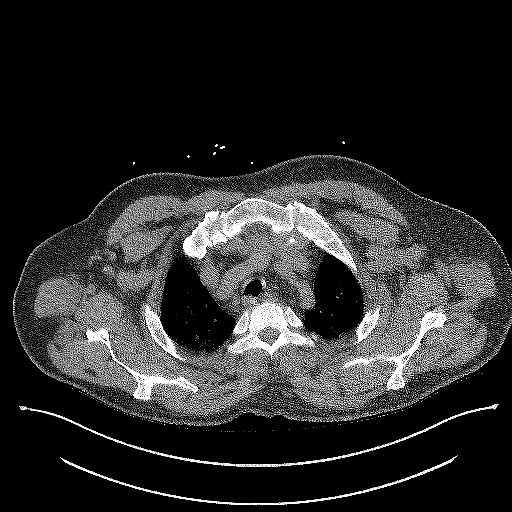
[im 251/302  lung]
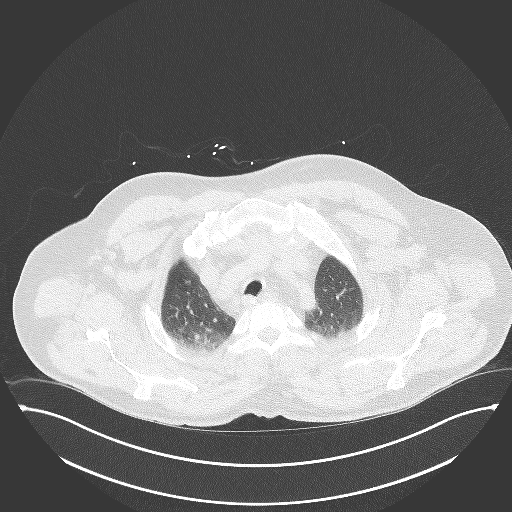
[im 276/302  lung]
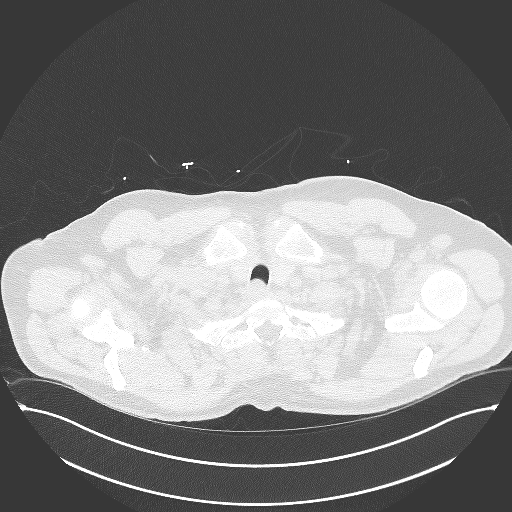

[Series 9: coronals · coronal · 0.60mm/px · 3 of 189 slices shown]
[im 38/189  lung]
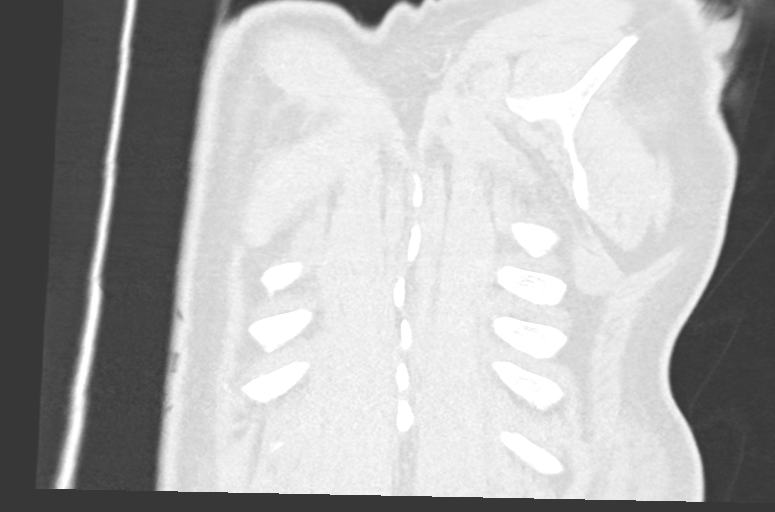
[im 76/189  lung]
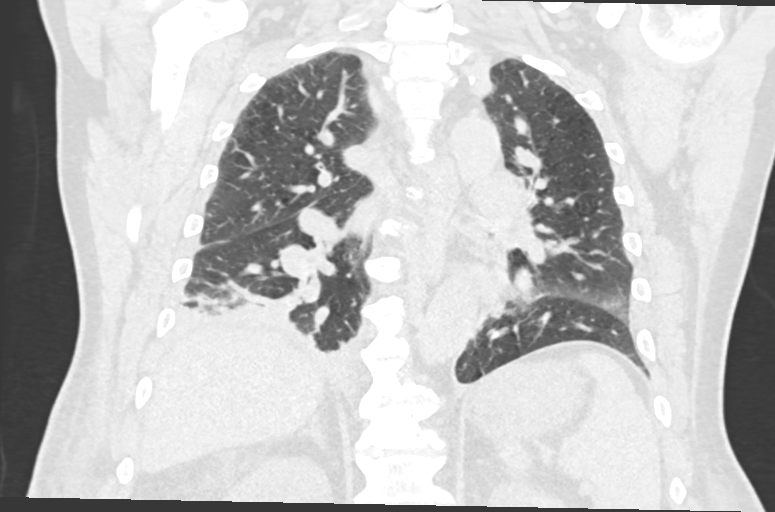
[im 113/189  lung]
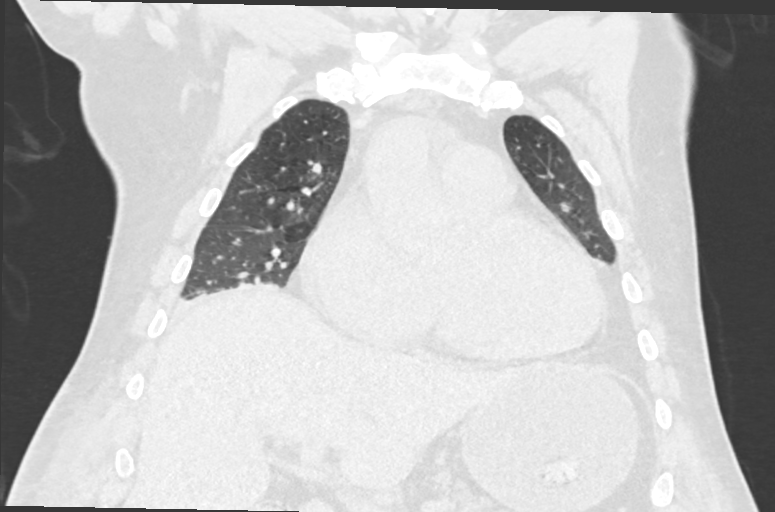

[13 of 36 positions shown; findings below may reference images not displayed]

FINDINGS: Cardiovascular: Atherosclerotic calcification of the aorta, aortic
valve and coronary arteries. Pulmonic trunk and heart are enlarged.
Small pericardial effusion. Ascending aorta measures up to 4.4 cm,
previously 3.7 cm.

Mediastinum/Nodes: Mediastinal lymph nodes are not enlarged by CT
size criteria. Hilar regions are difficult to definitively evaluate
without IV contrast. No axillary adenopathy. Esophagus is grossly
unremarkable.

Lungs/Pleura: Image quality is degraded by expiratory phase imaging
and respiratory motion. Collapse/consolidation in both lower lobes
with minimal involvement of the right middle lobe and lingula. Mild
left infrahilar fullness (6/79). Assessment for interstitial lung
disease is therefore challenging but the aerated portions of the
lungs show no abnormalities suspicious for fibrotic lung disease.
Tiny right pleural effusion. Airway is otherwise unremarkable.

Upper Abdomen: Visualized portions of the liver, gallbladder,
adrenal glands, kidneys, spleen, pancreas, stomach and bowel are
grossly unremarkable.

Musculoskeletal: Degenerative changes in the spine. No worrisome
lytic or sclerotic lesions.
IMPRESSION: 1. Assessment for interstitial lung disease is limited by expiratory
phase imaging, respiratory motion and collapse/consolidation in the
lung bases.
2. Bibasilar atelectasis and/or pneumonia. Left infrahilar fullness
is likely related. Recommend follow-up CT chest with contrast in 3-4
weeks to ensure clearing, as a centrally obstructing mass is
difficult to definitively exclude.
3. Small pericardial effusion.
4. Tiny right pleural effusion.
5. Ascending aortic aneurysm. Recommend annual imaging followup by
CTA or MRA. This recommendation follows 0878
ACCF/AHA/AATS/ACR/ASA/SCA/LUBNA/MISIKE/NICKCOLA/MAHATMA Guidelines for the
Diagnosis and Management of Patients with Thoracic Aortic Disease.
Circulation. 0878; 121: [id number]. Aortic aneurysm NOS
(Y6UB7-FUW.6).
6. Aortic atherosclerosis (Y6UB7-[id number].U). Coronary artery
calcification.
7. Enlarged pulmonic trunk, indicative of pulmonary arterial
hypertension.

## 2023-04-02 ENCOUNTER — Other Ambulatory Visit: Payer: Self-pay

## 2023-04-02 ENCOUNTER — Encounter (HOSPITAL_COMMUNITY): Payer: Self-pay | Admitting: Emergency Medicine

## 2023-04-02 ENCOUNTER — Emergency Department (HOSPITAL_COMMUNITY): Payer: Medicaid Other

## 2023-04-02 ENCOUNTER — Emergency Department (HOSPITAL_COMMUNITY)
Admission: EM | Admit: 2023-04-02 | Discharge: 2023-04-02 | Disposition: A | Discharge status: Home / Self Care | Payer: Medicaid Other | Attending: Emergency Medicine | Admitting: Emergency Medicine

## 2023-04-02 DIAGNOSIS — J449 Chronic obstructive pulmonary disease, unspecified: Secondary | ICD-10-CM | POA: Insufficient documentation

## 2023-04-02 DIAGNOSIS — M25551 Pain in right hip: Secondary | ICD-10-CM | POA: Insufficient documentation

## 2023-04-02 DIAGNOSIS — R200 Anesthesia of skin: Secondary | ICD-10-CM | POA: Diagnosis not present

## 2023-04-02 DIAGNOSIS — Z96642 Presence of left artificial hip joint: Secondary | ICD-10-CM | POA: Insufficient documentation

## 2023-04-02 DIAGNOSIS — I10 Essential (primary) hypertension: Secondary | ICD-10-CM | POA: Diagnosis not present

## 2023-04-02 MED ORDER — CYCLOBENZAPRINE HCL 10 MG PO TABS: 10.0000 mg | ORAL_TABLET | Freq: Two times a day (BID) | ORAL | 0 refills | Status: AC | PRN

## 2023-04-02 MED ORDER — KETOROLAC TROMETHAMINE 15 MG/ML IJ SOLN
15.0000 mg | Freq: Once | INTRAMUSCULAR | Status: AC
  Administered 2023-04-02: 15 mg via INTRAMUSCULAR
  Filled 2023-04-02: qty 1

## 2023-04-02 MED ORDER — LIDOCAINE 5 % EX PTCH
1.0000 | MEDICATED_PATCH | CUTANEOUS | Status: DC
  Administered 2023-04-02: 1 via TRANSDERMAL
  Filled 2023-04-02: qty 1

## 2023-04-02 MED ORDER — CYCLOBENZAPRINE HCL 10 MG PO TABS
10.0000 mg | ORAL_TABLET | Freq: Once | ORAL | Status: AC
  Administered 2023-04-02: 10 mg via ORAL
  Filled 2023-04-02: qty 1

## 2023-04-02 NOTE — ED Triage Notes (Signed)
Pt bib ems for right hip pain. Yesterday while walking started to slip and caught self from falling but felt a pop in right hip. Pt is worse today. Pt radiates  to right testicle.

## 2023-04-02 NOTE — ED Provider Notes (Signed)
[place] EMERGENCY DEPARTMENT AT [facility] Provider Note   CSN: [id number] Arrival date & time: 04/02/23  1659     History  Chief Complaint  Patient presents with   Hip Pain    Trevor Thomas is a 57 y.o. male with medical history of COPD, DDD, hypertension, narcotic dependence on Suboxone, chronic pain, bilateral leg numbness.  Patient presents to ED for evaluation of right hip pain.  Patient reports that he was in his yard yesterday.  Patient states that he turned a corner and began to walk up a flight of stairs when he had a sudden sensation of pain in his right hip and heard a "pop".  Patient states that since this time he has had hard time with ambulation and applying weight to his right lower extremity.  He has a history of pain in his right hip.  He was seen for the same complaint 2 months ago and told a very similar story.  The patient was given Toradol, Tylenol, muscle relaxer and discharged with outpatient orthopedic referral.  The patient reports he has not followed up with orthopedics yet.  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  He is unsure of who did the operation on his left hip. He reports that he is currently taking Suboxone and this did not fully alleviate the pain.  He reports he took Tylenol 4 hours ago.  Denies hitting his head or falling.  He reports his pain will radiate into his right testicle but denies any urinary symptoms.   Hip Pain       Home Medications Prior to Admission medications   Medication Sig Start Date End Date Taking? Authorizing Provider  cyclobenzaprine (FLEXERIL) 10 MG tablet Take 1 tablet (10 mg total) by mouth 2 (two) times daily as needed for muscle spasms. 04/02/23  Yes Al Decant, PA-C  acetaminophen (TYLENOL) 500 MG tablet Take 500 mg by mouth at bedtime.    [provider]   albuterol (PROVENTIL HFA;VENTOLIN HFA) 108 (90 Base) MCG/ACT inhaler Inhale 2 puffs into the lungs every 6 (six) hours as needed for wheezing. 01/30/17   Renne Musca, MD  Buprenorphine HCl-Naloxone HCl 4-1 MG FILM Place 1 Film under the tongue 4 (four) times daily.    [provider]  furosemide (LASIX) 40 MG tablet Take 1 tablet (40 mg total) by mouth daily. 02/13/22   Sabino Dick, DO  ibuprofen (ADVIL) 600 MG tablet Take 1 tablet (600 mg total) by mouth every 6 (six) hours as needed. 01/17/23   [name], MD  loratadine (CLARITIN) 10 MG tablet Take 10 mg by mouth at bedtime.    [provider]  methocarbamol (ROBAXIN) 500 MG tablet Take 1 tablet (500 mg total) by mouth 2 (two) times daily. 01/17/23   [name], MD  mometasone-formoterol (DULERA) 100-5 MCG/ACT AERO Inhale 2 puffs into the lungs 2 (two) times daily. 02/12/22   Sabino Dick, DO  nicotine (NICODERM CQ - DOSED IN MG/24 HOURS) 21 mg/24hr patch Place 1 patch (21 mg total) onto the skin daily. 02/13/22   Sabino Dick, DO  umeclidinium bromide (INCRUSE ELLIPTA) 62.5 MCG/ACT AEPB Inhale 1 puff into the lungs daily. 02/13/22   Sabino Dick, DO      Allergies    Bee venom and Clonidine derivatives    Review  of Systems   Review of Systems  Musculoskeletal:  Positive for arthralgias.  All other systems reviewed and are negative.   Physical Exam Updated Vital Signs BP (!) 143/87 (BP Location: Right Arm)   Pulse 80   Temp 98 F (36.7 C) (Oral)   Resp 18   Ht 5\' 11"  (1.803 m)   Wt 99.8 kg   SpO2 96%   BMI 30.68 kg/m  Physical Exam Vitals and nursing note reviewed.  Constitutional:      General: He is not in acute distress.    Appearance: Normal appearance. He is not ill-appearing, toxic-appearing or diaphoretic.  HENT:     Head: Normocephalic and atraumatic.     Nose: Nose normal.     Mouth/Throat:     Mouth: Mucous membranes are moist.     Pharynx: Oropharynx is  clear.  Eyes:     Extraocular Movements: Extraocular movements intact.     Conjunctiva/sclera: Conjunctivae normal.     Pupils: Pupils are equal, round, and reactive to light.  Cardiovascular:     Rate and Rhythm: Normal rate and regular rhythm.  Pulmonary:     Effort: Pulmonary effort is normal.     Breath sounds: Normal breath sounds. No wheezing.  Abdominal:     General: Abdomen is flat. Bowel sounds are normal.     Palpations: Abdomen is soft.     Tenderness: There is no abdominal tenderness.  Musculoskeletal:     Cervical back: Normal range of motion and neck supple. No tenderness.     Right hip: Tenderness present.     Left hip: Normal.     Comments: Tenderness to anterior hip, full range of motion actively and passively however somewhat limited by pain  Skin:    General: Skin is warm and dry.     Capillary Refill: Capillary refill takes less than 2 seconds.  Neurological:     Mental Status: He is alert and oriented to person, place, and time.     ED Results / Procedures / Treatments   Labs (all labs ordered are listed, but only abnormal results are displayed) Labs Reviewed - No data to display  EKG None  Radiology DG Hip Unilat  With Pelvis 2-3 Views Right  Result Date: 04/02/2023 CLINICAL DATA:  Hip pain EXAM: DG HIP (WITH OR WITHOUT PELVIS) 2-3V RIGHT COMPARISON:  01/17/2023 FINDINGS: Right SI joint is patent. No fracture or malalignment. Mild hip degenerative change. IMPRESSION: Mild degenerative change. Electronically Signed   By: Jasmine Pang M.D.   On: 04/02/2023 17:27    Procedures Procedures   Medications Ordered in ED Medications  lidocaine (LIDODERM) 5 % 1 patch (1 patch Transdermal Patch Applied 04/02/23 1826)  ketorolac (TORADOL) 15 MG/ML injection 15 mg (15 mg Intramuscular Given 04/02/23 1826)  cyclobenzaprine (FLEXERIL) tablet 10 mg (10 mg Oral Given 04/02/23 1826)    ED Course/ Medical Decision Making/ A&P  Medical Decision Making Amount  and/or Complexity of Data Reviewed Radiology: ordered.  Risk Prescription drug management.   57 year old male presents to the ED for evaluation.  Please see HPI for further details.  On my examination patient is afebrile and nontachycardic.  His lung sounds are clear bilaterally and he is not hypoxic.  His abdomen is soft and compressible throughout.  His right hip does have anterior tenderness however full range of motion actively and passively.  Neurological examination at baseline.  Plan, imaging the patient right hip shows mild hip degenerative change, I agree.  No evidence of acute fracture pathology.  Patient provided Flexeril, Toradol, Lidoderm patch and reports that his pain has greatly reduced at this time.  He will be discharged and he will follow-up with his PCP.  Will also refer him over to orthopedics for further management.  Will send patient home with muscle relaxer and ibuprofen.  He was advised to follow-up with his PCP.  He voiced understanding.  He had all of his questions answered to his satisfaction.  He is stable to discharge home at this time.   Final Clinical Impression(s) / ED Diagnoses Final diagnoses:  Right hip pain    Rx / DC Orders ED Discharge Orders          Ordered    cyclobenzaprine (FLEXERIL) 10 MG tablet  2 times daily PRN        04/02/23 1902              Clent Ridges 04/02/23 Therisa Doyne, MD 04/05/23 [phone]

## 2023-04-02 NOTE — ED Notes (Signed)
Pt not located in room at time for discharge.   Staff state pt walked out of department prior to discharge teaching. Reported no signs of distress.

## 2023-04-02 NOTE — Discharge Instructions (Addendum)
Return to the ED with any new or worsening signs or symptoms such as fevers Please continue taking ibuprofen at home along with Tylenol.  You may also take muscle relaxer I prescribed you.  Please do not drive or operate heavy machinery under the influence this medication.  He may also purchase Salonpas patches which are over-the-counter Please follow-up with Dr. Steward Drone of orthopedics for further management. You may also follow-up with your PCP for further management and reevaluation

## 2023-07-12 NOTE — Progress Notes (Signed)
Surgery orders requested via Epic inbox. °

## 2023-07-18 NOTE — Patient Instructions (Signed)
DUE TO COVID-19 ONLY TWO VISITORS  (aged 57 and older)  ARE ALLOWED TO COME WITH YOU AND STAY IN THE WAITING ROOM ONLY DURING PRE OP AND PROCEDURE.   **NO VISITORS ARE ALLOWED IN THE SHORT STAY AREA OR RECOVERY ROOM!!**  IF YOU WILL BE ADMITTED INTO THE HOSPITAL YOU ARE ALLOWED ONLY FOUR SUPPORT PEOPLE DURING VISITATION HOURS ONLY (7 AM -8PM)   The support person(s) must pass our screening, gel in and out, and wear a mask at all times, including in the patient's room. Patients must also wear a mask when staff or their support person are in the room. Visitors GUEST BADGE MUST BE WORN VISIBLY  One adult visitor may remain with you overnight and MUST be in the room by 8 P.M.     Your procedure is scheduled on: 08/01/23   Report to [facility] Main Entrance    Report to admitting at : 5:15 AM   Call this number if you have problems the morning of surgery [phone]   Do not eat food :After Midnight.   After Midnight you may have the following liquids until : 4:30 AM DAY OF SURGERY  Water Black Coffee (sugar ok, NO MILK/CREAM OR CREAMERS)  Tea (sugar ok, NO MILK/CREAM OR CREAMERS) regular and decaf                             Plain Jell-O (NO RED)                                           Fruit ices (not with fruit pulp, NO RED)                                     Popsicles (NO RED)                                                                  Juice: apple, WHITE grape, WHITE cranberry Sports drinks like Gatorade (NO RED)   The day of surgery:  Drink ONE (1) Pre-Surgery Clear Ensure at : 4:30 AM the morning of surgery. Drink in one sitting. Do not sip.  This drink was given to you during your hospital  pre-op appointment visit. Nothing else to drink after completing the  Pre-Surgery Clear Ensure or G2.          If you have questions, please contact your surgeon's office.  FOLLOW AND ANY ADDITIONAL PRE OP INSTRUCTIONS YOU RECEIVED FROM YOUR SURGEON'S OFFICE!!!   Oral  Hygiene is also important to reduce your risk of infection.                                    Remember - BRUSH YOUR TEETH THE MORNING OF SURGERY WITH YOUR REGULAR TOOTHPASTE  DENTURES WILL BE REMOVED PRIOR TO SURGERY PLEASE DO NOT APPLY "Poly grip" OR ADHESIVES!!!   Do NOT smoke after Midnight   Take these medicines the morning of  surgery with A SIP OF WATER: None. Use inhalers as usual. Tylenol as needed.                              You may not have any metal on your body including hair pins, jewelry, and body piercing             Do not wear lotions, powders, perfumes/cologne, or deodorant              Men may shave face and neck.   Do not bring valuables to the hospital. [facility] IS NOT             RESPONSIBLE   FOR VALUABLES.   Contacts, glasses, or bridgework may not be worn into surgery.   Bring small overnight bag day of surgery.   DO NOT BRING YOUR HOME MEDICATIONS TO THE HOSPITAL. PHARMACY WILL DISPENSE MEDICATIONS LISTED ON YOUR MEDICATION LIST TO YOU DURING YOUR ADMISSION IN THE HOSPITAL!    Patients discharged on the day of surgery will not be allowed to drive home.  Someone NEEDS to stay with you for the first 24 hours after anesthesia.   Special Instructions: Bring a copy of your healthcare power of attorney and living will documents         the day of surgery if you haven't scanned them before.              Please read over the following fact sheets you were given: IF YOU HAVE QUESTIONS ABOUT YOUR PRE-OP INSTRUCTIONS PLEASE CALL [phone]      Pre-operative 5 CHG Bath Instructions   You can play a key role in reducing the risk of infection after surgery. Your skin needs to be as free of germs as possible. You can reduce the number of germs on your skin by washing with CHG (chlorhexidine gluconate) soap before surgery. CHG is an antiseptic soap that kills germs and continues to kill germs even after washing.   DO NOT use if you have an allergy to  chlorhexidine/CHG or antibacterial soaps. If your skin becomes reddened or irritated, stop using the CHG and notify one of our RNs at : [phone].   Please shower with the CHG soap starting 4 days before surgery using the following schedule:     Please keep in mind the following:  DO NOT shave, including legs and underarms, starting the day of your first shower.   You may shave your face at any point before/day of surgery.  Place clean sheets on your bed the day you start using CHG soap. Use a clean washcloth (not used since being washed) for each shower. DO NOT sleep with pets once you start using the CHG.   CHG Shower Instructions:  If you choose to wash your hair and private area, wash first with your normal shampoo/soap.  After you use shampoo/soap, rinse your hair and body thoroughly to remove shampoo/soap residue.  Turn the water OFF and apply about 3 tablespoons (45 ml) of CHG soap to a CLEAN washcloth.  Apply CHG soap ONLY FROM YOUR NECK DOWN TO YOUR TOES (washing for 3-5 minutes)  DO NOT use CHG soap on face, private areas, open wounds, or sores.  Pay special attention to the area where your surgery is being performed.  If you are having back surgery, having someone wash your back for you may be helpful. Wait 2 minutes after CHG  soap is applied, then you may rinse off the CHG soap.  Pat dry with a clean towel  Put on clean clothes/pajamas   If you choose to wear lotion, please use ONLY the CHG-compatible lotions on the back of this paper.     Additional instructions for the day of surgery: DO NOT APPLY any lotions, deodorants, cologne, or perfumes.   Put on clean/comfortable clothes.  Brush your teeth.  Ask your nurse before applying any prescription medications to the skin.   CHG Compatible Lotions   Aveeno Moisturizing lotion  Cetaphil Moisturizing Cream  Cetaphil Moisturizing Lotion  Clairol Herbal Essence Moisturizing Lotion, Dry Skin  Clairol Herbal Essence  Moisturizing Lotion, Extra Dry Skin  Clairol Herbal Essence Moisturizing Lotion, Normal Skin  Curel Age Defying Therapeutic Moisturizing Lotion with Alpha Hydroxy  Curel Extreme Care Body Lotion  Curel Soothing Hands Moisturizing Hand Lotion  Curel Therapeutic Moisturizing Cream, Fragrance-Free  Curel Therapeutic Moisturizing Lotion, Fragrance-Free  Curel Therapeutic Moisturizing Lotion, Original Formula  Eucerin Daily Replenishing Lotion  Eucerin Dry Skin Therapy Plus Alpha Hydroxy Crme  Eucerin Dry Skin Therapy Plus Alpha Hydroxy Lotion  Eucerin Original Crme  Eucerin Original Lotion  Eucerin Plus Crme Eucerin Plus Lotion  Eucerin TriLipid Replenishing Lotion  Keri Anti-Bacterial Hand Lotion  Keri Deep Conditioning Original Lotion Dry Skin Formula Softly Scented  Keri Deep Conditioning Original Lotion, Fragrance Free Sensitive Skin Formula  Keri Lotion Fast Absorbing Fragrance Free Sensitive Skin Formula  Keri Lotion Fast Absorbing Softly Scented Dry Skin Formula  Keri Original Lotion  Keri Skin Renewal Lotion Keri Silky Smooth Lotion  Keri Silky Smooth Sensitive Skin Lotion  Nivea Body Creamy Conditioning Oil  Nivea Body Extra Enriched Teacher, adult education Moisturizing Lotion Nivea Crme  Nivea Skin Firming Lotion  NutraDerm 30 Skin Lotion  NutraDerm Skin Lotion  NutraDerm Therapeutic Skin Cream  NutraDerm Therapeutic Skin Lotion  ProShield Protective Hand Cream  Provon moisturizing lotion

## 2023-07-19 ENCOUNTER — Encounter (HOSPITAL_COMMUNITY)
Admission: RE | Admit: 2023-07-19 | Discharge: 2023-07-19 | Disposition: A | Discharge status: Home / Self Care | Payer: Medicaid Other | Source: Ambulatory Visit | Attending: Orthopedic Surgery | Admitting: Orthopedic Surgery

## 2023-07-19 ENCOUNTER — Other Ambulatory Visit: Payer: Self-pay

## 2023-07-19 ENCOUNTER — Encounter (HOSPITAL_COMMUNITY): Payer: Self-pay

## 2023-07-19 VITALS — BP 138/99 | HR 68 | Temp 98.4°F | Ht 71.0 in | Wt 218.0 lb

## 2023-07-19 DIAGNOSIS — Z01812 Encounter for preprocedural laboratory examination: Secondary | ICD-10-CM | POA: Diagnosis present

## 2023-07-19 DIAGNOSIS — I10 Essential (primary) hypertension: Secondary | ICD-10-CM | POA: Diagnosis not present

## 2023-07-19 DIAGNOSIS — Z01810 Encounter for preprocedural cardiovascular examination: Secondary | ICD-10-CM | POA: Diagnosis present

## 2023-07-19 DIAGNOSIS — Z01818 Encounter for other preprocedural examination: Secondary | ICD-10-CM | POA: Insufficient documentation

## 2023-07-19 LAB — TYPE AND SCREEN
ABO/RH(D): A POS
Antibody Screen: NEGATIVE

## 2023-07-19 LAB — BASIC METABOLIC PANEL
Anion gap: 10 (ref 5–15)
BUN: 8 mg/dL (ref 6–20)
CO2: 29 mmol/L (ref 22–32)
Calcium: 8.9 mg/dL (ref 8.9–10.3)
Chloride: 99 mmol/L (ref 98–111)
Creatinine, Ser: 0.56 mg/dL — ABNORMAL LOW (ref 0.61–1.24)
GFR, Estimated: >60 mL/min (ref >60)
Glucose, Bld: 92 mg/dL (ref 70–99)
Potassium: 3.4 mmol/L — ABNORMAL LOW (ref 3.5–5.1)
Sodium: 138 mmol/L (ref 135–145)

## 2023-07-19 LAB — CBC
HCT: 44 % (ref 39.0–52.0)
Hemoglobin: 14.2 g/dL (ref 13.0–17.0)
MCH: 31.1 pg (ref 26.0–34.0)
MCHC: 32.3 g/dL (ref 30.0–36.0)
MCV: 96.3 fL (ref 80.0–100.0)
Platelets: 243 10*3/uL (ref 150–400)
RBC: 4.57 MIL/uL (ref 4.22–5.81)
RDW: 12.9 % (ref 11.5–15.5)
WBC: 7.1 10*3/uL (ref 4.0–10.5)
nRBC: 0 % (ref 0.0–0.2)

## 2023-07-19 LAB — SURGICAL PCR SCREEN
MRSA, PCR: NEGATIVE
Staphylococcus aureus: NEGATIVE

## 2023-07-19 NOTE — Progress Notes (Addendum)
For Short Stay: COVID SWAB appointment date:  Bowel Prep reminder:   For Anesthesia: PCP - [facility] .Agustin Cree, NP   Cardiologist - N/A  Chest x-ray -  EKG - 07/19/23 Stress Test -  ECHO - 02/05/22 Cardiac Cath -  Pacemaker/ICD device last checked: Pacemaker orders received: Device Rep notified:  Spinal Cord Stimulator: N/A  Sleep Study - N/A CPAP -   Fasting Blood Sugar - N/A Checks Blood Sugar _____ times a day Date and result of last Hgb A1c-  Last dose of GLP1 agonist- N/A GLP1 instructions:   Last dose of SGLT-2 inhibitors- N/A SGLT-2 instructions:   Blood Thinner Instructions: N/A Aspirin Instructions: Last Dose:  Activity level: Can go up a flight of stairs and activities of daily living without stopping and without chest pain and/or shortness of breath   Able to exercise without chest pain and/or shortness of breath  Anesthesia review: Hx: HTN,COPD,Smoker  Patient denies shortness of breath, fever, cough and chest pain at PAT appointment   Patient verbalized understanding of instructions that were given to them at the PAT appointment. Patient was also instructed that they will need to review over the PAT instructions again at home before surgery.

## 2023-07-21 NOTE — Plan of Care (Signed)
CHL Tonsillectomy/Adenoidectomy, Postoperative PEDS care plan entered in error.

## 2023-07-31 ENCOUNTER — Encounter (HOSPITAL_COMMUNITY): Payer: Self-pay | Admitting: Anesthesiology
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
## 2023-07-31 NOTE — Anesthesia Preprocedure Evaluation (Signed)
Anesthesia Evaluation    Reviewed: Allergy & Precautions, Patient's Chart, lab work & pertinent test results  History of Anesthesia Complications (+) PONV and history of anesthetic complications  Airway        Dental   Pulmonary COPD,  COPD inhaler, Current Smoker          Cardiovascular hypertension, pulmonary hypertension+CHF    TTE 2023 1. Left ventricular ejection fraction, by estimation, is >75%. The left  ventricle has hyperdynamic function. The left ventricle has no regional  wall motion abnormalities. There is mild left ventricular hypertrophy.  Left ventricular diastolic parameters  are consistent with Grade I diastolic dysfunction (impaired relaxation).  There is the interventricular septum is flattened in systole and diastole,  consistent with right ventricular pressure and volume overload.   2. Right ventricular systolic function is severely reduced. The right  ventricular size is severely enlarged. There is moderately elevated  pulmonary artery systolic pressure.   3. Right atrial size was severely dilated.   4. A small pericardial effusion is present.   5. The mitral valve is normal in structure. No evidence of mitral valve  regurgitation. No evidence of mitral stenosis.   6. The aortic valve is tricuspid. Aortic valve regurgitation is not  visualized. Aortic valve sclerosis/calcification is present, without any  evidence of aortic stenosis.   7. Aortic dilatation noted. There is mild dilatation of the ascending  aorta, measuring 44 mm.   8. The inferior vena cava is dilated in size with <50% respiratory  variability, suggesting right atrial pressure of 15 mmHg.     Neuro/Psych  PSYCHIATRIC DISORDERS Anxiety Depression    negative neurological ROS     GI/Hepatic PUD,,,(+)     substance abuse  alcohol use  Endo/Other    Renal/GU negative Renal ROS  negative genitourinary   Musculoskeletal  (+)  Arthritis ,  narcotic dependent  Abdominal   Peds  Hematology negative hematology ROS (+)   Anesthesia Other Findings   Reproductive/Obstetrics                             Anesthesia Physical Anesthesia Plan  ASA: 3  Anesthesia Plan: Spinal   Post-op Pain Management: Tylenol PO (pre-op)*   Induction:   PONV Risk Score and Plan: 2 and Treatment may vary due to age or medical condition, Midazolam, Propofol infusion, Dexamethasone and Ondansetron  Airway Management Planned: Natural Airway  Additional Equipment:   Intra-op Plan:   Post-operative Plan:   Informed Consent: I have reviewed the patients History and Physical, chart, labs and discussed the procedure including the risks, benefits and alternatives for the proposed anesthesia with the patient or authorized representative who has indicated his/her understanding and acceptance.     Dental advisory given  Plan Discussed with: CRNA  Anesthesia Plan Comments: (Case cancelled)       Anesthesia Quick Evaluation

## 2023-08-01 ENCOUNTER — Encounter (HOSPITAL_COMMUNITY)
Admission: RE | Disposition: A | Discharge status: Home / Self Care | Payer: Self-pay | Source: Ambulatory Visit | Attending: Orthopedic Surgery

## 2023-08-01 ENCOUNTER — Ambulatory Visit (HOSPITAL_COMMUNITY)
Admission: RE | Admit: 2023-08-01 | Discharge: 2023-08-01 | Disposition: A | Discharge status: Home / Self Care | Payer: Medicaid Other | Source: Ambulatory Visit | Attending: Orthopedic Surgery | Admitting: Orthopedic Surgery

## 2023-08-01 ENCOUNTER — Other Ambulatory Visit: Payer: Self-pay

## 2023-08-01 ENCOUNTER — Encounter (HOSPITAL_COMMUNITY): Payer: Self-pay | Admitting: Orthopedic Surgery

## 2023-08-01 SURGERY — TOTAL HIP ARTHROPLASTY
Anesthesia: Spinal | Site: Hip | Laterality: Right

## 2023-08-01 MED ORDER — POVIDONE-IODINE 10 % EX SWAB: 2.0000 | Freq: Once | CUTANEOUS | Status: DC

## 2023-08-01 MED ORDER — TRANEXAMIC ACID-NACL 1000-0.7 MG/100ML-% IV SOLN: 1000.0000 mg | INTRAVENOUS | Status: DC

## 2023-08-01 MED ORDER — ORAL CARE MOUTH RINSE: 15.0000 mL | Freq: Once | OROMUCOSAL | Status: DC

## 2023-08-01 MED ORDER — LACTATED RINGERS IV SOLN: INTRAVENOUS | Status: DC

## 2023-08-01 MED ORDER — CEFAZOLIN SODIUM-DEXTROSE 2-4 GM/100ML-% IV SOLN: 2.0000 g | INTRAVENOUS | Status: DC

## 2023-08-01 MED ORDER — POVIDONE-IODINE 7.5 % EX SOLN: Freq: Once | CUTANEOUS | Status: DC

## 2023-08-01 MED ORDER — ACETAMINOPHEN 500 MG PO TABS: 1000.0000 mg | ORAL_TABLET | Freq: Once | ORAL | Status: DC

## 2023-08-01 MED ORDER — CHLORHEXIDINE GLUCONATE 0.12 % MT SOLN: 15.0000 mL | Freq: Once | OROMUCOSAL | Status: DC

## 2023-08-01 NOTE — Progress Notes (Signed)
Dr Dion Saucier is aware that patient left facility.

## 2023-08-01 NOTE — OR Nursing (Signed)
Patient arrived to short stay requesting pain medication. Was told that we would have to get him ready first, and he would have to speak with anesthesia. Patient got very agitated and stated he did not want to have surgery. Charge RN went to talk with pt and patient did not want to talk to RN, stated he was leaving and not having surgery. Patient left hospital.

## 2024-06-09 ENCOUNTER — Other Ambulatory Visit: Payer: Self-pay

## 2024-06-09 ENCOUNTER — Emergency Department (HOSPITAL_COMMUNITY)

## 2024-06-09 ENCOUNTER — Encounter (HOSPITAL_COMMUNITY): Payer: Self-pay | Admitting: *Deleted

## 2024-06-09 ENCOUNTER — Emergency Department (HOSPITAL_COMMUNITY)
Admission: EM | Admit: 2024-06-09 | Discharge: 2024-06-09 | Disposition: A | Discharge status: Home / Self Care | Attending: Emergency Medicine | Admitting: Emergency Medicine

## 2024-06-09 DIAGNOSIS — M79672 Pain in left foot: Secondary | ICD-10-CM | POA: Diagnosis present

## 2024-06-09 DIAGNOSIS — Z96642 Presence of left artificial hip joint: Secondary | ICD-10-CM | POA: Diagnosis not present

## 2024-06-09 DIAGNOSIS — G6289 Other specified polyneuropathies: Secondary | ICD-10-CM | POA: Insufficient documentation

## 2024-06-09 DIAGNOSIS — I110 Hypertensive heart disease with heart failure: Secondary | ICD-10-CM | POA: Diagnosis not present

## 2024-06-09 DIAGNOSIS — M79671 Pain in right foot: Secondary | ICD-10-CM

## 2024-06-09 DIAGNOSIS — I5030 Unspecified diastolic (congestive) heart failure: Secondary | ICD-10-CM | POA: Diagnosis not present

## 2024-06-09 DIAGNOSIS — F17200 Nicotine dependence, unspecified, uncomplicated: Secondary | ICD-10-CM | POA: Diagnosis not present

## 2024-06-09 DIAGNOSIS — J449 Chronic obstructive pulmonary disease, unspecified: Secondary | ICD-10-CM | POA: Insufficient documentation

## 2024-06-09 LAB — COMPREHENSIVE METABOLIC PANEL WITH GFR
ALT: 10 U/L (ref 0–44)
AST: 16 U/L (ref 15–41)
Albumin: 3.6 g/dL (ref 3.5–5.0)
Alkaline Phosphatase: 75 U/L (ref 38–126)
Anion gap: 11 (ref 5–15)
BUN: 7 mg/dL (ref 6–20)
CO2: 27 mmol/L (ref 22–32)
Calcium: 8.8 mg/dL — ABNORMAL LOW (ref 8.9–10.3)
Chloride: 99 mmol/L (ref 98–111)
Creatinine, Ser: 0.52 mg/dL — ABNORMAL LOW (ref 0.61–1.24)
GFR, Estimated: >60 mL/min (ref >60)
Glucose, Bld: 109 mg/dL — ABNORMAL HIGH (ref 70–99)
Potassium: 3.4 mmol/L — ABNORMAL LOW (ref 3.5–5.1)
Sodium: 137 mmol/L (ref 135–145)
Total Bilirubin: 0.6 mg/dL (ref 0.0–1.2)
Total Protein: 6.6 g/dL (ref 6.5–8.1)

## 2024-06-09 LAB — URINALYSIS, ROUTINE W REFLEX MICROSCOPIC
Bilirubin Urine: NEGATIVE
Glucose, UA: NEGATIVE mg/dL
Hgb urine dipstick: NEGATIVE
Ketones, ur: NEGATIVE mg/dL
Leukocytes,Ua: NEGATIVE
Nitrite: NEGATIVE
Protein, ur: NEGATIVE mg/dL
Specific Gravity, Urine: 1.012 (ref 1.005–1.030)
pH: 5 (ref 5.0–8.0)

## 2024-06-09 LAB — I-STAT CHEM 8, ED
BUN: 10 mg/dL (ref 6–20)
Calcium, Ion: 1.13 mmol/L — ABNORMAL LOW (ref 1.15–1.40)
Chloride: 99 mmol/L (ref 98–111)
Creatinine, Ser: 0.7 mg/dL (ref 0.61–1.24)
Glucose, Bld: 112 mg/dL — ABNORMAL HIGH (ref 70–99)
HCT: 49 % (ref 39.0–52.0)
Hemoglobin: 16.7 g/dL (ref 13.0–17.0)
Potassium: 3.4 mmol/L — ABNORMAL LOW (ref 3.5–5.1)
Sodium: 140 mmol/L (ref 135–145)
TCO2: 28 mmol/L (ref 22–32)

## 2024-06-09 LAB — CBC
HCT: 46.8 % (ref 39.0–52.0)
Hemoglobin: 15 g/dL (ref 13.0–17.0)
MCH: 30.3 pg (ref 26.0–34.0)
MCHC: 32.1 g/dL (ref 30.0–36.0)
MCV: 94.5 fL (ref 80.0–100.0)
Platelets: 215 K/uL (ref 150–400)
RBC: 4.95 MIL/uL (ref 4.22–5.81)
RDW: 12.9 % (ref 11.5–15.5)
WBC: 6.2 K/uL (ref 4.0–10.5)
nRBC: 0 % (ref 0.0–0.2)

## 2024-06-09 MED ORDER — GABAPENTIN 300 MG PO CAPS: 300.0000 mg | ORAL_CAPSULE | Freq: Two times a day (BID) | ORAL | 2 refills | Status: AC

## 2024-06-09 NOTE — ED Triage Notes (Signed)
 The pt injured his rt leg 3-4 days ago and he has pain in his rt great toe and he has an abrasion to his rt middle tib fib that appears red and irritated.  He is also c/o pain in his lt foot from starting his scotter  feeling weak also

## 2024-06-09 NOTE — Discharge Instructions (Signed)
 You were seen for your leg pain in the emergency department.  You did not have any broken bones.  Your symptoms are likely from neuropathy.  At home, please start the gabapentin  twice a day and talk to your primary doctor in 1 week for a follow-up.  They may need to increase the dose gradually.    Return immediately to the emergency department if you experience any of the following: Worsening pain, coolness of your feet, or any other concerning symptoms.    Thank you for visiting our Emergency Department. It was a pleasure taking care of you today.

## 2024-06-09 NOTE — ED Triage Notes (Signed)
 Pt here from home with c/o bil foot pain , thinks he may have fell and hurt them
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
## 2024-08-24 ENCOUNTER — Other Ambulatory Visit: Payer: Self-pay

## 2024-08-24 ENCOUNTER — Emergency Department (HOSPITAL_BASED_OUTPATIENT_CLINIC_OR_DEPARTMENT_OTHER)

## 2024-08-24 ENCOUNTER — Emergency Department (HOSPITAL_BASED_OUTPATIENT_CLINIC_OR_DEPARTMENT_OTHER)
Admission: EM | Admit: 2024-08-24 | Discharge: 2024-08-24 | Discharge status: Against Medical Advice | Attending: Emergency Medicine | Admitting: Emergency Medicine

## 2024-08-24 ENCOUNTER — Emergency Department (HOSPITAL_BASED_OUTPATIENT_CLINIC_OR_DEPARTMENT_OTHER): Admitting: Radiology

## 2024-08-24 ENCOUNTER — Encounter (HOSPITAL_BASED_OUTPATIENT_CLINIC_OR_DEPARTMENT_OTHER): Payer: Self-pay

## 2024-08-24 DIAGNOSIS — R0902 Hypoxemia: Secondary | ICD-10-CM | POA: Insufficient documentation

## 2024-08-24 DIAGNOSIS — E8779 Other fluid overload: Secondary | ICD-10-CM | POA: Diagnosis not present

## 2024-08-24 DIAGNOSIS — I509 Heart failure, unspecified: Secondary | ICD-10-CM | POA: Diagnosis not present

## 2024-08-24 DIAGNOSIS — R0602 Shortness of breath: Secondary | ICD-10-CM | POA: Diagnosis present

## 2024-08-24 DIAGNOSIS — I724 Aneurysm of artery of lower extremity: Secondary | ICD-10-CM | POA: Insufficient documentation

## 2024-08-24 DIAGNOSIS — Z5329 Procedure and treatment not carried out because of patient's decision for other reasons: Secondary | ICD-10-CM | POA: Diagnosis not present

## 2024-08-24 DIAGNOSIS — J441 Chronic obstructive pulmonary disease with (acute) exacerbation: Secondary | ICD-10-CM | POA: Insufficient documentation

## 2024-08-24 DIAGNOSIS — F17200 Nicotine dependence, unspecified, uncomplicated: Secondary | ICD-10-CM | POA: Insufficient documentation

## 2024-08-24 LAB — BASIC METABOLIC PANEL WITH GFR
Anion gap: 8 (ref 5–15)
BUN: 7 mg/dL (ref 6–20)
CO2: 33 mmol/L — ABNORMAL HIGH (ref 22–32)
Calcium: 9.4 mg/dL (ref 8.9–10.3)
Chloride: 99 mmol/L (ref 98–111)
Creatinine, Ser: 0.6 mg/dL — ABNORMAL LOW (ref 0.61–1.24)
GFR, Estimated: >60 mL/min (ref >60)
Glucose, Bld: 93 mg/dL (ref 70–99)
Potassium: 3.6 mmol/L (ref 3.5–5.1)
Sodium: 140 mmol/L (ref 135–145)

## 2024-08-24 LAB — RESP PANEL BY RT-PCR (RSV, FLU A&B, COVID)  RVPGX2
Influenza A by PCR: NEGATIVE
Influenza B by PCR: NEGATIVE
Resp Syncytial Virus by PCR: NEGATIVE
SARS Coronavirus 2 by RT PCR: NEGATIVE

## 2024-08-24 LAB — CBC
HCT: 47.6 % (ref 39.0–52.0)
Hemoglobin: 15.3 g/dL (ref 13.0–17.0)
MCH: 30.1 pg (ref 26.0–34.0)
MCHC: 32.1 g/dL (ref 30.0–36.0)
MCV: 93.7 fL (ref 80.0–100.0)
Platelets: 204 K/uL (ref 150–400)
RBC: 5.08 MIL/uL (ref 4.22–5.81)
RDW: 13.2 % (ref 11.5–15.5)
WBC: 6.6 K/uL (ref 4.0–10.5)
nRBC: 0 % (ref 0.0–0.2)

## 2024-08-24 LAB — PRO BRAIN NATRIURETIC PEPTIDE: Pro Brain Natriuretic Peptide: <50 pg/mL (ref <300.0)

## 2024-08-24 LAB — TROPONIN T, HIGH SENSITIVITY
Troponin T High Sensitivity: <15 ng/L (ref 0–19)
Troponin T High Sensitivity: <15 ng/L (ref 0–19)

## 2024-08-24 MED ORDER — ALBUTEROL SULFATE HFA 108 (90 BASE) MCG/ACT IN AERS: 1.0000 | INHALATION_SPRAY | Freq: Four times a day (QID) | RESPIRATORY_TRACT | 0 refills | Status: AC | PRN

## 2024-08-24 MED ORDER — PREDNISONE 50 MG PO TABS: 50.0000 mg | ORAL_TABLET | Freq: Every day | ORAL | 0 refills | Status: AC

## 2024-08-24 MED ORDER — FUROSEMIDE 20 MG PO TABS: 20.0000 mg | ORAL_TABLET | Freq: Every day | ORAL | 0 refills | Status: AC

## 2024-08-24 MED ORDER — IPRATROPIUM-ALBUTEROL 0.5-2.5 (3) MG/3ML IN SOLN
3.0000 mL | Freq: Once | RESPIRATORY_TRACT | Status: AC
  Administered 2024-08-24: 3 mL via RESPIRATORY_TRACT
  Filled 2024-08-24: qty 3

## 2024-08-24 MED ORDER — FUROSEMIDE 10 MG/ML IJ SOLN
20.0000 mg | Freq: Once | INTRAMUSCULAR | Status: AC
  Administered 2024-08-24: 20 mg via INTRAVENOUS
  Filled 2024-08-24: qty 2

## 2024-08-24 MED ORDER — IOHEXOL 350 MG/ML SOLN
75.0000 mL | Freq: Once | INTRAVENOUS | Status: AC | PRN
  Administered 2024-08-24: 75 mL via INTRAVENOUS

## 2024-08-24 MED ORDER — IPRATROPIUM-ALBUTEROL 0.5-2.5 (3) MG/3ML IN SOLN: 3.0000 mL | RESPIRATORY_TRACT | 0 refills | Status: AC | PRN

## 2024-08-24 MED ORDER — METHYLPREDNISOLONE SODIUM SUCC 125 MG IJ SOLR
125.0000 mg | Freq: Once | INTRAMUSCULAR | Status: AC
  Administered 2024-08-24: 125 mg via INTRAVENOUS
  Filled 2024-08-24: qty 2

## 2024-08-24 NOTE — ED Provider Notes (Signed)
 [facility] EMERGENCY DEPARTMENT AT [facility] Provider Note   CSN: [id number] Arrival date & time: 08/24/24  1710     Patient presents with: Cough, Leg Swelling, and Shortness of Breath   Trevor Thomas is a 58 y.o. male for evaluation of cough.  States over the last few days he has had cough productive of green sputum, shortness of breath, wheezing.  Has noted some pain to his right leg.  He had an injury in September.  He has had some pain to his anterior shin.  He also noted some new bilateral lower extremity swelling, right greater than left.  No history of PE or DVT.  Tried using his albuterol  inhaler at home once without relief.  He does state he has a history of CHF for many years ago does not take diuretics.  He still smokes tobacco.  No chest pain, fever.  Feels generally weak and fatigued however no unilateral weakness.  Denies any redness or warmth to the leg.  Admission in 2023 for similar requiring intubation.  Discharged home on 40 mg p.o. Lasix  which patient did not follow-up with cardiology and has not been taking.   HPI     Prior to Admission medications   Medication Sig Start Date End Date Taking? Authorizing Provider  albuterol  (VENTOLIN  HFA) 108 (90 Base) MCG/ACT inhaler Inhale 1-2 puffs into the lungs every 6 (six) hours as needed for wheezing or shortness of breath. 08/24/24  Yes Eilee Schader A, PA-C  furosemide  (LASIX ) 20 MG tablet Take 1 tablet (20 mg total) by mouth daily. 08/24/24  Yes Keelin Neville A, PA-C  ipratropium-albuterol  (DUONEB) 0.5-2.5 (3) MG/3ML SOLN Take 3 mLs by nebulization every 2 (two) hours as needed. 08/24/24  Yes Jesselle Laflamme A, PA-C  predniSONE  (DELTASONE ) 50 MG tablet Take 1 tablet (50 mg total) by mouth daily for 5 days. 08/24/24 08/29/24 Yes Zykeria Laguardia A, PA-C  acetaminophen  (TYLENOL ) 500 MG tablet Take 1,000 mg by mouth in the morning and at bedtime.    [provider]  albuterol  (PROVENTIL  HFA;VENTOLIN   HFA) 108 (90 Base) MCG/ACT inhaler Inhale 2 puffs into the lungs every 6 (six) hours as needed for wheezing. 01/30/17   Rennie Toribio CROME, MD  Buprenorphine  HCl-Naloxone  HCl 8-2 MG FILM Place 1 Film under the tongue in the morning and at bedtime.    [provider]  cyclobenzaprine  (FLEXERIL ) 10 MG tablet Take 1 tablet (10 mg total) by mouth 2 (two) times daily as needed for muscle spasms. Patient not taking: Reported on 07/06/2023 04/02/23   Ruthell Lonni FALCON, PA-C  furosemide  (LASIX ) 40 MG tablet Take 1 tablet (40 mg total) by mouth daily. Patient not taking: Reported on 07/06/2023 02/13/22   Espinoza, Alejandra, DO  gabapentin  (NEURONTIN ) 300 MG capsule Take 1 capsule (300 mg total) by mouth 2 (two) times daily. 06/09/24   Yolande Lamar BROCKS, MD  ibuprofen  (ADVIL ) 600 MG tablet Take 1 tablet (600 mg total) by mouth every 6 (six) hours as needed. Patient not taking: Reported on 07/06/2023 01/17/23   Charlyn Sora, MD  methocarbamol  (ROBAXIN ) 500 MG tablet Take 1 tablet (500 mg total) by mouth 2 (two) times daily. Patient not taking: Reported on 07/06/2023 01/17/23   Nanavati, Ankit, MD  mometasone -formoterol  (DULERA ) 100-5 MCG/ACT AERO Inhale 2 puffs into the lungs 2 (two) times daily. Patient not taking: Reported on 07/06/2023 02/12/22   Espinoza, Alejandra, DO  nicotine  (NICODERM CQ  - DOSED IN MG/24 HOURS) 21 mg/24hr patch Place 1 patch (  21 mg total) onto the skin daily. Patient not taking: Reported on 07/06/2023 02/13/22   Espinoza, Alejandra, DO  umeclidinium bromide  (INCRUSE ELLIPTA ) 62.5 MCG/ACT AEPB Inhale 1 puff into the lungs daily. Patient not taking: Reported on 07/06/2023 02/13/22   Espinoza, Alejandra, DO    Allergies: Bee venom and Clonidine  derivatives    Review of Systems  Constitutional:  Positive for fatigue.  HENT: Negative.    Respiratory:  Positive for cough, shortness of breath and wheezing.   Cardiovascular:  Positive for leg swelling.  Gastrointestinal: Negative.    Genitourinary: Negative.   Musculoskeletal: Negative.   Skin: Negative.   Neurological:  Positive for weakness (generalized weakness).  All other systems reviewed and are negative.   Updated Vital Signs BP (!) 134/91 (BP Location: Right Arm)   Pulse 79   Temp 98.2 F (36.8 C) (Oral)   Resp 18   Ht 5' 11 (1.803 m)   Wt 111.1 kg   SpO2 92%   BMI 34.17 kg/m   Physical Exam Vitals and nursing note reviewed.  Constitutional:      General: He is not in acute distress.    Appearance: He is well-developed. He is obese. He is not ill-appearing, toxic-appearing or diaphoretic.  HENT:     Head: Normocephalic and atraumatic.  Eyes:     Pupils: Pupils are equal, round, and reactive to light.  Cardiovascular:     Rate and Rhythm: Normal rate and regular rhythm.     Pulses: Normal pulses.          Radial pulses are 2+ on the right side and 2+ on the left side.       Dorsalis pedis pulses are 2+ on the right side and 2+ on the left side.     Heart sounds: Normal heart sounds.  Pulmonary:     Effort: Pulmonary effort is normal. No respiratory distress.     Breath sounds: Wheezing present.     Comments: Expiratory wheeze, speaks in full sentences without difficulty, hypoxic mid 80s Chest:     Comments: Nontender, no crepitus or step-off Abdominal:     General: Bowel sounds are normal. There is no distension.     Palpations: Abdomen is soft.     Comments: Soft, nontender  Musculoskeletal:        General: Normal range of motion.     Cervical back: Normal range of motion and neck supple.     Right lower leg: Tenderness present. Edema present.     Left lower leg: Edema present.     Comments: Pitting edema to knees bilaterally, right greater than left.  Some tenderness to his anterior right shin without fluctuance or induration.  No obvious open wounds  Skin:    General: Skin is warm and dry.     Capillary Refill: Capillary refill takes less than 2 seconds.  Neurological:      General: No focal deficit present.     Mental Status: He is alert and oriented to person, place, and time.     (all labs ordered are listed, but only abnormal results are displayed) Labs Reviewed  BASIC METABOLIC PANEL WITH GFR - Abnormal; Notable for the following components:      Result Value   CO2 33 (*)    Creatinine, Ser 0.60 (*)    All other components within normal limits  RESP PANEL BY RT-PCR (RSV, FLU A&B, COVID)  RVPGX2  CBC  PRO BRAIN NATRIURETIC PEPTIDE  TROPONIN T, HIGH  SENSITIVITY  TROPONIN T, HIGH SENSITIVITY    EKG: None  Radiology: CT Angio Chest PE W and/or Wo Contrast Result Date: 08/24/2024 EXAM: CTA of the Chest with contrast for PE 08/24/2024 08:35:53 PM TECHNIQUE: CTA of the chest was performed without and with the administration of 75 mL of intravenous iohexol  (OMNIPAQUE ) 350 MG/ML injection. Multiplanar reformatted images are provided for review. MIP images are provided for review. Automated exposure control, iterative reconstruction, and/or weight based adjustment of the mA/kV was utilized to reduce the radiation dose to as low as reasonably achievable. COMPARISON: 02/07/2022 CLINICAL HISTORY: Pulmonary embolism (PE) suspected, high prob. FINDINGS: PULMONARY ARTERIES: Pulmonary arteries are adequately opacified for evaluation. No pulmonary embolism. Main pulmonary artery is normal in caliber. MEDIASTINUM: Cardiomegaly. Scattered coronary artery atherosclerosis. Scattered aortic atherosclerosis. There is no acute abnormality of the thoracic aorta. The pericardium demonstrates no acute abnormality. LYMPH NODES: No mediastinal, hilar or axillary lymphadenopathy. LUNGS AND PLEURA: The lungs are without acute process. No focal consolidation or pulmonary edema. No pleural effusion or pneumothorax. UPPER ABDOMEN: Limited images of the upper abdomen are unremarkable. SOFT TISSUES AND BONES: No acute bone or soft tissue abnormality. IMPRESSION: 1. No pulmonary embolism. 2.  Cardiomegaly. 3. Scattered coronary artery and aortic atherosclerosis. 4. No acute cardiopulmonary disease. Electronically signed by: Franky Crease MD 08/24/2024 08:40 PM EST RP Workstation: [id number]   US  Venous Img Lower Unilateral Right Result Date: 08/24/2024 EXAM: ULTRASOUND DUPLEX OF THE RIGHT LOWER EXTREMITY VEINS TECHNIQUE: Duplex ultrasound using B-mode/gray scaled imaging and Doppler spectral analysis and color flow was obtained of the deep venous structures of the right lower extremity. COMPARISON: None available. CLINICAL HISTORY: Right lower leg swelling for 3 days. History of CHF and COPD. Pain, edema, and tobacco use. FINDINGS: The common femoral vein, femoral vein, popliteal vein, and posterior tibial vein of the right lower extremity demonstrate normal compressibility with normal color flow and spectral analysis. No evidence of acute deep venous thrombosis in the visualized lower extremity veins. 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 Vascular surgery referral is recommended. IMPRESSION: 1. No evidence of acute deep venous thrombosis in the visualized lower extremity veins. 2. 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 Electronically signed by: Elsie Gravely MD 08/24/2024 07:55 PM EST RP Workstation: [id number]   DG Tibia/Fibula Right Result Date: 08/24/2024 EXAM: 2 VIEW(S) XRAY OF THE RIGHT TIBIA AND FIBULA 08/24/2024 06:11:26 PM COMPARISON: 06/09/2024. CLINICAL HISTORY: swelling FINDINGS: BONES AND JOINTS: No acute fracture. No focal osseous lesion. No joint dislocation. Mild degenerative changes in the Right knee. Small plantar calcaneal spur. SOFT TISSUES: Soft tissue calcifications, likely phleboliths. Diffuse soft tissue edema, progressing since prior study. No focal soft tissue mass lesion is identified. Curvilinear  calcifications in the popliteal region may represent a popliteal artery aneurysm measuring up to 2.9 cm in diameter. Correlate with physical examination and consider ultrasound for further evaluation. IMPRESSION: 1. Diffuse soft tissue edema, progressed since prior study, without focal soft tissue mass identified. 2. Curvilinear calcifications in the popliteal region may represent a popliteal artery aneurysm measuring up to 2.9 cm; consider ultrasound for further evaluation. 3. No acute fracture or dislocation. Electronically signed by: Elsie Gravely MD 08/24/2024 06:20 PM EST RP Workstation: [id number]   DG Chest Port 1 View Result Date: 08/24/2024 EXAM: 1 VIEW(S) XRAY OF THE CHEST 08/24/2024 06:11:44 PM COMPARISON: 02/11/2022 CLINICAL HISTORY: SOB (shortness of breath); Cough FINDINGS: LUNGS  AND PLEURA: Shallow inspiration. Likely infiltration or atelectasis in the left lung base. Probable small left pleural effusion. The right lung is clear. No pneumothorax. HEART AND MEDIASTINUM: Cardiac enlargement. Mediastinal contours are intact. BONES AND SOFT TISSUES: Degenerative changes in the spine and shoulders. No acute osseous abnormality. IMPRESSION: 1. Likely infiltration or atelectasis in the left lung base. 2. Probable small left pleural effusion. 3. Cardiac enlargement. Electronically signed by: Elsie Gravely MD 08/24/2024 06:18 PM EST RP Workstation: [id number]     Procedures   Medications Ordered in the ED  methylPREDNISolone  sodium succinate (SOLU-MEDROL ) 125 mg/2 mL injection 125 mg (125 mg Intravenous Given 08/24/24 1901)  furosemide  (LASIX ) injection 20 mg (20 mg Intravenous Given 08/24/24 1901)  ipratropium-albuterol  (DUONEB) 0.5-2.5 (3) MG/3ML nebulizer solution 3 mL (3 mLs Nebulization Given 08/24/24 1953)  iohexol  (OMNIPAQUE ) 350 MG/ML injection 75 mL (75 mLs Intravenous Contrast Given 08/24/24 2036)  ipratropium-albuterol  (DUONEB) 0.5-2.5 (3) MG/3ML nebulizer solution 3 mL (3 mLs  Nebulization Given 08/24/24 6531)    58 year old multiple medical comorbidities here for evaluation of cough and shortness of breath as well as pain to his right leg.  He has a history of CHF, COPD, still uses tobacco.  URI symptoms over the last few days.  On arrival he is afebrile however was noted to be hypoxic.  He has diffuse expiratory wheeze however speaks in full sentences.  Abdomen soft, nontender.  He has pitting edema up to bilateral knees.  Some tenderness to his right anterior shin however no fluctuance or induration.  His compartments are soft.  Denies any prior history of PE or DVT.  Will treat multifactorial with Lasix , steroids, DuoNeb, plan labs, imaging  Labs and imaging personally viewed and interpreted:  CBC without leukocytosis Metabolic panel without significant abnormality Troponin less than 15 BNP less than 50--- could possibly be due to body habitus COVID, flu, RSV negative Chest x-ray with infiltrate versus atelectasis left lung base, small pleural effusion with cardiac enlargement Tib-fib without acute bony abnormality, showed possible popliteal artery aneurysm   Patient reassessed.  Order DVT ultrasound, discussed with ultrasound tech cannot perform arterial studies here in the ED.  Will need a CTA given his hypoxia, unilateral leg pain will add on CTA runoff to assess for aneurysm  Per CT difficulty with CTA chest and CT run off due to timing and position on table. Will need arterial US  outpatient  Patient reassessed. Upon entering the room patient nasal cannula off-- hypoxic into mid 80's. [place] placed back on patient. Lungs significant improved. Minor exp wheeze. Will give additional neb. Pending CTA chest.  CTA chest negative for PE, bacterial infectious process that showed cardiomegaly.  Patient reassessed.  Persistently hypoxic I suspect likely acute on chronic given his prior admission documentation.  Suspect likely multifactorial COPD exacerbation as well as  fluid overload.  Recommended admission.  Patient declined.  Discussed risk versus benefit including worsening condition, death.  Patient voiced understanding and refuses admission.  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  Patient states he has had a spot there for many years.  He has not followed with vascular for this previously.  He was given the contact information for vascular surgery.  We discussed the nature and purpose, risks and benefits, as well as, the alternatives of treatment. Time was given to allow the opportunity to ask questions and consider their options, and after the discussion, the patient decided to refuse the offerred treatment. The patient was informed that  refusal could lead to, but was not limited to, death, permanent disability, or severe pain. If present, I asked the relatives or significant others to dissuade them without success. Prior to refusing, I determined that the patient had the capacity to make their decision and understood the consequences of that decision. After refusal, I made every reasonable opportunity to treat them to the best of my ability.  The patient was notified that they may return to the emergency department at any time for further treatment.                                      Medical Decision Making Amount and/or Complexity of Data Reviewed External Data Reviewed: labs, radiology, ECG and notes. Labs: ordered. Decision-making details documented in ED Course. Radiology: ordered and independent interpretation performed. Decision-making details documented in ED Course. ECG/medicine tests: ordered and independent interpretation performed. Decision-making details documented in ED Course.  Risk OTC drugs. Prescription drug management. Decision regarding hospitalization. Diagnosis or treatment significantly limited by social determinants of health.        Final diagnoses:  Hypoxia  Other hypervolemia  COPD  exacerbation (HCC)  Popliteal artery aneurysm  Left against medical advice    ED Discharge Orders          Ordered    predniSONE  (DELTASONE ) 50 MG tablet  Daily        08/24/24 2123    furosemide  (LASIX ) 20 MG tablet  Daily        08/24/24 2123    ipratropium-albuterol  (DUONEB) 0.5-2.5 (3) MG/3ML SOLN  Every 2 hours PRN        08/24/24 2123    albuterol  (VENTOLIN  HFA) 108 (90 Base) MCG/ACT inhaler  Every 6 hours PRN        08/24/24 2123               Curtisha Bendix A, PA-C 08/24/24 2140    Lenor Hollering, MD 09/05/24 1457

## 2024-08-24 NOTE — Discharge Instructions (Addendum)
 We recommended admission to the hospital however you DECLINED.  You are Leaving AGAINST Medical Advise.  Follow-up with vascular surgery for your popliteal artery aneurysm  Please return if your symptoms worsen or you change you mind about admission
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
## 2024-08-24 NOTE — ED Notes (Signed)
 Pt educated on leaving AMA and risk associated with not completing the plan of care. Pt verbalized complete understanding and denies questions at this time.
